# Patient Record
Sex: Female | Born: 1950
Health system: Southern US, Community
[De-identification: ages and names within clinical notes are randomized; demographics above are authoritative.]

## PROBLEM LIST (undated history)

## (undated) DIAGNOSIS — I10 Essential (primary) hypertension: Secondary | ICD-10-CM

## (undated) DIAGNOSIS — H409 Unspecified glaucoma: Secondary | ICD-10-CM

## (undated) DIAGNOSIS — E119 Type 2 diabetes mellitus without complications: Secondary | ICD-10-CM

## (undated) HISTORY — PX: ABDOMINAL HYSTERECTOMY: SHX81

## (undated) HISTORY — PX: THYROIDECTOMY: SHX17

## (undated) HISTORY — PX: GLAUCOMA SURGERY: SHX656

---

## 2013-06-11 ENCOUNTER — Ambulatory Visit: Payer: Self-pay

## 2014-05-01 ENCOUNTER — Encounter (HOSPITAL_COMMUNITY): Payer: Self-pay | Admitting: Emergency Medicine

## 2014-05-01 ENCOUNTER — Emergency Department (HOSPITAL_COMMUNITY)
Admission: EM | Admit: 2014-05-01 | Discharge: 2014-05-02 | Disposition: A | Discharge status: Home / Self Care | Payer: Self-pay | Attending: Emergency Medicine | Admitting: Emergency Medicine

## 2014-05-01 ENCOUNTER — Emergency Department (HOSPITAL_COMMUNITY): Payer: Self-pay

## 2014-05-01 DIAGNOSIS — J40 Bronchitis, not specified as acute or chronic: Secondary | ICD-10-CM

## 2014-05-01 DIAGNOSIS — J209 Acute bronchitis, unspecified: Secondary | ICD-10-CM | POA: Insufficient documentation

## 2014-05-01 DIAGNOSIS — R05 Cough: Secondary | ICD-10-CM

## 2014-05-01 DIAGNOSIS — R0602 Shortness of breath: Secondary | ICD-10-CM

## 2014-05-01 DIAGNOSIS — I10 Essential (primary) hypertension: Secondary | ICD-10-CM | POA: Insufficient documentation

## 2014-05-01 DIAGNOSIS — R059 Cough, unspecified: Secondary | ICD-10-CM

## 2014-05-01 DIAGNOSIS — Z8669 Personal history of other diseases of the nervous system and sense organs: Secondary | ICD-10-CM | POA: Insufficient documentation

## 2014-05-01 DIAGNOSIS — E119 Type 2 diabetes mellitus without complications: Secondary | ICD-10-CM | POA: Insufficient documentation

## 2014-05-01 HISTORY — DX: Type 2 diabetes mellitus without complications: E11.9

## 2014-05-01 HISTORY — DX: Essential (primary) hypertension: I10

## 2014-05-01 HISTORY — DX: Unspecified glaucoma: H40.9

## 2014-05-01 LAB — COMPREHENSIVE METABOLIC PANEL
ALT: 16 U/L (ref 0–35)
ANION GAP: 15 (ref 5–15)
AST: 23 U/L (ref 0–37)
Albumin: 4.1 g/dL (ref 3.5–5.2)
Alkaline Phosphatase: 74 U/L (ref 39–117)
BUN: 23 mg/dL (ref 6–23)
CALCIUM: 8.4 mg/dL (ref 8.4–10.5)
CHLORIDE: 101 mmol/L (ref 96–112)
CO2: 20 mmol/L (ref 19–32)
CREATININE: 1.08 mg/dL (ref 0.50–1.10)
GFR calc Af Amer: 61 mL/min — ABNORMAL LOW (ref >90)
GFR, EST NON AFRICAN AMERICAN: 53 mL/min — AB (ref >90)
Glucose, Bld: 108 mg/dL — ABNORMAL HIGH (ref 70–99)
POTASSIUM: 3.4 mmol/L — AB (ref 3.5–5.1)
SODIUM: 136 mmol/L (ref 135–145)
TOTAL PROTEIN: 8.7 g/dL — AB (ref 6.0–8.3)
Total Bilirubin: 0.5 mg/dL (ref 0.3–1.2)

## 2014-05-01 LAB — CBC WITH DIFFERENTIAL/PLATELET
Basophils Absolute: 0 10*3/uL (ref 0.0–0.1)
Basophils Relative: 0 % (ref 0–1)
Eosinophils Absolute: 0.1 10*3/uL (ref 0.0–0.7)
Eosinophils Relative: 1 % (ref 0–5)
HEMATOCRIT: 36.2 % (ref 36.0–46.0)
Hemoglobin: 12.5 g/dL (ref 12.0–15.0)
LYMPHS PCT: 23 % (ref 12–46)
Lymphs Abs: 1.8 10*3/uL (ref 0.7–4.0)
MCH: 31.3 pg (ref 26.0–34.0)
MCHC: 34.5 g/dL (ref 30.0–36.0)
MCV: 90.7 fL (ref 78.0–100.0)
MONO ABS: 0.5 10*3/uL (ref 0.1–1.0)
MONOS PCT: 7 % (ref 3–12)
Neutro Abs: 5.3 10*3/uL (ref 1.7–7.7)
Neutrophils Relative %: 69 % (ref 43–77)
Platelets: 241 10*3/uL (ref 150–400)
RBC: 3.99 MIL/uL (ref 3.87–5.11)
RDW: 13.3 % (ref 11.5–15.5)
WBC: 7.7 10*3/uL (ref 4.0–10.5)

## 2014-05-01 LAB — I-STAT TROPONIN, ED: Troponin i, poc: 0 ng/mL (ref 0.00–0.08)

## 2014-05-01 LAB — URINALYSIS, ROUTINE W REFLEX MICROSCOPIC
BILIRUBIN URINE: NEGATIVE
Glucose, UA: NEGATIVE mg/dL
KETONES UR: NEGATIVE mg/dL
NITRITE: NEGATIVE
PH: 5.5 (ref 5.0–8.0)
PROTEIN: NEGATIVE mg/dL
Specific Gravity, Urine: 1.007 (ref 1.005–1.030)
Urobilinogen, UA: 0.2 mg/dL (ref 0.0–1.0)

## 2014-05-01 LAB — D-DIMER, QUANTITATIVE (NOT AT ARMC): D-Dimer, Quant: 1.36 ug/mL-FEU — ABNORMAL HIGH (ref 0.00–0.48)

## 2014-05-01 LAB — URINE MICROSCOPIC-ADD ON

## 2014-05-01 MED ORDER — ACETAMINOPHEN 325 MG PO TABS
650.0000 mg | ORAL_TABLET | Freq: Once | ORAL | Status: AC
  Administered 2014-05-01: 650 mg via ORAL
  Filled 2014-05-01: qty 2

## 2014-05-01 NOTE — ED Provider Notes (Signed)
CSN: [id number]     Arrival date & time 05/01/14  1906 History   First MD Initiated Contact with Patient 05/01/14 2008     Chief Complaint  Patient presents with  . Cough  . Shortness of Breath     (Consider location/radiation/quality/duration/timing/severity/associated sxs/prior Treatment) HPI Comments: Patient is a 64 year old female with a past medical history of hypertension and diabetes who presents with chest pain that started yesterday morning. The symptoms started gradually and remained constant since the onset. The pain was sharp and severe and located in her left upper chest and right lower chest and did not radiate. Patient reports the pain lasted all day until she took Naprosyn which provided relief. She reports associated productive cough with white sputum and SOB. She reports the pain has improved today but her SOB has worsened. She denies fever or other associated symptoms. No history of blood clot. No recent travel, surgery, or immobilization. No aggravating/alleviating factors. Translator phone used to communicate with patient, she speaks Arabic.    Past Medical History  Diagnosis Date  . Hypertension   . Diabetes mellitus without complication   . Glaucoma    Past Surgical History  Procedure Laterality Date  . Glaucoma surgery    . Thyroidectomy     No family history on file. History  Substance Use Topics  . Smoking status: Never Smoker   . Smokeless tobacco: Not on file  . Alcohol Use: No   OB History    No data available     Review of Systems  Constitutional: Negative for fever, chills and fatigue.  HENT: Negative for trouble swallowing.   Eyes: Negative for visual disturbance.  Respiratory: Positive for cough and shortness of breath.   Cardiovascular: Positive for chest pain. Negative for palpitations.  Gastrointestinal: Negative for nausea, vomiting, abdominal pain and diarrhea.  Genitourinary: Negative for dysuria and difficulty urinating.   Musculoskeletal: Negative for arthralgias and neck pain.  Skin: Negative for color change.  Neurological: Negative for dizziness and weakness.  Psychiatric/Behavioral: Negative for dysphoric mood.      Allergies  Review of patient's allergies indicates no known allergies.  Home Medications   Prior to Admission medications   Not on File   BP 165/84 mmHg  Pulse 93  Temp(Src) 98.3 F (36.8 C) (Oral)  Resp 16  SpO2 99% Physical Exam  Constitutional: She is oriented to person, place, and time. She appears well-developed and well-nourished. No distress.  HENT:  Head: Normocephalic and atraumatic.  Eyes: Conjunctivae and EOM are normal.  Neck: Normal range of motion.  Cardiovascular: Normal rate and regular rhythm.  Exam reveals no gallop and no friction rub.   No murmur heard. No lower extremity edema or calf tenderness to palpation.   Pulmonary/Chest: Effort normal and breath sounds normal. She has no wheezes. She has no rales. She exhibits no tenderness.  Abdominal: Soft. She exhibits no distension. There is no tenderness. There is no rebound.  Musculoskeletal: Normal range of motion.  Neurological: She is alert and oriented to person, place, and time. Coordination normal.  Speech is goal-oriented. Moves limbs without ataxia.   Skin: Skin is warm and dry.  Psychiatric: She has a normal mood and affect. Her behavior is normal.  Nursing note and vitals reviewed.   ED Course  Procedures (including critical care time) Labs Review Labs Reviewed  COMPREHENSIVE METABOLIC PANEL - Abnormal; Notable for the following:    Potassium 3.4 (*)    Glucose, Bld 108 (*)  Total Protein 8.7 (*)    GFR calc non Af Amer 53 (*)    GFR calc Af Amer 61 (*)    All other components within normal limits  URINALYSIS, ROUTINE W REFLEX MICROSCOPIC - Abnormal; Notable for the following:    Hgb urine dipstick MODERATE (*)    Leukocytes, UA MODERATE (*)    All other components within normal  limits  D-DIMER, QUANTITATIVE - Abnormal; Notable for the following:    D-Dimer, Quant 1.36 (*)    All other components within normal limits  CBC WITH DIFFERENTIAL/PLATELET  URINE MICROSCOPIC-ADD ON  Rosezena SensorI-STAT TROPOININ, ED    Imaging Review Dg Chest 2 View  05/01/2014   CLINICAL DATA:  Two week history of cough. Two day history of shortness of breath. Left upper chest pain which began earlier today. Current history of diabetes.  EXAM: CHEST  2 VIEW  COMPARISON:  None.  FINDINGS: Cardiomediastinal silhouette unremarkable. Lungs clear. Bronchovascular markings normal. Pulmonary vascularity normal. No visible pleural effusions. No pneumothorax. Mild degenerative changes involving the thoracic spine. Eventration of the right anterior hemidiaphragm.  IMPRESSION: No acute cardiopulmonary disease.   Electronically Signed   By: Hulan Saashomas  Lawrence M.D.   On: 05/01/2014 21:23   Ct Angio Chest Pe W/cm &/or Wo Cm  05/02/2014   CLINICAL DATA:  Acute onset of shortness of breath and productive cough. Initial encounter.  EXAM: CT ANGIOGRAPHY CHEST WITH CONTRAST  TECHNIQUE: Multidetector CT imaging of the chest was performed using the standard protocol during bolus administration of intravenous contrast. Multiplanar CT image reconstructions and MIPs were obtained to evaluate the vascular anatomy.  CONTRAST:  100mL OMNIPAQUE IOHEXOL 350 MG/ML SOLN  COMPARISON:  Chest radiograph performed performed earlier today at 8:44 p.m.  FINDINGS: There is no evidence of pulmonary embolus.  Minimal bibasilar atelectasis is noted. The lungs are otherwise clear. There is no evidence of significant focal consolidation, pleural effusion or pneumothorax. No masses are identified; no abnormal focal contrast enhancement is seen.  The mediastinum is unremarkable in appearance. No mediastinal lymphadenopathy is seen. No pericardial effusions identified. The great vessels are grossly unremarkable in appearance. No axillary lymphadenopathy is  seen. The visualized thyroid gland is unremarkable in appearance.  A tiny hiatal hernia is noted.  The liver and spleen are unremarkable. The visualized portions of the pancreas, gallbladder, stomach, adrenal glands and kidneys are within normal limits.  No acute osseous abnormalities are seen.  Review of the MIP images confirms the above findings.  IMPRESSION: 1. No evidence of pulmonary embolus. 2. Minimal bibasilar atelectasis noted; lungs otherwise clear. 3. Tiny hiatal hernia noted.   Electronically Signed   By: Roanna RaiderJeffery  Chang M.D.   On: 05/02/2014 01:08     EKG Interpretation None      MDM   Final diagnoses:  Bronchitis    8:39 PM Labs and xray pending. Vitals stable and patient afebrile. Patient consistently tachycardic while I was in the room, although her EKG shows rate of 94.   1:29 AM Patient's CT chest shows no PE. Patient will be treated for bronchitis due to age. I doubt ACS based on the patient's history. Vitals stable and patient afebrile. No further evaluation needed at this time.   Emilia BeckKaitlyn Jetaime Pinnix, PA-C 05/02/14 0129  Blake DivineJohn Wofford, MD 05/03/14 [phone]

## 2014-05-01 NOTE — ED Notes (Signed)
Spoke with burt with CT, patient has IV placed, ready for transport.

## 2014-05-01 NOTE — ED Notes (Signed)
Kaitlyn, PA-C, at the bedside.  

## 2014-05-01 NOTE — ED Notes (Signed)
Phlebotomy at the bedside  

## 2014-05-01 NOTE — ED Notes (Signed)
Pt. reports SOB with productive cough onset this week , denies fever or chills.

## 2014-05-01 NOTE — ED Notes (Signed)
Kaitlyn, PA-C, allows patient to take calcium pill from home if they request. Relayed to the patient, still awaiting CT scan.

## 2014-05-01 NOTE — ED Notes (Signed)
Interpreter line used, patient speaks arabic and is from [place].

## 2014-05-01 NOTE — ED Notes (Signed)
Pt to xray

## 2014-05-02 ENCOUNTER — Encounter (HOSPITAL_COMMUNITY): Payer: Self-pay | Admitting: Radiology

## 2014-05-02 ENCOUNTER — Emergency Department (HOSPITAL_COMMUNITY): Payer: Self-pay

## 2014-05-02 MED ORDER — IOHEXOL 350 MG/ML SOLN
100.0000 mL | Freq: Once | INTRAVENOUS | Status: AC | PRN
  Administered 2014-05-02: 100 mL via INTRAVENOUS

## 2014-05-02 MED ORDER — DEXTROMETHORPHAN POLISTIREX ER 30 MG/5ML PO SUER: 30.0000 mg | ORAL | Status: DC | PRN

## 2014-05-02 MED ORDER — AZITHROMYCIN 250 MG PO TABS: 250.0000 mg | ORAL_TABLET | Freq: Every day | ORAL | Status: DC

## 2014-05-02 NOTE — Discharge Instructions (Signed)
Take azithromycin as directed until gone. Take delsym as needed for cough. Refer to attached documents for more information.  °

## 2014-05-02 NOTE — ED Notes (Signed)
Called Ct, spoke to burt, patient is next for transport to Ct.

## 2014-10-16 ENCOUNTER — Emergency Department (HOSPITAL_COMMUNITY)
Admission: EM | Admit: 2014-10-16 | Discharge: 2014-10-16 | Disposition: A | Discharge status: Home / Self Care | Payer: No Typology Code available for payment source | Source: Home / Self Care

## 2014-10-16 ENCOUNTER — Encounter (HOSPITAL_COMMUNITY): Payer: Self-pay | Admitting: *Deleted

## 2014-10-16 DIAGNOSIS — E034 Atrophy of thyroid (acquired): Secondary | ICD-10-CM

## 2014-10-16 DIAGNOSIS — E038 Other specified hypothyroidism: Secondary | ICD-10-CM

## 2014-10-16 LAB — POCT I-STAT, CHEM 8
BUN: 24 mg/dL — AB (ref 6–20)
Calcium, Ion: 0.64 mmol/L — CL (ref 1.13–1.30)
Chloride: 101 mmol/L (ref 101–111)
Creatinine, Ser: 1 mg/dL (ref 0.44–1.00)
Glucose, Bld: 137 mg/dL — ABNORMAL HIGH (ref 65–99)
HEMATOCRIT: 43 % (ref 36.0–46.0)
Hemoglobin: 14.6 g/dL (ref 12.0–15.0)
POTASSIUM: 3.7 mmol/L (ref 3.5–5.1)
Sodium: 138 mmol/L (ref 135–145)
TCO2: 22 mmol/L (ref 0–100)

## 2014-10-16 LAB — POCT URINALYSIS DIP (DEVICE)
BILIRUBIN URINE: NEGATIVE
GLUCOSE, UA: NEGATIVE mg/dL
Ketones, ur: NEGATIVE mg/dL
Nitrite: NEGATIVE
Protein, ur: NEGATIVE mg/dL
Specific Gravity, Urine: 1.01 (ref 1.005–1.030)
UROBILINOGEN UA: 0.2 mg/dL (ref 0.0–1.0)
pH: 7 (ref 5.0–8.0)

## 2014-10-16 NOTE — Discharge Instructions (Signed)

## 2014-10-16 NOTE — ED Notes (Signed)
Pt   Reports  Symptoms of   weakness   Discomfort  On urination    Weakness    With  Symptoms  Since  Yesterday          Pt is  A  Diabetic  Takes  meds  For  Same

## 2014-10-16 NOTE — ED Notes (Signed)
Patient can't void at this time 

## 2014-10-16 NOTE — ED Provider Notes (Signed)
CSN: [id number]     Arrival date & time 10/16/14  1408 History   None    Chief Complaint  Patient presents with  . Fatigue   (Consider location/radiation/quality/duration/timing/severity/associated sxs/prior Treatment) HPI Comments: 64 year old [place] female presents to urgent care complaining of fatigue. She has had gradually decreasing energy for the last 2 weeks. Beginning at that time she had been treated for urinary tract infection after which she felt very tired. Dysuria returned and she was seen at another urgent care facility a few days ago at which time she was prescribed Cipro for presumed urinary tract infection. However urine culture growth was negative and she was instructed to stop taking her antibiotics. Now she complains of body aches and extreme fatigue. She denies seeing blood in her urine. She admits to increased urinary frequency.  The history is provided by a friend. The history is limited by a language barrier. A language interpreter was used (Friend speaks [place] (daughter-in-law?)).    Past Medical History  Diagnosis Date  . Hypertension   . Diabetes mellitus without complication (HCC)   . Glaucoma    Past Surgical History  Procedure Laterality Date  . Glaucoma surgery    . Thyroidectomy     History reviewed. No pertinent family history. Social History  Substance Use Topics  . Smoking status: Never Smoker   . Smokeless tobacco: None  . Alcohol Use: No   OB History    No data available     Review of Systems  Constitutional: Positive for fatigue. Negative for fever.  Respiratory: Negative for cough and shortness of breath.   Cardiovascular: Negative for chest pain.  Gastrointestinal: Positive for nausea and abdominal pain. Negative for vomiting.  Genitourinary: Positive for dysuria and frequency.  Musculoskeletal: Positive for myalgias.    Allergies  Review of patient's allergies indicates no known allergies.  Home Medications   Prior to  Admission medications   Medication Sig Start Date End Date Taking? Authorizing Provider  amLODipine (NORVASC) 2.5 MG tablet Take 2.5 mg by mouth daily.    Historical Provider, MD  azithromycin (ZITHROMAX Z-PAK) 250 MG tablet Take 1 tablet (250 mg total) by mouth daily. 500mg  PO day 1, then 250mg  PO days 205 05/02/14   Kaitlyn Szekalski, PA-C  brimonidine (ALPHAGAN) 0.2 % ophthalmic solution Place 1 drop into both eyes 3 (three) times daily.    Historical Provider, MD  dextromethorphan (DELSYM) 30 MG/5ML liquid Take 5 mLs (30 mg total) by mouth as needed for cough. 05/02/14   Kaitlyn Szekalski, PA-C  dorzolamide-timolol (COSOPT) 22.3-6.8 MG/ML ophthalmic solution Place 1 drop into both eyes 2 (two) times daily.    Historical Provider, MD  glimepiride (AMARYL) 2 MG tablet Take 2 mg by mouth 2 (two) times daily.    Historical Provider, MD  levothyroxine (SYNTHROID, LEVOTHROID) 100 MCG tablet Take 100 mcg by mouth daily before breakfast.    Historical Provider, MD  metFORMIN (GLUCOPHAGE) 500 MG tablet Take by mouth 2 (two) times daily with a meal.    Historical Provider, MD  naproxen (NAPROSYN) 500 MG tablet Take 500 mg by mouth 2 (two) times daily as needed (with food).    Historical Provider, MD  NON FORMULARY Take 1 tablet by mouth 2 (two) times daily. Type of Calcium not from our country    Historical Provider, MD  NON FORMULARY Take 1 tablet by mouth daily. Osteo-Alfa 0.25  Alfacalcidol     Not from our country    Historical Provider, MD  Meds Ordered and Administered this Visit  Medications - No data to display  BP 137/83 mmHg  Pulse 88  Temp(Src) 98.5 F (36.9 C) (Oral)  Resp 16  SpO2 98% No data found.   Physical Exam  Constitutional: She is oriented to person, place, and time. She appears well-developed and well-nourished. No distress.  HENT:  Head: Normocephalic and atraumatic.  Mouth/Throat: Oropharynx is clear and moist.  Eyes: Conjunctivae and EOM are normal. Pupils are equal,  round, and reactive to light. No scleral icterus.  Neck: Normal range of motion. Neck supple. No thyromegaly (well-healed anterior neck scar above cricroid notch) present.  Cardiovascular: Normal rate, regular rhythm and normal heart sounds.  Exam reveals no gallop and no friction rub.   No murmur heard. Pulmonary/Chest: Effort normal and breath sounds normal.  Abdominal: Soft. Bowel sounds are normal. She exhibits no distension. There is no tenderness.  Musculoskeletal: Normal range of motion. She exhibits edema.  Lymphadenopathy:    She has no cervical adenopathy.  Neurological: She is alert and oriented to person, place, and time.  Skin: Skin is warm and dry. No rash noted. No erythema.  Psychiatric: She has a normal mood and affect. Her behavior is normal. Judgment and thought content normal.    ED Course  Procedures (including critical care time)  Labs Review Labs Reviewed  POCT URINALYSIS DIP (DEVICE) - Abnormal; Notable for the following:    Hgb urine dipstick MODERATE (*)    Leukocytes, UA TRACE (*)    All other components within normal limits  POCT I-STAT, CHEM 8 - Abnormal; Notable for the following:    BUN 24 (*)    Glucose, Bld 137 (*)    Calcium, Ion 0.64 (*)    All other components within normal limits    Imaging Review No results found.   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 blood sugar is normal per chem 8. No physical indication of anemia. (G6PD exacerbation following Bactrim per the differential as there are a few blood cells in urine over there is no indication of bilirubin in urine suggesting hemolysis). Most likely etiology is hypothyroidism. Patient reports compliance with Synthroid  however she could have a change in thyroglobulin or total albumin. The patient reports that she has an appointment with her primary care doctor tomorrow. It appears the office is [facility]. I will hand the patient contact information for care management here at urgent care to follow-up. Discharge instructions to assess thyroid function.    Arnaldo Natal, MD 10/16/14 [phone]

## 2014-10-30 ENCOUNTER — Emergency Department (HOSPITAL_COMMUNITY): Payer: No Typology Code available for payment source

## 2014-10-30 ENCOUNTER — Emergency Department (HOSPITAL_COMMUNITY)
Admission: EM | Admit: 2014-10-30 | Discharge: 2014-10-30 | Disposition: A | Discharge status: Home / Self Care | Payer: Self-pay | Attending: Emergency Medicine | Admitting: Emergency Medicine

## 2014-10-30 ENCOUNTER — Encounter (HOSPITAL_COMMUNITY): Payer: Self-pay | Admitting: *Deleted

## 2014-10-30 DIAGNOSIS — Z79899 Other long term (current) drug therapy: Secondary | ICD-10-CM | POA: Insufficient documentation

## 2014-10-30 DIAGNOSIS — R109 Unspecified abdominal pain: Secondary | ICD-10-CM

## 2014-10-30 DIAGNOSIS — E119 Type 2 diabetes mellitus without complications: Secondary | ICD-10-CM | POA: Insufficient documentation

## 2014-10-30 DIAGNOSIS — Z87440 Personal history of urinary (tract) infections: Secondary | ICD-10-CM | POA: Insufficient documentation

## 2014-10-30 DIAGNOSIS — R1030 Lower abdominal pain, unspecified: Secondary | ICD-10-CM | POA: Insufficient documentation

## 2014-10-30 DIAGNOSIS — Z8669 Personal history of other diseases of the nervous system and sense organs: Secondary | ICD-10-CM | POA: Insufficient documentation

## 2014-10-30 DIAGNOSIS — I10 Essential (primary) hypertension: Secondary | ICD-10-CM | POA: Insufficient documentation

## 2014-10-30 DIAGNOSIS — R300 Dysuria: Secondary | ICD-10-CM | POA: Insufficient documentation

## 2014-10-30 DIAGNOSIS — Z7984 Long term (current) use of oral hypoglycemic drugs: Secondary | ICD-10-CM | POA: Insufficient documentation

## 2014-10-30 LAB — URINALYSIS, ROUTINE W REFLEX MICROSCOPIC
Bilirubin Urine: NEGATIVE
GLUCOSE, UA: NEGATIVE mg/dL
Ketones, ur: NEGATIVE mg/dL
Nitrite: NEGATIVE
PH: 7 (ref 5.0–8.0)
Protein, ur: NEGATIVE mg/dL
SPECIFIC GRAVITY, URINE: 1.004 — AB (ref 1.005–1.030)
UROBILINOGEN UA: 0.2 mg/dL (ref 0.0–1.0)

## 2014-10-30 LAB — COMPREHENSIVE METABOLIC PANEL
ALBUMIN: 3.4 g/dL — AB (ref 3.5–5.0)
ALK PHOS: 77 U/L (ref 38–126)
ALT: 14 U/L (ref 14–54)
AST: 23 U/L (ref 15–41)
Anion gap: 14 (ref 5–15)
BILIRUBIN TOTAL: 0.6 mg/dL (ref 0.3–1.2)
BUN: 19 mg/dL (ref 6–20)
CALCIUM: 5.9 mg/dL — AB (ref 8.9–10.3)
CO2: 23 mmol/L (ref 22–32)
CREATININE: 0.92 mg/dL (ref 0.44–1.00)
Chloride: 101 mmol/L (ref 101–111)
GFR calc Af Amer: >60 mL/min (ref >60)
GLUCOSE: 157 mg/dL — AB (ref 65–99)
POTASSIUM: 4.2 mmol/L (ref 3.5–5.1)
Sodium: 138 mmol/L (ref 135–145)
TOTAL PROTEIN: 8 g/dL (ref 6.5–8.1)

## 2014-10-30 LAB — URINE MICROSCOPIC-ADD ON

## 2014-10-30 LAB — CBC
HEMATOCRIT: 38.5 % (ref 36.0–46.0)
HEMOGLOBIN: 13.1 g/dL (ref 12.0–15.0)
MCH: 31.8 pg (ref 26.0–34.0)
MCHC: 34 g/dL (ref 30.0–36.0)
MCV: 93.4 fL (ref 78.0–100.0)
Platelets: 249 10*3/uL (ref 150–400)
RBC: 4.12 MIL/uL (ref 3.87–5.11)
RDW: 13.5 % (ref 11.5–15.5)
WBC: 6.7 10*3/uL (ref 4.0–10.5)

## 2014-10-30 LAB — LIPASE, BLOOD: LIPASE: 28 U/L (ref 11–51)

## 2014-10-30 LAB — CBG MONITORING, ED: GLUCOSE-CAPILLARY: 91 mg/dL (ref 65–99)

## 2014-10-30 MED ORDER — IOHEXOL 300 MG/ML  SOLN
100.0000 mL | Freq: Once | INTRAMUSCULAR | Status: AC | PRN
  Administered 2014-10-30: 100 mL via INTRAVENOUS

## 2014-10-30 MED ORDER — MORPHINE SULFATE (PF) 4 MG/ML IV SOLN
4.0000 mg | Freq: Once | INTRAVENOUS | Status: AC
  Administered 2014-10-30: 4 mg via INTRAVENOUS
  Filled 2014-10-30: qty 1

## 2014-10-30 NOTE — ED Notes (Signed)
Interpretor phone used. Arabic, [place]. Patient with intermittent lower abdominal pain x 2 weeks associated with fevers, headache, and painful urination.

## 2014-10-30 NOTE — ED Notes (Signed)
MD at bedside. 

## 2014-10-30 NOTE — ED Provider Notes (Signed)
  Physical Exam  BP 127/82 mmHg  Pulse 78  Temp(Src) 98.9 F (37.2 C) (Oral)  Resp 14  SpO2 99%  Physical Exam  ED Course  Procedures  MDM Patients CT is negative within limits. Patient has tolerated orals and feels somewhat better. Will discharge home.      Norma CoreNathan Amaree Leeper, MD 10/30/14 [phone]

## 2014-10-30 NOTE — ED Provider Notes (Signed)
CSN: [id number]     Arrival date & time 10/30/14  1235 History   First MD Initiated Contact with Patient 10/30/14 1531     Chief Complaint  Patient presents with  . Abdominal Pain  . Dysuria   patient speaks no [place]. History is obtained from [employer] using a professional interpreter (Consider location/radiation/quality/duration/timing/severity/associated sxs/prior Treatment) HPI Complains of left flank pain radiating to the lower abdomen onset one month ago. She was treated for urinary tract infection with a sulfa antibiotic, without relief. She occasionally takes ibuprofen with partial relief. She denies f She denies fever denies nausea or vomiting. Nothing makes symptoms better or worse however she states pain "comes and goes. Pain is moderate at present. Other associated symptoms :She does admit to dysuria denies headache no other associated symptoms Past Medical History  Diagnosis Date  . Hypertension   . Diabetes mellitus without complication (HCC)   . Glaucoma    Past Surgical History  Procedure Laterality Date  . Glaucoma surgery    . Thyroidectomy     History reviewed. No pertinent family history. Social History  Substance Use Topics  . Smoking status: Never Smoker   . Smokeless tobacco: None  . Alcohol Use: No   OB History    No data available     Review of Systems  Constitutional: Negative.   HENT: Negative.   Respiratory: Negative.   Cardiovascular: Negative.   Gastrointestinal: Positive for abdominal pain.  Genitourinary: Positive for flank pain.  Musculoskeletal: Negative.   Skin: Negative.   Neurological: Negative.   Psychiatric/Behavioral: Negative.   All other systems reviewed and are negative.     Allergies  Review of patient's allergies indicates no known allergies.  Home Medications   Prior to Admission medications   Medication Sig Start Date End Date Taking? Authorizing Provider  amLODipine (NORVASC) 2.5 MG tablet Take 2.5  mg by mouth daily.   Yes Historical Provider, MD  brimonidine (ALPHAGAN) 0.2 % ophthalmic solution Place 1 drop into both eyes 3 (three) times daily.   Yes Historical Provider, MD  dorzolamide-timolol (COSOPT) 22.3-6.8 MG/ML ophthalmic solution Place 1 drop into both eyes 2 (two) times daily.   Yes Historical Provider, MD  glimepiride (AMARYL) 2 MG tablet Take 2 mg by mouth 2 (two) times daily.   Yes Historical Provider, MD  levothyroxine (SYNTHROID, LEVOTHROID) 100 MCG tablet Take 100 mcg by mouth daily before breakfast.   Yes Historical Provider, MD  NON FORMULARY Take 1 tablet by mouth 2 (two) times daily. Type of Calcium not from our country   Yes Historical Provider, MD  NON FORMULARY Take 1 tablet by mouth daily. Osteo-Alfa 0.25  Alfacalcidol     Not from our country   Yes Historical Provider, MD  azithromycin (ZITHROMAX Z-PAK) 250 MG tablet Take 1 tablet (250 mg total) by mouth daily.  PO day 1, then  PO days 205 Patient not taking: Reported on 10/30/2014 05/02/14   Emilia Beck, PA-C  dextromethorphan (DELSYM) 30 MG/5ML liquid Take 5 mLs (30 mg total) by mouth as needed for cough. Patient not taking: Reported on 10/30/2014 05/02/14   Emilia Beck, PA-C  naproxen (NAPROSYN) 500 MG tablet Take 500 mg by mouth 2 (two) times daily as needed (with food).    Historical Provider, MD   BP 134/81 mmHg  Pulse 81  Temp(Src) 98.9 F (37.2 C) (Oral)  Resp 16  SpO2 98% Physical Exam  Constitutional: She appears well-developed and well-nourished. No distress.  HENT:  Head: Normocephalic and atraumatic.  Eyes: Conjunctivae are normal. Pupils are equal, round, and reactive to light.  Neck: Neck supple. No tracheal deviation present. No thyromegaly present.  Cardiovascular: Normal rate and regular rhythm.   No murmur heard. Pulmonary/Chest: Effort normal and breath sounds normal.  Abdominal: Soft. Bowel sounds are normal. She exhibits no distension. There is no tenderness.   Genitourinary:  Left flank tenderness  Musculoskeletal: Normal range of motion. She exhibits no edema or tenderness.  Neurological: She is alert. Coordination normal.  Skin: Skin is warm and dry. No rash noted.  Psychiatric: She has a normal mood and affect.  Nursing note and vitals reviewed.   ED Course  Procedures (including critical care time) Labs Review Labs Reviewed  COMPREHENSIVE METABOLIC PANEL - Abnormal; Notable for the following:    Glucose, Bld 157 (*)    Calcium 5.9 (*)    Albumin 3.4 (*)    All other components within normal limits  URINALYSIS, ROUTINE W REFLEX MICROSCOPIC (NOT AT [facility]) - Abnormal; Notable for the following:    Specific Gravity, Urine 1.004 (*)    Hgb urine dipstick MODERATE (*)    Leukocytes, UA TRACE (*)    All other components within normal limits  URINE CULTURE  CBC  LIPASE, BLOOD  URINE MICROSCOPIC-ADD ON    Imaging Review No results found. I have personally reviewed and evaluated these images and lab results as part of my medical decision-making.   EKG Interpretation None     5:20 PM pain is improved after treatment with intravenous morphine.\ Results for orders placed or performed during the hospital encounter of 10/30/14  Comprehensive metabolic panel  Result Value Ref Range   Sodium 138 135 - 145 mmol/L   Potassium 4.2 3.5 - 5.1 mmol/L   Chloride 101 101 - 111 mmol/L   CO2 23 22 - 32 mmol/L   Glucose, Bld 157 (H) 65 - 99 mg/dL   BUN 19 6 - 20 mg/dL   Creatinine, Ser 1.610.92 0.44 - 1.00 mg/dL   Calcium 5.9 (LL) 8.9 - 10.3 mg/dL   Total Protein 8.0 6.5 - 8.1 g/dL   Albumin 3.4 (L) 3.5 - 5.0 g/dL   AST 23 15 - 41 U/L   ALT 14 14 - 54 U/L   Alkaline Phosphatase 77 38 - 126 U/L   Total Bilirubin 0.6 0.3 - 1.2 mg/dL   GFR calc non Af Amer >60 >60 mL/min   GFR calc Af Amer >60 >60 mL/min   Anion gap 14 5 - 15  CBC  Result Value Ref Range   WBC 6.7 4.0 - 10.5 K/uL   RBC 4.12 3.87 - 5.11 MIL/uL   Hemoglobin 13.1 12.0 - 15.0  g/dL   HCT 09.638.5 04.536.0 - 40.946.0 %   MCV 93.4 78.0 - 100.0 fL   MCH 31.8 26.0 - 34.0 pg   MCHC 34.0 30.0 - 36.0 g/dL   RDW 81.113.5 91.411.5 - 78.215.5 %   Platelets 249 150 - 400 K/uL  Urinalysis, Routine w reflex microscopic (not at [facility])  Result Value Ref Range   Color, Urine YELLOW YELLOW   APPearance CLEAR CLEAR   Specific Gravity, Urine 1.004 (L) 1.005 - 1.030   pH 7.0 5.0 - 8.0   Glucose, UA NEGATIVE NEGATIVE mg/dL   Hgb urine dipstick MODERATE (A) NEGATIVE   Bilirubin Urine NEGATIVE NEGATIVE   Ketones, ur NEGATIVE NEGATIVE mg/dL   Protein, ur NEGATIVE NEGATIVE mg/dL   Urobilinogen, UA 0.2 0.0 -  1.0 mg/dL   Nitrite NEGATIVE NEGATIVE   Leukocytes, UA TRACE (A) NEGATIVE  Lipase, blood  Result Value Ref Range   Lipase 28 11 - 51 U/L  Urine microscopic-add on  Result Value Ref Range   Squamous Epithelial / LPF RARE RARE   WBC, UA 0-2 <3 WBC/hpf   RBC / HPF 3-6 <3 RBC/hpf  CBG monitoring, ED  Result Value Ref Range   Glucose-Capillary 91 65 - 99 mg/dL   No results found.  MDM  Patient signed out to Dr. Rubin Payor 5:25 PM Dx #1 left flank pain #2 low abdominal pain #3 dysuria #4 microscopic hematuria #5 hyperglycemia Final diagnoses:  None        Doug Sou, MD 10/30/14 1730

## 2014-10-30 NOTE — Discharge Instructions (Signed)
Flank Pain °Flank pain refers to pain that is located on the side of the body between the upper abdomen and the back. The pain may occur over a short period of time (acute) or may be long-term or reoccurring (chronic). It may be mild or severe. Flank pain can be caused by many things. °CAUSES  °Some of the more common causes of flank pain include: °· Muscle strains.   °· Muscle spasms.   °· A disease of your spine (vertebral disk disease).   °· A lung infection (pneumonia).   °· Fluid around your lungs (pulmonary edema).   °· A kidney infection.   °· Kidney stones.   °· A very painful skin rash caused by the chickenpox virus (shingles).   °· Gallbladder disease.   °HOME CARE INSTRUCTIONS  °Home care will depend on the cause of your pain. In general, °· Rest as directed by your caregiver. °· Drink enough fluids to keep your urine clear or pale yellow. °· Only take over-the-counter or prescription medicines as directed by your caregiver. Some medicines may help relieve the pain. °· Tell your caregiver about any changes in your pain. °· Follow up with your caregiver as directed. °SEEK IMMEDIATE MEDICAL CARE IF:  °· Your pain is not controlled with medicine.   °· You have new or worsening symptoms. °· Your pain increases.   °· You have abdominal pain.   °· You have shortness of breath.   °· You have persistent nausea or vomiting.   °· You have swelling in your abdomen.   °· You feel faint or pass out.   °· You have blood in your urine. °· You have a fever or persistent symptoms for more than 2-3 days. °· You have a fever and your symptoms suddenly get worse. °MAKE SURE YOU:  °· Understand these instructions. °· Will watch your condition. °· Will get help right away if you are not doing well or get worse. °  °This information is not intended to replace advice given to you by your health care provider. Make sure you discuss any questions you have with your health care provider. °  °Document Released: 02/10/2005 Document  Revised: 09/14/2011 Document Reviewed: 08/04/2011 °Elsevier Interactive Patient Education ©2016 Elsevier Inc. ° °

## 2014-11-01 LAB — URINE CULTURE: SPECIAL REQUESTS: NORMAL

## 2014-12-05 ENCOUNTER — Ambulatory Visit: Payer: No Typology Code available for payment source

## 2016-03-15 ENCOUNTER — Ambulatory Visit: Payer: Self-pay | Attending: Internal Medicine

## 2016-04-18 ENCOUNTER — Ambulatory Visit: Payer: Self-pay | Attending: Family Medicine | Admitting: Family Medicine

## 2016-04-18 VITALS — BP 151/92 | HR 77 | Temp 98.2°F | Resp 18 | Ht 67.0 in | Wt 185.0 lb

## 2016-04-18 DIAGNOSIS — E039 Hypothyroidism, unspecified: Secondary | ICD-10-CM | POA: Insufficient documentation

## 2016-04-18 DIAGNOSIS — R300 Dysuria: Secondary | ICD-10-CM

## 2016-04-18 DIAGNOSIS — Z23 Encounter for immunization: Secondary | ICD-10-CM

## 2016-04-18 DIAGNOSIS — M25551 Pain in right hip: Secondary | ICD-10-CM | POA: Insufficient documentation

## 2016-04-18 DIAGNOSIS — Z9181 History of falling: Secondary | ICD-10-CM

## 2016-04-18 DIAGNOSIS — I10 Essential (primary) hypertension: Secondary | ICD-10-CM | POA: Insufficient documentation

## 2016-04-18 DIAGNOSIS — Z7984 Long term (current) use of oral hypoglycemic drugs: Secondary | ICD-10-CM | POA: Insufficient documentation

## 2016-04-18 DIAGNOSIS — E119 Type 2 diabetes mellitus without complications: Secondary | ICD-10-CM | POA: Insufficient documentation

## 2016-04-18 DIAGNOSIS — M2540 Effusion, unspecified joint: Secondary | ICD-10-CM

## 2016-04-18 DIAGNOSIS — N3001 Acute cystitis with hematuria: Secondary | ICD-10-CM | POA: Insufficient documentation

## 2016-04-18 DIAGNOSIS — Z0000 Encounter for general adult medical examination without abnormal findings: Secondary | ICD-10-CM

## 2016-04-18 DIAGNOSIS — Z8669 Personal history of other diseases of the nervous system and sense organs: Secondary | ICD-10-CM

## 2016-04-18 DIAGNOSIS — Z79899 Other long term (current) drug therapy: Secondary | ICD-10-CM | POA: Insufficient documentation

## 2016-04-18 DIAGNOSIS — H409 Unspecified glaucoma: Secondary | ICD-10-CM | POA: Insufficient documentation

## 2016-04-18 DIAGNOSIS — G8929 Other chronic pain: Secondary | ICD-10-CM | POA: Insufficient documentation

## 2016-04-18 LAB — POCT URINALYSIS DIPSTICK
Bilirubin, UA: NEGATIVE
Glucose, UA: NEGATIVE
Ketones, UA: NEGATIVE
Nitrite, UA: NEGATIVE
PROTEIN UA: NEGATIVE
UROBILINOGEN UA: 0.2 U/dL
pH, UA: 6 (ref 5.0–8.0)

## 2016-04-18 LAB — GLUCOSE, POCT (MANUAL RESULT ENTRY): POC Glucose: 136 mg/dl — AB (ref 70–99)

## 2016-04-18 LAB — POCT GLYCOSYLATED HEMOGLOBIN (HGB A1C): Hemoglobin A1C: 8.6

## 2016-04-18 MED ORDER — AMLODIPINE BESYLATE 10 MG PO TABS: 10.0000 mg | ORAL_TABLET | Freq: Every day | ORAL | 0 refills | Status: DC

## 2016-04-18 MED ORDER — DORZOLAMIDE HCL-TIMOLOL MAL 2-0.5 % OP SOLN: 1.0000 [drp] | Freq: Two times a day (BID) | OPHTHALMIC | 0 refills | Status: DC

## 2016-04-18 MED ORDER — PNEUMOCOCCAL 13-VAL CONJ VACC IM SUSP
0.5000 mL | Freq: Once | INTRAMUSCULAR | Status: AC
  Administered 2016-04-18: 0.5 mL via INTRAMUSCULAR

## 2016-04-18 MED ORDER — TRUEPLUS LANCETS 28G MISC: 1.0000 | Freq: Once | 12 refills | Status: AC

## 2016-04-18 MED ORDER — NITROFURANTOIN MONOHYD MACRO 100 MG PO CAPS: 100.0000 mg | ORAL_CAPSULE | Freq: Two times a day (BID) | ORAL | 0 refills | Status: DC

## 2016-04-18 MED ORDER — TRUE METRIX METER W/DEVICE KIT: 1.0000 | PACK | Freq: Once | 0 refills | Status: AC

## 2016-04-18 MED ORDER — NAPROXEN 500 MG PO TABS: 500.0000 mg | ORAL_TABLET | Freq: Two times a day (BID) | ORAL | 0 refills | Status: DC

## 2016-04-18 MED ORDER — GLUCOSE BLOOD VI STRP: ORAL_STRIP | 12 refills | Status: DC

## 2016-04-18 MED ORDER — GLIMEPIRIDE 4 MG PO TABS: 4.0000 mg | ORAL_TABLET | Freq: Every day | ORAL | 0 refills | Status: DC

## 2016-04-18 MED ORDER — LOSARTAN POTASSIUM 25 MG PO TABS: 25.0000 mg | ORAL_TABLET | Freq: Every day | ORAL | 0 refills | Status: DC

## 2016-04-18 MED ORDER — METFORMIN HCL 1000 MG PO TABS: 1000.0000 mg | ORAL_TABLET | Freq: Two times a day (BID) | ORAL | 0 refills | Status: DC

## 2016-04-18 MED ORDER — PNEUMOCOCCAL 13-VAL CONJ VACC IM SUSP: 0.5000 mL | Freq: Once | INTRAMUSCULAR | 0 refills | Status: DC

## 2016-04-18 MED FILL — AMLODIPINE BESYLATE 10 MG T: 10 | 30 days supply | Qty: 30 | Fill #0

## 2016-04-18 MED FILL — ?METFORMIN HCL 1,000 MG TAB: 1000 | 30 days supply | Qty: 60 | Fill #0

## 2016-04-18 MED FILL — !TRUE METRIX BLOOD GLUCOSE: 365 days supply | Qty: 1 | Fill #0

## 2016-04-18 MED FILL — TRUE METRIX TEST STRIP: 33 days supply | Qty: 100 | Fill #0

## 2016-04-18 MED FILL — LOSARTAN POTASSIUM 25 MG TA: 25 | 30 days supply | Qty: 30 | Fill #0

## 2016-04-18 MED FILL — NAPROXEN 500 MG TABLET: 500 | 30 days supply | Qty: 60 | Fill #0

## 2016-04-18 MED FILL — GLIMEPIRIDE 4 MG TABLET: 4 | 30 days supply | Qty: 30 | Fill #0

## 2016-04-18 MED FILL — TRUEplus LANCETS 28G MISC: 33 days supply | Qty: 100 | Fill #0

## 2016-04-18 MED FILL — NITROFURANTOIN MONO-MCR 100: 100 | 5 days supply | Qty: 10 | Fill #0

## 2016-04-18 NOTE — Progress Notes (Signed)
Subjective:  Patient ID: Norma Clay, female    DOB: [birth year]  Age: 66 y.o. MRN: [id number]  CC: Establish Care   HPI Norma Clay presents for    DM: Patient presents to establish care medical history includes diabetes, HTN, hypothyroidism and glaucoma. She is accompanied by her son who translates for her. Reports being under the care of medical provider in [place] before coming to the [place]. She bring current medications with her into the office. Not on insulin for DM. Denies taking CBG regularly at home. Denies any wounds, paresthesias, N/V, or abdominal pain. Denies taking BP regularly at home. Denies any CP, SOB, swelling of the BLE, headaches, or dizziness. Currently taking medication for hypothyroidism denies any associated symptoms. Reports history of left eye cataract removal 20 years ago and right eye cataract removal 7 years ago. Reports being diagnosed with glaucoma 5 years ago. Denies any blurred vision or headaches. Reports taking eye drops for management.   Dysuria: Urinary symptoms of burning itching began 3 months ago.  Intermittent. Denies any vaginal discharge, lesions, cloudy appearing urine or hematuria. Denies taking anything for symptoms.   Arthralgia: Chronic right hip pain. Reports history of a fall at home about 3 months ago. Denies any decreased ROM, wounds, swelling, or paresthesias of the right leg. Reports taking OTC Tylenol for symptoms with minimal relief of synmptoms.     Outpatient Medications Prior to Visit  Medication Sig Dispense Refill  . amLODipine (NORVASC) 2.5 MG tablet Take 2.5 mg by mouth daily.    Marland Kitchen glimepiride (AMARYL) 2 MG tablet Take 2 mg by mouth 2 (two) times daily.    Marland Kitchen levothyroxine (SYNTHROID, LEVOTHROID) 100 MCG tablet Take 100 mcg by mouth daily before breakfast.    . azithromycin (ZITHROMAX Z-PAK) 250 MG tablet Take 1 tablet (250 mg total) by mouth daily. 57m PO day 1, then 2564mPO days 205 (Patient not taking: Reported on  10/30/2014) 6 tablet 0  . brimonidine (ALPHAGAN) 0.2 % ophthalmic solution Place 1 drop into both eyes 3 (three) times daily.    . Marland Kitchenextromethorphan (DELSYM) 30 MG/5ML liquid Take 5 mLs (30 mg total) by mouth as needed for cough. (Patient not taking: Reported on 10/30/2014) 89 mL 0  . NON FORMULARY Take 1 tablet by mouth 2 (two) times daily. Type of Calcium not from our [place]    . NON FORMULARY Take 1 tablet by mouth daily. Osteo-Alfa 0.25  Alfacalcidol     Not from our [place]    . dorzolamide-timolol (COSOPT) 22.3-6.8 MG/ML ophthalmic solution Place 1 drop into both eyes 2 (two) times daily.    . naproxen (NAPROSYN) 500 MG tablet Take 500 mg by mouth 2 (two) times daily as needed (with food).     No facility-administered medications prior to visit.     ROS Review of Systems  Constitutional: Negative.   Eyes: Negative.   Respiratory: Negative.   Cardiovascular: Negative.   Gastrointestinal: Negative.   Genitourinary: Positive for dysuria.  Musculoskeletal: Positive for arthralgias.  Neurological: Negative.   Psychiatric/Behavioral: Negative.     Objective:  BP (!) 151/92 (BP Location: Left Arm, Patient Position: Sitting, Cuff Size: Normal)   Pulse 77   Temp 98.2 F (36.8 C) (Oral)   Resp 18   Ht '5\' 7"'  (1.702 m)   Wt 185 lb (83.9 kg)   SpO2 97%   BMI 28.98 kg/m   BP/Weight 04/18/2016 10/30/2014 1099/24/2683Systolic BP [id number] BP 92 68 83  Wt. (Lbs) 185 - -  BMI 28.98 - -     Physical Exam  Constitutional: She appears well-developed and well-nourished.  HENT:  Head: Normocephalic.  Right Ear: External ear normal.  Left Ear: External ear normal.  Nose: Nose normal.  Mouth/Throat: Oropharynx is clear and moist.  Eyes: Conjunctivae and EOM are normal. Pupils are equal, round, and reactive to light.  Neck: Normal range of motion. No JVD present. No thyromegaly present.  Cardiovascular: Normal rate, regular rhythm, normal heart sounds and intact distal  pulses.   Pulmonary/Chest: Effort normal and breath sounds normal.  Abdominal: Soft. Bowel sounds are normal.  Skin: Skin is warm and dry.  Nursing note and vitals reviewed.    Diabetic Foot Exam - Simple   Simple Foot Form Diabetic Foot exam was performed with the following findings:  Yes 04/18/2016 11:30 AM  Visual Inspection No deformities, no ulcerations, no other skin breakdown bilaterally:  Yes Sensation Testing Intact to touch and monofilament testing bilaterally:  Yes Pulse Check Posterior Tibialis and Dorsalis pulse intact bilaterally:  Yes Comments     Assessment & Plan:   Problem List Items Addressed This Visit      Endocrine   Diabetes mellitus without complication ([place]) - Primary   -Recommend checking CBG BID at home.   -Bring log of CBG to next follow up visit.   Relevant Medications   losartan (COZAAR) 25 MG tablet   glucose blood test strip   metFORMIN (GLUCOPHAGE) 1000 MG tablet   glimepiride (AMARYL) 4 MG tablet   Other Relevant Orders   Glucose (CBG) (Completed)   HgB A1c (Completed)   Ambulatory referral to Ophthalmology   CMP14+EGFR (Completed)   Lipid Panel (Completed)   Microalbumin/Creatinine Ratio, Urine (Completed)    Other Visit Diagnoses    Acute cystitis with hematuria       Relevant Medications   nitrofurantoin, macrocrystal-monohydrate, (MACROBID) 100 MG capsule   Other Relevant Orders   Urine cytology ancillary only (Completed)   History of glaucoma       Relevant Medications   dorzolamide-timolol (COSOPT) 22.3-6.8 MG/ML ophthalmic solution   Other Relevant Orders   Ambulatory referral to Ophthalmology   Dysuria       Relevant Orders   POCT urinalysis dipstick (Completed)   Urine cytology ancillary only (Completed)   Essential hypertension       - BP not at goal with current medication regimen. Increase dosage of amlodipine and add losartan.   -Schedule BP recheck in 2 weeks with clinic RN.   -If BP is greater than 90/60 (MAP  65 or greater) but not less than 130/80 may increase    dose of Losartan to 50 mg and recheck in another 2 weeks with clinic RN.    Relevant Medications   amLODipine (NORVASC) 10 MG tablet   losartan (COZAAR) 25 MG tablet   Other Relevant Orders   CMP14+EGFR (Completed)   Lipid Panel (Completed)   Microalbumin/Creatinine Ratio, Urine (Completed)   Healthcare maintenance       Relevant Medications   pneumococcal 13-valent conjugate vaccine (PREVNAR 13) injection 0.5 mL (Completed)   Other Relevant Orders   Tdap vaccine greater than or equal to 7yo IM (Completed)   Hepatitis C Antibody (Completed)   MM SCREENING BREAST TOMO BILATERAL   DG Bone Density   Hypothyroidism, unspecified type       Relevant Orders   TSH (Completed)   Chronic right hip pain  Relevant Medications   naproxen (NAPROSYN) 500 MG tablet   Other Relevant Orders   DG HIP UNILAT W OR W/O PELVIS 2-3 VIEWS RIGHT   Joint swelling       Relevant Medications   naproxen (NAPROSYN) 500 MG tablet   Other Relevant Orders   Rheumatoid factor (Completed)   ANA (Completed)   History of fall within past 90 days       Relevant Orders   DG HIP UNILAT W OR W/O PELVIS 2-3 VIEWS RIGHT      Meds ordered this encounter  Medications  . dorzolamide-timolol (COSOPT) 22.3-6.8 MG/ML ophthalmic solution    Sig: Place 1 drop into both eyes 2 (two) times daily.    Dispense:  10 mL    Refill:  0    Order Specific Question:   Supervising Provider    Answer:   Tresa Garter [id number]  . amLODipine (NORVASC) 10 MG tablet    Sig: Take 1 tablet (10 mg total) by mouth daily.    Dispense:  90 tablet    Refill:  0    Order Specific Question:   Supervising Provider    Answer:   Tresa Garter [id number]  . losartan (COZAAR) 25 MG tablet    Sig: Take 1 tablet (25 mg total) by mouth daily.    Dispense:  90 tablet    Refill:  0    Order Specific Question:   Supervising Provider    Answer:   Tresa Garter  [id number]  . DISCONTD: pneumococcal 13-valent conjugate vaccine (PREVNAR 13) SUSP injection    Sig: Inject 0.5 mLs into the muscle once.    Dispense:  0.5 mL    Refill:  0    Order Specific Question:   Supervising Provider    Answer:   Tresa Garter [id number]  . Blood Glucose Monitoring Suppl (TRUE METRIX METER) w/Device KIT    Sig: 1 Device by Does not apply route once.    Dispense:  1 kit    Refill:  0    Order Specific Question:   Supervising Provider    Answer:   Tresa Garter [id number]  . TRUEPLUS LANCETS 28G MISC    Sig: 1 kit by Does not apply route once.    Dispense:  100 each    Refill:  12    Order Specific Question:   Supervising Provider    Answer:   Tresa Garter [id number]  . glucose blood test strip    Sig: Use as instructed    Dispense:  100 each    Refill:  12    Order Specific Question:   Supervising Provider    Answer:   Tresa Garter [[id number]]  . nitrofurantoin, macrocrystal-monohydrate, (MACROBID) 100 MG capsule    Sig: Take 1 capsule (100 mg total) by mouth 2 (two) times daily.    Dispense:  10 capsule    Refill:  0    Order Specific Question:   Supervising Provider    Answer:   Tresa Garter [id number]  . naproxen (NAPROSYN) 500 MG tablet    Sig: Take 1 tablet (500 mg total) by mouth 2 (two) times daily with a meal.    Dispense:  60 tablet    Refill:  0    Order Specific Question:   Supervising Provider    Answer:   Tresa Garter [id number]  . metFORMIN (GLUCOPHAGE) 1000 MG tablet    Sig: Take 1 tablet (1,000  mg total) by mouth 2 (two) times daily with a meal.    Dispense:  180 tablet    Refill:  0    Order Specific Question:   Supervising Provider    Answer:   Tresa Garter [id number]  . glimepiride (AMARYL) 4 MG tablet    Sig: Take 1 tablet (4 mg total) by mouth daily before breakfast.    Dispense:  90 tablet    Refill:  0    Order Specific Question:   Supervising Provider    Answer:   Tresa Garter [id number]  . pneumococcal 13-valent conjugate vaccine (PREVNAR 13) injection 0.5 mL    Follow-up: Return in about 2 weeks (around 05/02/2016) for BP check with clinic RN.   Alfonse Spruce FNP

## 2016-04-18 NOTE — Progress Notes (Signed)
Patient is here for establish care  Patient has taking her current medication for today  Patient complains about pelvic pain   Frequent bathroom usage  Burning & itchy when urine

## 2016-04-18 NOTE — Patient Instructions (Addendum)
You will be called with your labs results. Start taking blood pressures daily. Bring log of blood pressures with your to follow up visit.    Acute Urinary Retention, Female Urinary retention means you are unable to pee completely or at all (empty your bladder). Follow these instructions at home:  Drink enough fluids to keep your pee (urine) clear or pale yellow.  If you are sent home with a tube that drains the bladder (catheter), there will be a drainage bag attached to it. There are two types of bags. One is big that you can wear at night without having to empty it. One is smaller and needs to be emptied more often.  Keep the drainage bag emptied.  Keep the drainage bag lower than the tube.  Only take medicine as told by your doctor. Contact a doctor if:  You have a low-grade fever.  You have spasms or you are leaking pee when you have spasms. Get help right away if:  You have chills or a fever.  Your catheter stops draining pee.  Your catheter falls out.  You have increased bleeding that does not stop after you have rested and increased the amount of fluids you had been drinking. This information is not intended to replace advice given to you by your health care provider. Make sure you discuss any questions you have with your health care provider. Document Released: 06/08/2007 Document Revised: 05/28/2015 Document Reviewed: 05/31/2012 Elsevier Interactive Patient Education  2017 [employer].

## 2016-04-19 LAB — CMP14+EGFR
ALK PHOS: 77 IU/L (ref 39–117)
ALT: 10 IU/L (ref 0–32)
AST: 20 IU/L (ref 0–40)
Albumin/Globulin Ratio: 1.3 (ref 1.2–2.2)
Albumin: 4.2 g/dL (ref 3.6–4.8)
BILIRUBIN TOTAL: 0.5 mg/dL (ref 0.0–1.2)
BUN / CREAT RATIO: 24 (ref 12–28)
BUN: 24 mg/dL (ref 8–27)
CO2: 23 mmol/L (ref 18–29)
Calcium: 8.2 mg/dL — ABNORMAL LOW (ref 8.7–10.3)
Chloride: 101 mmol/L (ref 96–106)
Creatinine, Ser: 0.98 mg/dL (ref 0.57–1.00)
GFR calc Af Amer: 69 mL/min/{1.73_m2} (ref >59)
GFR calc non Af Amer: 60 mL/min/{1.73_m2} (ref >59)
GLUCOSE: 108 mg/dL — AB (ref 65–99)
Globulin, Total: 3.3 g/dL (ref 1.5–4.5)
Potassium: 4 mmol/L (ref 3.5–5.2)
Sodium: 141 mmol/L (ref 134–144)
Total Protein: 7.5 g/dL (ref 6.0–8.5)

## 2016-04-19 LAB — HEPATITIS C ANTIBODY

## 2016-04-19 LAB — LIPID PANEL
CHOLESTEROL TOTAL: 226 mg/dL — AB (ref 100–199)
Chol/HDL Ratio: 3.3 ratio (ref 0.0–4.4)
HDL: 69 mg/dL (ref >39)
LDL Calculated: 130 mg/dL — ABNORMAL HIGH (ref 0–99)
TRIGLYCERIDES: 133 mg/dL (ref 0–149)
VLDL CHOLESTEROL CAL: 27 mg/dL (ref 5–40)

## 2016-04-19 LAB — TSH: TSH: 2.19 u[IU]/mL (ref 0.450–4.500)

## 2016-04-19 LAB — RHEUMATOID FACTOR

## 2016-04-19 LAB — MICROALBUMIN / CREATININE URINE RATIO
Creatinine, Urine: 21.1 mg/dL
MICROALBUM., U, RANDOM: 29.7 ug/mL
Microalb/Creat Ratio: 140.8 mg/g creat — ABNORMAL HIGH (ref 0.0–30.0)

## 2016-04-19 LAB — ANA: ANA: NEGATIVE

## 2016-04-21 ENCOUNTER — Other Ambulatory Visit: Payer: Self-pay | Admitting: Family Medicine

## 2016-04-21 DIAGNOSIS — B9689 Other specified bacterial agents as the cause of diseases classified elsewhere: Secondary | ICD-10-CM

## 2016-04-21 DIAGNOSIS — R809 Proteinuria, unspecified: Secondary | ICD-10-CM

## 2016-04-21 DIAGNOSIS — N760 Acute vaginitis: Secondary | ICD-10-CM

## 2016-04-21 DIAGNOSIS — E039 Hypothyroidism, unspecified: Secondary | ICD-10-CM

## 2016-04-21 DIAGNOSIS — E782 Mixed hyperlipidemia: Secondary | ICD-10-CM

## 2016-04-21 LAB — URINE CYTOLOGY ANCILLARY ONLY
BACTERIAL VAGINITIS: POSITIVE — AB
CANDIDA VAGINITIS: NEGATIVE

## 2016-04-21 MED ORDER — METRONIDAZOLE 500 MG PO TABS: 500.0000 mg | ORAL_TABLET | Freq: Two times a day (BID) | ORAL | 0 refills | Status: DC

## 2016-04-21 MED ORDER — LEVOTHYROXINE SODIUM 100 MCG PO TABS: 100.0000 ug | ORAL_TABLET | Freq: Every day | ORAL | 0 refills | Status: DC

## 2016-04-21 MED ORDER — ATORVASTATIN CALCIUM 40 MG PO TABS: 40.0000 mg | ORAL_TABLET | Freq: Every day | ORAL | 0 refills | Status: DC

## 2016-04-21 MED FILL — ?METRONIDAZOLE 500 MG TABLE: 500 | 7 days supply | Qty: 14 | Fill #0

## 2016-04-21 MED FILL — LEVOTHYROXINE 100 MCG TAB: 100 | 30 days supply | Qty: 30 | Fill #0

## 2016-04-21 MED FILL — ATORVASTATIN 40 MG TABLET: 40 | 30 days supply | Qty: 30 | Fill #0

## 2016-04-26 ENCOUNTER — Telehealth: Payer: Self-pay

## 2016-04-26 NOTE — Telephone Encounter (Signed)
CMA call regarding lab results CMA call interpreter  thought would need it but son speak english   Interpreter name  Isaias Cowman   Interpreter ID # [id number]  CMA spoke with patient son   Son Verify Patient DOB  Son was aware and understood results

## 2016-04-26 NOTE — Telephone Encounter (Signed)
-----   Message from Lizbeth Bark, FNP sent at 04/21/2016  1:15 PM EDT ----- Bacterial vaginosis was positive. BV is caused by an overgrowth of germs in the vagina. To reduce your risk of developing BV don't douche, don't use scented soap or sprays, and use protection during sexual intercourse. You will be prescribed metronidazole to treat.

## 2016-05-02 ENCOUNTER — Encounter (INDEPENDENT_AMBULATORY_CARE_PROVIDER_SITE_OTHER): Payer: Self-pay

## 2016-05-02 ENCOUNTER — Ambulatory Visit: Payer: Self-pay | Attending: Family Medicine | Admitting: *Deleted

## 2016-05-02 VITALS — BP 126/7 | HR 82 | Resp 16

## 2016-05-02 DIAGNOSIS — E119 Type 2 diabetes mellitus without complications: Secondary | ICD-10-CM | POA: Insufficient documentation

## 2016-05-02 DIAGNOSIS — Z7689 Persons encountering health services in other specified circumstances: Secondary | ICD-10-CM | POA: Insufficient documentation

## 2016-05-02 DIAGNOSIS — I10 Essential (primary) hypertension: Secondary | ICD-10-CM | POA: Insufficient documentation

## 2016-05-02 NOTE — Progress Notes (Signed)
Pt accompanied by her son today. He brings in  BP and blood sugar logs dated  4/17 through 4/30. BP range:  4/17- 114/78      4/18- 134/90    CBG: 137     4/19 - 130/86       4/20- 133/84     CBG:119 4/21- 136/82     CBG:107 4/22- 128/73     CBG:109 4/24- 125/84      4/25- 123/81     CBG:90 4/28- 130/75     CBG: 149 4/30- 127/86     CBG: 115        Pt is not taking medication as prescribed:  Norvasc and Glimeperide. Per pt., she takes Norvasc 5 mg daily and Glucophage 500 mg  daily.   Denies chest pain, HA, SOB, blurred vision, swelling  Pt brought in paper work from last OV. She requests that these medications be sent to the pharmacy  [facility], [place] [address]:  Vitamin D3 1000 mg daily Calcium 500 mg by mouth tid

## 2016-05-09 ENCOUNTER — Ambulatory Visit (HOSPITAL_COMMUNITY)
Admission: RE | Admit: 2016-05-09 | Discharge: 2016-05-09 | Disposition: A | Discharge status: Home / Self Care | Payer: Self-pay | Source: Ambulatory Visit | Attending: Family Medicine | Admitting: Family Medicine

## 2016-05-09 DIAGNOSIS — M25551 Pain in right hip: Secondary | ICD-10-CM | POA: Insufficient documentation

## 2016-05-09 DIAGNOSIS — G8929 Other chronic pain: Secondary | ICD-10-CM | POA: Insufficient documentation

## 2016-05-09 DIAGNOSIS — Z9181 History of falling: Secondary | ICD-10-CM | POA: Insufficient documentation

## 2016-05-09 DIAGNOSIS — M1611 Unilateral primary osteoarthritis, right hip: Secondary | ICD-10-CM | POA: Insufficient documentation

## 2016-05-11 ENCOUNTER — Encounter: Payer: Self-pay | Admitting: Family Medicine

## 2016-05-12 ENCOUNTER — Other Ambulatory Visit: Payer: Self-pay | Admitting: Family Medicine

## 2016-05-12 NOTE — Telephone Encounter (Signed)
Patient concern regarding xray

## 2016-05-16 ENCOUNTER — Other Ambulatory Visit: Payer: Self-pay | Admitting: Family Medicine

## 2016-05-16 DIAGNOSIS — M1611 Unilateral primary osteoarthritis, right hip: Secondary | ICD-10-CM | POA: Insufficient documentation

## 2016-05-16 HISTORY — DX: Unilateral primary osteoarthritis, right hip: M16.11

## 2016-05-19 ENCOUNTER — Telehealth: Payer: Self-pay

## 2016-05-19 NOTE — Telephone Encounter (Signed)
CMA call regarding results  Patient did not answer & no Vm set up

## 2016-05-19 NOTE — Telephone Encounter (Signed)
-----   Message from Lizbeth BarkMandesia R Hairston, FNP sent at 05/16/2016  9:12 AM EDT ----- Right hip x-ray is shows moderate arthritis with narrowing of the hip joint.  You will be referred to orthopedics. Apply for orange card if you have not done so to complete referral process.

## 2016-05-24 ENCOUNTER — Encounter (INDEPENDENT_AMBULATORY_CARE_PROVIDER_SITE_OTHER): Payer: Self-pay | Admitting: Orthopaedic Surgery

## 2016-05-24 ENCOUNTER — Ambulatory Visit (INDEPENDENT_AMBULATORY_CARE_PROVIDER_SITE_OTHER): Payer: Self-pay | Admitting: Orthopaedic Surgery

## 2016-05-24 ENCOUNTER — Encounter (INDEPENDENT_AMBULATORY_CARE_PROVIDER_SITE_OTHER): Payer: Self-pay

## 2016-05-24 DIAGNOSIS — M1611 Unilateral primary osteoarthritis, right hip: Secondary | ICD-10-CM

## 2016-05-24 NOTE — Progress Notes (Signed)
Office Visit Note   Patient: Norma Clay           Date of Birth: 12/19/1950           MRN: [id number] Visit Date: 05/24/2016              Requested by: Lizbeth BarkHairston, Mandesia R, FNP [address] [place], [place] [id number] PCP: Lizbeth BarkHairston, Mandesia R, FNP   Assessment & Plan: Visit Diagnoses:  1. Primary osteoarthritis of right hip     Plan: I would like to set her up with an intra-articular steroid injection with Dr. Alvester MorinNewton to see if this will give her good pain relief. I think part of her pain may be trochanteric bursitis or radicular pain. Most of her symptoms seem to be stemming from her hip joint. She has moderate to severe arthritis on x-rays. I would like to see how she responds to injection in about 4 weeks. Total face to face encounter time was greater than 45 minutes and over half of this time was spent in counseling and/or coordination of care.  Follow-Up Instructions: Return in about 4 weeks (around 06/21/2016).   Orders:  Orders Placed This Encounter  Procedures  . Ambulatory referral to Physical Medicine Rehab   No orders of the defined types were placed in this encounter.     Procedures: No procedures performed   Clinical Data: No additional findings.   Subjective: Chief Complaint  Patient presents with  . Right Hip - Pain    Patient is a 66 year old [place] female comes in with right hip pain and groin pain for several months. This is started after she had 2 falls. She also complains of some low back pain with radiation down her leg occasionally. She denies any bowel or bladder dysfunction. She does walk with a cane due to the pain.    Review of Systems  Constitutional: Negative.   HENT: Negative.   Eyes: Negative.   Respiratory: Negative.   Cardiovascular: Negative.   Endocrine: Negative.   Musculoskeletal: Negative.   Neurological: Negative.   Hematological: Negative.   Psychiatric/Behavioral: Negative.   All other systems reviewed and are  negative.    Objective: Vital Signs: There were no vitals taken for this visit.  Physical Exam  Constitutional: She is oriented to person, place, and time. She appears well-developed and well-nourished.  HENT:  Head: Normocephalic and atraumatic.  Eyes: EOM are normal.  Neck: Neck supple.  Pulmonary/Chest: Effort normal.  Abdominal: Soft.  Neurological: She is alert and oriented to person, place, and time.  Skin: Skin is warm. Capillary refill takes less than 2 seconds.  Psychiatric: She has a normal mood and affect. Her behavior is normal. Judgment and thought content normal.  Nursing note and vitals reviewed.   Ortho Exam Right hip and leg exam shows painful range of motion with catching pain. She has a positive Stinchfield sign. Lateral hip is tender. Negative sciatic tension signs. Specialty Comments:  No specialty comments available.  Imaging: No results found.   PMFS History: Patient Active Problem List   Diagnosis Date Noted  . Osteoarthritis of right hip 05/16/2016  . Diabetes mellitus without complication (HCC) 04/18/2016   Past Medical History:  Diagnosis Date  . Diabetes mellitus without complication (HCC)   . Glaucoma   . Hypertension     No family history on file.  Past Surgical History:  Procedure Laterality Date  . GLAUCOMA SURGERY    . THYROIDECTOMY     Social History  Occupational History  . Not on file.   Social History Main Topics  . Smoking status: Never Smoker  . Smokeless tobacco: Never Used  . Alcohol use No  . Drug use: No  . Sexual activity: Not on file

## 2016-06-01 MED FILL — LOSARTAN POTASSIUM 25 MG TA: 25 | 30 days supply | Qty: 30 | Fill #1

## 2016-06-02 MED FILL — DORZOLAMIDE-TIMOLOL EYE DRP: 22.3-6.8 | 10 days supply | Qty: 10 | Fill #0

## 2016-06-02 MED FILL — TRAVATAN Z 0.004% EYE DROP: 0.004 | 10 days supply | Qty: 3 | Fill #0

## 2016-06-03 ENCOUNTER — Ambulatory Visit (INDEPENDENT_AMBULATORY_CARE_PROVIDER_SITE_OTHER): Payer: Self-pay | Admitting: Physical Medicine and Rehabilitation

## 2016-06-03 ENCOUNTER — Ambulatory Visit (INDEPENDENT_AMBULATORY_CARE_PROVIDER_SITE_OTHER): Payer: Self-pay

## 2016-06-03 ENCOUNTER — Encounter (INDEPENDENT_AMBULATORY_CARE_PROVIDER_SITE_OTHER): Payer: Self-pay

## 2016-06-03 DIAGNOSIS — M25551 Pain in right hip: Secondary | ICD-10-CM

## 2016-06-03 NOTE — Progress Notes (Deleted)
Right hip and  Pain for several months.. Pain down leg to foot occasionally.

## 2016-06-03 NOTE — Progress Notes (Signed)
   Doree AlbeeZainab Bauernfeind - 66 y.o. female MRN [id number]  Date of birth: 05/15/1950  Office Visit Note: Visit Date: 06/03/2016 PCP: Lizbeth BarkHairston, Mandesia R, FNP Referred by: Thomas HoffHairston, Mandesia R, F*  Subjective: Chief Complaint  Patient presents with  . Right Hip - Pain   HPI: [name] is a 66 year old female who is present with her son who acts as interpreter provides much of the history. She is followed by Dr. Roda ShuttersXu requests a diagnostic and hopefully therapeutic right anesthetic hip arthrogram. She is having right hip and groin pain. She does report pain down the foot on occasion. No left-sided symptoms.    ROS Otherwise per HPI.  Assessment & Plan: Visit Diagnoses:  1. Pain in right hip     Plan: Findings:  Diagnostic and hopefully therapeutic right anesthetic hip arthrogram. Patient did have relief during the anesthetic phase of the injection. She is ambulating much better.    Meds & Orders: No orders of the defined types were placed in this encounter.  Orders Placed This Encounter  Procedures  . XR C-ARM NO REPORT    Follow-up: No Follow-up on file.   Procedures: Anesthetic hip arthrogram with fluoroscopic guidance. Date/Time: 06/03/2016 11:20 AM Performed by: Tyrell AntonioNEWTON, Lincy Belles Authorized by: Tyrell AntonioNEWTON, Logan Baltimore   Consent Given by:  Patient Site marked: the procedure site was marked   Timeout: prior to procedure the correct patient, procedure, and site was verified   Indications:  Pain and diagnostic evaluation Location:  Hip Site:  R hip joint Prep: patient was prepped and draped in usual sterile fashion   Needle Size:  22 G Approach:  Anterior Ultrasound Guidance: No   Fluoroscopic Guidance: No   Arthrogram: Yes   Medications:  3 mL bupivacaine 0.5 %; 80 mg triamcinolone acetonide 40 MG/ML Aspiration Attempted: Yes   Patient tolerance:  Patient tolerated the procedure well with no immediate complications  Arthrogram demonstrated excellent flow of contrast throughout the  joint surface without extravasation or obvious defect.  The patient had relief of symptoms during the anesthetic phase of the injection.      No notes on file   Clinical History: No specialty comments available.  She reports that she has never smoked. She has never used smokeless tobacco.  Recent Labs  04/18/16 1118  HGBA1C 8.6    Objective:  VS:  HT:    WT:   BMI:     BP:   HR: bpm  TEMP: ( )  RESP:  Physical Exam  Musculoskeletal:  Patient ambulates without aid with a antalgic gait to the right. She has pain with hip rotation. She has decreased ability to go from sit to stand.    Ortho Exam Imaging: No results found.  Past Medical/Family/Surgical/Social History: Medications & Allergies reviewed per EMR Patient Active Problem List   Diagnosis Date Noted  . Osteoarthritis of right hip 05/16/2016  . Diabetes mellitus without complication (HCC) 04/18/2016   Past Medical History:  Diagnosis Date  . Diabetes mellitus without complication (HCC)   . Glaucoma   . Hypertension    No family history on file. Past Surgical History:  Procedure Laterality Date  . GLAUCOMA SURGERY    . THYROIDECTOMY     Social History   Occupational History  . Not on file.   Social History Main Topics  . Smoking status: Never Smoker  . Smokeless tobacco: Never Used  . Alcohol use No  . Drug use: No  . Sexual activity: Not on file

## 2016-06-06 MED ORDER — TRIAMCINOLONE ACETONIDE 40 MG/ML IJ SUSP
80.0000 mg | INTRAMUSCULAR | Status: AC | PRN
  Administered 2016-06-03: 80 mg via INTRA_ARTICULAR

## 2016-06-06 MED ORDER — BUPIVACAINE HCL 0.5 % IJ SOLN
3.0000 mL | INTRAMUSCULAR | Status: AC | PRN
  Administered 2016-06-03: 3 mL via INTRA_ARTICULAR

## 2016-06-06 NOTE — Patient Instructions (Signed)

## 2016-06-14 MED FILL — DORZOLAMIDE-TIMOLOL EYE DRP: 22.3-6.8 | 10 days supply | Qty: 10 | Fill #1

## 2016-06-14 MED FILL — TRAVATAN Z 0.004% EYE DROP: 0.004 | 10 days supply | Qty: 3 | Fill #1

## 2016-06-21 ENCOUNTER — Ambulatory Visit (INDEPENDENT_AMBULATORY_CARE_PROVIDER_SITE_OTHER): Payer: Self-pay | Admitting: Orthopaedic Surgery

## 2016-06-21 MED FILL — LEVOTHYROXINE 100 MCG TAB: 100 | 60 days supply | Qty: 60 | Fill #1

## 2016-06-21 MED FILL — GLIMEPIRIDE 4 MG TABLET: 4 | 60 days supply | Qty: 60 | Fill #1

## 2016-06-22 ENCOUNTER — Ambulatory Visit (HOSPITAL_COMMUNITY)
Admission: EM | Admit: 2016-06-22 | Discharge: 2016-06-22 | Disposition: A | Discharge status: Home / Self Care | Payer: Self-pay | Attending: Family Medicine | Admitting: Family Medicine

## 2016-06-22 ENCOUNTER — Other Ambulatory Visit: Payer: Self-pay | Admitting: Family Medicine

## 2016-06-22 ENCOUNTER — Encounter (HOSPITAL_COMMUNITY): Payer: Self-pay | Admitting: Emergency Medicine

## 2016-06-22 ENCOUNTER — Telehealth (HOSPITAL_COMMUNITY): Payer: Self-pay | Admitting: Emergency Medicine

## 2016-06-22 ENCOUNTER — Ambulatory Visit: Payer: Self-pay | Attending: Family Medicine

## 2016-06-22 DIAGNOSIS — E119 Type 2 diabetes mellitus without complications: Secondary | ICD-10-CM | POA: Insufficient documentation

## 2016-06-22 DIAGNOSIS — M1611 Unilateral primary osteoarthritis, right hip: Secondary | ICD-10-CM | POA: Insufficient documentation

## 2016-06-22 DIAGNOSIS — I10 Essential (primary) hypertension: Secondary | ICD-10-CM

## 2016-06-22 DIAGNOSIS — R350 Frequency of micturition: Secondary | ICD-10-CM

## 2016-06-22 DIAGNOSIS — Z79899 Other long term (current) drug therapy: Secondary | ICD-10-CM | POA: Insufficient documentation

## 2016-06-22 DIAGNOSIS — R300 Dysuria: Secondary | ICD-10-CM

## 2016-06-22 DIAGNOSIS — Z7984 Long term (current) use of oral hypoglycemic drugs: Secondary | ICD-10-CM | POA: Insufficient documentation

## 2016-06-22 DIAGNOSIS — N3000 Acute cystitis without hematuria: Secondary | ICD-10-CM | POA: Insufficient documentation

## 2016-06-22 LAB — POCT URINALYSIS DIP (DEVICE)
BILIRUBIN URINE: NEGATIVE
Glucose, UA: >=1000 mg/dL — AB
Ketones, ur: NEGATIVE mg/dL
NITRITE: NEGATIVE
PH: 5.5 (ref 5.0–8.0)
Protein, ur: NEGATIVE mg/dL
Urobilinogen, UA: 0.2 mg/dL (ref 0.0–1.0)

## 2016-06-22 MED ORDER — CIPROFLOXACIN HCL 500 MG PO TABS: 500.0000 mg | ORAL_TABLET | Freq: Two times a day (BID) | ORAL | 0 refills | Status: DC

## 2016-06-22 MED ORDER — FLUCONAZOLE 150 MG PO TABS: 150.0000 mg | ORAL_TABLET | Freq: Once | ORAL | 0 refills | Status: DC

## 2016-06-22 MED ORDER — LOSARTAN POTASSIUM 25 MG PO TABS: 25.0000 mg | ORAL_TABLET | Freq: Every day | ORAL | 0 refills | Status: DC

## 2016-06-22 MED ORDER — FLUCONAZOLE 150 MG PO TABS: 150.0000 mg | ORAL_TABLET | Freq: Once | ORAL | 0 refills | Status: AC

## 2016-06-22 MED FILL — LOSARTAN POTASSIUM 25 MG TA: 25 | 60 days supply | Qty: 60 | Fill #0

## 2016-06-22 NOTE — ED Notes (Signed)
Urine specimen obtained while patient in lobby. Specimen in lab 

## 2016-06-22 NOTE — ED Provider Notes (Signed)
[facility]    CSN: [id number] Arrival date & time: 06/22/16  1011     History   Chief Complaint Chief Complaint  Patient presents with  . Urinary Tract Infection    HPI Norma Clay is a 66 y.o. female.   burning with urination, x 10 days.  Recently treated for uti one month ago  This is a 66 year old Arabic woman who presents with urinary frequency and dysuria of 10 days duration. She is treated for UTI 1 month ago and took all the medicine but the symptoms have returned. She's been drinking plenty of liquid. She does have some muscle aches in her back and suprapubic area, and also has some nausea but no vomiting. She seen no blood in the urine. She's had no fever.      Past Medical History:  Diagnosis Date  . Diabetes mellitus without complication (HCC)   . Glaucoma   . Hypertension     Patient Active Problem List   Diagnosis Date Noted  . Osteoarthritis of right hip 05/16/2016  . Diabetes mellitus without complication (HCC) 04/18/2016    Past Surgical History:  Procedure Laterality Date  . GLAUCOMA SURGERY    . THYROIDECTOMY      OB History    No data available       Home Medications    Prior to Admission medications   Medication Sig Start Date End Date Taking? Authorizing Provider  amLODipine (NORVASC) 10 MG tablet Take 1 tablet (10 mg total) by mouth daily. 04/18/16   Lizbeth Bark, FNP  atorvastatin (LIPITOR) 40 MG tablet Take 1 tablet (40 mg total) by mouth daily. 04/21/16   Lizbeth Bark, FNP  brimonidine (ALPHAGAN) 0.2 % ophthalmic solution Place 1 drop into both eyes 3 (three) times daily.    [provider]  ciprofloxacin (CIPRO) 500 MG tablet Take 1 tablet (500 mg total) by mouth 2 (two) times daily. 06/22/16   Norma Sidle, MD  dorzolamide-timolol (COSOPT) 22.3-6.8 MG/ML ophthalmic solution Place 1 drop into both eyes 2 (two) times daily. Patient not taking: Reported on 05/24/2016 04/18/16   Lizbeth Bark, FNP  fluconazole (DIFLUCAN) 150 MG tablet Take 1 tablet (150 mg total) by mouth once. Repeat if needed 06/22/16 06/22/16  Norma Sidle, MD  glimepiride (AMARYL) 4 MG tablet Take 1 tablet (4 mg total) by mouth daily before breakfast. Patient not taking: Reported on 05/24/2016 04/18/16   Arrie Senate R, FNP  glucose blood test strip Use as instructed Patient not taking: Reported on 05/24/2016 04/18/16   Lizbeth Bark, FNP  levothyroxine (SYNTHROID, LEVOTHROID) 100 MCG tablet Take 1 tablet (100 mcg total) by mouth daily before breakfast. 04/21/16   Lizbeth Bark, FNP  losartan (COZAAR) 25 MG tablet Take 1 tablet (25 mg total) by mouth daily. 06/22/16   Lizbeth Bark, FNP  metFORMIN (GLUCOPHAGE) 1000 MG tablet Take 1 tablet (1,000 mg total) by mouth 2 (two) times daily with a meal. Patient taking differently: Take 500 mg by mouth 2 (two) times daily with a meal.  04/18/16   Hairston, Oren Beckmann, FNP  metroNIDAZOLE (FLAGYL) 500 MG tablet Take 1 tablet (500 mg total) by mouth 2 (two) times daily. Patient not taking: Reported on 05/24/2016 04/21/16   Lizbeth Bark, FNP  naproxen (NAPROSYN) 500 MG tablet Take 1 tablet (500 mg total) by mouth 2 (two) times daily with a meal. Patient not taking: Reported on 05/24/2016 04/18/16   Lizbeth Bark, FNP  nitrofurantoin, macrocrystal-monohydrate, (MACROBID) 100 MG capsule Take 1 capsule (100 mg total) by mouth 2 (two) times daily. Patient not taking: Reported on 05/24/2016 04/18/16   Lizbeth Bark, FNP  NON FORMULARY Take 1 tablet by mouth 2 (two) times daily. Type of Calcium not from our country    [provider]  NON FORMULARY Take 1 tablet by mouth daily. Osteo-Alfa 0.25  Alfacalcidol     Not from our country    [provider]    Family History No family history on file.  Social History Social History  Substance Use Topics  . Smoking status: Never Smoker  . Smokeless tobacco: Never  Used  . Alcohol use No     Allergies   Patient has no known allergies.   Review of Systems Review of Systems  Genitourinary: Positive for dysuria and frequency.  All other systems reviewed and are negative.    Physical Exam Triage Vital Signs ED Triage Vitals [06/22/16 1034]  Enc Vitals Group     BP      Pulse      Resp      Temp      Temp src      SpO2      Weight      Height      Head Circumference      Peak Flow      Pain Score 9     Pain Loc      Pain Edu?      Excl. in GC?    No data found.   Updated Vital Signs BP 112/72 (BP Location: Left Arm)   Pulse 82   Temp 98.6 F (37 C) (Oral)   Resp 18   SpO2 97%    Physical Exam  Constitutional: She is oriented to person, place, and time. She appears well-developed and well-nourished.  Eyes: Conjunctivae are normal. Pupils are equal, round, and reactive to light.  Neck: Normal range of motion. Neck supple.  Pulmonary/Chest: Effort normal.  Musculoskeletal: Normal range of motion.  Neurological: She is alert and oriented to person, place, and time.  Skin: Skin is warm and dry.  Nursing note and vitals reviewed.    UC Treatments / Results  Labs (all labs ordered are listed, but only abnormal results are displayed) Labs Reviewed  POCT URINALYSIS DIP (DEVICE) - Abnormal; Notable for the following:       Result Value   Glucose, UA >=1000 (*)    Hgb urine dipstick SMALL (*)    Leukocytes, UA TRACE (*)    All other components within normal limits  URINE CULTURE    EKG  EKG Interpretation None       Radiology No results found.  Procedures Procedures (including critical care time)  Medications Ordered in UC Medications - No data to display   Initial Impression / Assessment and Plan / UC Course  I have reviewed the triage vital signs and the nursing notes.  Pertinent labs & imaging results that were available during my care of the patient were reviewed by me and considered in my medical  decision making (see chart for details).     Final Clinical Impressions(s) / UC Diagnoses   Final diagnoses:  Acute cystitis without hematuria    New Prescriptions New Prescriptions   CIPROFLOXACIN (CIPRO) 500 MG TABLET    Take 1 tablet (500 mg total) by mouth 2 (two) times daily.   FLUCONAZOLE (DIFLUCAN) 150 MG TABLET    Take 1  tablet (150 mg total) by mouth once. Repeat if needed     Norma Clay, Dalya Maselli, MD 06/22/16 1049

## 2016-06-22 NOTE — ED Triage Notes (Signed)
burning with urination, x 10 days.  Recently treated for uti one month ago

## 2016-06-22 NOTE — Telephone Encounter (Signed)
The patient requested that her medications be resent to [facility]. 

## 2016-06-22 NOTE — Discharge Instructions (Signed)
He will be a good idea to see your personal doctor in 7-10 days to make sure the urine infection has completely cleared.  Also I noticed that you have been treated for glaucoma. You should continue your glaucoma eyedrops until told to discontinue them.  You are showing a fair amount of glucose in the urine. This may indicate that your diabetes is not well-controlled. I noticed that you had been taking diabetes medicine and this should be continued until your doctor tells you to stop.  I'm prescribing a his medicine to take along with the antibiotic to make sure your symptoms are completely resolved in the next 48 hours.

## 2016-06-23 LAB — URINE CULTURE

## 2016-06-24 ENCOUNTER — Other Ambulatory Visit: Payer: Self-pay | Admitting: Family Medicine

## 2016-06-24 MED FILL — FLUCONAZOLE 150 MG TABLET: 150 | 3 days supply | Qty: 2 | Fill #0

## 2016-06-29 MED FILL — TRAVATAN Z 0.004% EYE DROP: 0.004 | 25 days supply | Qty: 3 | Fill #2

## 2016-06-29 MED FILL — DORZOLAMIDE-TIMOLOL EYE DRP: 22.3-6.8 | 50 days supply | Qty: 10 | Fill #2

## 2016-07-07 MED FILL — TRAVATAN Z 0.004% EYE DROP: 0.004 | 25 days supply | Qty: 3 | Fill #3

## 2016-07-26 ENCOUNTER — Other Ambulatory Visit: Payer: Self-pay | Admitting: Family Medicine

## 2016-07-26 DIAGNOSIS — Z1231 Encounter for screening mammogram for malignant neoplasm of breast: Secondary | ICD-10-CM

## 2016-08-01 MED FILL — DORZOLAMIDE-TIMOLOL EYE DRP: 22.3-6.8 | 50 days supply | Qty: 10 | Fill #3

## 2016-08-01 MED FILL — TRAVATAN Z 0.004% EYE DROP: 0.004 | 25 days supply | Qty: 3 | Fill #4

## 2016-08-31 ENCOUNTER — Other Ambulatory Visit: Payer: Self-pay | Admitting: Family Medicine

## 2016-08-31 DIAGNOSIS — I10 Essential (primary) hypertension: Secondary | ICD-10-CM

## 2016-08-31 DIAGNOSIS — E119 Type 2 diabetes mellitus without complications: Secondary | ICD-10-CM

## 2016-08-31 DIAGNOSIS — E039 Hypothyroidism, unspecified: Secondary | ICD-10-CM

## 2016-08-31 MED FILL — LOSARTAN POTASSIUM 25 MG TA: 25 | 30 days supply | Qty: 30 | Fill #2

## 2016-09-01 MED FILL — LEVOTHYROXINE 100 MCG TAB: 100 | 30 days supply | Qty: 30 | Fill #0

## 2016-09-01 MED FILL — GLIMEPIRIDE 4 MG TABLET: 4 | 30 days supply | Qty: 30 | Fill #0

## 2016-09-06 NOTE — Telephone Encounter (Signed)
CMA call regarding lab results  Patient did not answer & unable to leave message  

## 2016-09-19 MED FILL — ?METFORMIN HCL 1,000 MG TAB: 1000 | 30 days supply | Qty: 60 | Fill #1

## 2016-09-21 MED FILL — TRAVATAN Z 0.004% EYE DROP: 0.004 | 20 days supply | Qty: 3 | Fill #0

## 2016-09-21 MED FILL — DORZOLAMIDE-TIMOLOL EYE DRP: 22.3-6.8 | 40 days supply | Qty: 10 | Fill #0

## 2016-09-21 MED FILL — ALPHAGAN P 0.1% DROPS: 0.1 | 40 days supply | Qty: 5 | Fill #0

## 2016-09-27 MED FILL — LOSARTAN POTASSIUM 25 MG TA: 25 | 30 days supply | Qty: 30 | Fill #0

## 2016-09-27 MED FILL — GLIMEPIRIDE 4 MG TABLET: 4 | 30 days supply | Qty: 30 | Fill #1

## 2016-09-27 MED FILL — LEVOTHYROXINE 100 MCG TAB: 100 | 30 days supply | Qty: 30 | Fill #1

## 2016-09-27 MED FILL — ?ATORVASTATIN 40MG TABLET: 40 | 30 days supply | Qty: 30 | Fill #1

## 2016-09-27 MED FILL — AMLODIPINE BESYLATE 10 MG T: 10 | 30 days supply | Qty: 30 | Fill #1

## 2016-10-07 ENCOUNTER — Ambulatory Visit: Payer: Self-pay | Attending: Family Medicine

## 2016-10-12 ENCOUNTER — Ambulatory Visit: Payer: Self-pay | Attending: Internal Medicine | Admitting: Physician Assistant

## 2016-10-12 ENCOUNTER — Encounter: Payer: Self-pay | Admitting: Physician Assistant

## 2016-10-12 VITALS — BP 103/70 | HR 71 | Temp 98.4°F | Resp 18 | Ht 66.0 in | Wt 181.8 lb

## 2016-10-12 DIAGNOSIS — E119 Type 2 diabetes mellitus without complications: Secondary | ICD-10-CM

## 2016-10-12 DIAGNOSIS — E559 Vitamin D deficiency, unspecified: Secondary | ICD-10-CM

## 2016-10-12 DIAGNOSIS — E782 Mixed hyperlipidemia: Secondary | ICD-10-CM

## 2016-10-12 DIAGNOSIS — H409 Unspecified glaucoma: Secondary | ICD-10-CM | POA: Insufficient documentation

## 2016-10-12 DIAGNOSIS — Z7984 Long term (current) use of oral hypoglycemic drugs: Secondary | ICD-10-CM | POA: Insufficient documentation

## 2016-10-12 DIAGNOSIS — E039 Hypothyroidism, unspecified: Secondary | ICD-10-CM

## 2016-10-12 DIAGNOSIS — E890 Postprocedural hypothyroidism: Secondary | ICD-10-CM | POA: Insufficient documentation

## 2016-10-12 DIAGNOSIS — I10 Essential (primary) hypertension: Secondary | ICD-10-CM

## 2016-10-12 LAB — GLUCOSE, POCT (MANUAL RESULT ENTRY): POC Glucose: 222 mg/dl — AB (ref 70–99)

## 2016-10-12 LAB — POCT GLYCOSYLATED HEMOGLOBIN (HGB A1C): HEMOGLOBIN A1C: 10

## 2016-10-12 MED ORDER — LEVOTHYROXINE SODIUM 100 MCG PO TABS: ORAL_TABLET | ORAL | 3 refills | Status: DC

## 2016-10-12 MED ORDER — AMLODIPINE BESYLATE 10 MG PO TABS: 10.0000 mg | ORAL_TABLET | Freq: Every day | ORAL | 0 refills | Status: DC

## 2016-10-12 MED ORDER — ATORVASTATIN CALCIUM 40 MG PO TABS: 40.0000 mg | ORAL_TABLET | Freq: Every day | ORAL | 0 refills | Status: DC

## 2016-10-12 MED ORDER — METFORMIN HCL 1000 MG PO TABS: 1000.0000 mg | ORAL_TABLET | Freq: Two times a day (BID) | ORAL | 1 refills | Status: DC

## 2016-10-12 MED ORDER — LOSARTAN POTASSIUM 25 MG PO TABS: 25.0000 mg | ORAL_TABLET | Freq: Every day | ORAL | 3 refills | Status: DC

## 2016-10-12 NOTE — Progress Notes (Signed)
Norma Clay, is a 66 y.o. female  ZOX:[id number]  [id number]:[id number]  DOB - 1950/03/14  Subjective:  Chief Complaint and HPI: Norma Clay is a 66 y.o. female here today for uncontrolled blood sugars and needs RF.  Her son is here translating.  She has only been taking 1/2 metformin tablet twice daily because they are so large.  Other than that, compliant with meds.  Not checking blood sugars regularly.    She [place] calcium [place] vitamin [place] and wants to start getting them here now so she doesn't have to arrange getting them overseas.  H/O thyroidectomy.  ROS:   Constitutional:  No f/c, No night sweats, No unexplained weight loss. EENT:  No vision changes, No blurry vision, No hearing changes. No mouth, throat, or ear problems.  Respiratory: No cough, No SOB Cardiac: No CP, no palpitations GI:  No abd pain, No N/V/D. GU: No Urinary s/sx Musculoskeletal: No joint pain Neuro: No headache, no dizziness, no motor weakness.  Skin: No rash Endocrine:  No polydipsia. No polyuria.  Psych: Denies SI/HI  No problems updated.  ALLERGIES: No Known Allergies  PAST MEDICAL HISTORY: Past Medical History:  Diagnosis Date  . Diabetes mellitus without complication (HCC)   . Glaucoma   . Hypertension     MEDICATIONS AT HOME: Prior to Admission medications   Medication Sig Start Date End Date Taking? Authorizing Provider  amLODipine (NORVASC) 10 MG tablet Take 1 tablet (10 mg total) by mouth daily. 10/12/16   Anders Simmonds, PA-C  atorvastatin (LIPITOR) 40 MG tablet Take 1 tablet (40 mg total) by mouth daily. 10/12/16   Anders Simmonds, PA-C  brimonidine (ALPHAGAN) 0.2 % ophthalmic solution Place 1 drop into both eyes 3 (three) times daily.    [provider]  dorzolamide-timolol (COSOPT) 22.3-6.8 MG/ML ophthalmic solution Place 1 drop into both eyes 2 (two) times daily. Patient not taking: Reported on 05/24/2016 04/18/16    Lizbeth Bark, FNP  glimepiride (AMARYL) 4 MG tablet TAKE 1 TABLET BY MOUTH DAILY BEFORE BREAKFAST. 09/01/16   Arrie Senate R, FNP  glucose blood test strip Use as instructed Patient not taking: Reported on 05/24/2016 04/18/16   Lizbeth Bark, FNP  levothyroxine (SYNTHROID, LEVOTHROID) 100 MCG tablet TAKE 1 TABLET BY MOUTH DAILY BEFORE BREAKFAST. 10/12/16   Anders Simmonds, PA-C  losartan (COZAAR) 25 MG tablet Take 1 tablet (25 mg total) by mouth daily. 10/12/16   Anders Simmonds, PA-C  metFORMIN (GLUCOPHAGE) 1000 MG tablet Take 1 tablet (1,000 mg total) by mouth 2 (two) times daily with a meal. 10/12/16   Conchita Truxillo, Marzella Schlein, PA-C  naproxen (NAPROSYN) 500 MG tablet Take 1 tablet (500 mg total) by mouth 2 (two) times daily with a meal. Patient not taking: Reported on 05/24/2016 04/18/16   Lizbeth Bark, FNP  NON FORMULARY Take 1 tablet by mouth 2 (two) times daily. Type of Calcium not from our country    [provider]  NON FORMULARY Take 1 tablet by mouth daily. Osteo-Alfa 0.25  Alfacalcidol     Not from our country    [provider]     Objective:  EXAM:   Vitals:   10/12/16 1205  BP: 103/70  Pulse: 71  Resp: 18  Temp: 98.4 F (36.9 C)  TempSrc: Oral  SpO2: 94%  Weight: 181 lb 12.8 oz (82.5 kg)  Height:  (1.676 m)    General  appearance : A&OX3. NAD. Non-toxic-appearing HEENT: Atraumatic and Normocephalic.  PERRLA. EOM intact.   Neck: supple, no JVD. No cervical lymphadenopathy. No thyromegaly Chest/Lungs:  Breathing-non-labored, Good air entry bilaterally, breath sounds normal without rales, rhonchi, or wheezing  CVS: S1 S2 regular, no murmurs, gallops, rubs  Extremities: Bilateral Lower Ext shows no edema, both legs are warm to touch with = pulse throughout Neurology:  CN II-XII grossly intact, Non focal.   Psych:  TP linear. J/I WNL. Normal speech. Appropriate eye contact and affect.  Skin:  No Rash  Data Review Lab Results   Component Value Date   HGBA1C 10 10/12/2016   HGBA1C 8.6 04/18/2016     Assessment & Plan   1. Diabetes mellitus without complication (HCC) Uncontrolled.  No changes to meds today bc she was only taking the equivalent to metformin  bid-needs to be on metformin  bid - POCT glucose (manual entry) - POCT glycosylated hemoglobin (Hb A1C) - metFORMIN (GLUCOPHAGE) 1000 MG tablet; Take 1 tablet (1,000 mg total) by mouth 2 (two) times daily with a meal.  Dispense: 180 tablet; Refill: 1 - Comprehensive metabolic panel Continue  amaryl daily  2. Acquired hypothyroidism - levothyroxine (SYNTHROID, LEVOTHROID) 100 MCG tablet; TAKE 1 TABLET BY MOUTH DAILY BEFORE BREAKFAST.  Dispense: 60 tablet; Refill: 3 - Thyroid Panel With TSH - Calcium, ionized - Vitamin D, 25-hydroxy  3. Essential hypertension Controlled-no changes to regimen - losartan (COZAAR) 25 MG tablet; Take 1 tablet (25 mg total) by mouth daily.  Dispense: 60 tablet; Refill: 3 - amLODipine (NORVASC) 10 MG tablet; Take 1 tablet (10 mg total) by mouth daily.  Dispense: 90 tablet; Refill: 0 - Comprehensive metabolic panel - Calcium, ionized  4. Mixed hyperlipidemia - atorvastatin (LIPITOR) 40 MG tablet; Take 1 tablet (40 mg total) by mouth daily.  Dispense: 90 tablet; Refill: 0  5. Vitamin D deficiency - Vitamin D, 25-hydroxy  Patient have been counseled extensively about nutrition and exercise.  Spent >7mins face to face and going through medications and counseling on proper diabetic diet and medication regimen  Return in about 3 months (around 01/12/2017) for mandesia; DM, htn, thyroid.  The patient was given clear instructions to go to ER or return to medical center if symptoms don't improve, worsen or new problems develop. The patient verbalized understanding. The patient was told to call to get lab results if they haven't heard anything in the next week.     Georgian Co, PA-C [facility] [place], [place] [phone]   10/12/2016, 12:28 PMPatient ID: Norma Clay, female   DOB: 1950/05/31, 66 y.o.   MRN: [id number]

## 2016-10-13 ENCOUNTER — Other Ambulatory Visit: Payer: Self-pay | Admitting: Physician Assistant

## 2016-10-13 LAB — THYROID PANEL WITH TSH
FREE THYROXINE INDEX: 2.3 (ref 1.2–4.9)
T3 UPTAKE RATIO: 27 % (ref 24–39)
T4 TOTAL: 8.5 ug/dL (ref 4.5–12.0)
TSH: 2.52 u[IU]/mL (ref 0.450–4.500)

## 2016-10-13 LAB — COMPREHENSIVE METABOLIC PANEL
ALT: 10 IU/L (ref 0–32)
AST: 14 IU/L (ref 0–40)
Albumin/Globulin Ratio: 1.4 (ref 1.2–2.2)
Albumin: 4.2 g/dL (ref 3.6–4.8)
Alkaline Phosphatase: 95 IU/L (ref 39–117)
BILIRUBIN TOTAL: 0.3 mg/dL (ref 0.0–1.2)
BUN/Creatinine Ratio: 26 (ref 12–28)
BUN: 27 mg/dL (ref 8–27)
CHLORIDE: 99 mmol/L (ref 96–106)
CO2: 23 mmol/L (ref 20–29)
Calcium: 8.7 mg/dL (ref 8.7–10.3)
Creatinine, Ser: 1.03 mg/dL — ABNORMAL HIGH (ref 0.57–1.00)
GFR calc Af Amer: 65 mL/min/{1.73_m2} (ref >59)
GFR calc non Af Amer: 56 mL/min/{1.73_m2} — ABNORMAL LOW (ref >59)
GLUCOSE: 213 mg/dL — AB (ref 65–99)
Globulin, Total: 3 g/dL (ref 1.5–4.5)
Potassium: 4.2 mmol/L (ref 3.5–5.2)
Sodium: 135 mmol/L (ref 134–144)
Total Protein: 7.2 g/dL (ref 6.0–8.5)

## 2016-10-13 LAB — VITAMIN D 25 HYDROXY (VIT D DEFICIENCY, FRACTURES): Vit D, 25-Hydroxy: 31 ng/mL (ref 30.0–100.0)

## 2016-10-13 LAB — CALCIUM, IONIZED: Calcium, Ion: 4.5 mg/dL (ref 4.5–5.6)

## 2016-10-14 ENCOUNTER — Telehealth: Payer: Self-pay | Admitting: *Deleted

## 2016-10-14 NOTE — Telephone Encounter (Signed)
Medical Assistant used [employer] to contact patient.  Interpreter Name: Harley Alto Interpreter #: [phone] MA spoke with [facility] and he confirmed patients results from [facility]. No further questions at this time.

## 2016-10-14 NOTE — Telephone Encounter (Signed)
-----   Message from Anders Simmonds, [place] sent at 10/13/2016  9:08 AM EDT ----- Kidney function is a little abnormal but should improve with better blood sugar control.  Her Calcium and vitamin D are normal.  I checked with our pharmacy and they do not off the supplements.  However, you can go to any drugstore/walmart, etc to get Vitamin D and Calcium.  I recommend 1500 mg Calcium daily and 2000 units of vitamin D daily.  You do not need a prescription for this.  Thyroid is normal and you can continue the same dose of synthroid. Follow-up as planned. Thanks, Georgian Co, PA-C

## 2016-10-18 ENCOUNTER — Ambulatory Visit (HOSPITAL_COMMUNITY)
Admission: EM | Admit: 2016-10-18 | Discharge: 2016-10-18 | Disposition: A | Discharge status: Home / Self Care | Payer: Self-pay | Attending: Family Medicine | Admitting: Family Medicine

## 2016-10-18 ENCOUNTER — Encounter (HOSPITAL_COMMUNITY): Payer: Self-pay | Admitting: Emergency Medicine

## 2016-10-18 DIAGNOSIS — L509 Urticaria, unspecified: Secondary | ICD-10-CM

## 2016-10-18 DIAGNOSIS — T7840XA Allergy, unspecified, initial encounter: Secondary | ICD-10-CM

## 2016-10-18 MED ORDER — METHYLPREDNISOLONE ACETATE 80 MG/ML IJ SUSP
INTRAMUSCULAR | Status: AC
  Filled 2016-10-18: qty 1

## 2016-10-18 MED ORDER — METHYLPREDNISOLONE ACETATE 80 MG/ML IJ SUSP
80.0000 mg | Freq: Once | INTRAMUSCULAR | Status: AC
  Administered 2016-10-18: 80 mg via INTRAMUSCULAR

## 2016-10-18 NOTE — ED Triage Notes (Signed)
Pt here for rash and itching x 2 days since taking clindamycin which she started 10/11/16

## 2016-10-18 NOTE — ED Provider Notes (Signed)
[facility]    CSN: [id number] Arrival date & time: 10/18/16  1825     History   Chief Complaint Chief Complaint  Patient presents with  . Allergic Reaction    HPI Norma Clay is a 66 y.o. female.   Presents with pruritic rash that began 2 days ago. She was taking clindamycin for reasons that are unclear per patient. She called her PCP and they asked her to D/C meds. She has been using Benadryl but the rash and itching remain. No dyspnea or swelling is noted.       Past Medical History:  Diagnosis Date  . Diabetes mellitus without complication (HCC)   . Glaucoma   . Hypertension     Patient Active Problem List   Diagnosis Date Noted  . Osteoarthritis of right hip 05/16/2016  . Diabetes mellitus without complication (HCC) 04/18/2016    Past Surgical History:  Procedure Laterality Date  . GLAUCOMA SURGERY    . THYROIDECTOMY      OB History    No data available       Home Medications    Prior to Admission medications   Medication Sig Start Date End Date Taking? Authorizing Provider  amLODipine (NORVASC) 10 MG tablet Take 1 tablet (10 mg total) by mouth daily. 10/12/16   Anders Simmonds, PA-C  atorvastatin (LIPITOR) 40 MG tablet Take 1 tablet (40 mg total) by mouth daily. 10/12/16   Anders Simmonds, PA-C  brimonidine (ALPHAGAN) 0.2 % ophthalmic solution Place 1 drop into both eyes 3 (three) times daily.    [provider]  dorzolamide-timolol (COSOPT) 22.3-6.8 MG/ML ophthalmic solution Place 1 drop into both eyes 2 (two) times daily. Patient not taking: Reported on 05/24/2016 04/18/16   Lizbeth Bark, FNP  glimepiride (AMARYL) 4 MG tablet TAKE 1 TABLET BY MOUTH DAILY BEFORE BREAKFAST. 09/01/16   Arrie Senate R, FNP  glucose blood test strip Use as instructed Patient not taking: Reported on 05/24/2016 04/18/16   Lizbeth Bark, FNP  levothyroxine (SYNTHROID, LEVOTHROID) 100 MCG tablet TAKE 1 TABLET BY MOUTH DAILY BEFORE  BREAKFAST. 10/12/16   Anders Simmonds, PA-C  losartan (COZAAR) 25 MG tablet Take 1 tablet (25 mg total) by mouth daily. 10/12/16   Anders Simmonds, PA-C  metFORMIN (GLUCOPHAGE) 1000 MG tablet Take 1 tablet (1,000 mg total) by mouth 2 (two) times daily with a meal. 10/12/16   McClung, Marzella Schlein, PA-C  naproxen (NAPROSYN) 500 MG tablet Take 1 tablet (500 mg total) by mouth 2 (two) times daily with a meal. Patient not taking: Reported on 05/24/2016 04/18/16   Lizbeth Bark, FNP    Family History History reviewed. No pertinent family history.  Social History Social History  Substance Use Topics  . Smoking status: Never Smoker  . Smokeless tobacco: Never Used  . Alcohol use No     Allergies   Patient has no known allergies.   Review of Systems Review of Systems  All other systems reviewed and are negative.    Physical Exam Triage Vital Signs ED Triage Vitals  Enc Vitals Group     BP 10/18/16 1903 130/75     Pulse Rate 10/18/16 1903 91     Resp 10/18/16 1903 18     Temp 10/18/16 1903 98.4 F (36.9 C)     Temp Source 10/18/16 1903 Oral     SpO2 10/18/16 1903 100 %     Weight --      Height --  Head Circumference --      Peak Flow --      Pain Score 10/18/16 1904 0     Pain Loc --      Pain Edu? --      Excl. in GC? --    No data found.   Updated Vital Signs BP 130/75 (BP Location: Left Arm)   Pulse 91   Temp 98.4 F (36.9 C) (Oral)   Resp 18   SpO2 100%   Visual Acuity Right Eye Distance:   Left Eye Distance:   Bilateral Distance:    Right Eye Near:   Left Eye Near:    Bilateral Near:     Physical Exam  Constitutional: She is oriented to person, place, and time. She appears well-developed and well-nourished. No distress.  Neurological: She is alert and oriented to person, place, and time.  Skin: Skin is warm and dry. Rash noted. She is not diaphoretic.  Macular papular rash with mild urticaria rash to trunk and neck that is diffuse    Psychiatric: Her behavior is normal.  Nursing note and vitals reviewed.    UC Treatments / Results  Labs (all labs ordered are listed, but only abnormal results are displayed) Labs Reviewed - No data to display  EKG  EKG Interpretation None       Radiology No results found.  Procedures Procedures (including critical care time)  Medications Ordered in UC Medications  methylPREDNISolone acetate (DEPO-MEDROL) injection 80 mg (not administered)     Initial Impression / Assessment and Plan / UC Course  I have reviewed the triage vital signs and the nursing notes.  Pertinent labs & imaging results that were available during my care of the patient were reviewed by me and considered in my medical decision making (see chart for details).     Most likely drug induced rash. She has D/C'ed medication per the PCP. Treat with  IM Depomedrol and continue use of Benadryl  every 4-6 hours for itching. FU as needed. Unsure of why on antibiotic therefore suggest f/u with PCP for this.  Final Clinical Impressions(s) / UC Diagnoses   Final diagnoses:  Urticaria  Allergic reaction, initial encounter    New Prescriptions New Prescriptions   No medications on file     Controlled Substance Prescriptions [place] Controlled Substance Registry consulted? Not Applicable   Sharin Mons 10/18/16 1924

## 2016-10-18 NOTE — Discharge Instructions (Signed)
I agree with a drug rash. Stop antibiotic. Treat with Benadryl  every 6 hours for itching. The shot will help in about 12 hours. Feel better. FU with your PCP.

## 2016-10-21 ENCOUNTER — Other Ambulatory Visit: Payer: Self-pay | Admitting: Physician Assistant

## 2016-10-21 ENCOUNTER — Other Ambulatory Visit: Payer: Self-pay | Admitting: Family Medicine

## 2016-10-21 DIAGNOSIS — Z8669 Personal history of other diseases of the nervous system and sense organs: Secondary | ICD-10-CM

## 2016-10-21 DIAGNOSIS — I10 Essential (primary) hypertension: Secondary | ICD-10-CM

## 2016-10-21 DIAGNOSIS — E782 Mixed hyperlipidemia: Secondary | ICD-10-CM

## 2016-10-21 DIAGNOSIS — E119 Type 2 diabetes mellitus without complications: Secondary | ICD-10-CM

## 2016-10-21 MED FILL — TRAVATAN Z 0.004% EYE DROP: 0.004 | 20 days supply | Qty: 3 | Fill #1

## 2016-10-21 MED FILL — LEVOTHYROXINE 100 MCG TAB: 100 | 30 days supply | Qty: 30 | Fill #0

## 2016-10-21 MED FILL — ALPHAGAN P 0.1% DROPS: 0.1 | 40 days supply | Qty: 5 | Fill #1

## 2016-10-21 MED FILL — ?METFORMIN HCL 1,000 MG TAB: 1000 | 30 days supply | Qty: 60 | Fill #2

## 2016-10-21 MED FILL — LOSARTAN POTASSIUM 25 MG TA: 25 | 30 days supply | Qty: 30 | Fill #1

## 2016-10-21 NOTE — Telephone Encounter (Signed)
Refill request for covering provider

## 2016-11-23 MED FILL — AMLODIPINE BESYLATE 10 MG T: 10 | 90 days supply | Qty: 90 | Fill #0

## 2016-11-23 MED FILL — TRAVATAN Z 0.004% EYE DROP: 0.004 | 90 days supply | Qty: 8 | Fill #0

## 2016-11-23 MED FILL — metFORMIN HCL 1000 MG TABS: 1000 | 90 days supply | Qty: 180 | Fill #0

## 2016-11-23 MED FILL — ATORVASTATIN 40 MG TABLET: 40 | 90 days supply | Qty: 90 | Fill #0

## 2016-11-23 MED FILL — DORZOLAMIDE-TIMOLOL EYE DRP: 22.3-6.8 | 90 days supply | Qty: 30 | Fill #0

## 2016-11-23 MED FILL — LOSARTAN POTASSIUM 25 MG TA: 25 | 90 days supply | Qty: 90 | Fill #0

## 2016-11-23 MED FILL — ALPHAGAN P 0.1% DROPS: 0.1 | 90 days supply | Qty: 30 | Fill #0

## 2016-11-28 ENCOUNTER — Encounter (HOSPITAL_COMMUNITY): Payer: Self-pay | Admitting: Emergency Medicine

## 2016-11-28 ENCOUNTER — Ambulatory Visit (HOSPITAL_COMMUNITY)
Admission: EM | Admit: 2016-11-28 | Discharge: 2016-11-28 | Disposition: A | Discharge status: Home / Self Care | Payer: Self-pay | Attending: Urgent Care | Admitting: Urgent Care

## 2016-11-28 DIAGNOSIS — R1030 Lower abdominal pain, unspecified: Secondary | ICD-10-CM

## 2016-11-28 DIAGNOSIS — H409 Unspecified glaucoma: Secondary | ICD-10-CM | POA: Insufficient documentation

## 2016-11-28 DIAGNOSIS — M549 Dorsalgia, unspecified: Secondary | ICD-10-CM

## 2016-11-28 DIAGNOSIS — E1165 Type 2 diabetes mellitus with hyperglycemia: Secondary | ICD-10-CM | POA: Insufficient documentation

## 2016-11-28 DIAGNOSIS — IMO0001 Reserved for inherently not codable concepts without codable children: Secondary | ICD-10-CM

## 2016-11-28 DIAGNOSIS — I10 Essential (primary) hypertension: Secondary | ICD-10-CM | POA: Insufficient documentation

## 2016-11-28 DIAGNOSIS — N3001 Acute cystitis with hematuria: Secondary | ICD-10-CM

## 2016-11-28 DIAGNOSIS — E119 Type 2 diabetes mellitus without complications: Secondary | ICD-10-CM

## 2016-11-28 DIAGNOSIS — R39859 Costovertebral (angle) tenderness, unspecified side: Secondary | ICD-10-CM

## 2016-11-28 LAB — POCT URINALYSIS DIP (DEVICE)
BILIRUBIN URINE: NEGATIVE
Glucose, UA: 500 mg/dL — AB
Ketones, ur: NEGATIVE mg/dL
NITRITE: NEGATIVE
PH: 6 (ref 5.0–8.0)
Protein, ur: NEGATIVE mg/dL
Specific Gravity, Urine: <=1.005 (ref 1.005–1.030)
Urobilinogen, UA: 0.2 mg/dL (ref 0.0–1.0)

## 2016-11-28 MED ORDER — SULFAMETHOXAZOLE-TRIMETHOPRIM 800-160 MG PO TABS
1.0000 | ORAL_TABLET | Freq: Two times a day (BID) | ORAL | 0 refills | Status: DC
Start: 2016-11-28 — End: 2017-09-14

## 2016-11-28 MED ORDER — FLUCONAZOLE 150 MG PO TABS: 150.0000 mg | ORAL_TABLET | ORAL | 0 refills | Status: DC

## 2016-11-28 NOTE — ED Notes (Signed)
Delay to treatment room, patient in lobby restroom

## 2016-11-28 NOTE — ED Provider Notes (Signed)
  MRN: [id number] DOB: 02/15/1950  Subjective:   Norma Clay is a 66 y.o. female presenting for 5 day history of dysuria, urinary frequency, bilateral flank pain, nausea without vomiting. Also has some vaginal itching, mild redness in groin are over left side. Denies fever, vaginal discharge. Patient has uncontrolled diabetes, last a1c was 10% on 10/12/2016. Blood sugar readings are 100-150's for the past 2 weeks.   Twisha managed with metformin, glimepiride and has No Known Allergies.  Norma Clay  has a past medical history of Diabetes mellitus without complication (HCC), Glaucoma, and Hypertension. Also  has a past surgical history that includes Glaucoma surgery and Thyroidectomy.  Objective:   Vitals: BP 125/72 (BP Location: Left Arm)   Pulse 89   Temp 98.7 F (37.1 C) (Oral)   Resp 18   SpO2 95%   Physical Exam  Constitutional: She is oriented to person, place, and time. She appears well-developed and well-nourished.  Cardiovascular: Normal rate, regular rhythm and intact distal pulses. Exam reveals no gallop and no friction rub.  No murmur heard. Pulmonary/Chest: No respiratory distress. She has no wheezes. She has no rales.  Abdominal: Soft. Bowel sounds are normal. She exhibits no distension and no mass. There is tenderness (lower abdomen). There is no guarding.  Mild bilateral CVA tenderness.  Neurological: She is alert and oriented to person, place, and time.  Psychiatric: She has a normal mood and affect.   Results for orders placed or performed during the hospital encounter of 11/28/16 (from the past 24 hour(s))  POCT urinalysis dip (device)     Status: Abnormal   Collection Time: 11/28/16 11:28 AM  Result Value Ref Range   Glucose, UA 500 (A) NEGATIVE mg/dL   Bilirubin Urine NEGATIVE NEGATIVE   Ketones, ur NEGATIVE NEGATIVE mg/dL   Specific Gravity, Urine <=1.005 1.005 - 1.030   Hgb urine dipstick MODERATE (A) NEGATIVE   pH 6.0 5.0 - 8.0   Protein, ur NEGATIVE NEGATIVE  mg/dL   Urobilinogen, UA 0.2 0.0 - 1.0 mg/dL   Nitrite NEGATIVE NEGATIVE   Leukocytes, UA SMALL (A) NEGATIVE   Assessment and Plan :   Acute cystitis with hematuria  Lower abdominal pain  CVA tenderness  Uncontrolled type 2 diabetes mellitus without complication (HCC)  Start Bactrim, urine culture pending. Recommended aggressive hydration. Patient plans on traveling to [place] tomorrow. I recommended f/u with PCP regarding her belly pain. Return-to-clinic precautions discussed, patient verbalized understanding.   Wallis BambergMario Shoaib Siefker, PA-C [facility]  11/28/2016  11:42 AM    Wallis BambergMani, Jamarkus Lisbon, PA-C 11/28/16 1226

## 2016-11-28 NOTE — ED Triage Notes (Signed)
Pt here for back pain and UTI sx x 5 days

## 2016-11-29 LAB — URINE CULTURE

## 2017-01-11 ENCOUNTER — Other Ambulatory Visit: Payer: Self-pay | Admitting: Family Medicine

## 2017-01-11 DIAGNOSIS — E2839 Other primary ovarian failure: Secondary | ICD-10-CM

## 2017-01-17 ENCOUNTER — Other Ambulatory Visit: Payer: Self-pay | Admitting: Family Medicine

## 2017-01-17 DIAGNOSIS — Z13820 Encounter for screening for osteoporosis: Secondary | ICD-10-CM

## 2017-02-27 MED FILL — DORZOLAMIDE-TIMOLOL EYE DRP: 22.3-6.8 | 30 days supply | Qty: 10 | Fill #1

## 2017-02-27 MED FILL — TRAVATAN Z 0.004% EYE DROP: 0.004 | 30 days supply | Qty: 3 | Fill #1

## 2017-02-27 MED FILL — ALPHAGAN P 0.1% DROPS: 0.1 | 30 days supply | Qty: 10 | Fill #1

## 2017-03-27 MED FILL — TRUE METRIX TEST STRIP: 33 days supply | Qty: 100 | Fill #1

## 2017-04-03 MED FILL — TRUEplus LANCETS 28G MISC: 30 days supply | Qty: 100 | Fill #1

## 2017-09-06 ENCOUNTER — Ambulatory Visit: Payer: Self-pay

## 2017-09-08 ENCOUNTER — Ambulatory Visit: Payer: Self-pay | Attending: Family Medicine

## 2017-09-14 ENCOUNTER — Other Ambulatory Visit: Payer: Self-pay

## 2017-09-14 ENCOUNTER — Ambulatory Visit: Payer: Self-pay | Attending: Family Medicine | Admitting: Family Medicine

## 2017-09-14 ENCOUNTER — Encounter: Payer: Self-pay | Admitting: Family Medicine

## 2017-09-14 VITALS — BP 113/75 | HR 78 | Temp 98.7°F | Resp 18 | Ht 64.0 in | Wt 171.0 lb

## 2017-09-14 DIAGNOSIS — E039 Hypothyroidism, unspecified: Secondary | ICD-10-CM

## 2017-09-14 DIAGNOSIS — I739 Peripheral vascular disease, unspecified: Secondary | ICD-10-CM

## 2017-09-14 DIAGNOSIS — E890 Postprocedural hypothyroidism: Secondary | ICD-10-CM | POA: Insufficient documentation

## 2017-09-14 DIAGNOSIS — M1612 Unilateral primary osteoarthritis, left hip: Secondary | ICD-10-CM | POA: Insufficient documentation

## 2017-09-14 DIAGNOSIS — I10 Essential (primary) hypertension: Secondary | ICD-10-CM

## 2017-09-14 DIAGNOSIS — M545 Low back pain, unspecified: Secondary | ICD-10-CM

## 2017-09-14 DIAGNOSIS — E1165 Type 2 diabetes mellitus with hyperglycemia: Secondary | ICD-10-CM

## 2017-09-14 DIAGNOSIS — R1013 Epigastric pain: Secondary | ICD-10-CM

## 2017-09-14 DIAGNOSIS — M25551 Pain in right hip: Secondary | ICD-10-CM

## 2017-09-14 DIAGNOSIS — N3001 Acute cystitis with hematuria: Secondary | ICD-10-CM

## 2017-09-14 DIAGNOSIS — H409 Unspecified glaucoma: Secondary | ICD-10-CM | POA: Insufficient documentation

## 2017-09-14 LAB — POCT GLYCOSYLATED HEMOGLOBIN (HGB A1C)
HbA1c POC (<> result, manual entry): 7.4 %
HbA1c, POC (controlled diabetic range): 7.4 % — AB (ref 0.0–7.0)
HbA1c, POC (prediabetic range): 7.4 % — AB (ref 5.7–6.4)
Hemoglobin A1C: 7.4 % — AB (ref 4.0–5.6)

## 2017-09-14 LAB — GLUCOSE, POCT (MANUAL RESULT ENTRY): POC Glucose: 222 mg/dL — AB (ref 70–99)

## 2017-09-14 MED ORDER — PANTOPRAZOLE SODIUM 40 MG PO TBEC: 40.0000 mg | DELAYED_RELEASE_TABLET | Freq: Every day | ORAL | 3 refills | Status: DC

## 2017-09-14 MED ORDER — NYSTATIN 100000 UNIT/GM EX OINT: 1.0000 "application " | TOPICAL_OINTMENT | Freq: Two times a day (BID) | CUTANEOUS | 6 refills | Status: DC

## 2017-09-14 MED ORDER — SULFAMETHOXAZOLE-TRIMETHOPRIM 800-160 MG PO TABS: 1.0000 | ORAL_TABLET | Freq: Two times a day (BID) | ORAL | 0 refills | Status: AC

## 2017-09-14 MED FILL — NYSTATIN 100,000 UNITS/GM O: 100000 | 15 days supply | Qty: 30 | Fill #0

## 2017-09-14 MED FILL — SULFAMETHOXAZOLE-TMP DS TAB: 800-160 | 3 days supply | Qty: 6 | Fill #0

## 2017-09-14 MED FILL — PANTOPRAZOLE SOD DR 40 MG T: 40 | 30 days supply | Qty: 30 | Fill #0

## 2017-09-14 NOTE — Progress Notes (Signed)
Flu shot: yes Pain: right side abdomen to right leg  [facility]

## 2017-09-14 NOTE — Progress Notes (Signed)
Subjective:    Patient ID: Norma Clay, female    DOB: [birth year], 68 y.o.   MRN: [id number]  HPI 67 year old female last seen in the office on October 12, 2016 for diabetes and patient's hemoglobin A1c at that time was 10 with creatinine of 1.03 on CMP.  Patient is accompanied by her son at today's visit.  Patient and son were offered interpreter services due to language barrier but son insisted on acting as interpreter for his mother.  Patient son states that his mother has been in [place] and only recently returned to the past few days.  Mother has been staying with his sister.  Son states that mother has had blood work done at the end of August in [place].  Patient's hemoglobin A1c was 8.4.  Patient had normal TSH.  Son reports that his mother had a diagnosis of low vitamin B12 and has received vitamin B12 injections x5 and is currently on a daily vitamin B12 supplement.      Per son, patient is having difficulty walking secondary to pain in her right hip.  Patient had this pain in the past and saw orthopedics and received an injection which helped but patient is now having pain again and is using a cane to help with walking.  Patient's son requests referral back to orthopedics regarding his mother's hip pain.      Son states that his mother has medications that may last for a few months that she was given before returning to [place].  Patient's son also states that patient reports that her blood sugars have been controlled and she was monitoring her blood sugars while in [place].  Patient does need a refill of a cream that she was using between her toes.  Patient received this cream while in [place] and has the package with her (combination of nystatin/triamcinolone/neomycin and garamycin).  Son states that his mother tends to complain often of multiple medical issues.  On review of systems, patient with complaint of occasional headaches-frontal, no dizziness.  Patient with complaint of some low back pain.   Patient with complaint of some nausea/upper abdominal discomfort as well as lower abdominal discomfort.  Patient denies any chest pain or palpitations.  No leg swelling.  Patient denied any other joint pain.  Patient denies any shortness of breath or cough. (I had to recurrently ask the son with his mother replied when doing the review of systems as the son kept downplaying patient's positive responses.  I did tell the son that I would need to use the video interpreter service if he was not willing to tell me his mother's responses which did increase sounds compliance with interpretation).  Past Medical History:  Diagnosis Date  . Diabetes mellitus without complication (HCC)   . Glaucoma   . Hypertension    Past Surgical History:  Procedure Laterality Date  . GLAUCOMA SURGERY    . THYROIDECTOMY    Family history is significant for patient's mother with diabetes otherwise patient denies any family history of heart attack/heart disease, stroke, hypertension or cancer   Review of Systems  Constitutional: Positive for fatigue. Negative for chills and fever.  HENT: Positive for congestion (Occasional nasal congestion). Negative for trouble swallowing.   Respiratory: Negative for cough and shortness of breath.   Cardiovascular: Negative for chest pain, palpitations and leg swelling.  Gastrointestinal: Positive for abdominal pain and nausea.  Genitourinary: Negative for dysuria and frequency.  Musculoskeletal: Positive for back pain. Negative for gait problem.  Neurological: Positive for headaches. Negative for dizziness.       Objective:   Physical Exam BP 113/75   Pulse 78   Temp 98.7 F (37.1 C) (Oral)   Resp 18   Ht 5\' 4"  (1.626 m)   Wt 171 lb (77.6 kg)   SpO2 97%   BMI 29.35 kg/m Vital signs and nurse's note reviewed General-well-nourished, well-developed older female in no acute distress.  Patient is accompanied by her son ENT- TMs gray, mild edema of the nasal turbinates with  mild clear discharge, mild posterior pharynx erythema Neck-supple, patient was healed surgical scar anterior neck (reports history of thyroid surgery x2), no palpable thyroid gland, no lymphadenopathy, no carotid bruit Lungs-clear to auscultation bilaterally Cardiovascular-regular rate and rhythm Abdomen- patient with mild epigastric discomfort to palpation, no rebound or guarding.  Patient also with mild suprapubic discomfort to palpation without rebound or guarding Back- left CVA tenderness Extremities- no edema Diabetic foot exam- patient with nonpalpable distal lower extremity pulses and patient feet are cool to touch especially on the left.  Patient with mild dusky appearance to the feet bilaterally (this darker color does not appear to be related to recent application of henna but not sure if color has darkened over time due to use of henna).  Patient with slightly slowed but within normal capillary refill.  Patient with some reduction in sensation but due to language barrier patient may also not have been able to fully comply with instructions during monofilament exam. Musculoskeletal- patient with tenderness to palpation over the right greater trochanter of the hip as well as with hip flexion and internal/external hip rotation on the right     Assessment & Plan:  1. Uncontrolled type 2 diabetes mellitus with hyperglycemia (HCC) Patient's hemoglobin A1c when she was last here in the clinic on October 2018 was elevated at 10.  Patient's son does have more recent lab results from last month showing an A1c of 8.4 which shows improvement.  Patient will have hemoglobin A1c repeated at today's visit and based on results, will likely leave patient on her current metformin and Amaryl but will make sure that there are no issues with her creatinine which would preclude her continued use of the metformin.  Patient given prescription for nystatin which is similar to requested cream but does not contain all  of the same ingredients.  Patient will also have lipid panel as son reports that she thinks that she takes too many pills and therefore has not been taking her previously prescribed atorvastatin.  Patient will be referred for diabetic eye exam and patient also with history of glaucoma.  Son reports that she is taking eyedrops but he does not have them with him today and he does not know the name of the drops.  Patient also with nonpalpable distal pulses along with coolness of the feet especially on the left and patient is being referred to vascular surgery for further evaluation of her circulation - HgB A1c - Glucose (CBG) - nystatin ointment (MYCOSTATIN); Apply 1 application topically 2 (two) times daily.  Dispense: 30 g; Refill: 6 - Lipid panel - POCT URINALYSIS DIP (CLINITEK) - Comprehensive metabolic panel - Ambulatory referral to Vascular Surgery - Ambulatory referral to Ophthalmology  2. Right hip pain Patient/son states that patient with recurrence of left hip pain secondary to osteoarthritis which has now led to gait abnormality and use of a cane to help with pain and balance.  Patient will be referred to orthopedics for  further evaluation and treatment - Ambulatory referral to Orthopedic Surgery  3. Essential hypertension Patient has medication box for losartan 25 mg with her and patient will continue use of this medication.  Blood pressure today appears to be controlled on losartan 25 mg.  Patient was on amlodipine 10 mg in the past  4. Hypothyroidism, unspecified type Patient with postsurgical hypothyroidism for which she is currently on levothyroxine 100 mg and patient has had recent normal TSH on lab work presented by her son that was done in August of this year  5. Left-sided low back pain without sciatica, unspecified chronicity Patient with complaint of back pain and does have left CVA tenderness on exam as well as some suprapubic tenderness on exam and patient will have  urinalysis to look for possible urine tract infection - POCT URINALYSIS DIP (CLINITEK)  6. Epigastric pain Patient with complaints of nausea and does have epigastric tenderness on exam.  Prescription provided for pantoprazole to take daily. Should notify the office if her epigastric pain and nausea continue or worsen so that she can return for sooner reevaluation - pantoprazole (PROTONIX) 40 MG tablet; Take 1 tablet (40 mg total) by mouth daily.  Dispense: 30 tablet; Refill: 3   7. Acute cystitis with hematuria Patient with leukocytes and hematuria on urinalysis and likely has urinary tract infection.  Patient's urine will be sent for culture and patient will be notified if any changes needed in her treatment based on these results.  Patient in the meantime will be started on Bactrim DS twice daily x3 days and encouraged to increase her water intake.  Patient/son should call/return to clinic if back pain and abdominal pain continue or worsen - Urine Culture - sulfamethoxazole-trimethoprim (BACTRIM DS,SEPTRA DS) 800-160 MG tablet; Take 1 tablet by mouth 2 (two) times daily for 3 days.  Dispense: 6 tablet; Refill: 0  An After Visit Summary was printed and given to the patient.  Return in about 3 months (around 12/14/2017) for 4 weeks with Franky Macho; 3 months with PCP.

## 2017-09-14 NOTE — Progress Notes (Signed)
Patient's son was informed during office visit.

## 2017-09-15 LAB — COMPREHENSIVE METABOLIC PANEL WITH GFR
ALT: 6 IU/L (ref 0–32)
AST: 18 IU/L (ref 0–40)
Albumin/Globulin Ratio: 1.3 (ref 1.2–2.2)
Albumin: 4.4 g/dL (ref 3.6–4.8)
Alkaline Phosphatase: 91 IU/L (ref 39–117)
BUN/Creatinine Ratio: 24 (ref 12–28)
BUN: 27 mg/dL (ref 8–27)
Bilirubin Total: 0.5 mg/dL (ref 0.0–1.2)
CO2: 22 mmol/L (ref 20–29)
Calcium: 7.7 mg/dL — ABNORMAL LOW (ref 8.7–10.3)
Chloride: 101 mmol/L (ref 96–106)
Creatinine, Ser: 1.12 mg/dL — ABNORMAL HIGH (ref 0.57–1.00)
GFR calc Af Amer: 58 mL/min/1.73 — ABNORMAL LOW
GFR calc non Af Amer: 51 mL/min/1.73 — ABNORMAL LOW
Globulin, Total: 3.5 g/dL (ref 1.5–4.5)
Glucose: 132 mg/dL — ABNORMAL HIGH (ref 65–99)
Potassium: 4 mmol/L (ref 3.5–5.2)
Sodium: 140 mmol/L (ref 134–144)
Total Protein: 7.9 g/dL (ref 6.0–8.5)

## 2017-09-15 LAB — LIPID PANEL
Chol/HDL Ratio: 3.6 ratio (ref 0.0–4.4)
Cholesterol, Total: 251 mg/dL — ABNORMAL HIGH (ref 100–199)
HDL: 70 mg/dL
LDL Calculated: 164 mg/dL — ABNORMAL HIGH (ref 0–99)
Triglycerides: 85 mg/dL (ref 0–149)
VLDL Cholesterol Cal: 17 mg/dL (ref 5–40)

## 2017-09-16 ENCOUNTER — Encounter: Payer: Self-pay | Admitting: Family Medicine

## 2017-09-17 ENCOUNTER — Encounter: Payer: Self-pay | Admitting: Family Medicine

## 2017-09-18 NOTE — Telephone Encounter (Signed)
Patient mychart response

## 2017-09-20 ENCOUNTER — Telehealth: Payer: Self-pay | Admitting: Family Medicine

## 2017-09-20 ENCOUNTER — Telehealth: Payer: Self-pay

## 2017-09-20 NOTE — Telephone Encounter (Signed)
Patient was called, no answer, lvm to return call. 

## 2017-09-20 NOTE — Telephone Encounter (Signed)
-----   Message from Cain Saupeammie Fulp, MD sent at 09/15/2017  5:25 PM EDT ----- Please notify patient/son that patient's cholesterol is elevated at 251 total and LDL of 164. I would suggest that she take her atorvastatin. Please let me know if new RX is needed. Patient also has a mild increase in her Creatinine. Her calcium level is low- please ask if patient was diagnosed with a problem with her parathyroid glands when she was recently in [place]. She may want to return for a lab visit to recheck her PTH (parathyroid hormone level) and calcium. (Patient's son speaks [place] but otherwise an interpreter who speaks Arabic will be needed to give results)

## 2017-09-20 NOTE — Telephone Encounter (Signed)
Patient's son returned the call regarding the results. Please follow up with patients son.

## 2017-09-21 NOTE — Telephone Encounter (Signed)
My chart response

## 2017-09-22 ENCOUNTER — Other Ambulatory Visit: Payer: Self-pay | Admitting: Family Medicine

## 2017-09-22 DIAGNOSIS — E782 Mixed hyperlipidemia: Secondary | ICD-10-CM

## 2017-09-22 DIAGNOSIS — Z9889 Other specified postprocedural states: Secondary | ICD-10-CM

## 2017-09-22 DIAGNOSIS — E890 Postprocedural hypothyroidism: Secondary | ICD-10-CM

## 2017-09-22 DIAGNOSIS — Z9089 Acquired absence of other organs: Secondary | ICD-10-CM

## 2017-09-22 MED ORDER — ATORVASTATIN CALCIUM 40 MG PO TABS: 40.0000 mg | ORAL_TABLET | Freq: Every day | ORAL | 11 refills | Status: DC

## 2017-09-22 MED FILL — ATORVASTATIN 40 MG TABLET: 40 | 30 days supply | Qty: 30 | Fill #0

## 2017-09-22 NOTE — Progress Notes (Signed)
Patient with recent blood work which showed an everything level of 7.7.  Patient has had history surgery to for removal of the thyroid gland.  I suspect that she may have also had removal of parathyroid glands.  Message has been sent to patient's family to have her return for lab visit to check PTH level and calcium.  Family is also aware patient's lipids were elevated on recent blood work and patient is to restart her atorvastatin.  New prescription will be sent to patient's pharmacy.

## 2017-09-27 ENCOUNTER — Ambulatory Visit (INDEPENDENT_AMBULATORY_CARE_PROVIDER_SITE_OTHER): Payer: Self-pay | Admitting: Vascular Surgery

## 2017-09-27 ENCOUNTER — Ambulatory Visit (HOSPITAL_COMMUNITY)
Admission: RE | Admit: 2017-09-27 | Discharge: 2017-09-27 | Disposition: A | Discharge status: Home / Self Care | Payer: Self-pay | Source: Ambulatory Visit | Attending: Vascular Surgery | Admitting: Vascular Surgery

## 2017-09-27 ENCOUNTER — Encounter: Payer: Self-pay | Admitting: Vascular Surgery

## 2017-09-27 ENCOUNTER — Other Ambulatory Visit: Payer: Self-pay

## 2017-09-27 VITALS — BP 124/80 | HR 73 | Temp 97.1°F | Resp 16 | Ht 64.0 in | Wt 171.0 lb

## 2017-09-27 DIAGNOSIS — M25551 Pain in right hip: Secondary | ICD-10-CM | POA: Insufficient documentation

## 2017-09-27 DIAGNOSIS — M25552 Pain in left hip: Secondary | ICD-10-CM | POA: Insufficient documentation

## 2017-09-27 DIAGNOSIS — I739 Peripheral vascular disease, unspecified: Secondary | ICD-10-CM

## 2017-09-27 NOTE — Progress Notes (Signed)
REASON FOR CONSULT:    Nonpalpable distal pulses with cold feet.  The consult is requested by Cammie Fulp.   HPI:   Norma Clay is a pleasant 67 y.o. female, who is referred with absent pulses and cold feet.  I have reviewed the records from [facility]  The patient was seen on 09/14/2017.  The patient has uncontrolled type 2 diabetes with hyperglycemia.  The patient also has right hip pain from osteoarthritis.  In addition she has essential hypertension. She does describe some right hip pain which sounds like this could be related to arthritis.  She describes some pain in her legs and feet but this occurs also with simply sitting and therefore I do not think this represents claudication.  She denies any history of ulcers or rest pain.  Of note the history is obtained through her family member who translates.  Her risk factors for peripheral vascular disease include diabetes, hypertension, and hypercholesterolemia.  She denies any family history of premature cardiovascular disease.  She denies any history of tobacco use.  She is on a statin.  Past Medical History:  Diagnosis Date  . Diabetes mellitus without complication (HCC)   . Glaucoma   . Hypertension     Family History  Problem Relation Age of Onset  . Diabetes Mother     SOCIAL HISTORY: Social History   Socioeconomic History  . Marital status: Married    Spouse name: Not on file  . Number of children: Not on file  . Years of education: Not on file  . Highest education level: Not on file  Occupational History  . Not on file  Social Needs  . Financial resource strain: Not on file  . Food insecurity:    Worry: Not on file    Inability: Not on file  . Transportation needs:    Medical: Not on file    Non-medical: Not on file  Tobacco Use  . Smoking status: Never Smoker  . Smokeless tobacco: Never Used  Substance and Sexual Activity  . Alcohol use: No  . Drug use: No  .  Sexual activity: Not on file  Lifestyle  . Physical activity:    Days per week: Not on file    Minutes per session: Not on file  . Stress: Not on file  Relationships  . Social connections:    Talks on phone: Not on file    Gets together: Not on file    Attends religious service: Not on file    Active member of club or organization: Not on file    Attends meetings of clubs or organizations: Not on file    Relationship status: Not on file  . Intimate partner violence:    Fear of current or ex partner: Not on file    Emotionally abused: Not on file    Physically abused: Not on file    Forced sexual activity: Not on file  Other Topics Concern  . Not on file  Social History Narrative  . Not on file    Allergies  Allergen Reactions  . Clindamycin/Lincomycin Rash  . Penicillins Rash    Patient was placed on a form of penicillin by her dentist and developed a rash therefore medication was changed to clindamycin.  Per son, patient is not allergic to clindamycin    Current Outpatient Medications  Medication Sig Dispense Refill  . amLODipine (NORVASC) 10 MG tablet Take 1 tablet (10 mg total) by  mouth daily. 90 tablet 0  . atorvastatin (LIPITOR) 40 MG tablet Take 1 tablet (40 mg total) by mouth daily. 30 tablet 11  . brimonidine (ALPHAGAN) 0.2 % ophthalmic solution Place 1 drop into both eyes 3 (three) times daily.    . calcium-vitamin D (OSCAL WITH D) 250-125 MG-UNIT tablet Take 1 tablet by mouth daily.    . COBALAMINE COMBINATIONS PO Take by mouth.    . dorzolamide-timolol (COSOPT) 22.3-6.8 MG/ML ophthalmic solution Place 1 drop into both eyes 2 (two) times daily. (Patient not taking: Reported on 05/24/2016) 10 mL 0  . fluconazole (DIFLUCAN) 150 MG tablet Take 1 tablet (150 mg total) by mouth once a week. 2 tablet 0  . glimepiride (AMARYL) 4 MG tablet TAKE 1 TABLET BY MOUTH DAILY BEFORE BREAKFAST. 60 tablet 0  . glucose blood test strip Use as instructed (Patient not taking: Reported  on 05/24/2016) 100 each 12  . levothyroxine (SYNTHROID, LEVOTHROID) 100 MCG tablet TAKE 1 TABLET BY MOUTH DAILY BEFORE BREAKFAST. 60 tablet 3  . losartan (COZAAR) 25 MG tablet Take 1 tablet (25 mg total) by mouth daily. 60 tablet 3  . metFORMIN (GLUCOPHAGE) 1000 MG tablet Take 1 tablet (1,000 mg total) by mouth 2 (two) times daily with a meal. 180 tablet 1  . naproxen (NAPROSYN) 500 MG tablet Take 1 tablet (500 mg total) by mouth 2 (two) times daily with a meal. 60 tablet 0  . nystatin ointment (MYCOSTATIN) Apply 1 application topically 2 (two) times daily. 30 g 6  . pantoprazole (PROTONIX) 40 MG tablet Take 1 tablet (40 mg total) by mouth daily. 30 tablet 3   No current facility-administered medications for this visit.     REVIEW OF SYSTEMS:  [X]  denotes positive finding, [ ]  denotes negative finding Cardiac  Comments:  Chest pain or chest pressure:    Shortness of breath upon exertion:    Short of breath when lying flat:    Irregular heart rhythm:        Vascular    Pain in calf, thigh, or hip brought on by ambulation: x   Pain in feet at night that wakes you up from your sleep:     Blood clot in your veins:    Leg swelling:         Pulmonary    Oxygen at home:    Productive cough:     Wheezing:         Neurologic    Sudden weakness in arms or legs:     Sudden numbness in arms or legs:     Sudden onset of difficulty speaking or slurred speech:    Temporary loss of vision in one eye:     Problems with dizziness:         Gastrointestinal    Blood in stool:     Vomited blood:         Genitourinary    Burning when urinating:     Blood in urine:        Psychiatric    Major depression:         Hematologic    Bleeding problems:    Problems with blood clotting too easily:        Skin    Rashes or ulcers:        Constitutional    Fever or chills:     PHYSICAL EXAM:   There were no vitals filed for this visit.  GENERAL: The patient is a well-nourished  female, in  no acute distress. The vital signs are documented above. CARDIAC: There is a regular rate and rhythm.  VASCULAR: I do not detect carotid bruits. She has palpable femoral, popliteal, and posterior tibial pulses bilaterally.  I cannot palpate dorsalis pedis pulses. She has no significant lower extremity swelling. PULMONARY: There is good air exchange bilaterally without wheezing or rales. ABDOMEN: Soft and non-tender with normal pitched bowel sounds.  I do not palpate an abdominal aortic aneurysm. MUSCULOSKELETAL: There are no major deformities or cyanosis. NEUROLOGIC: No focal weakness or paresthesias are detected. SKIN: There are no ulcers or rashes noted. PSYCHIATRIC: The patient has a normal affect.  DATA:    ARTERIAL DOPPLER STUDY: I have independently interpreted the patient's arterial Doppler study.  On the right side there is a triphasic dorsalis pedis and posterior tibial signal.  ABI is 100%.  Toe pressure is 136 mmHg.  On the left side there is a triphasic dorsalis pedis and posterior tibial signal.  ABI is 100%.  Toe pressure is 103 mmHg.   ASSESSMENT & PLAN:   EVALUATION FOR PERIPHERAL VASCULAR DISEASE: The patient has triphasic Doppler signals in both feet with normal ABIs and normal toe pressures.  I reassured her that she does not have any evidence of significant peripheral vascular disease.  I think that some of her pain is likely explained by arthritis.  We have discussed the importance of keeping a close eye on her diabetes and hypertension in addition to her hypercholesterolemia.  Fortunately she is not a smoker.  I have encouraged her to stay as active as possible specifically walking.  In addition we have discussed the importance of nutrition.  I will be happy to see her back at any time if any new vascular issues arise.  A total of 45 minutes was spent on this visit. 25 minutes was face to face time. More than 50% of the time was spent on counseling and coordinating with  the patient.    Waverly Ferrari [facility] [phone]

## 2017-10-03 NOTE — Telephone Encounter (Signed)
Patients son was called, verified dob and was given most recent lab results. Patients son verbalized understanding although had a question regarding creatinine level difference between him and his mother. Question will be followed up on.

## 2017-10-09 MED FILL — DORZOLAMIDE-TIMOLOL EYE DRP: 22.3-6.8 | 40 days supply | Qty: 10 | Fill #0

## 2017-10-09 MED FILL — TRAVATAN Z 0.004% EYE DROP: 0.004 | 40 days supply | Qty: 5 | Fill #0

## 2017-10-20 MED FILL — BRIMONIDINE 0.2% EYE DROP: 0.2 | 40 days supply | Qty: 10 | Fill #0

## 2017-10-24 ENCOUNTER — Other Ambulatory Visit: Payer: Self-pay

## 2017-10-25 ENCOUNTER — Encounter (INDEPENDENT_AMBULATORY_CARE_PROVIDER_SITE_OTHER): Payer: Self-pay | Admitting: Physical Medicine and Rehabilitation

## 2017-10-25 ENCOUNTER — Ambulatory Visit (INDEPENDENT_AMBULATORY_CARE_PROVIDER_SITE_OTHER): Payer: Self-pay

## 2017-10-25 ENCOUNTER — Ambulatory Visit (INDEPENDENT_AMBULATORY_CARE_PROVIDER_SITE_OTHER): Payer: Self-pay | Admitting: Physical Medicine and Rehabilitation

## 2017-10-25 DIAGNOSIS — M25551 Pain in right hip: Secondary | ICD-10-CM

## 2017-10-25 NOTE — Progress Notes (Signed)
 .  Numeric Pain Rating Scale and Functional Assessment Average Pain 8   In the last MONTH (on 0-10 scale) has pain interfered with the following?  1. General activity like being  able to carry out your everyday physical activities such as walking, climbing stairs, carrying groceries, or moving a chair?  Rating(5)    -Dye Allergies.  

## 2017-10-25 NOTE — Patient Instructions (Signed)

## 2017-10-25 NOTE — Progress Notes (Signed)
Norma Clay - 67 y.o. female MRN [id number]  Date of birth: [birth year]  Office Visit Note: Visit Date: 10/25/2017 PCP: Cain Saupe, MD Referred by: Cain Saupe, MD  Subjective: Chief Complaint  Patient presents with  . Right Hip - Pain   HPI: Norma Clay is a 67 y.o. female who comes in today For planned repeat of a right intra-articular hip injection.  Prior injection completed in June of this year which was a diagnostic anesthetic hip arthrogram gave her quite a bit of relief and just now over the last month or 2 she has been getting some worsening symptoms once again.  Pain is consistent in the right hip and groin region.  Worse with standing walking and moving the leg.  No radicular pain or new injury or falls.  Rates her pain as an 8 out of 10.  Her son acts as Equities trader.  We will repeat intra-articular hip injection today.  Prior to next injection would need follow-up with Dr. Roda Shutters.  ROS Otherwise per HPI.  Assessment & Plan: Visit Diagnoses:  1. Pain in right hip     Plan: No additional findings.   Meds & Orders: No orders of the defined types were placed in this encounter.   Orders Placed This Encounter  Procedures  . Large Joint Inj: R hip joint  . XR C-ARM NO REPORT    Follow-up: No follow-ups on file.   Procedures: Large Joint Inj: R hip joint on 10/25/2017 10:52 AM Indications: pain and diagnostic evaluation Details: 22 G needle, anterior approach  Arthrogram: Yes  Medications: 80 mg triamcinolone acetonide 40 MG/ML; 3 mL bupivacaine 0.5 % Outcome: tolerated well, no immediate complications  Arthrogram demonstrated excellent flow of contrast throughout the joint surface without extravasation or obvious defect.  The patient had relief of symptoms during the anesthetic phase of the injection.  Procedure, treatment alternatives, risks and benefits explained, specific risks discussed. Consent was given by the patient. Immediately prior to procedure a time out  was called to verify the correct patient, procedure, equipment, support staff and site/side marked as required. Patient was prepped and draped in the usual sterile fashion.      No notes on file   Clinical History: No specialty comments available.   She reports that she has never smoked. She has never used smokeless tobacco.  Recent Labs    09/14/17 1026  HGBA1C 7.4*  7.4*  7.4  7.4*    Objective:  VS:  HT:    WT:   BMI:     BP:   HR: bpm  TEMP: ( )  RESP:  Physical Exam  Ortho Exam Imaging: No results found.  Past Medical/Family/Surgical/Social History: Medications & Allergies reviewed per EMR, new medications updated. Patient Active Problem List   Diagnosis Date Noted  . Osteoarthritis of right hip 05/16/2016  . Diabetes mellitus without complication (HCC) 04/18/2016   Past Medical History:  Diagnosis Date  . Diabetes mellitus without complication (HCC)   . Glaucoma   . Hypertension    Family History  Problem Relation Age of Onset  . Diabetes Mother    Past Surgical History:  Procedure Laterality Date  . GLAUCOMA SURGERY    . THYROIDECTOMY     Social History   Occupational History  . Not on file  Tobacco Use  . Smoking status: Never Smoker  . Smokeless tobacco: Never Used  Substance and Sexual Activity  . Alcohol use: No  . Drug use: No  .  Sexual activity: Not on file

## 2017-10-30 ENCOUNTER — Other Ambulatory Visit: Payer: Self-pay | Admitting: Family Medicine

## 2017-10-30 NOTE — Progress Notes (Signed)
Patient ID: Norma Clay, female   DOB: 1950/07/10, 67 y.o.   MRN: [id number]   Patient's son was recently in the office and I reminded him at that time to please have his mother come in for a lab appointment to have a PTH and calcium level checked.  Patient's mother had removal of her thyroid and patient's son states that he is pretty sure that the parathyroid hormones were removed as well.  Patient is on a supplement from her home country to help with her calcium however with her labs done in August of this year, patient's calcium was 7.7.  Lab order was placed for patient to have PTH and calcium level back in August but patient had never returned to have this done therefore I again encouraged her son to make sure that she comes in within the next 1 to 2 weeks to have this test done to see if any further treatment is needed.

## 2017-10-31 ENCOUNTER — Ambulatory Visit: Payer: Self-pay | Attending: Family Medicine

## 2017-11-01 MED ORDER — BUPIVACAINE HCL 0.5 % IJ SOLN
3.0000 mL | INTRAMUSCULAR | Status: AC | PRN
  Administered 2017-10-25: 3 mL via INTRA_ARTICULAR

## 2017-11-01 MED ORDER — TRIAMCINOLONE ACETONIDE 40 MG/ML IJ SUSP
80.0000 mg | INTRAMUSCULAR | Status: AC | PRN
  Administered 2017-10-25: 80 mg via INTRA_ARTICULAR

## 2017-11-20 MED FILL — BRIMONIDINE 0.2% EYE DROP: 0.2 | 56 days supply | Qty: 15 | Fill #0

## 2017-11-20 MED FILL — DORZOLAMIDE-TIMOLOL EYE DRP: 22.3-6.8 | 56 days supply | Qty: 10 | Fill #0

## 2017-11-20 MED FILL — TRAVATAN Z 0.004% EYE DROP: 0.004 | 18 days supply | Qty: 3 | Fill #0

## 2017-11-24 ENCOUNTER — Ambulatory Visit: Payer: Self-pay | Attending: Family Medicine

## 2017-11-24 DIAGNOSIS — E890 Postprocedural hypothyroidism: Secondary | ICD-10-CM

## 2017-11-25 LAB — PTH, INTACT AND CALCIUM
Calcium: 7.2 mg/dL — ABNORMAL LOW (ref 8.7–10.3)
PTH: 6 pg/mL — ABNORMAL LOW (ref 15–65)

## 2017-11-26 ENCOUNTER — Other Ambulatory Visit: Payer: Self-pay | Admitting: Family Medicine

## 2017-11-26 DIAGNOSIS — E209 Hypoparathyroidism, unspecified: Secondary | ICD-10-CM

## 2017-11-26 NOTE — Progress Notes (Signed)
Patient ID: Norma Clay, female   DOB: 06/30/1950, 67 y.o.   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  Patient will be referred to endocrinology and in the interim, treatment will be started for her hyperparathyroidism

## 2017-12-03 ENCOUNTER — Other Ambulatory Visit: Payer: Self-pay | Admitting: Family Medicine

## 2017-12-03 DIAGNOSIS — E209 Hypoparathyroidism, unspecified: Secondary | ICD-10-CM

## 2017-12-03 NOTE — Progress Notes (Signed)
Patient ID: Norma Clay, female   DOB: 06/03/1950, 67 y.o.   MRN: [id number]   Patient with recent labs showing low PTH and low calcium consistent with hyperparathyroidism.  Patient has a history of thyroidectomy and her parathyroid glands are likely removed as well during the surgery.  Patient's son will be contacted to find out patient's current dose of calcium and vitamin D and also advised to take 2 Tums/calcium carbonate twice daily.  Order will be placed for lab visit to repeat calcium level and vitamin D level later this month.  Further suggestions regarding calcium and vitamin D replacement will be made once nurse has been able to contact the patient's son to find out what she is currently taking regarding calcium and vitamin D supplements and based on results of upcoming labs.

## 2017-12-07 NOTE — Progress Notes (Addendum)
Name: Norma Clay  MRN/ DOB: [id number], [birth year]    Age/ Sex: 67 y.o., female    PCP: Cain Saupe, MD   Reason for Endocrinology Evaluation: Post-surgical hypoparathyroidism      Date of Initial Endocrinology Evaluation: 12/08/2017     HPI: Norma Clay is a 67 y.o. female with a past medical history of T2DM . The patient presented for initial endocrinology clinic visit on 12/08/2017 for consultative assistance with her hypothyroidism.   She is accompanied by her son [place]. An interpretor is also present. She  moved from [place] ~ 4 months ago. 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 She denies history of thyroid cancer. Pt noted hand spasms following 2nd surgery and has been on Calcium and Calcitriol since.   As per son and patient, she was having issues with renal function secondary to calcitriol and this dose has been decreased ~ 6 months ago.  Currently on Calcium Carbonate 500 mg TID and Calcitriol 0.5 mcg daily   Pt admits to non-compliance with her 3rd dose of calcium until a couple of weeks ago. She did feel tired with no energy when she wasn't taking the 3rd dose.   She denies any prior hx of renal stones. She is not on HCTZ.     HISTORY:  Past Medical History:  Past Medical History:  Diagnosis Date  . Diabetes mellitus without complication (HCC)   . Glaucoma   . Hypertension    Past Surgical History:  Past Surgical History:  Procedure Laterality Date  . GLAUCOMA SURGERY    . THYROIDECTOMY        Social History:  reports that she has never smoked. She has never used smokeless tobacco. She reports that she does not drink alcohol or use drugs.  Family History: family history includes Diabetes in her mother.   HOME MEDICATIONS: Current Outpatient Medications on File Prior to Visit  Medication Sig Dispense Refill  . amLODipine (NORVASC) 5 MG tablet Take 5 mg by mouth  daily.    Marland Kitchen atorvastatin (LIPITOR) 20 MG tablet Take 20 mg by mouth daily.    . Calcium Carbonate-Vitamin D (OYSTERCAL 500 + D PO) Take 1 tablet by mouth 3 (three) times daily.    . COBALAMINE COMBINATIONS PO Take 500 mcg by mouth. Cobal (Mecobalamin)    . Cyanocobalamin (VITAMIN B 12 PO) Take 1 tablet by mouth daily.    . dorzolamide-timolol (COSOPT) 22.3-6.8 MG/ML ophthalmic solution Place 1 drop into both eyes 2 (two) times daily. 10 mL 0  . glimepiride (AMARYL) 4 MG tablet TAKE 1 TABLET BY MOUTH DAILY BEFORE BREAKFAST. 60 tablet 0  . glucose blood test strip Use as instructed 100 each 12  . levothyroxine (SYNTHROID, LEVOTHROID) 100 MCG tablet TAKE 1 TABLET BY MOUTH DAILY BEFORE BREAKFAST. 60 tablet 3  . losartan (COZAAR) 25 MG tablet Take 1 tablet (25 mg total) by mouth daily. 60 tablet 3  . metFORMIN (GLUCOPHAGE) 850 MG tablet Take 850 mg by mouth daily with breakfast.    . nystatin ointment (MYCOSTATIN) Apply 1 application topically 2 (two) times daily. 30 g 6  . vitamin A 47829 UNIT capsule Take 10,000 Units by mouth daily.    . vitamin B-12 (CYANOCOBALAMIN) 100 MCG tablet Take 100 mcg by mouth daily.    . brimonidine (ALPHAGAN) 0.2 % ophthalmic solution Place 1 drop into both eyes 3 (three) times daily.    Marland Kitchen  pantoprazole (PROTONIX) 40 MG tablet Take 1 tablet (40 mg total) by mouth daily. (Patient not taking: Reported on 12/08/2017) 30 tablet 3   No current facility-administered medications on file prior to visit.       REVIEW OF SYSTEMS: A comprehensive ROS was conducted with the patient and is negative except as per HPI and below:  Review of Systems  Constitutional: Positive for malaise/fatigue. Negative for weight loss.  HENT: Negative for congestion and sore throat.   Respiratory: Negative for cough and shortness of breath.   Cardiovascular: Positive for chest pain. Negative for palpitations.       Son states her chest has been fine and attribute this to musculoskeletal     Gastrointestinal: Positive for constipation. Negative for nausea.  Genitourinary: Negative for frequency.  Musculoskeletal: Positive for joint pain.  Skin: Negative for rash.  Neurological: Negative for tingling and tremors.  Endo/Heme/Allergies: Negative for polydipsia.  Psychiatric/Behavioral: Negative for depression. The patient is not nervous/anxious.        OBJECTIVE:  VS: BP (!) 115/58 (BP Location: Right Arm, Patient Position: Standing, Cuff Size: Normal)   Pulse 87   Ht 5\' 4"  (1.626 m)   Wt 172 lb (78 kg)   SpO2 96%   BMI 29.52 kg/m     Wt Readings from Last 3 Encounters:  12/08/17 172 lb (78 kg)  09/27/17 171 lb (77.6 kg)  09/14/17 171 lb (77.6 kg)     EXAM: General: Pt appears well and is in NAD  Hydration: Well-hydrated with moist mucous membranes and good skin turgor  Eyes: External eye exam normal without stare, lid lag or exophthalmos.  EOM intact.  PERRL.  Ears, Nose, Throat: Hearing: Grossly intact bilaterally Dental: Good dentition  Throat: Clear without mass, erythema or exudate Chvosk's sign is negative  Neck: General: Supple without adenopathy. Thyroid: Scar tissue present. No nodules noted.   Lungs: Clear with good BS bilat with no rales, rhonchi, or wheezes  Heart: Auscultation: RRR.  Abdomen: Normoactive bowel sounds, soft, nontender, without masses or organomegaly palpable  Extremities: BL LE: No pretibial edema normal ROM and strength.  Skin: Hair: Texture and amount normal with gender appropriate distribution Skin Inspection: No rashes. Skin Palpation: Skin temperature, texture, and thickness normal to palpation  Neuro: Cranial nerves: II - XII grossly intact  Motor: Normal strength throughout DTRs: 2+ and symmetric in UE without delay in relaxation phase  Mental Status: Judgment, insight: Intact Orientation: Oriented to time, place, and person Mood and affect: No depression, anxiety, or agitation     DATA REVIEWED:   Results for  Norma Clay, Norma Clay (MRN [id number]) as of 12/08/2017 15:58  Ref. Range 09/14/2017 11:07 11/24/2017 09:28 12/08/2017 10:11  Calcium Latest Ref Range: 8.4 - 10.5 mg/dL 7.7 (L) 7.2 (L) 7.6 (L)  Results for Norma Clay, Norma Clay (MRN [id number]) as of 12/08/2017 15:58  Ref. Range 12/08/2017 10:11  Sodium Latest Ref Range: 135 - 145 mEq/L 137  Potassium Latest Ref Range: 3.5 - 5.1 mEq/L 3.8  Chloride Latest Ref Range: 96 - 112 mEq/L 103  CO2 Latest Ref Range: 19 - 32 mEq/L 23  Glucose Latest Ref Range: 70 - 99 mg/dL 782266 (H)  BUN Latest Ref Range: 6 - 23 mg/dL 24 (H)  Creatinine Latest Ref Range: 0.40 - 1.20 mg/dL 9.560.98  Calcium Latest Ref Range: 8.4 - 10.5 mg/dL 7.6 (L)  Albumin Latest Ref Range: 3.5 - 5.2 g/dL 4.0  GFR Latest Ref Range: >60.00 mL/min 59.82 (L)  Results for Norma Clay,  Norma Clay (MRN [id number]) as of 12/08/2017 15:58  Ref. Range 12/08/2017 10:11  TSH Latest Ref Range: 0.35 - 4.50 uIU/mL 1.98    ASSESSMENT/PLAN/RECOMMENDATIONS:   1. Surgical Hypoparathyroidism :   - The goals of therapy in patients with hypoparathyroidism are to relieve symptoms, to raise and maintain the serum calcium concentration in the low-normal range( 8.0 to 9 mg/dL), and to prevent iatrogenic development of kidney stones. Attainment of higher serum calcium values is not necessary and is usually limited by the development of hypercalciuria due to the loss of renal calcium-retaining effects of parathyroid hormone . For adults with stable chronic hypoparathyroidism, the dose of oral calcium is typically 1 to 2 g of elemental calcium daily, in divided doses.   Calcitriol is the vitamin D metabolite of choice because it does not require renal activation, it has a rapid onset of action (hours), and a shorter half-life.  The major side effects of calcium and vitamin D replacement in patients with hypoparathyroidism are hypercalcemia and hypercalciuria, which, if chronic, can cause nephrolithiasis, nephrocalcinosis, and renal failure.  Hypercalciuria is the earliest sign of toxicity and can develop in the absence of hypercalcemia.   To prevent these complications, urinary calcium excretion should be measured periodically and the dose of calcium and vitamin D reduced if it is elevated (?300 mg in 24 hours).  - Will increase calcium carbonate  500 mg to Two Tablets twice a day with meals.  - Continue Calcitriol 0.5 mcg daily  - Once the dose is stable, will obtain 24-hr urine collection to check calcium excretion    2. Surgical Hypothyroidism :  - Pt is clinically and bio chemically euthyroid  - Continue Levothyroxine 100 mcg daily     F/u in 6 weeks   Addendum: Discussed results and above changes with the son. Will recheck in 2 weeks     Signed electronically by: Lyndle Herrlich, MD  [facility]  [facility] [address] [place], [place] [address] Phone: [phone] FAX: [phone]   CC: Cain Saupe, MD [address] [place] [address] Phone: [phone] Fax: [phone]   Return to Endocrinology clinic as below: Future Appointments  Date Time Provider Department Center  12/14/2017 10:10 AM Cain Saupe, MD [facility] None  01/19/2018  9:30 AM Viraaj Vorndran, Konrad Dolores, MD [id number] None

## 2017-12-08 ENCOUNTER — Ambulatory Visit (INDEPENDENT_AMBULATORY_CARE_PROVIDER_SITE_OTHER): Payer: Self-pay | Admitting: Internal Medicine

## 2017-12-08 ENCOUNTER — Encounter: Payer: Self-pay | Admitting: Internal Medicine

## 2017-12-08 VITALS — BP 115/58 | HR 87 | Ht 64.0 in | Wt 172.0 lb

## 2017-12-08 DIAGNOSIS — E209 Hypoparathyroidism, unspecified: Secondary | ICD-10-CM

## 2017-12-08 LAB — BASIC METABOLIC PANEL
BUN: 24 mg/dL — AB (ref 6–23)
CO2: 23 mEq/L (ref 19–32)
CREATININE: 0.98 mg/dL (ref 0.40–1.20)
Calcium: 7.6 mg/dL — ABNORMAL LOW (ref 8.4–10.5)
Chloride: 103 mEq/L (ref 96–112)
GFR: 59.82 mL/min — ABNORMAL LOW (ref >60.00)
GLUCOSE: 266 mg/dL — AB (ref 70–99)
Potassium: 3.8 mEq/L (ref 3.5–5.1)
SODIUM: 137 meq/L (ref 135–145)

## 2017-12-08 LAB — TSH: TSH: 1.98 u[IU]/mL (ref 0.35–4.50)

## 2017-12-08 LAB — ALBUMIN: ALBUMIN: 4 g/dL (ref 3.5–5.2)

## 2017-12-08 MED ORDER — CALCITRIOL 0.25 MCG PO CAPS: 0.5000 ug | ORAL_CAPSULE | Freq: Every day | ORAL | 6 refills | Status: DC

## 2017-12-08 MED FILL — CALCITRIOL 0.25 MCG CAPS: 0.25 | 30 days supply | Qty: 60 | Fill #0

## 2017-12-08 NOTE — Patient Instructions (Signed)
-   Continue calcium Carbonate 500 mg three times a day with meals  - Continue Calcitriol 0.5 mcg daily (active Vitamin D ) daily

## 2017-12-14 ENCOUNTER — Ambulatory Visit: Payer: Self-pay | Attending: Family Medicine | Admitting: Family Medicine

## 2017-12-14 ENCOUNTER — Ambulatory Visit (HOSPITAL_COMMUNITY)
Admission: RE | Admit: 2017-12-14 | Discharge: 2017-12-14 | Disposition: A | Discharge status: Home / Self Care | Payer: Self-pay | Source: Ambulatory Visit | Attending: Family Medicine | Admitting: Family Medicine

## 2017-12-14 ENCOUNTER — Encounter: Payer: Self-pay | Admitting: Family Medicine

## 2017-12-14 VITALS — BP 129/83 | HR 87 | Temp 98.3°F | Resp 18 | Ht 66.0 in | Wt 172.0 lb

## 2017-12-14 DIAGNOSIS — M545 Low back pain, unspecified: Secondary | ICD-10-CM

## 2017-12-14 DIAGNOSIS — Z79899 Other long term (current) drug therapy: Secondary | ICD-10-CM | POA: Insufficient documentation

## 2017-12-14 DIAGNOSIS — E890 Postprocedural hypothyroidism: Secondary | ICD-10-CM | POA: Insufficient documentation

## 2017-12-14 DIAGNOSIS — Z7984 Long term (current) use of oral hypoglycemic drugs: Secondary | ICD-10-CM | POA: Insufficient documentation

## 2017-12-14 DIAGNOSIS — R319 Hematuria, unspecified: Secondary | ICD-10-CM

## 2017-12-14 DIAGNOSIS — M549 Dorsalgia, unspecified: Secondary | ICD-10-CM

## 2017-12-14 DIAGNOSIS — Z833 Family history of diabetes mellitus: Secondary | ICD-10-CM | POA: Insufficient documentation

## 2017-12-14 DIAGNOSIS — R1012 Left upper quadrant pain: Secondary | ICD-10-CM

## 2017-12-14 DIAGNOSIS — E039 Hypothyroidism, unspecified: Secondary | ICD-10-CM

## 2017-12-14 DIAGNOSIS — Z79890 Hormone replacement therapy (postmenopausal): Secondary | ICD-10-CM | POA: Insufficient documentation

## 2017-12-14 DIAGNOSIS — E209 Hypoparathyroidism, unspecified: Secondary | ICD-10-CM

## 2017-12-14 DIAGNOSIS — E785 Hyperlipidemia, unspecified: Secondary | ICD-10-CM

## 2017-12-14 DIAGNOSIS — M546 Pain in thoracic spine: Secondary | ICD-10-CM | POA: Insufficient documentation

## 2017-12-14 DIAGNOSIS — Z880 Allergy status to penicillin: Secondary | ICD-10-CM | POA: Insufficient documentation

## 2017-12-14 DIAGNOSIS — E119 Type 2 diabetes mellitus without complications: Secondary | ICD-10-CM

## 2017-12-14 DIAGNOSIS — Z881 Allergy status to other antibiotic agents status: Secondary | ICD-10-CM | POA: Insufficient documentation

## 2017-12-14 DIAGNOSIS — I10 Essential (primary) hypertension: Secondary | ICD-10-CM

## 2017-12-14 DIAGNOSIS — E1169 Type 2 diabetes mellitus with other specified complication: Secondary | ICD-10-CM

## 2017-12-14 DIAGNOSIS — E1165 Type 2 diabetes mellitus with hyperglycemia: Secondary | ICD-10-CM

## 2017-12-14 LAB — POCT URINALYSIS DIP (CLINITEK)
Bilirubin, UA: NEGATIVE
Glucose, UA: 500 mg/dL — AB
Ketone: NEGATIVE
Leukocytes, UA: NEGATIVE
Nitrite, UA: NEGATIVE
POC PROTEIN,UA: NEGATIVE
Spec Grav, UA: 1.015
Urobilinogen, UA: 0.2 U/dL
pH, UA: 5

## 2017-12-14 LAB — POCT GLYCOSYLATED HEMOGLOBIN (HGB A1C): HbA1c, POC (controlled diabetic range): 8.6 % — AB (ref 0.0–7.0)

## 2017-12-14 MED ORDER — AMLODIPINE BESYLATE 5 MG PO TABS: 5.0000 mg | ORAL_TABLET | Freq: Every day | ORAL | 6 refills | Status: DC

## 2017-12-14 MED ORDER — METFORMIN HCL 850 MG PO TABS: 850.0000 mg | ORAL_TABLET | Freq: Two times a day (BID) | ORAL | 6 refills | Status: DC

## 2017-12-14 MED ORDER — ATORVASTATIN CALCIUM 20 MG PO TABS: 20.0000 mg | ORAL_TABLET | Freq: Every day | ORAL | 6 refills | Status: DC

## 2017-12-14 MED ORDER — LEVOTHYROXINE SODIUM 100 MCG PO TABS: ORAL_TABLET | ORAL | 3 refills | Status: DC

## 2017-12-14 MED ORDER — GLIMEPIRIDE 4 MG PO TABS: 4.0000 mg | ORAL_TABLET | Freq: Every day | ORAL | 6 refills | Status: DC

## 2017-12-14 MED ORDER — LOSARTAN POTASSIUM 25 MG PO TABS: 25.0000 mg | ORAL_TABLET | Freq: Every day | ORAL | 3 refills | Status: DC

## 2017-12-14 MED ORDER — NITROFURANTOIN MONOHYD MACRO 100 MG PO CAPS: 100.0000 mg | ORAL_CAPSULE | Freq: Two times a day (BID) | ORAL | 0 refills | Status: AC

## 2017-12-14 MED FILL — LOSARTAN POTASSIUM 25 MG TA: 25 | 30 days supply | Qty: 30 | Fill #0

## 2017-12-14 MED FILL — GLIMEPIRIDE 4 MG TABS: 4 | 30 days supply | Qty: 30 | Fill #0

## 2017-12-14 MED FILL — LEVOTHYROXINE 100 MCG TAB: 100 | 30 days supply | Qty: 30 | Fill #0

## 2017-12-14 MED FILL — NITROFURANTOIN MONO-MCR 100: 100 | 7 days supply | Qty: 14 | Fill #0

## 2017-12-14 MED FILL — ATORVASTATIN 20 MG TABLET: 20 | 30 days supply | Qty: 30 | Fill #0

## 2017-12-14 MED FILL — AMLODIPINE BESYLATE 5 MG TA: 5 | 30 days supply | Qty: 30 | Fill #0

## 2017-12-14 MED FILL — metFORMIN HCL 850 MG TABS: 850 | 30 days supply | Qty: 60 | Fill #0

## 2017-12-14 NOTE — Progress Notes (Signed)
Subjective:    Patient ID: Norma Clay, female    DOB: Feb 03, 1950, 67 y.o.   MRN: [id number]  HPI       67 yo female seen in follow-up of diabetes, patient is also status post thyroidectomy and has hypoparathyroidism and hypothyroidism.  Patient is recently seen endocrinology regarding hypothyroidism/hypoparathyroidism.  Patient son reports that patient has been taking calcium supplement but only 3 times daily.  Patient states that she does not feel that she needs to take 4 pills as recommended.  Patient believes that taking 4 pills might cause her to not feel well.  Patient also with complaint of some mild constipation with the use of calcium.  Patient per son has had complaint of back pain over the past 2 or more weeks and has also had complain of abdominal pain for more than 1 week.  Patient cannot really describe the pain she is having in her abdomen.  Patient places her hand over the left upper quadrant and left flank area to show the area of pain.  Patient denies any nausea.  Son also reports that patient has had several years of complain of pain in her throat but patient denies any issues with swallowing or sensation of food getting stuck in her throat with swallowing.  Patient has had no recent fever or chills, no cough.      Per son, patient has had no issues with blood sugars becoming too low.  Patient's fasting blood sugars are generally 120 or less.  Within 2 hours of a meal, blood sugars are in the 160s to 180s.  Patient denies increased thirst.  Patient per son does have some visual issues but has a diagnosis of glaucoma and recently had a change in the type of eyedrops that she uses.  Patient does have some occasional urinary frequency/urgency.  Patient continues to take her blood pressure medications and blood pressure has been well controlled.  Patient continues to take her cholesterol medication and has no increase complain of muscle or joint pain other than recent increase in back pain.   Patient son states that patient was scheduled to have a bone density test in the past but did not have this test done and patient son would like to have this test rescheduled.  Past Medical History:  Diagnosis Date  . Diabetes mellitus without complication (HCC)   . Glaucoma   . Hypertension    Past Surgical History:  Procedure Laterality Date  . GLAUCOMA SURGERY    . THYROIDECTOMY     Family History  Problem Relation Age of Onset  . Diabetes Mother    Social History   Tobacco Use  . Smoking status: Never Smoker  . Smokeless tobacco: Never Used  Substance Use Topics  . Alcohol use: No  . Drug use: No   Allergies  Allergen Reactions  . Clindamycin/Lincomycin Rash  . Penicillins Rash    Patient was placed on a form of penicillin by her dentist and developed a rash therefore medication was changed to clindamycin.  Per son, patient is not allergic to clindamycin     Review of Systems  Constitutional: Positive for fatigue. Negative for chills and fever.  HENT: Negative for sore throat and trouble swallowing.   Eyes: Positive for visual disturbance. Negative for pain.  Respiratory: Negative for cough and shortness of breath.   Cardiovascular: Negative for chest pain and leg swelling.  Gastrointestinal: Positive for abdominal pain and constipation. Negative for blood in stool, diarrhea and  nausea.  Genitourinary: Positive for flank pain and frequency (Occasional). Negative for dysuria.  Musculoskeletal: Positive for back pain. Negative for gait problem.  Neurological: Negative for dizziness and headaches.  Hematological: Negative for adenopathy. Does not bruise/bleed easily.       Objective:   Physical Exam BP 129/83 (BP Location: Right Arm, Patient Position: Sitting, Cuff Size: Normal)   Pulse 87   Temp 98.3 F (36.8 C) (Oral)   Resp 18   Ht 5\' 6"  (1.676 m)   Wt 172 lb (78 kg)   SpO2 98%   BMI 27.76 kg/m  Nurse's notes and vital signs  reviewed General-well-nourished, well-developed older female in no acute distress.  Patient is accompanied by her son at today's visit.  Patient's son acts as interpreter. ENT-TMs dull, nares with mild edema of the nasal turbinates, normal oropharynx Neck-supple, no lymphadenopathy, no thyromegaly-patient status post thyroidectomy, no reproducible neck tenderness. Lungs-clear to auscultation bilaterally Cardiovascular-regular rate and rhythm Abdomen- mild abdominal distention, soft, patient with left upper quadrant/left flank discomfort to palpation, no rebound or guarding.  Mild suprapubic discomfort to palpation Back- patient with CVA tenderness right greater than left.  Patient with thoracic spine tenderness to palpation and possible area of deformity.  Patient with mild lumbosacral discomfort to palpation Extremities- no edema Feet- patient's feet are slightly cool to touch, no evidence of cyanosis, no active skin breakdown on the feet, poorly palpable peripheral pulses (patient has had vascular evaluation since her last visit which showed no significant abnormalities)      Assessment & Plan:  1. Type 2 diabetes mellitus without complication, without long-term current use of insulin (HCC) Patient's hemoglobin A1c was slightly elevated at 8.6 as compared to prior A1c of 7.4.  Patient's son states that perhaps his mother has been less active.  Patient will have increased dose of Glucophage from once daily to twice daily.  Patient has had recent BMP done on 12/08/2017 with normal creatinine of 0.98.  Patient also provided with refill of Amaryl.  Patient has had no hypoglycemic episodes. - HgB A1c - POCT URINALYSIS DIP (CLINITEK) - glimepiride (AMARYL) 4 MG tablet; Take 1 tablet (4 mg total) by mouth daily with breakfast.  Dispense: 30 tablet; Refill: 6 - metFORMIN (GLUCOPHAGE) 850 MG tablet; Take 1 tablet (850 mg total) by mouth 2 (two) times daily with a meal.  Dispense: 60 tablet; Refill:  6  2. Essential hypertension Patient's blood pressure appears to be well controlled on amlodipine 5 mg and Cozaar 25 mg which were refilled at today's visit and patient is to continue these medications. - amLODipine (NORVASC) 5 MG tablet; Take 1 tablet (5 mg total) by mouth daily.  Dispense: 30 tablet; Refill: 6 - losartan (COZAAR) 25 MG tablet; Take 1 tablet (25 mg total) by mouth daily.  Dispense: 60 tablet; Refill: 3  3. Mid-back pain, acute Patient with acute mid back pain/CVA tenderness and patient will have urinalysis to look for possible urinary tract infection.  Patient is also on a calcium supplement secondary to hypoparathyroidism which may increase her risk for kidney stones.  Patient will also have KUB done as patient is also having some left flank/left upper abdominal discomfort. - DG Thoracic Spine 2 View; Future - DG Abd 1 View; Future - DG BONE DENSITY (DXA); Future  4. Left upper quadrant pain Patient with some left upper quadrant pain and left flank pain.  Patient is also on calcium supplements secondary to hypoparathyroidism and patient will be sent for KUB  to look for kidney stone or other abnormality such as constipation which may be picked up on plain abdominal film.  Patient should seek emergency department evaluation if she has any worsening of abdominal pain or any concerns. - DG Abd 1 View; Future  5. Hypocalcemia Patient with hypokalemia related to her hypoparathyroidism and son reports that he would like to have bone density test scheduled as he never followed up with previously scheduled bone density scan.  Patient does have some tenderness over the thoracic spine at today's visit and patient is at increased risk for vertebral compression fractures secondary to her hypocalcemia - DG BONE DENSITY (DXA); Future  6. Hypoparathyroidism, unspecified hypoparathyroidism type [facility](HCC) Patient has recently seen endocrinology.  Per son, patient is taking calcium carbonate, 1  tablet 3 times daily but did not feel the need to increase this to 2 pills twice daily as per endocrinology suggestion.  Patient with low calcium due to hypoparathyroidism and son would like to have rescheduling of her DEXA scan as patient is at increased risk of osteo-pia/osteoporosis - DG BONE DENSITY (DXA); Future  7. Midline low back pain without sciatica, unspecified chronicity Patient will be scheduled for lumbar spine films secondary to her lumbosacral discomfort on examination.  Patient additionally is having thoracic spine films secondary to mid back pain.  Patient will also have urinalysis. - DG Lumbar Spine Complete; Future - DG BONE DENSITY (DXA); Future  8. Hematuria, unspecified type Patient with hematuria on urinalysis.  Patient's urine will be sent for culture and patient will be placed on Macrobid in the interim as patient also had suprapubic discomfort to palpation.  Patient will also obtain KUB to look for possible kidney stone due to patient use of calcium supplements - Urine Culture - nitrofurantoin, macrocrystal-monohydrate, (MACROBID) 100 MG capsule; Take 1 capsule (100 mg total) by mouth 2 (two) times daily for 7 days. For urinary tract infection  Dispense: 14 capsule; Refill: 0   9. Acquired hypothyroidism Patient has had recent normal TSH.  Patient provided with a refill of levothyroxine - levothyroxine (SYNTHROID, LEVOTHROID) 100 MCG tablet; TAKE 1 TABLET BY MOUTH DAILY BEFORE BREAKFAST.  Dispense: 60 tablet; Refill: 3  11. Hyperlipidemia associated with type 2 diabetes mellitus (HCC) Refill provided for atorvastatin 20 mg for treatment of hyperlipidemia.  Patient should continue low fat diet - atorvastatin (LIPITOR) 20 MG tablet; Take 1 tablet (20 mg total) by mouth daily.  Dispense: 30 tablet; Refill: 6  An After Visit Summary was printed and given to the patient.  Return in about 2 weeks (around 12/28/2017) for Abdominal pain/back pain-2 weeks if not better.

## 2017-12-16 LAB — URINE CULTURE: Organism ID, Bacteria: NO GROWTH

## 2017-12-19 MED FILL — TRAVATAN Z 0.004% EYE DROP: 0.004 | 18 days supply | Qty: 3 | Fill #1

## 2017-12-19 MED FILL — DORZOLAMIDE-TIMOLOL EYE DRP: 22.3-6.8 | 56 days supply | Qty: 10 | Fill #1

## 2017-12-19 MED FILL — BRIMONIDINE 0.2% EYE DROP: 0.2 | 36 days supply | Qty: 10 | Fill #1

## 2017-12-20 ENCOUNTER — Telehealth: Payer: Self-pay | Admitting: *Deleted

## 2017-12-20 NOTE — Telephone Encounter (Signed)
Medical Assistant used [employer] to contact patient.  Interpreter Name: Ahmed  Interpreter #: [phone] Patients son was very rude with the interpreter.  Interpreter had to disconnect and son refused interpreter. Patients son is aware of abdominal xray being normal and the spinal xray having arthritic changes be noted. No further questions.

## 2017-12-20 NOTE — Telephone Encounter (Signed)
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

## 2017-12-22 ENCOUNTER — Other Ambulatory Visit (INDEPENDENT_AMBULATORY_CARE_PROVIDER_SITE_OTHER): Payer: Self-pay

## 2017-12-22 ENCOUNTER — Telehealth: Payer: Self-pay | Admitting: Family Medicine

## 2017-12-22 DIAGNOSIS — E209 Hypoparathyroidism, unspecified: Secondary | ICD-10-CM

## 2017-12-22 LAB — CALCIUM: CALCIUM: 7.8 mg/dL — AB (ref 8.4–10.5)

## 2017-12-22 NOTE — Telephone Encounter (Signed)
Patient son came in to get lab results for his mother. Please follow up.

## 2017-12-22 NOTE — Telephone Encounter (Signed)
Patients son received labs over the phone and a note is already in the system. You may print the labs if they return.

## 2017-12-25 ENCOUNTER — Telehealth: Payer: Self-pay | Admitting: Internal Medicine

## 2017-12-25 MED ORDER — CALCITRIOL 0.25 MCG PO CAPS: 0.7500 ug | ORAL_CAPSULE | Freq: Every day | ORAL | 6 refills | Status: DC

## 2017-12-25 NOTE — Telephone Encounter (Addendum)
Left a message for a call back at 12:18 pm    Son called back Mr. Eula ListenHussain   Discussed improvement in serum calcium , now at 7.8 mg/dL.  Son states , patient has not been taking 4 tablets of calcium carbonate, she only takes 3 and refuses to take 4 due to subjective side effects   Recommendations - Calcium carbonate 500 mg TID  - Increase Calcitriol to 0.75 mcg daily  - Will recheck on next visit     Hussain expressed understanding   Abby Raelyn MoraJaralla Vilma Will, MD  [facility]  [facility] [address] [place], [place] [address] Phone: [phone] FAX: [phone]

## 2018-01-09 MED FILL — GLIMEPIRIDE 4 MG TABS: 4 | 30 days supply | Qty: 30 | Fill #1

## 2018-01-09 MED FILL — AMLODIPINE BESYLATE 5 MG TA: 5 | 30 days supply | Qty: 30 | Fill #1

## 2018-01-09 MED FILL — LEVOTHYROXINE 100 MCG TAB: 100 | 30 days supply | Qty: 30 | Fill #1

## 2018-01-09 MED FILL — ATORVASTATIN 20 MG TABLET: 20 | 30 days supply | Qty: 30 | Fill #1

## 2018-01-09 MED FILL — LOSARTAN POTASSIUM 25 MG TA: 25 | 30 days supply | Qty: 30 | Fill #1

## 2018-01-09 MED FILL — metFORMIN HCL 850 MG TABS: 850 | 30 days supply | Qty: 60 | Fill #1

## 2018-01-10 MED FILL — CALCITRIOL 0.25 MCG CAPS: 0.25 | 30 days supply | Qty: 60 | Fill #1

## 2018-01-12 ENCOUNTER — Encounter: Payer: Self-pay | Admitting: Internal Medicine

## 2018-01-12 ENCOUNTER — Ambulatory Visit (INDEPENDENT_AMBULATORY_CARE_PROVIDER_SITE_OTHER): Payer: Self-pay | Admitting: Internal Medicine

## 2018-01-12 VITALS — BP 126/70 | Ht 66.0 in | Wt 169.4 lb

## 2018-01-12 DIAGNOSIS — E209 Hypoparathyroidism, unspecified: Secondary | ICD-10-CM

## 2018-01-12 LAB — BASIC METABOLIC PANEL
BUN: 24 mg/dL — ABNORMAL HIGH (ref 6–23)
CHLORIDE: 100 meq/L (ref 96–112)
CO2: 26 meq/L (ref 19–32)
Calcium: 10.3 mg/dL (ref 8.4–10.5)
Creatinine, Ser: 0.94 mg/dL (ref 0.40–1.20)
GFR: 62.75 mL/min (ref >60.00)
GLUCOSE: 218 mg/dL — AB (ref 70–99)
Potassium: 4.1 mEq/L (ref 3.5–5.1)
Sodium: 135 mEq/L (ref 135–145)

## 2018-01-12 LAB — ALBUMIN: Albumin: 4 g/dL (ref 3.5–5.2)

## 2018-01-12 LAB — VITAMIN D 25 HYDROXY (VIT D DEFICIENCY, FRACTURES): VITD: 49.96 ng/mL (ref 30.00–100.00)

## 2018-01-12 MED ORDER — CALCITRIOL 0.25 MCG PO CAPS: 0.5000 ug | ORAL_CAPSULE | Freq: Every day | ORAL | 6 refills | Status: DC

## 2018-01-12 MED ORDER — CALCIUM CARBONATE-VITAMIN D 500-200 MG-UNIT PO TABS: 1.0000 | ORAL_TABLET | Freq: Three times a day (TID) | ORAL | 11 refills | Status: DC

## 2018-01-12 NOTE — Progress Notes (Signed)
Name: Norma Clay  MRN/ DOB: [id number], Feb 16, 1950    Age/ Sex: 68 y.o., female     PCP: Cain Saupe, MD   Reason for Endocrinology Evaluation: Post-surgical hypoparathyroidism     Initial Endocrinology Clinic Visit: 12/08/17    PATIENT IDENTIFIER: [name] is a 68 y.o., female with a past medical history of Total thyroidectomy and T2DM. She has followed with [facility] since 12/08/2017 for consultative assistance with management of her Post-surgical hypoparathyroidism.   HISTORICAL SUMMARY:  She  moved from [place] in 2019. 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 She denies history of thyroid cancer. Pt noted hand spasms following 2nd surgery and has been on Calcium and Calcitriol since.  Of note the patient is not compliant with intake of her medications.   SUBJECTIVE:   During last visit (12/08/2017): Her serum calcium was 7.6 mg/dL. We increased calcium carbonate to 100 mg BID and continue Calcitriol at 0.5 mcg daily   Today (01/12/2018):  Norma Clay is here with her son Norma Clay and Landscape architect the interpreter. and interpreter. She is here for a follow up on hypocalcemia. Since her last visit , she has another serum calcium at 7.8 mg/dL, as per son at the time the patient is noncompliant with calcium intake, we have decided to keep her calcium carbonate at 3 tablets daily and increase her calcitriol to 0.75 MCG's daily. Today her energy level is the same she denies any shortness of breath, she denies any numbness or perioral tingling, she denies any carpopedal spasms. Patient is compliant with taking calcium carbonate 3 tablets daily, and 0.75 MCG's of calcitriol daily.   ROS:  As per HPI.   HISTORY:  Past Medical History:  Past Medical History:  Diagnosis Date  . Diabetes mellitus without complication (HCC)   . Glaucoma   . Hypertension    Past Surgical History:    Past Surgical History:  Procedure Laterality Date  . GLAUCOMA SURGERY    . THYROIDECTOMY      Social History:  reports that she has never smoked. She has never used smokeless tobacco. She reports that she does not drink alcohol or use drugs.  Family History: family history includes Diabetes in her mother.   HOME MEDICATIONS: Allergies as of 01/12/2018      Reactions   Clindamycin/lincomycin Rash   Penicillins Rash   Patient was placed on a form of penicillin by her dentist and developed a rash therefore medication was changed to clindamycin.  Per son, patient is not allergic to clindamycin      Medication List       Accurate as of January 12, 2018 12:26 PM. Always use your most recent med list.        amLODipine 5 MG tablet Commonly known as:  NORVASC Take 1 tablet (5 mg total) by mouth daily.   atorvastatin 20 MG tablet Commonly known as:  LIPITOR Take 1 tablet (20 mg total) by mouth daily.   brimonidine 0.2 % ophthalmic solution Commonly known as:  ALPHAGAN Place 1 drop into both eyes 3 (three) times daily.   calcitRIOL 0.25 MCG capsule Commonly known as:  ROCALTROL Take 3 capsules (0.75 mcg total) by mouth daily.   calcium-vitamin D 500-200 MG-UNIT tablet Commonly known as:  OSCAL WITH D Take 1 tablet by mouth 3 (three) times daily.   COBALAMINE COMBINATIONS PO Take 500 mcg by mouth.  Cobal (Mecobalamin)   dorzolamide-timolol 22.3-6.8 MG/ML ophthalmic solution Commonly known as:  COSOPT Place 1 drop into both eyes 2 (two) times daily.   glimepiride 4 MG tablet Commonly known as:  AMARYL Take 1 tablet (4 mg total) by mouth daily with breakfast.   glucose blood test strip Use as instructed   levothyroxine 100 MCG tablet Commonly known as:  SYNTHROID, LEVOTHROID TAKE 1 TABLET BY MOUTH DAILY BEFORE BREAKFAST.   losartan 25 MG tablet Commonly known as:  COZAAR Take 1 tablet (25 mg total) by mouth daily.   metFORMIN 850 MG tablet Commonly known as:   GLUCOPHAGE Take 1 tablet (850 mg total) by mouth 2 (two) times daily with a meal.   nystatin ointment Commonly known as:  MYCOSTATIN Apply 1 application topically 2 (two) times daily.   pantoprazole 40 MG tablet Commonly known as:  PROTONIX Take 1 tablet (40 mg total) by mouth daily.   vitamin A 4098110000 UNIT capsule Take 10,000 Units by mouth daily.   vitamin B-12 100 MCG tablet Commonly known as:  CYANOCOBALAMIN Take 100 mcg by mouth daily.   VITAMIN B 12 PO Take 1 tablet by mouth daily.         OBJECTIVE:   PHYSICAL EXAM: VS: BP 126/70 (BP Location: Left Arm, Patient Position: Sitting, Cuff Size: Normal)   Ht 5\' 6"  (1.676 m)   Wt 169 lb 6.4 oz (76.8 kg)   SpO2 98%   BMI 27.34 kg/m    EXAM: General: Pt appears well and is in NAD  Lungs: Clear with good BS bilat with no rales, rhonchi, or wheezes  Heart: Auscultation: RRR.  Abdomen: Normoactive bowel sounds, soft, nontender, without masses or organomegaly palpable  Extremities:  BL LE: No pretibial edema normal ROM and strength.  Skin: Hair: Texture and amount normal with gender appropriate distribution Skin Inspection: No rashes. Skin Palpation: Skin temperature, texture, and thickness normal to palpation  Neuro: Cranial nerves: II - XII grossly intact  Motor: Normal strength throughout  Mental Status: Judgment, insight: Intact Orientation: Oriented to time, place, and person Mood and affect: No depression, anxiety, or agitation     DATA REVIEWED:   Results for Norma Clay, Norma Clay (MRN [id number]) as of 01/12/2018 15:33  Ref. Range 01/12/2018 09:57  Sodium Latest Ref Range: 135 - 145 mEq/L 135  Potassium Latest Ref Range: 3.5 - 5.1 mEq/L 4.1  Chloride Latest Ref Range: 96 - 112 mEq/L 100  CO2 Latest Ref Range: 19 - 32 mEq/L 26  Glucose Latest Ref Range: 70 - 99 mg/dL 621218 (H)  BUN Latest Ref Range: 6 - 23 mg/dL 24 (H)  Creatinine Latest Ref Range: 0.40 - 1.20 mg/dL 3.080.94  Calcium Latest Ref Range: 8.4 - 10.5  mg/dL 65.710.3  Albumin Latest Ref Range: 3.5 - 5.2 g/dL 4.0  GFR Latest Ref Range: >60.00 mL/min 62.75  VITD Latest Ref Range: 30.00 - 100.00 ng/mL 49.96    ASSESSMENT / PLAN / RECOMMENDATIONS:   1. Surgical Hypoparathyroidism :   - The goals of therapy in patients with hypoparathyroidism are to relieve symptoms, to raise and maintain the serum calcium concentration in the low-normal range( 8.0 to 9 mg/dL), and to prevent iatrogenic development of kidney stones. Attainment of higher serum calcium values is not necessary and is usually limited by the development of hypercalciuria due to the loss of renal calcium-retaining effects of parathyroid hormone .  - Calcitriol is the vitamin D metabolite of choice because it does not require renal activation,  it has a rapid onset of action (hours), and a shorter half-life.  - The major side effects of calcium and vitamin D replacement in patients with hypoparathyroidism are hypercalcemia and hypercalciuria, which, if chronic, can cause nephrolithiasis, nephrocalcinosis, and renal failure. Hypercalciuria is the earliest sign of toxicity and can develop in the absence of hypercalcemia.   - To prevent these complications, urinary calcium excretion should be measured periodically and the dose of calcium and vitamin D reduced if it is elevated (?300 mg in 24 hours).  - Based on her labs today her calcium is very high, the only change we made last time is cut her calcium from 4 tablets to 3 and increased Calcitriol from 2 to 3 capsules.  - this is a dramatic change , so will cut her calcitriol back to 0.5 mcg daily and continue same dose of calcium    Medications: Continue Calcium Carbonate 500 mg TID with meals Decrease Calcitriol 0.50 mcg daily   Labs in 3 weeks   F/u in 4 months    Addendum: Spoke to her son on 01/12/2018 @ 1530 and discussed above changes.   Signed electronically by: Lyndle Herrlich, MD  [facility]  [facility] [address] [place], [place] [address] Phone: [phone] FAX: [phone]      CC: Cain Saupe, MD [address] [place] [address] Phone: [phone]  Fax: [phone]   Return to Endocrinology clinic as below: Future Appointments  Date Time Provider Department Center  02/14/2018 10:00 AM [facility] DX DEXA 1 [facility]  05/16/2018  9:30 AM Zoua Caporaso, Konrad Dolores, MD [facility] None

## 2018-01-12 NOTE — Patient Instructions (Addendum)
-   Continue Calcium Carbonate W Vitamin D 500 mg three times a day with meals - Continue Calcitriol 0.75 mcg daily  - Once we decide on 24-hour collection, please follow below  24-Hour Urine Collection   You will be collecting your urine for a 24-hour period of time.  Your timer starts with your first urine of the morning (For example - If you first pee at 9AM, your timer will start at 9AM)  Throw away your first urine of the morning  Collect your urine every time you pee for the next 24 hours STOP your urine collection 24 hours after you started the collection (For example - You would stop at 9AM the day after you started)

## 2018-01-16 MED FILL — TRAVATAN Z 0.004% EYE DROP: 0.004 | 18 days supply | Qty: 3 | Fill #2

## 2018-01-16 MED FILL — DORZOLAMIDE-TIMOLOL EYE DRP: 22.3-6.8 | 38 days supply | Qty: 10 | Fill #2

## 2018-01-16 MED FILL — BRIMONIDINE 0.2% EYE DROP: 0.2 | 36 days supply | Qty: 10 | Fill #2

## 2018-01-19 ENCOUNTER — Ambulatory Visit: Payer: Self-pay | Admitting: Internal Medicine

## 2018-02-01 ENCOUNTER — Ambulatory Visit: Payer: Self-pay | Attending: Family Medicine | Admitting: Physician Assistant

## 2018-02-01 VITALS — BP 124/82 | HR 95 | Temp 98.6°F | Ht 66.0 in | Wt 166.8 lb

## 2018-02-01 DIAGNOSIS — H6983 Other specified disorders of Eustachian tube, bilateral: Secondary | ICD-10-CM

## 2018-02-01 DIAGNOSIS — R300 Dysuria: Secondary | ICD-10-CM

## 2018-02-01 DIAGNOSIS — E1165 Type 2 diabetes mellitus with hyperglycemia: Secondary | ICD-10-CM

## 2018-02-01 DIAGNOSIS — E119 Type 2 diabetes mellitus without complications: Secondary | ICD-10-CM

## 2018-02-01 LAB — POCT URINALYSIS DIP (CLINITEK)
Bilirubin, UA: NEGATIVE
Glucose, UA: NEGATIVE mg/dL
Ketones, POC UA: NEGATIVE mg/dL
NITRITE UA: NEGATIVE
PH UA: 5.5 (ref 5.0–8.0)
POC PROTEIN,UA: NEGATIVE
Spec Grav, UA: <=1.005 — AB (ref 1.010–1.025)
Urobilinogen, UA: 0.2 E.U./dL

## 2018-02-01 LAB — GLUCOSE, POCT (MANUAL RESULT ENTRY): POC Glucose: 202 mg/dl — AB (ref 70–99)

## 2018-02-01 MED ORDER — GLUCOSE BLOOD VI STRP: ORAL_STRIP | 12 refills | Status: DC

## 2018-02-01 MED ORDER — TRUE METRIX METER W/DEVICE KIT: 1.0000 | PACK | Freq: Two times a day (BID) | 0 refills | Status: DC

## 2018-02-01 MED ORDER — SULFAMETHOXAZOLE-TRIMETHOPRIM 800-160 MG PO TABS: 1.0000 | ORAL_TABLET | Freq: Two times a day (BID) | ORAL | 0 refills | Status: DC

## 2018-02-01 MED ORDER — FLUTICASONE PROPIONATE 50 MCG/ACT NA SUSP: 2.0000 | Freq: Every day | NASAL | 6 refills | Status: DC

## 2018-02-01 NOTE — Progress Notes (Signed)
Patient ID: Norma Clay, female   DOB: [birth year], 68 y.o.   MRN: [id number]   Norma Clay, is a 68 y.o. female  JGO:[id number]  TDH:[id number]  DOB - [birth year]  Subjective:  Chief Complaint and HPI: Norma Clay is a 68 y.o. female here today for urinary frequency and dysuria for several days.  Also sinus and ear congestion.  No fever/chills.  Needs new glucometer ROS:   Constitutional:  No f/c, No night sweats, No unexplained weight loss. EENT:  No vision changes, No blurry vision, No hearing changes. No mouth, throat, or ear problems.  Respiratory: No cough, No SOB Cardiac: No CP, no palpitations GI:  No abd pain, No N/V/D. GU: see above Musculoskeletal: No joint pain Neuro: No headache, no dizziness, no motor weakness.  Skin: No rash Endocrine:  No polydipsia. No polyuria.  Psych: Denies SI/HI  No problems updated.  ALLERGIES: Allergies  Allergen Reactions  . Clindamycin/Lincomycin Rash  . Penicillins Rash    Patient was placed on a form of penicillin by her dentist and developed a rash therefore medication was changed to clindamycin.  Per son, patient is not allergic to clindamycin    PAST MEDICAL HISTORY: Past Medical History:  Diagnosis Date  . Diabetes mellitus without complication ([place])   . Glaucoma   . Hypertension     MEDICATIONS AT HOME: Prior to Admission medications   Medication Sig Start Date End Date Taking? Authorizing Provider  amLODipine (NORVASC) 5 MG tablet Take 1 tablet (5 mg total) by mouth daily. 12/14/17  Yes Fulp, Cammie, MD  atorvastatin (LIPITOR) 20 MG tablet Take 1 tablet (20 mg total) by mouth daily. 12/14/17  Yes Fulp, Cammie, MD  brimonidine (ALPHAGAN) 0.2 % ophthalmic solution Place 1 drop into both eyes 3 (three) times daily.   Yes [provider]  calcitRIOL (ROCALTROL) 0.25 MCG capsule Take 2 capsules (0.5 mcg total) by mouth daily. 01/12/18  Yes Shamleffer, Melanie Crazier, MD  calcium-vitamin D (OSCAL WITH D) 500-200  MG-UNIT tablet Take 1 tablet by mouth 3 (three) times daily. 01/12/18  Yes Shamleffer, Melanie Crazier, MD  COBALAMINE COMBINATIONS PO Take 500 mcg by mouth. Cobal (Mecobalamin)   Yes [provider]  Cyanocobalamin (VITAMIN B 12 PO) Take 1 tablet by mouth daily.   Yes [provider]  dorzolamide-timolol (COSOPT) 22.3-6.8 MG/ML ophthalmic solution Place 1 drop into both eyes 2 (two) times daily. 04/18/16  Yes Hairston, Maylon Peppers, FNP  glimepiride (AMARYL) 4 MG tablet Take 1 tablet (4 mg total) by mouth daily with breakfast. 12/14/17  Yes Fulp, Cammie, MD  glucose blood test strip Use as instructed 02/01/18  Yes Gilda Abboud M, PA-C  levothyroxine (SYNTHROID, LEVOTHROID) 100 MCG tablet TAKE 1 TABLET BY MOUTH DAILY BEFORE BREAKFAST. 12/14/17  Yes Fulp, Cammie, MD  losartan (COZAAR) 25 MG tablet Take 1 tablet (25 mg total) by mouth daily. 12/14/17  Yes Fulp, Cammie, MD  metFORMIN (GLUCOPHAGE) 850 MG tablet Take 1 tablet (850 mg total) by mouth 2 (two) times daily with a meal. 12/14/17  Yes Fulp, Cammie, MD  nystatin ointment (MYCOSTATIN) Apply 1 application topically 2 (two) times daily. 09/14/17  Yes Fulp, Cammie, MD  vitamin A 10000 UNIT capsule Take 10,000 Units by mouth daily.   Yes [provider]  vitamin B-12 (CYANOCOBALAMIN) 100 MCG tablet Take 100 mcg by mouth daily.   Yes [provider]  Blood Glucose Monitoring Suppl (TRUE METRIX METER) w/Device KIT 1 each by Does not apply route 2 (two)  times daily. 02/01/18   Norma Donovan, PA-C  fluticasone (FLONASE) 50 MCG/ACT nasal spray Place 2 sprays into both nostrils daily. 02/01/18   Norma Donovan, PA-C  pantoprazole (PROTONIX) 40 MG tablet Take 1 tablet (40 mg total) by mouth daily. Patient not taking: Reported on 01/12/2018 09/14/17   Fulp, Ander Gaster, MD  sulfamethoxazole-trimethoprim (BACTRIM DS,SEPTRA DS) 800-160 MG tablet Take 1 tablet by mouth 2 (two) times daily. 02/01/18   Norma Donovan, PA-C      Objective:  EXAM:   Vitals:   02/01/18 1604  BP: 124/82  Pulse: 95  Temp: 98.6 F (37 C)  TempSrc: Oral  SpO2: 93%  Weight: 166 lb 12.8 oz (75.7 kg)  Height: _0  (1.676 m)    General appearance : A&OX3. NAD. Non-toxic-appearing HEENT: Atraumatic and Normocephalic.  PERRLA. EOM intact.  TM full B. Mouth-MMM, post pharynx WNL w/o erythema, No PND. Neck: supple, no JVD. No cervical lymphadenopathy. No thyromegaly Chest/Lungs:  Breathing-non-labored, Good air entry bilaterally, breath sounds normal without rales, rhonchi, or wheezing  CVS: S1 S2 regular, no murmurs, gallops, rubs  Extremities: Bilateral Lower Ext shows no edema, both legs are warm to touch with = pulse throughout Neurology:  CN II-XII grossly intact, Non focal.   Psych:  TP linear. J/I WNL. Normal speech. Appropriate eye contact and affect.  Skin:  No Rash  Data Review Lab Results  Component Value Date   HGBA1C 8.6 (A) 12/14/2017   HGBA1C 7.4 (A) 09/14/2017   HGBA1C 7.4 09/14/2017   HGBA1C 7.4 (A) 09/14/2017   HGBA1C 7.4 (A) 09/14/2017     Assessment & Plan   1. Type 2 diabetes mellitus without complication, without long-term current use of insulin (HCC) Uncontrolled-work on diet.  Continue current regimen-requests new meter.   - Glucose (CBG) - Blood Glucose Monitoring Suppl (TRUE METRIX METER) w/Device KIT; 1 each by Does not apply route 2 (two) times daily.  Dispense: 1 kit; Refill: 0 - glucose blood test strip; Use as instructed  Dispense: 100 each; Refill: 12  2. Dysuria Cover for UTI, push water.   - POCT URINALYSIS DIP (CLINITEK) - Urine Culture - sulfamethoxazole-trimethoprim (BACTRIM DS,SEPTRA DS) 800-160 MG tablet; Take 1 tablet by mouth 2 (two) times daily.  Dispense: 14 tablet; Refill: 0  3. Dysfunction of both eustachian tubes flonase      Patient have been counseled extensively about nutrition and exercise  Return in about 3 months (around 05/03/2018) for Dr Fulp-DM and  other conditions.  The patient was given clear instructions to go to ER or return to medical center if symptoms don't improve, worsen or new problems develop. The patient verbalized understanding. The patient was told to call to get lab results if they haven't heard anything in the next week.     Freeman Caldron, PA-C [facility] [place], [place]   02/01/2018, 4:47 PM

## 2018-02-02 ENCOUNTER — Other Ambulatory Visit: Payer: Self-pay | Admitting: Internal Medicine

## 2018-02-02 ENCOUNTER — Other Ambulatory Visit: Payer: Self-pay | Admitting: Endocrinology

## 2018-02-02 ENCOUNTER — Other Ambulatory Visit (INDEPENDENT_AMBULATORY_CARE_PROVIDER_SITE_OTHER): Payer: Self-pay

## 2018-02-02 DIAGNOSIS — E209 Hypoparathyroidism, unspecified: Secondary | ICD-10-CM

## 2018-02-02 LAB — BASIC METABOLIC PANEL
BUN: 24 mg/dL — ABNORMAL HIGH (ref 6–23)
CO2: 25 mEq/L (ref 19–32)
Calcium: 7.4 mg/dL — ABNORMAL LOW (ref 8.4–10.5)
Chloride: 104 mEq/L (ref 96–112)
Creatinine, Ser: 1.09 mg/dL (ref 0.40–1.20)
GFR: 49.76 mL/min — ABNORMAL LOW (ref >60.00)
GLUCOSE: 224 mg/dL — AB (ref 70–99)
POTASSIUM: 4.3 meq/L (ref 3.5–5.1)
Sodium: 136 mEq/L (ref 135–145)

## 2018-02-02 MED FILL — SULFAMETHOXAZOLE-TMP DS TAB: 800-160 | 7 days supply | Qty: 14 | Fill #0

## 2018-02-02 MED FILL — TRUE METRIX TEST STRIP: 30 days supply | Qty: 100 | Fill #0

## 2018-02-02 MED FILL — !TRUE METRIX BLOOD GLUCOSE: 1 days supply | Qty: 1 | Fill #0

## 2018-02-02 MED FILL — FLUTICASONE PROP 50 MCG SPR: 50 | 30 days supply | Qty: 16 | Fill #0

## 2018-02-03 LAB — URINE CULTURE

## 2018-02-05 ENCOUNTER — Telehealth: Payer: Self-pay

## 2018-02-05 ENCOUNTER — Other Ambulatory Visit: Payer: Self-pay | Admitting: Pharmacist

## 2018-02-05 MED ORDER — TRUEPLUS LANCETS 28G MISC: 11 refills | Status: DC

## 2018-02-05 MED FILL — TRUEplus LANCETS 28G MISC: 25 days supply | Qty: 100 | Fill #0

## 2018-02-05 NOTE — Telephone Encounter (Signed)
Contacted pt son to go over lab results pt son stated they already received results and hung up

## 2018-02-13 MED FILL — DORZOLAMIDE-TIMOLOL EYE DRP: 22.3-6.8 | 38 days supply | Qty: 10 | Fill #3

## 2018-02-13 MED FILL — AMLODIPINE BESYLATE 5 MG TA: 5 | 30 days supply | Qty: 30 | Fill #2

## 2018-02-13 MED FILL — LOSARTAN POTASSIUM 25 MG TA: 25 | 30 days supply | Qty: 30 | Fill #2

## 2018-02-13 MED FILL — ATORVASTATIN 20 MG TABLET: 20 | 30 days supply | Qty: 30 | Fill #2

## 2018-02-13 MED FILL — LEVOTHYROXINE 100 MCG TAB: 100 | 30 days supply | Qty: 30 | Fill #2

## 2018-02-13 MED FILL — metFORMIN HCL 850 MG TABS: 850 | 30 days supply | Qty: 60 | Fill #2

## 2018-02-13 MED FILL — TRAVATAN Z 0.004% EYE DROP: 0.004 | 18 days supply | Qty: 3 | Fill #3

## 2018-02-13 MED FILL — BRIMONIDINE 0.2% EYE DROP: 0.2 | 36 days supply | Qty: 10 | Fill #3

## 2018-02-13 MED FILL — GLIMEPIRIDE 4 MG TABS: 4 | 30 days supply | Qty: 30 | Fill #2

## 2018-02-14 ENCOUNTER — Ambulatory Visit
Admission: RE | Admit: 2018-02-14 | Discharge: 2018-02-14 | Disposition: A | Discharge status: Home / Self Care | Payer: Self-pay | Source: Ambulatory Visit | Attending: Family Medicine | Admitting: Family Medicine

## 2018-02-14 DIAGNOSIS — M545 Low back pain, unspecified: Secondary | ICD-10-CM

## 2018-02-14 DIAGNOSIS — M549 Dorsalgia, unspecified: Secondary | ICD-10-CM

## 2018-02-14 DIAGNOSIS — E209 Hypoparathyroidism, unspecified: Secondary | ICD-10-CM

## 2018-02-15 ENCOUNTER — Telehealth: Payer: Self-pay | Admitting: Internal Medicine

## 2018-02-15 DIAGNOSIS — E209 Hypoparathyroidism, unspecified: Secondary | ICD-10-CM

## 2018-02-15 NOTE — Telephone Encounter (Signed)
Per patient's request please call patient at ph# [phone] to give patient lab results. Patient also requests instruction on how much Calcium she is supposed to be taking.

## 2018-02-15 NOTE — Telephone Encounter (Signed)
Spoke the son Mr. Eula Listen    We discussed low calcium of 7.4 mg/dL Pt is asymptomatic.    Unfortunately when she was on 3 tablets a day  Of Calcium carbonate and 0.75 mcg of Calcitriol her calcium went from 7.8 mg/dL to 40.7 mg/dL   So I'd rather keep her on the current dose for now as below , we consider reducing calcium some more while we increase calcitriol if needed in the future.     Continue Calcium carbonate 500 mg TID  Continue Calcitriol 0.5 mcg daily   Repeat labs in 2 weeks    Son expressed understanding.   Konrad Dolores Shamleffer

## 2018-02-15 NOTE — Telephone Encounter (Signed)
Please advise 

## 2018-02-19 ENCOUNTER — Telehealth: Payer: Self-pay | Admitting: *Deleted

## 2018-02-19 NOTE — Telephone Encounter (Signed)
Medical Assistant used [employer] to contact patient.  Interpreter Name: Ahmed Interpreter #: [phone] Patient verified DOB Patient is aware of osteopenia being noted and to take vitamin d and calcium to help support the weakened bones. Patient reports that she is taking both supplements daily. No further questions.

## 2018-02-19 NOTE — Telephone Encounter (Signed)
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 Make sure that Vit D and calcium intake is adequate

## 2018-03-02 ENCOUNTER — Telehealth: Payer: Self-pay | Admitting: Internal Medicine

## 2018-03-02 ENCOUNTER — Other Ambulatory Visit (INDEPENDENT_AMBULATORY_CARE_PROVIDER_SITE_OTHER): Payer: Self-pay

## 2018-03-02 DIAGNOSIS — E209 Hypoparathyroidism, unspecified: Secondary | ICD-10-CM

## 2018-03-02 LAB — CALCIUM: Calcium: 9.3 mg/dL (ref 8.4–10.5)

## 2018-03-02 LAB — ALBUMIN: Albumin: 4 g/dL (ref 3.5–5.2)

## 2018-03-02 NOTE — Telephone Encounter (Signed)
  Left a message on the voice mail, to the son  Mr. Eula Listen that calcium is normal.   NO changes at this time.      Continue Calcium carbonate 500 mg TID  Continue Calcitriol 0.5 mcg daily   Recheck calcium in 6 weeks.         Abby Raelyn Mora, MD  [facility]  [facility] [address] [place], [place] [address] Phone: [phone] FAX: [phone]

## 2018-03-08 ENCOUNTER — Ambulatory Visit (HOSPITAL_COMMUNITY)
Admission: EM | Admit: 2018-03-08 | Discharge: 2018-03-08 | Disposition: A | Discharge status: Home / Self Care | Payer: Self-pay | Attending: Family Medicine | Admitting: Family Medicine

## 2018-03-08 ENCOUNTER — Encounter (HOSPITAL_COMMUNITY): Payer: Self-pay

## 2018-03-08 DIAGNOSIS — R300 Dysuria: Secondary | ICD-10-CM | POA: Insufficient documentation

## 2018-03-08 DIAGNOSIS — N390 Urinary tract infection, site not specified: Secondary | ICD-10-CM | POA: Insufficient documentation

## 2018-03-08 LAB — POCT URINALYSIS DIP (DEVICE)
Bilirubin Urine: NEGATIVE
Glucose, UA: NEGATIVE mg/dL
Ketones, ur: NEGATIVE mg/dL
Nitrite: NEGATIVE
Protein, ur: NEGATIVE mg/dL
Specific Gravity, Urine: <=1.005 (ref 1.005–1.030)
Urobilinogen, UA: 0.2 mg/dL (ref 0.0–1.0)
pH: 5.5 (ref 5.0–8.0)

## 2018-03-08 MED ORDER — CEPHALEXIN 500 MG PO CAPS: 500.0000 mg | ORAL_CAPSULE | Freq: Four times a day (QID) | ORAL | 0 refills | Status: DC

## 2018-03-08 NOTE — ED Provider Notes (Signed)
[place]    CSN: [id number] Arrival date & time: 03/08/18  1613     History   Chief Complaint Chief Complaint  Patient presents with  . Urinary Tract Infection    HPI Taquana Bartley is a 68 y.o. female.   The history is provided by the patient. No language interpreter was used.  Urinary Tract Infection  Pain quality:  Aching Pain severity:  Moderate Onset quality:  Gradual Timing:  Constant Progression:  Worsening Chronicity:  Recurrent Recent urinary tract infections: yes   Relieved by:  Nothing Worsened by:  Nothing Ineffective treatments:  None tried Urinary symptoms: frequent urination   Associated symptoms: no fever   Pt reports 4 infections in the past year.    Past Medical History:  Diagnosis Date  . Diabetes mellitus without complication ([place])   . Glaucoma   . Hypertension     Patient Active Problem List   Diagnosis Date Noted  . Hypoparathyroidism ([place]) 12/03/2017  . Osteoarthritis of right hip 05/16/2016  . Diabetes mellitus without complication ([place]) 92/33/0076    Past Surgical History:  Procedure Laterality Date  . GLAUCOMA SURGERY    . THYROIDECTOMY      OB History   No obstetric history on file.      Home Medications    Prior to Admission medications   Medication Sig Start Date End Date Taking? Authorizing Provider  amLODipine (NORVASC) 5 MG tablet Take 1 tablet (5 mg total) by mouth daily. 12/14/17   Fulp, Cammie, MD  atorvastatin (LIPITOR) 20 MG tablet Take 1 tablet (20 mg total) by mouth daily. 12/14/17   Fulp, Cammie, MD  Blood Glucose Monitoring Suppl (TRUE METRIX METER) w/Device KIT 1 each by Does not apply route 2 (two) times daily. 02/01/18   Argentina Donovan, PA-C  brimonidine (ALPHAGAN) 0.2 % ophthalmic solution Place 1 drop into both eyes 3 (three) times daily.    [provider]  calcitRIOL (ROCALTROL) 0.25 MCG capsule Take 2 capsules (0.5 mcg total) by mouth daily. 01/12/18   Shamleffer, Melanie Crazier, MD  calcium-vitamin D (OSCAL WITH D) 500-200 MG-UNIT tablet Take 1 tablet by mouth 3 (three) times daily. 01/12/18   Shamleffer, Melanie Crazier, MD  cephALEXin (KEFLEX) 500 MG capsule Take 1 capsule (500 mg total) by mouth 4 (four) times daily. 03/08/18   Fransico Meadow, PA-C  COBALAMINE COMBINATIONS PO Take 500 mcg by mouth. Cobal (Mecobalamin)    [provider]  Cyanocobalamin (VITAMIN B 12 PO) Take 1 tablet by mouth daily.    [provider]  dorzolamide-timolol (COSOPT) 22.3-6.8 MG/ML ophthalmic solution Place 1 drop into both eyes 2 (two) times daily. 04/18/16   Alfonse Spruce, FNP  fluticasone (FLONASE) 50 MCG/ACT nasal spray Place 2 sprays into both nostrils daily. 02/01/18   Argentina Donovan, PA-C  glimepiride (AMARYL) 4 MG tablet Take 1 tablet (4 mg total) by mouth daily with breakfast. 12/14/17   Fulp, Cammie, MD  glucose blood test strip Use as instructed 02/01/18   Argentina Donovan, PA-C  levothyroxine (SYNTHROID, LEVOTHROID) 100 MCG tablet TAKE 1 TABLET BY MOUTH DAILY BEFORE BREAKFAST. 12/14/17   Fulp, Cammie, MD  losartan (COZAAR) 25 MG tablet Take 1 tablet (25 mg total) by mouth daily. 12/14/17   Fulp, Cammie, MD  metFORMIN (GLUCOPHAGE) 850 MG tablet Take 1 tablet (850 mg total) by mouth 2 (two) times daily with a meal. 12/14/17   Fulp, Cammie, MD  nystatin ointment (MYCOSTATIN) Apply 1  application topically 2 (two) times daily. 09/14/17   Fulp, Cammie, MD  pantoprazole (PROTONIX) 40 MG tablet Take 1 tablet (40 mg total) by mouth daily. Patient not taking: Reported on 01/12/2018 09/14/17   Fulp, Ander Gaster, MD  sulfamethoxazole-trimethoprim (BACTRIM DS,SEPTRA DS) 800-160 MG tablet Take 1 tablet by mouth 2 (two) times daily. 02/01/18   Argentina Donovan, PA-C  TRUEPLUS LANCETS 28G MISC Use to check blood sugar up to 3 times daily. 02/05/18   Fulp, Cammie, MD  vitamin A 10000 UNIT capsule Take 10,000 Units by mouth daily.    [provider]  vitamin B-12  (CYANOCOBALAMIN) 100 MCG tablet Take 100 mcg by mouth daily.    [provider]    Family History Family History  Problem Relation Age of Onset  . Diabetes Mother     Social History Social History   Tobacco Use  . Smoking status: Never Smoker  . Smokeless tobacco: Never Used  Substance Use Topics  . Alcohol use: No  . Drug use: No     Allergies   Clindamycin/lincomycin and Penicillins   Review of Systems Review of Systems  Constitutional: Negative for fever.  All other systems reviewed and are negative.    Physical Exam Triage Vital Signs ED Triage Vitals  Enc Vitals Group     BP 03/08/18 1711 134/68     Pulse Rate 03/08/18 1711 91     Resp 03/08/18 1711 18     Temp 03/08/18 1711 98.5 F (36.9 C)     Temp Source 03/08/18 1711 Oral     SpO2 03/08/18 1711 100 %     Weight --      Height --      Head Circumference --      Peak Flow --      Pain Score 03/08/18 1712 6     Pain Loc --      Pain Edu? --      Excl. in [place]? --    No data found.  Updated Vital Signs BP 134/68 (BP Location: Right Arm)   Pulse 91   Temp 98.5 F (36.9 C) (Oral)   Resp 18   SpO2 100%   Visual Acuity Right Eye Distance:   Left Eye Distance:   Bilateral Distance:    Right Eye Near:   Left Eye Near:    Bilateral Near:     Physical Exam Vitals signs and nursing note reviewed.  Constitutional:      Appearance: She is well-developed.  HENT:     Head: Normocephalic.     Right Ear: Tympanic membrane normal.     Mouth/Throat:     Mouth: Mucous membranes are moist.  Eyes:     Pupils: Pupils are equal, round, and reactive to light.  Neck:     Musculoskeletal: Normal range of motion.  Cardiovascular:     Rate and Rhythm: Normal rate.  Pulmonary:     Effort: Pulmonary effort is normal.  Abdominal:     General: Abdomen is flat. There is no distension.  Musculoskeletal: Normal range of motion.  Neurological:     General: No focal deficit present.     Mental  Status: She is alert and oriented to person, place, and time.      UC Treatments / Results  Labs (all labs ordered are listed, but only abnormal results are displayed) Labs Reviewed  POCT URINALYSIS DIP (DEVICE) - Abnormal; Notable for the following components:      Result  Value   Hgb urine dipstick TRACE (*)    Leukocytes,Ua SMALL (*)    All other components within normal limits  URINE CULTURE    EKG None  Radiology No results found.  Procedures Procedures (including critical care time)  Medications Ordered in UC Medications - No data to display  Initial Impression / Assessment and Plan / UC Course  I have reviewed the triage vital signs and the nursing notes.  Pertinent labs & imaging results that were available during my care of the patient were reviewed by me and considered in my medical decision making (see chart for details).    MDM  ua shows small leukocytes and trace of hgb.   I will treat with antibiotics.  Pt advised to schedule to see urology for evaluation  Rx for keflex  Final Clinical Impressions(s) / UC Diagnoses   Final diagnoses:  Dysuria  Urinary tract infection without hematuria, site unspecified     Discharge Instructions     Return if any problems.     ED Prescriptions    Medication Sig Dispense Auth. Provider   cephALEXin (KEFLEX) 500 MG capsule Take 1 capsule (500 mg total) by mouth 4 (four) times daily. 28 capsule Fransico Meadow, [place]     Controlled Substance Prescriptions [place] Controlled Substance Registry consulted? Not Applicable   Fransico Meadow, [place] 03/08/18 9741

## 2018-03-08 NOTE — Discharge Instructions (Addendum)
Return if any problems.

## 2018-03-08 NOTE — ED Triage Notes (Signed)
Pt presents with urinary tract symptoms; burning with urination, urinary frequency, flank pain, and headache

## 2018-03-10 LAB — URINE CULTURE

## 2018-03-12 MED FILL — metFORMIN HCL 850 MG TABS: 850 | 30 days supply | Qty: 60 | Fill #3

## 2018-03-12 MED FILL — CALCITRIOL 0.25 MCG CAPS: 0.25 | 30 days supply | Qty: 60 | Fill #2

## 2018-03-12 MED FILL — LEVOTHYROXINE 100 MCG TAB: 100 | 30 days supply | Qty: 30 | Fill #3

## 2018-03-12 MED FILL — AMLODIPINE BESYLATE 5 MG TA: 5 | 30 days supply | Qty: 30 | Fill #3

## 2018-03-12 MED FILL — ATORVASTATIN 20 MG TABLET: 20 | 30 days supply | Qty: 30 | Fill #3

## 2018-03-12 MED FILL — LOSARTAN POTASSIUM 25 MG TA: 25 | 30 days supply | Qty: 30 | Fill #3

## 2018-03-12 MED FILL — TRUE METRIX TEST STRIP: 30 days supply | Qty: 100 | Fill #1

## 2018-03-12 MED FILL — GLIMEPIRIDE 4 MG TABS: 4 | 30 days supply | Qty: 30 | Fill #3

## 2018-03-13 MED FILL — TRAVATAN Z 0.004% EYE DROP: 0.004 | 18 days supply | Qty: 3 | Fill #4

## 2018-03-19 MED FILL — BRIMONIDINE 0.2% EYE DROP: 0.2 | 36 days supply | Qty: 10 | Fill #4

## 2018-03-19 MED FILL — DORZOLAMIDE-TIMOLOL EYE DRP: 22.3-6.8 | 38 days supply | Qty: 10 | Fill #4

## 2018-04-18 MED FILL — ATORVASTATIN 20 MG TABLET: 20 | 30 days supply | Qty: 30 | Fill #4

## 2018-04-18 MED FILL — LOSARTAN POTASSIUM 25 MG TA: 25 | 30 days supply | Qty: 30 | Fill #4

## 2018-04-18 MED FILL — metFORMIN HCL 850 MG TABS: 850 | 30 days supply | Qty: 60 | Fill #4

## 2018-04-18 MED FILL — GLIMEPIRIDE 4 MG TABS: 4 | 30 days supply | Qty: 30 | Fill #4

## 2018-04-18 MED FILL — BRIMONIDINE 0.2% EYE DROP: 0.2 | 36 days supply | Qty: 10 | Fill #5

## 2018-04-18 MED FILL — TRUE METRIX TEST STRIP: 30 days supply | Qty: 100 | Fill #2

## 2018-04-18 MED FILL — AMLODIPINE BESYLATE 5 MG TA: 5 | 30 days supply | Qty: 30 | Fill #4

## 2018-04-18 MED FILL — CALCITRIOL 0.25 MCG CAPS: 0.25 | 30 days supply | Qty: 60 | Fill #3

## 2018-04-18 MED FILL — DORZOLAMIDE-TIMOLOL EYE DRP: 22.3-6.8 | 40 days supply | Qty: 10 | Fill #1

## 2018-04-18 MED FILL — TRAVATAN Z 0.004% EYE DROP: 0.004 | 21 days supply | Qty: 3 | Fill #1

## 2018-04-18 MED FILL — LEVOTHYROXINE 100 MCG TAB: 100 | 30 days supply | Qty: 30 | Fill #4

## 2018-04-18 MED FILL — NYSTATIN 100,000 UNITS/GM O: 100000 | 15 days supply | Qty: 30 | Fill #1

## 2018-04-23 DIAGNOSIS — H26493 Other secondary cataract, bilateral: Secondary | ICD-10-CM

## 2018-04-23 DIAGNOSIS — H401413 Capsular glaucoma with pseudoexfoliation of lens, right eye, severe stage: Secondary | ICD-10-CM | POA: Insufficient documentation

## 2018-04-23 DIAGNOSIS — Z961 Presence of intraocular lens: Secondary | ICD-10-CM | POA: Insufficient documentation

## 2018-04-23 DIAGNOSIS — H401422 Capsular glaucoma with pseudoexfoliation of lens, left eye, moderate stage: Secondary | ICD-10-CM | POA: Insufficient documentation

## 2018-04-23 HISTORY — DX: Other secondary cataract, bilateral: H26.493

## 2018-05-03 MED FILL — MOXIFLOXACIN 0.5% EYE DROPS: 0.5 | 15 days supply | Qty: 3 | Fill #0

## 2018-05-16 ENCOUNTER — Ambulatory Visit: Payer: Self-pay | Admitting: Internal Medicine

## 2018-05-16 MED FILL — PREDNISOLONE AC 1% EYE DROP: 1 | 7 days supply | Qty: 5 | Fill #0

## 2018-05-17 ENCOUNTER — Ambulatory Visit: Payer: Self-pay | Attending: Family Medicine | Admitting: Physician Assistant

## 2018-05-17 ENCOUNTER — Other Ambulatory Visit: Payer: Self-pay

## 2018-05-17 DIAGNOSIS — E119 Type 2 diabetes mellitus without complications: Secondary | ICD-10-CM

## 2018-05-17 DIAGNOSIS — R300 Dysuria: Secondary | ICD-10-CM

## 2018-05-17 DIAGNOSIS — Z789 Other specified health status: Secondary | ICD-10-CM

## 2018-05-17 LAB — POCT URINALYSIS DIP (CLINITEK)
Bilirubin, UA: NEGATIVE
Glucose, UA: NEGATIVE mg/dL
Ketones, POC UA: NEGATIVE mg/dL
Nitrite, UA: NEGATIVE
POC PROTEIN,UA: NEGATIVE
Spec Grav, UA: <=1.005 — AB (ref 1.010–1.025)
Urobilinogen, UA: 0.2 E.U./dL
pH, UA: 7 (ref 5.0–8.0)

## 2018-05-17 MED ORDER — SULFAMETHOXAZOLE-TRIMETHOPRIM 800-160 MG PO TABS: 1.0000 | ORAL_TABLET | Freq: Two times a day (BID) | ORAL | 0 refills | Status: DC

## 2018-05-17 MED ORDER — FLUCONAZOLE 150 MG PO TABS: 150.0000 mg | ORAL_TABLET | Freq: Once | ORAL | 0 refills | Status: AC

## 2018-05-17 MED FILL — SULFAMETHOXAZOLE-TMP DS TAB: 800-160 | 10 days supply | Qty: 20 | Fill #0

## 2018-05-17 MED FILL — FLUCONAZOLE 150 MG TABS: 150 | 1 days supply | Qty: 1 | Fill #0

## 2018-05-17 NOTE — Progress Notes (Signed)
Virtual Visit via Telephone Note  I connected with Norma Clay on 05/17/18 at 11:10 AM EDT by telephone and verified that I am speaking with the correct person using two identifiers.   I discussed the limitations, risks, security and privacy concerns of performing an evaluation and management service by telephone and the availability of in person appointments. I also discussed with the patient that there may be a patient responsible charge related to this service. The patient expressed understanding and agreed to proceed.  Patient location:  home My Location:  [facility] office Persons on the call:  Myself, the patient, and the interpreter(Ahmad)  History of Present Illness:  On and off urinary pain for 1 year. This episode has been occurring for 1 month or more. No fever.  No abdominal pain.  No gross hematuria. No fever.  No vaginal symptoms.  No n/v/d.   Blood sugars running <200   Observations/Objective:  A&Ox3.  Speech is clear.     Assessment and Plan: 1. Dysuria Longstanding-will treat based on results.  Patient dropped off urine specimen and it showed moderate Leukocytes-septra ds 1 bid X 10 days and diflucan if needed.  Drink more water.  Urine hygiene discussed. (Urine cultures since 12/2017 reviewed and have shown no predominant bacterial species.) - POCT URINALYSIS DIP (CLINITEK) - Urine Culture - Ambulatory referral to Urology - Urine cytology ancillary only  2. Diabetes mellitus without complication (HCC) Not at goal-continue medication regimen and work on diet/decrease white carbs  3. Language barrier [place] interpreters used and additional time performing visit was required.   Follow Up Instructions:    I discussed the assessment and treatment plan with the patient. The patient was provided an opportunity to ask questions and all were answered. The patient agreed with the plan and demonstrated an understanding of the instructions.   The patient was advised to call  back or seek an in-person evaluation if the symptoms worsen or if the condition fails to improve as anticipated.  I provided 12 minutes of non-face-to-face time during this encounter.   Georgian Co, PA-C  Patient ID: Norma Clay, female   DOB: 1950-02-22, 68 y.o.   MRN: [id number]

## 2018-05-17 NOTE — Progress Notes (Signed)
Called patient to initiate their telephone visit with provider Georgian Co, PA-C. Verified date of birth. Has c/o UTI symptoms off & on x 1 year. Has c/o burning w/urination, flank pain, urinary frequency, scanty urination & lower back pain. Denies nausea, vomiting, fever, chills. KWalker, CMA.

## 2018-05-18 LAB — URINE CYTOLOGY ANCILLARY ONLY
Chlamydia: NEGATIVE
Neisseria Gonorrhea: NEGATIVE
Trichomonas: NEGATIVE

## 2018-05-19 LAB — URINE CULTURE

## 2018-05-21 MED FILL — CALCITRIOL 0.25 MCG CAPS: 0.25 | 30 days supply | Qty: 60 | Fill #4

## 2018-05-21 MED FILL — LOSARTAN POTASSIUM 25 MG TA: 25 | 30 days supply | Qty: 30 | Fill #5

## 2018-05-21 MED FILL — TRAVATAN Z 0.004% EYE DROP: 0.004 | 21 days supply | Qty: 3 | Fill #2

## 2018-05-21 MED FILL — ATORVASTATIN 20 MG TABLET: 20 | 30 days supply | Qty: 30 | Fill #5

## 2018-05-21 MED FILL — GLIMEPIRIDE 4 MG TABS: 4 | 30 days supply | Qty: 30 | Fill #5

## 2018-05-21 MED FILL — TRUE METRIX TEST STRIP: 30 days supply | Qty: 100 | Fill #3

## 2018-05-21 MED FILL — DORZOLAMIDE-TIMOLOL EYE DRP: 22.3-6.8 | 40 days supply | Qty: 10 | Fill #2

## 2018-05-21 MED FILL — AMLODIPINE BESYLATE 5 MG TA: 5 | 30 days supply | Qty: 30 | Fill #5

## 2018-05-21 MED FILL — LEVOTHYROXINE 100 MCG TAB: 100 | 30 days supply | Qty: 30 | Fill #5

## 2018-05-21 MED FILL — NYSTATIN 100,000 UNITS/GM O: 100000 | 15 days supply | Qty: 30 | Fill #2

## 2018-05-21 MED FILL — metFORMIN HCL 850 MG TABS: 850 | 30 days supply | Qty: 60 | Fill #5

## 2018-05-22 ENCOUNTER — Other Ambulatory Visit: Payer: Self-pay | Admitting: Physician Assistant

## 2018-05-22 LAB — URINE CYTOLOGY ANCILLARY ONLY
Bacterial vaginitis: POSITIVE — AB
Candida vaginitis: NEGATIVE

## 2018-05-22 MED ORDER — METRONIDAZOLE 500 MG PO TABS: 500.0000 mg | ORAL_TABLET | Freq: Two times a day (BID) | ORAL | 0 refills | Status: DC

## 2018-05-22 MED ORDER — FLUCONAZOLE 150 MG PO TABS: 150.0000 mg | ORAL_TABLET | Freq: Once | ORAL | 0 refills | Status: AC

## 2018-05-22 MED FILL — metroNIDAZOLE 500 MG TABS: 500 | 7 days supply | Qty: 14 | Fill #0

## 2018-05-22 MED FILL — FLUCONAZOLE 150 MG TABS: 150 | 1 days supply | Qty: 1 | Fill #0

## 2018-05-23 ENCOUNTER — Telehealth: Payer: Self-pay | Admitting: Emergency Medicine

## 2018-05-23 NOTE — Telephone Encounter (Addendum)
Attempted to call patient with results and additional medication.  Unable to get a [place] interpreter.  Nurse decided to call back later.  Called son to inform him and found out that pharmacy had called and the son had the information and was coming to the pharmacy to pick up the medications.

## 2018-05-30 ENCOUNTER — Ambulatory Visit (INDEPENDENT_AMBULATORY_CARE_PROVIDER_SITE_OTHER): Payer: Self-pay | Admitting: Internal Medicine

## 2018-05-30 ENCOUNTER — Encounter: Payer: Self-pay | Admitting: Internal Medicine

## 2018-05-30 ENCOUNTER — Ambulatory Visit: Payer: Self-pay | Admitting: Internal Medicine

## 2018-05-30 VITALS — BP 112/68 | HR 76 | Temp 98.2°F | Ht 66.0 in | Wt 162.4 lb

## 2018-05-30 DIAGNOSIS — E209 Hypoparathyroidism, unspecified: Secondary | ICD-10-CM

## 2018-05-30 LAB — BASIC METABOLIC PANEL
BUN: 27 mg/dL — ABNORMAL HIGH (ref 6–23)
CO2: 25 mEq/L (ref 19–32)
Calcium: 8.1 mg/dL — ABNORMAL LOW (ref 8.4–10.5)
Chloride: 101 mEq/L (ref 96–112)
Creatinine, Ser: 1.18 mg/dL (ref 0.40–1.20)
GFR: 45.36 mL/min — ABNORMAL LOW (ref >60.00)
Glucose, Bld: 138 mg/dL — ABNORMAL HIGH (ref 70–99)
Potassium: 4.3 mEq/L (ref 3.5–5.1)
Sodium: 135 mEq/L (ref 135–145)

## 2018-05-30 LAB — ALBUMIN: Albumin: 3.9 g/dL (ref 3.5–5.2)

## 2018-05-30 NOTE — Patient Instructions (Addendum)
-   Continue Calcium Carbonate 500 mg Three tablets a day  - Continue Calcitriol two capsules a day  - Continue Over the counter Vitamin D 400 iu daily       24-Hour Urine Collection   You will be collecting your urine for a 24-hour period of time.  Your timer starts with your first urine of the morning (For example - If you first pee at 9AM, your timer will start at 9AM)  Throw away your first urine of the morning  Collect your urine every time you pee for the next 24 hours STOP your urine collection 24 hours after you started the collection (For example - You would stop at 9AM the day after you started)

## 2018-05-30 NOTE — Progress Notes (Signed)
Name: Norma Clay  MRN/ DOB: [id number], 06-May-1950    Age/ Sex: 68 y.o., female     PCP: Antony Blackbird, MD   Reason for Endocrinology Evaluation: Post-surgical hypoparathyroidism     Initial Endocrinology Clinic Visit: 12/08/17    PATIENT IDENTIFIER: Norma Clay is a 68 y.o., female with a past medical history of Total thyroidectomy and T2DM. She has followed with [facility] since 12/08/2017 for consultative assistance with management of her Post-surgical hypoparathyroidism.   HISTORICAL SUMMARY:  She  moved from [place] in 2019. 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 She denies history of thyroid cancer. Pt noted hand spasms following 2nd surgery and has been on Calcium and Calcitriol since.   SUBJECTIVE:     Today (05/31/2018):  Norma Clay is here with her son Deforest Hoyles. She is here for a follow up on hypocalcemia. Since her last visit , she has another serum calcium at 9.3 mg/dL, the patient is  Compliant with calcium carbonate at 3 tablets daily and calcitriol to 0.5 mcg daily. She had stopped Vitamin D3  as she is scared of it affecting her renal function Today she denies any shortness of breath, she denies any numbness or perioral tingling, she denies any carpopedal spasms.   ROS:  As per HPI.   HISTORY:  Past Medical History:  Past Medical History:  Diagnosis Date  . Diabetes mellitus without complication ([place])   . Glaucoma   . Hypertension    Past Surgical History:  Past Surgical History:  Procedure Laterality Date  . GLAUCOMA SURGERY    . THYROIDECTOMY      Social History:  reports that she has never smoked. She has never used smokeless tobacco. She reports that she does not drink alcohol or use drugs.  Family History: family history includes Diabetes in her mother.   HOME MEDICATIONS: Allergies as of 05/30/2018      Reactions   Clindamycin/lincomycin  Rash   Penicillins Rash   Patient was placed on a form of penicillin by her dentist and developed a rash therefore medication was changed to clindamycin.  Per son, patient is not allergic to clindamycin      Medication List       Accurate as of May 30, 2018 11:59 PM. If you have any questions, ask your nurse or doctor.        amLODipine 5 MG tablet Commonly known as:  NORVASC Take 1 tablet (5 mg total) by mouth daily.   atorvastatin 20 MG tablet Commonly known as:  LIPITOR Take 1 tablet (20 mg total) by mouth daily.   brimonidine 0.2 % ophthalmic solution Commonly known as:  ALPHAGAN Place 1 drop into both eyes 3 (three) times daily.   calcitRIOL 0.25 MCG capsule Commonly known as:  ROCALTROL Take 2 capsules (0.5 mcg total) by mouth daily.   COBALAMINE COMBINATIONS PO Take 500 mcg by mouth. Cobal (Mecobalamin)   dorzolamide-timolol 22.3-6.8 MG/ML ophthalmic solution Commonly known as:  COSOPT Place 1 drop into both eyes 2 (two) times daily.   fluticasone 50 MCG/ACT nasal spray Commonly known as:  FLONASE Place 2 sprays into both nostrils daily.   glimepiride 4 MG tablet Commonly known as:  AMARYL Take 1 tablet (4 mg total) by mouth daily with breakfast.   glucose blood test strip Use as instructed   levothyroxine 100 MCG tablet Commonly known as:  SYNTHROID TAKE  1 TABLET BY MOUTH DAILY BEFORE BREAKFAST.   losartan 25 MG tablet Commonly known as:  COZAAR Take 1 tablet (25 mg total) by mouth daily.   metFORMIN 850 MG tablet Commonly known as:  GLUCOPHAGE Take 1 tablet (850 mg total) by mouth 2 (two) times daily with a meal.   metroNIDAZOLE 500 MG tablet Commonly known as:  FLAGYL Take 1 tablet (500 mg total) by mouth 2 (two) times daily.   neomycin-polymyxin-dexameth 0.1 % Oint Commonly known as:  MAXITROL Do not use tonight!   Apply a strip of ointment the size of a grain of rice to RIGHT eye at bedtime. This must be the last eye medicine of the day.    Dr. Ander Slade will evaluate your post-operative status tomorrow and may make changes to the duration and dose of this medication.   Netarsudil Dimesylate 0.02 % Soln Apply 1 drop to eye every evening.   Oscal 500/200 D-3 500-200 MG-UNIT tablet Generic drug:  calcium-vitamin D Take 1 tablet by mouth daily.   pantoprazole 40 MG tablet Commonly known as:  PROTONIX Take 1 tablet (40 mg total) by mouth daily.   sulfamethoxazole-trimethoprim 800-160 MG tablet Commonly known as:  BACTRIM DS Take 1 tablet by mouth 2 (two) times daily.   Travatan Z 0.004 % Soln ophthalmic solution Generic drug:  Travoprost (BAK Free) Apply 1 drop to eye at bedtime.   True Metrix Meter w/Device Kit 1 each by Does not apply route 2 (two) times daily.   TRUEplus Lancets 28G Misc Use to check blood sugar up to 3 times daily.   vitamin A 10000 UNIT capsule Take 10,000 Units by mouth daily.         OBJECTIVE:   PHYSICAL EXAM: VS: BP 112/68 (BP Location: Left Arm, Patient Position: Sitting, Cuff Size: Normal)   Pulse 76   Temp 98.2 F (36.8 C)   Ht _0  (1.676 m)   Wt 162 lb 6.4 oz (73.7 kg)   SpO2 97%   BMI 26.21 kg/m    EXAM: General: Pt appears well and is in NAD  Lungs: Clear with good BS bilat with no rales, rhonchi, or wheezes  Heart: Auscultation: RRR.  Abdomen: Normoactive bowel sounds, soft, nontender, without masses or organomegaly palpable  Extremities:  BL LE: No pretibial edema normal ROM and strength.  Skin: Hair: Texture and amount normal with gender appropriate distribution Skin Inspection: No rashes. Skin Palpation: Skin temperature, texture, and thickness normal to palpation  Neuro: Cranial nerves: II - XII grossly intact  Motor: Normal strength throughout  Mental Status: Judgment, insight: Intact Orientation: Oriented to time, place, and person Mood and affect: No depression, anxiety, or agitation     DATA REVIEWED:  Results for KEONTA, MONCEAUX (MRN [id number]) as  of 05/31/2018 08:00  Ref. Range 05/30/2018 10:38  Sodium Latest Ref Range: 135 - 145 mEq/L 135  Potassium Latest Ref Range: 3.5 - 5.1 mEq/L 4.3  Chloride Latest Ref Range: 96 - 112 mEq/L 101  CO2 Latest Ref Range: 19 - 32 mEq/L 25  Glucose Latest Ref Range: 70 - 99 mg/dL 138 (H)  BUN Latest Ref Range: 6 - 23 mg/dL 27 (H)  Creatinine Latest Ref Range: 0.40 - 1.20 mg/dL 1.18  Calcium Latest Ref Range: 8.4 - 10.5 mg/dL 8.1 (L)  Albumin Latest Ref Range: 3.5 - 5.2 g/dL 3.9  GFR Latest Ref Range: >60.00 mL/min 45.36 (L)      ASSESSMENT / PLAN / RECOMMENDATIONS:   1. Surgical  Hypoparathyroidism :   - The goals of therapy in patients with hypoparathyroidism are to relieve symptoms, to raise and maintain the serum calcium concentration in the low-normal range( 8.0 to 9 mg/dL), and to prevent iatrogenic development of kidney stones. Attainment of higher serum calcium values is not necessary and is usually limited by the development of hypercalciuria due to the loss of renal calcium-retaining effects of parathyroid hormone .  - Calcitriol is the vitamin D metabolite of choice because it does not require renal activation, it has a rapid onset of action (hours), and a shorter half-life.  - The major side effects of calcium and vitamin D replacement in patients with hypoparathyroidism are hypercalcemia and hypercalciuria, which, if chronic, can cause nephrolithiasis, nephrocalcinosis, and renal failure. Hypercalciuria is the earliest sign of toxicity and can develop in the absence of hypercalcemia.   - To prevent these complications, urinary calcium excretion should be measured periodically and the dose of calcium and vitamin D reduced if it is elevated (?300 mg in 24 hours).  - Corrected serum calcium on today's labs is 8.18 mg/dL. Renal function is stable. The fact that there's a variability in her serum calcium despite being on the same dose of calcium and calcitriol include the same calcium  brand , is variability in absorption issues and compliance.   - Will proceed with 24- hr urine collection   Medications:   Continue Calcium Carbonate 500 mg TID with meals Continue Calcitriol 0.76mg daily  (0.25 mcg x2) Continue Vitamin D3 400 iu daily      F/u in 6 months    Addendum: Discussed results with son HDeforest Hoyleson 5/28 @ 11:30 am     Signed electronically by: AMack Guise MD  [facility]  [address], [place]  [address]Phone: [phone]: [phone]     CC: FAntony Blackbird MD [address]Phone: [phone] Fax: [phone]  Return to Endocrinology clinic as below: Future Appointments  Date Time Provider DH[place] 06/28/2018 11:10 AM FAntony Blackbird MD [facility] None  12/03/2018  9:30 AM , IMelanie Crazier MD [id number] None

## 2018-06-04 ENCOUNTER — Other Ambulatory Visit: Payer: Self-pay

## 2018-06-04 DIAGNOSIS — E209 Hypoparathyroidism, unspecified: Secondary | ICD-10-CM

## 2018-06-04 MED FILL — PREDNISOLONE AC 1% EYE DROP: 1 | 7 days supply | Qty: 5 | Fill #1

## 2018-06-05 LAB — CALCIUM, URINE, 24 HOUR: Calcium, 24H Urine: 166 mg/24 h

## 2018-06-05 LAB — CREATININE, URINE, 24 HOUR: Creatinine, 24H Ur: 0.54 g/(24.h) (ref 0.50–2.15)

## 2018-06-13 MED FILL — PREDNISOLONE AC 1% EYE DROP: 1 | 25 days supply | Qty: 5 | Fill #0

## 2018-06-20 ENCOUNTER — Ambulatory Visit: Payer: Self-pay

## 2018-06-20 MED FILL — ?AMLODIPINE BESYLATE 5MG TA: 5 | 30 days supply | Qty: 30 | Fill #6

## 2018-06-20 MED FILL — $pred forte 1% eye drops: 1 | 25 days supply | Qty: 10 | Fill #2

## 2018-06-20 MED FILL — GLIMEPIRIDE 4 MG TABS: 4 | 30 days supply | Qty: 30 | Fill #6

## 2018-06-20 MED FILL — DORZOLAMIDE-TIMOLOL EYE DRP: 22.3-6.8 | 40 days supply | Qty: 10 | Fill #3

## 2018-06-20 MED FILL — ?METFORMIN HCL 850 MG TABLE: 850 | 30 days supply | Qty: 60 | Fill #6

## 2018-06-20 MED FILL — TRAVATAN Z 0.004% EYE DROP: 0.004 | 21 days supply | Qty: 3 | Fill #3

## 2018-06-20 MED FILL — CALCITRIOL 0.25 MCG CAPS: 0.25 | 30 days supply | Qty: 60 | Fill #5

## 2018-06-20 MED FILL — TRUE METRIX TEST STRIP: 30 days supply | Qty: 100 | Fill #4

## 2018-06-20 MED FILL — LOSARTAN POTASSIUM 25 MG TA: 25 | 30 days supply | Qty: 30 | Fill #6

## 2018-06-20 MED FILL — ?ATORVASTATIN 20 MG TABLET: 20 | 30 days supply | Qty: 30 | Fill #6

## 2018-06-21 MED FILL — LEVOTHYROXINE 100 MCG TAB: 100 | 30 days supply | Qty: 30 | Fill #6

## 2018-06-28 ENCOUNTER — Other Ambulatory Visit: Payer: Self-pay

## 2018-06-28 ENCOUNTER — Encounter: Payer: Self-pay | Admitting: Family Medicine

## 2018-06-28 ENCOUNTER — Ambulatory Visit: Payer: Self-pay | Attending: Family Medicine | Admitting: Family Medicine

## 2018-06-28 VITALS — BP 119/80 | HR 86 | Temp 98.2°F | Ht 66.0 in | Wt 164.2 lb

## 2018-06-28 DIAGNOSIS — E119 Type 2 diabetes mellitus without complications: Secondary | ICD-10-CM

## 2018-06-28 DIAGNOSIS — N390 Urinary tract infection, site not specified: Secondary | ICD-10-CM

## 2018-06-28 DIAGNOSIS — M549 Dorsalgia, unspecified: Secondary | ICD-10-CM

## 2018-06-28 DIAGNOSIS — R350 Frequency of micturition: Secondary | ICD-10-CM

## 2018-06-28 DIAGNOSIS — R319 Hematuria, unspecified: Secondary | ICD-10-CM

## 2018-06-28 DIAGNOSIS — M79672 Pain in left foot: Secondary | ICD-10-CM

## 2018-06-28 DIAGNOSIS — H9201 Otalgia, right ear: Secondary | ICD-10-CM

## 2018-06-28 LAB — POCT URINALYSIS DIP (CLINITEK)
Bilirubin, UA: NEGATIVE
Glucose, UA: NEGATIVE mg/dL
Ketones, POC UA: NEGATIVE mg/dL
Nitrite, UA: NEGATIVE
POC PROTEIN,UA: NEGATIVE
Spec Grav, UA: <=1.005 — AB
Urobilinogen, UA: 0.2 U/dL
pH, UA: 6.5

## 2018-06-28 LAB — POCT GLYCOSYLATED HEMOGLOBIN (HGB A1C): Hemoglobin A1C: 7.1 % — AB (ref 4.0–5.6)

## 2018-06-28 MED ORDER — NEOMYCIN-POLYMYXIN-HC 3.5-10000-1 OT SOLN: 4.0000 [drp] | Freq: Four times a day (QID) | OTIC | 0 refills | Status: AC

## 2018-06-28 MED FILL — NEO/POLYMYXIN/HC EAR SOLN: 3.5-10000-1 | 7 days supply | Qty: 10 | Fill #0

## 2018-06-28 NOTE — Progress Notes (Signed)
Established Patient Office Visit  Subjective:  Patient ID: Norma Clay, female    DOB: 03-Dec-1950  Age: 68 y.o. MRN: [id number]   [employer] video interpretation system used to today's visit due to a language barrier and patient's son also helped with clarification  CC:  Chief Complaint  Patient presents with  . Diabetes    HPI Norma Clay presents for follow-up of chronic medical issues including type 2 diabetes and she has a complaint of pain in the toes of her left foot as well as pain in her right mid back.  She also reports increased frequency of urination despite her blood sugar control being improved.  Her son reports that home blood sugars are generally 130 220 or less fasting.  She has had no hypoglycemic episodes.  She is following a healthy diet.       Patient son states that patient has had recurrent urinary tract infections and has had recent increased frequency of urination.  She does not feel as if she is having any burning or discomfort at the end of urination.  She does have urinary urgency.  Patient is also complaining at today's visit of dull, aching pain at the right mid back that is about a 6 on a 0-to-10 scale.  She denies any nausea or abdominal pain.  She also complains that a few years ago she fell and felt that her toes were bent underneath her foot when she fell and since that time she has had pain in the toes of her left foot except for the great toe.  Pain is dull and aching and is worse when she is standing/wearing shoes but not as much pain when she is not putting any weight on her feet.        She also has complaint of some pain at the right ear.  She denies any change in or loss of hearing.  No ear discharge.  Ear can hurt with or without touching the area.  She does have some increased discomfort when she touches the outside of the ear or places pressure in this area.  She has had this pain off and on for about 2 weeks.  She does have some chronic issues with nasal  allergies, nasal congestion.  Past Medical History:  Diagnosis Date  . Diabetes mellitus without complication ([place])   . Glaucoma   . Hypertension     Past Surgical History:  Procedure Laterality Date  . GLAUCOMA SURGERY    . THYROIDECTOMY      Family History  Problem Relation Age of Onset  . Diabetes Mother     Social History   Tobacco Use  . Smoking status: Never Smoker  . Smokeless tobacco: Never Used  Substance Use Topics  . Alcohol use: No  . Drug use: No    Outpatient Medications Prior to Visit  Medication Sig Dispense Refill  . amLODipine (NORVASC) 5 MG tablet Take 1 tablet (5 mg total) by mouth daily. 30 tablet 6  . atorvastatin (LIPITOR) 20 MG tablet Take 1 tablet (20 mg total) by mouth daily. 30 tablet 6  . Blood Glucose Monitoring Suppl (TRUE METRIX METER) w/Device KIT 1 each by Does not apply route 2 (two) times daily. 1 kit 0  . calcitRIOL (ROCALTROL) 0.25 MCG capsule Take 2 capsules (0.5 mcg total) by mouth daily. 90 capsule 6  . calcium-vitamin D (OSCAL 500/200 D-3) 500-200 MG-UNIT tablet Take 1 tablet by mouth daily.    . COBALAMINE COMBINATIONS  PO Take 500 mcg by mouth. Cobal (Mecobalamin)    . dorzolamide-timolol (COSOPT) 22.3-6.8 MG/ML ophthalmic solution Place 1 drop into both eyes 2 (two) times daily. 10 mL 0  . glimepiride (AMARYL) 4 MG tablet Take 1 tablet (4 mg total) by mouth daily with breakfast. 30 tablet 6  . glucose blood test strip Use as instructed 100 each 12  . levothyroxine (SYNTHROID, LEVOTHROID) 100 MCG tablet TAKE 1 TABLET BY MOUTH DAILY BEFORE BREAKFAST. 60 tablet 3  . losartan (COZAAR) 25 MG tablet Take 1 tablet (25 mg total) by mouth daily. 60 tablet 3  . metFORMIN (GLUCOPHAGE) 850 MG tablet Take 1 tablet (850 mg total) by mouth 2 (two) times daily with a meal. 60 tablet 6  . Netarsudil Dimesylate 0.02 % SOLN Apply 1 drop to eye every evening.    . prednisoLONE acetate (PRED FORTE) 1 % ophthalmic suspension Apply to eye.    .  predniSONE 5 MG/5ML solution Take by mouth daily with breakfast.    . sulfamethoxazole-trimethoprim (BACTRIM DS) 800-160 MG tablet Take 1 tablet by mouth 2 (two) times daily. 20 tablet 0  . Travoprost, BAK Free, (TRAVATAN Z) 0.004 % SOLN ophthalmic solution Apply 1 drop to eye at bedtime.    . TRUEPLUS LANCETS 28G MISC Use to check blood sugar up to 3 times daily. 100 each 11  . vitamin A 10000 UNIT capsule Take 10,000 Units by mouth daily.    . metroNIDAZOLE (FLAGYL) 500 MG tablet Take 1 tablet (500 mg total) by mouth 2 (two) times daily. (Patient not taking: Reported on 06/28/2018) 14 tablet 0  . pantoprazole (PROTONIX) 40 MG tablet Take 1 tablet (40 mg total) by mouth daily. (Patient not taking: Reported on 06/28/2018) 30 tablet 3  . brimonidine (ALPHAGAN) 0.2 % ophthalmic solution Place 1 drop into both eyes 3 (three) times daily.    . fluticasone (FLONASE) 50 MCG/ACT nasal spray Place 2 sprays into both nostrils daily. (Patient not taking: Reported on 06/28/2018) 16 g 6   No facility-administered medications prior to visit.     Allergies  Allergen Reactions  . Clindamycin/Lincomycin Rash  . Penicillins Rash    Patient was placed on a form of penicillin by her dentist and developed a rash therefore medication was changed to clindamycin.  Per son, patient is not allergic to clindamycin    ROS Review of Systems  Constitutional: Negative for chills, fatigue and fever.  HENT: Positive for ear pain (right). Negative for sore throat and trouble swallowing.   Eyes: Positive for visual disturbance (Wears glasses, has had recent cataract procedure). Negative for photophobia.  Respiratory: Negative for cough and shortness of breath.   Cardiovascular: Negative for chest pain, palpitations and leg swelling.  Gastrointestinal: Positive for abdominal pain (discomfort across lower abdomen/beneath the belly button). Negative for constipation, diarrhea and nausea.  Endocrine: Positive for polyuria.  Negative for polydipsia and polyphagia.  Genitourinary: Positive for flank pain, frequency and urgency. Negative for dysuria.  Musculoskeletal: Positive for arthralgias (third, foutrth and fifth toes on the left foot). Negative for back pain and gait problem.  Neurological: Negative for dizziness and headaches.  Hematological: Negative for adenopathy. Does not bruise/bleed easily.      Objective:    Physical Exam  Constitutional: She is oriented to person, place, and time. She appears well-developed and well-nourished.  Well-nourished, well-developed older female in no acute distress.  Patient is wearing glasses.  Patient is accompanied by her son.  HENT:  Right Ear: External  ear normal.  Left Ear: External ear normal.  Mouth/Throat: Oropharynx is clear and moist.  Mild nasal congestion with mild clear discharge  Eyes: Conjunctivae and EOM are normal.  Neck: Neck supple. No thyromegaly present.  No carotid bruit appreciated on exam  Cardiovascular: Normal rate and regular rhythm.  Pulmonary/Chest: Effort normal and breath sounds normal.  Abdominal: Soft. There is abdominal tenderness (Mild suprapubic discomfort to palpation). There is no rebound and no guarding.  Musculoskeletal:        General: Tenderness (Mild tenderness with movement of the toes on the left foot) present. No edema.     Comments: Right CVA tenderness  Lymphadenopathy:    She has no cervical adenopathy.  Neurological: She is alert and oriented to person, place, and time.  Skin: Skin is warm and dry.  No active skin breakdown on the feet; patient with vertical hyperpigmented line on the right great toe  Psychiatric: She has a normal mood and affect. Her behavior is normal.  Nursing note and vitals reviewed. DM foot exam: Sensory exam of the foot is normal, tested with the monofilament.  Patient with reduced peripheral pulses (has had prior vascular surgery referral).  Mild hammertoe deformities and patient with some  discomfort with movement of the toes of the left foot.  BP 119/80 (BP Location: Right Arm, Patient Position: Sitting, Cuff Size: Large)   Pulse 86   Temp 98.2 F (36.8 C) (Oral)   Ht '5\' 6"'  (1.676 m)   Wt 164 lb 3.2 oz (74.5 kg)   SpO2 97%   BMI 26.50 kg/m  Wt Readings from Last 3 Encounters:  06/28/18 164 lb 3.2 oz (74.5 kg)  05/30/18 162 lb 6.4 oz (73.7 kg)  02/01/18 166 lb 12.8 oz (75.7 kg)     Health Maintenance Due  Topic Date Due  . OPHTHALMOLOGY EXAM  01/04/1959  . MAMMOGRAM  01/04/1999  . COLONOSCOPY  01/04/1999  . FOOT EXAM  04/18/2017  . PNA vac Low Risk Adult (2 of 2 - PPSV23) 04/18/2017  . HEMOGLOBIN A1C  06/15/2018     Lab Results  Component Value Date   TSH 1.98 12/08/2017   Lab Results  Component Value Date   WBC 6.7 10/30/2014   HGB 13.1 10/30/2014   HCT 38.5 10/30/2014   MCV 93.4 10/30/2014   PLT 249 10/30/2014   Lab Results  Component Value Date   NA 135 05/30/2018   K 4.3 05/30/2018   CO2 25 05/30/2018   GLUCOSE 138 (H) 05/30/2018   BUN 27 (H) 05/30/2018   CREATININE 1.18 05/30/2018   BILITOT 0.5 09/14/2017   ALKPHOS 91 09/14/2017   AST 18 09/14/2017   ALT 6 09/14/2017   PROT 7.9 09/14/2017   ALBUMIN 3.9 05/30/2018   CALCIUM 8.1 (L) 05/30/2018   ANIONGAP 14 10/30/2014   GFR 45.36 (L) 05/30/2018   Lab Results  Component Value Date   CHOL 251 (H) 09/14/2017   Lab Results  Component Value Date   HDL 70 09/14/2017   Lab Results  Component Value Date   LDLCALC 164 (H) 09/14/2017   Lab Results  Component Value Date   TRIG 85 09/14/2017   Lab Results  Component Value Date   CHOLHDL 3.6 09/14/2017   Lab Results  Component Value Date   HGBA1C 8.6 (A) 12/14/2017      Assessment & Plan:  1. Diabetes mellitus without complication [facility]) Patient with improvement in her diabetes control as her son reports that her blood  sugars are generally in the 130s to 120s or less fasting without any hypoglycemic episodes.  She will  continue her current medications.  Hemoglobin A1c will be done at today's visit as well as urinalysis. - HgB A1c - POCT URINALYSIS DIP (CLINITEK)  2. Urinary frequency; 3. Acute mid back pain; 4. Recurrent UTI Patient son reports that patient has had recurrent urinary tract infections and patient at today's visit has some acute mid back pain/right CVA tenderness as well as complaint of urinary frequency.  Will obtain urinalysis to look for possible signs of urinary tract infection.  Urine will also be sent for culture and she will be notified if any further treatment is needed based on the findings.  She is also being referred to urology in follow-up of possible recurrent urinary tract infections as well as hematuria. - Ambulatory referral to Urology - POCT URINALYSIS DIP (CLINITEK) - Urine Culture  5. Foot pain, left Discussed with patient and son that patient's toe pain is likely secondary to arthritis status post prior injury to the toes as patient reports a fall with toe injury within the past 4 to 5 years and has had recurrent pain since that time.  She also has a hyperpigmented area on the right great toenail which also needs evaluation to make sure that this does not represent melanoma.  She will be referred to podiatry for further evaluation and treatment - Ambulatory referral to Podiatry  6. Hematuria, unspecified type Per patient son, patient has had recurrent urinary tract infections but this is not necessarily supported by prior labs and urine cultures.  Patient of course has been out of the country at times and may have had urinary tract infections during that time.  Patient however has had hematuria and due to her recurrent symptoms, she will be referred to urology for further evaluation and treatment.  Her urine will be sent for culture to see if she does have the presence of infection at today's visit and patient/son will be notified if further treatment is needed based on the culture  results. - Ambulatory referral to Urology - Urine Culture  7. Ear pain, right Patient's TM appears normal but she has some discomfort with palpation of the tragus of the right ear.  No visible drainage or debris within the canal.  Will have patient try Cortisporin otic drops.  Also suggested over-the-counter antihistamine such as loratadine to help with nasal congestion which may also be causing some ear discomfort. - neomycin-polymyxin-hydrocortisone (CORTISPORIN) OTIC solution; Place 4 drops into the right ear 4 (four) times daily for 7 days. For 7 days  Dispense: 10 mL; Refill: 0  An After Visit Summary was printed and given to the patient.  Follow-up: Return in about 4 months (around 10/28/2018), or if symptoms worsen or fail to improve, for UTI- 1 or 2 weeks if not better; DM-4 months.   Antony Blackbird, MD

## 2018-06-28 NOTE — Patient Instructions (Signed)
Urinary Tract Infection, Adult A urinary tract infection (UTI) is an infection of any part of the urinary tract. The urinary tract includes:  The kidneys.  The ureters.  The bladder.  The urethra. These organs make, store, and get rid of pee (urine) in the body. What are the causes? This is caused by germs (bacteria) in your genital area. These germs grow and cause swelling (inflammation) of your urinary tract. What increases the risk? You are more likely to develop this condition if:  You have a small, thin tube (catheter) to drain pee.  You cannot control when you pee or poop (incontinence).  You are female, and: ? You use these methods to prevent pregnancy: ? A medicine that kills sperm (spermicide). ? A device that blocks sperm (diaphragm). ? You have low levels of a female hormone (estrogen). ? You are pregnant.  You have genes that add to your risk.  You are sexually active.  You take antibiotic medicines.  You have trouble peeing because of: ? A prostate that is bigger than normal, if you are female. ? A blockage in the part of your body that drains pee from the bladder (urethra). ? A kidney stone. ? A nerve condition that affects your bladder (neurogenic bladder). ? Not getting enough to drink. ? Not peeing often enough.  You have other conditions, such as: ? Diabetes. ? A weak disease-fighting system (immune system). ? Sickle cell disease. ? Gout. ? Injury of the spine. What are the signs or symptoms? Symptoms of this condition include:  Needing to pee right away (urgently).  Peeing often.  Peeing small amounts often.  Pain or burning when peeing.  Blood in the pee.  Pee that smells bad or not like normal.  Trouble peeing.  Pee that is cloudy.  Fluid coming from the vagina, if you are female.  Pain in the belly or lower back. Other symptoms include:  Throwing up (vomiting).  No urge to eat.  Feeling mixed up (confused).  Being tired  and grouchy (irritable).  A fever.  Watery poop (diarrhea). How is this treated? This condition may be treated with:  Antibiotic medicine.  Other medicines.  Drinking enough water. Follow these instructions at home:  Medicines  Take over-the-counter and prescription medicines only as told by your doctor.  If you were prescribed an antibiotic medicine, take it as told by your doctor. Do not stop taking it even if you start to feel better. General instructions  Make sure you: ? Pee until your bladder is empty. ? Do not hold pee for a long time. ? Empty your bladder after sex. ? Wipe from front to back after pooping if you are a female. Use each tissue one time when you wipe.  Drink enough fluid to keep your pee pale yellow.  Keep all follow-up visits as told by your doctor. This is important. Contact a doctor if:  You do not get better after 1-2 days.  Your symptoms go away and then come back. Get help right away if:  You have very bad back pain.  You have very bad pain in your lower belly.  You have a fever.  You are sick to your stomach (nauseous).  You are throwing up. Summary  A urinary tract infection (UTI) is an infection of any part of the urinary tract.  This condition is caused by germs in your genital area.  There are many risk factors for a UTI. These include having a small, thin   tube to drain pee and not being able to control when you pee or poop.  Treatment includes antibiotic medicines for germs.  Drink enough fluid to keep your pee pale yellow. This information is not intended to replace advice given to you by your health care provider. Make sure you discuss any questions you have with your health care provider. Document Released: 06/08/2007 Document Revised: 06/29/2017 Document Reviewed: 06/29/2017 Elsevier Interactive Patient Education  2019 [employer]  

## 2018-06-28 NOTE — Progress Notes (Signed)
DM

## 2018-06-30 LAB — URINE CULTURE

## 2018-07-05 ENCOUNTER — Other Ambulatory Visit: Payer: Self-pay

## 2018-07-05 ENCOUNTER — Ambulatory Visit: Payer: Self-pay | Attending: Family Medicine

## 2018-07-11 ENCOUNTER — Ambulatory Visit (INDEPENDENT_AMBULATORY_CARE_PROVIDER_SITE_OTHER): Payer: Self-pay | Admitting: Urology

## 2018-07-11 ENCOUNTER — Telehealth: Payer: Self-pay | Admitting: Family Medicine

## 2018-07-11 ENCOUNTER — Other Ambulatory Visit (HOSPITAL_COMMUNITY)
Admission: RE | Admit: 2018-07-11 | Discharge: 2018-07-11 | Disposition: A | Discharge status: Home / Self Care | Payer: Self-pay | Source: Ambulatory Visit | Attending: Urology | Admitting: Urology

## 2018-07-11 DIAGNOSIS — R310 Gross hematuria: Secondary | ICD-10-CM

## 2018-07-11 DIAGNOSIS — R300 Dysuria: Secondary | ICD-10-CM | POA: Insufficient documentation

## 2018-07-17 ENCOUNTER — Other Ambulatory Visit: Payer: Self-pay | Admitting: Urology

## 2018-07-17 ENCOUNTER — Other Ambulatory Visit (HOSPITAL_COMMUNITY): Payer: Self-pay | Admitting: Urology

## 2018-07-17 DIAGNOSIS — R310 Gross hematuria: Secondary | ICD-10-CM

## 2018-07-19 ENCOUNTER — Other Ambulatory Visit: Payer: Self-pay | Admitting: Family Medicine

## 2018-07-19 DIAGNOSIS — I10 Essential (primary) hypertension: Secondary | ICD-10-CM

## 2018-07-19 DIAGNOSIS — E785 Hyperlipidemia, unspecified: Secondary | ICD-10-CM

## 2018-07-19 DIAGNOSIS — E1169 Type 2 diabetes mellitus with other specified complication: Secondary | ICD-10-CM

## 2018-07-19 DIAGNOSIS — E119 Type 2 diabetes mellitus without complications: Secondary | ICD-10-CM

## 2018-07-19 MED FILL — PREDNISOLONE AC 1% EYE DROP: 1 | 12 days supply | Qty: 5 | Fill #3

## 2018-07-19 MED FILL — CALCITRIOL 0.25 MCG CAPS: 0.25 | 30 days supply | Qty: 60 | Fill #6

## 2018-07-19 MED FILL — ?METFORMIN HCL 850 MG TABLE: 850 | 30 days supply | Qty: 60 | Fill #0

## 2018-07-19 MED FILL — ?ATORVASTATIN 20 MG TABLET: 20 | 30 days supply | Qty: 30 | Fill #0

## 2018-07-19 MED FILL — GLIMEPIRIDE 4 MG TABS: 4 | 30 days supply | Qty: 30 | Fill #0

## 2018-07-19 MED FILL — LEVOTHYROXINE 100 MCG TAB: 100 | 30 days supply | Qty: 30 | Fill #7

## 2018-07-19 MED FILL — ?AMLODIPINE BESYLATE 5MG TA: 5 | 30 days supply | Qty: 30 | Fill #0

## 2018-07-19 MED FILL — LOSARTAN POTASSIUM 25 MG TA: 25 | 30 days supply | Qty: 30 | Fill #7

## 2018-07-23 ENCOUNTER — Other Ambulatory Visit: Payer: Self-pay

## 2018-07-23 ENCOUNTER — Ambulatory Visit (HOSPITAL_COMMUNITY)
Admission: RE | Admit: 2018-07-23 | Discharge: 2018-07-23 | Disposition: A | Discharge status: Home / Self Care | Payer: Self-pay | Source: Ambulatory Visit | Attending: Urology | Admitting: Urology

## 2018-07-23 DIAGNOSIS — R310 Gross hematuria: Secondary | ICD-10-CM | POA: Insufficient documentation

## 2018-07-23 MED ORDER — IOHEXOL 300 MG/ML  SOLN
125.0000 mL | Freq: Once | INTRAMUSCULAR | Status: AC | PRN
  Administered 2018-07-23: 125 mL via INTRAVENOUS

## 2018-07-23 MED FILL — TRAVATAN Z 0.004% EYE DROP: 0.004 | 18 days supply | Qty: 3 | Fill #0

## 2018-07-25 MED FILL — DORZOLAMIDE HCL-TIMOLOL MAL: 22.3-6.8 | 40 days supply | Qty: 10 | Fill #4

## 2018-07-25 MED FILL — TRUE METRIX TEST STRIP: 30 days supply | Qty: 100 | Fill #5

## 2018-07-30 ENCOUNTER — Other Ambulatory Visit: Payer: Self-pay | Admitting: Podiatry

## 2018-07-30 ENCOUNTER — Ambulatory Visit (INDEPENDENT_AMBULATORY_CARE_PROVIDER_SITE_OTHER): Payer: No Typology Code available for payment source | Admitting: Podiatry

## 2018-07-30 ENCOUNTER — Other Ambulatory Visit: Payer: Self-pay

## 2018-07-30 ENCOUNTER — Ambulatory Visit: Payer: No Typology Code available for payment source

## 2018-07-30 VITALS — Temp 97.2°F

## 2018-07-30 DIAGNOSIS — M7742 Metatarsalgia, left foot: Secondary | ICD-10-CM

## 2018-07-30 DIAGNOSIS — M2042 Other hammer toe(s) (acquired), left foot: Secondary | ICD-10-CM

## 2018-07-30 DIAGNOSIS — M2041 Other hammer toe(s) (acquired), right foot: Secondary | ICD-10-CM

## 2018-07-30 DIAGNOSIS — M79672 Pain in left foot: Secondary | ICD-10-CM

## 2018-07-30 DIAGNOSIS — M7741 Metatarsalgia, right foot: Secondary | ICD-10-CM

## 2018-07-30 MED ORDER — DICLOFENAC SODIUM 1 % TD GEL: 2.0000 g | Freq: Four times a day (QID) | TRANSDERMAL | 2 refills | Status: DC

## 2018-07-30 MED FILL — DICLOFENAC SODIUM 1% GEL: 1 | 6 days supply | Qty: 100 | Fill #0

## 2018-07-30 NOTE — Progress Notes (Signed)
Subjective:   Patient ID: Norma Clay, female   DOB: 68 y.o.   MRN: [id number]   HPI 68 year old female presents the office today with a translator as well as a family member for concerns of pain to the tips of her toes on her left foot on digits 2 through 5.  She states it hurts blood pressure.  She states that she had an injury but 6 years ago when she fell she never had any treatment she is not sure if that is the cause.  She said no recent treatment.  She also describes some pain to the ball of the foot as well she points to.  No sharp, radiating pain.  No proximal pain.  No pain the top of her foot onto the ball.  She has no other concerns today.  She is diabetic and her last A1c was 7.1.  Review of Systems  All other systems reviewed and are negative.  Past Medical History:  Diagnosis Date  . Diabetes mellitus without complication ([place])   . Glaucoma   . Hypertension     Past Surgical History:  Procedure Laterality Date  . GLAUCOMA SURGERY    . THYROIDECTOMY       Current Outpatient Medications:  .  amLODipine (NORVASC) 5 MG tablet, Take 1 tablet (5 mg total) by mouth daily., Disp: 30 tablet, Rfl: 2 .  atorvastatin (LIPITOR) 20 MG tablet, TAKE 1 TABLET (20 MG TOTAL) BY MOUTH DAILY., Disp: 30 tablet, Rfl: 2 .  atropine 1 % ophthalmic solution, Place one drop into the right eye 2 (two) times daily for 14 days. Do not take tonight.  Dr. Ander Slade will evaluate your post-operative status tomorrow and may make changes to the duration and dose of this medication., Disp: , Rfl:  .  Blood Glucose Monitoring Suppl (TRUE METRIX METER) w/Device KIT, 1 each by Does not apply route 2 (two) times daily., Disp: 1 kit, Rfl: 0 .  calcitRIOL (ROCALTROL) 0.25 MCG capsule, Take 2 capsules (0.5 mcg total) by mouth daily., Disp: 90 capsule, Rfl: 6 .  calcium-vitamin D (OSCAL 500/200 D-3) 500-200 MG-UNIT tablet, Take 1 tablet by mouth daily., Disp: , Rfl:  .  COBALAMINE COMBINATIONS PO, Take 500 mcg by  mouth. Cobal (Mecobalamin), Disp: , Rfl:  .  dorzolamide-timolol (COSOPT) 22.3-6.8 MG/ML ophthalmic solution, Place 1 drop into both eyes 2 (two) times daily., Disp: 10 mL, Rfl: 0 .  glimepiride (AMARYL) 4 MG tablet, TAKE 1 TABLET (4 MG TOTAL) BY MOUTH DAILY WITH BREAKFAST., Disp: 30 tablet, Rfl: 2 .  glucose blood test strip, Use as instructed, Disp: 100 each, Rfl: 12 .  levothyroxine (SYNTHROID, LEVOTHROID) 100 MCG tablet, TAKE 1 TABLET BY MOUTH DAILY BEFORE BREAKFAST., Disp: 60 tablet, Rfl: 3 .  losartan (COZAAR) 25 MG tablet, Take 1 tablet (25 mg total) by mouth daily., Disp: 60 tablet, Rfl: 3 .  metFORMIN (GLUCOPHAGE) 850 MG tablet, TAKE 1 TABLET (850 MG TOTAL) BY MOUTH 2 (TWO) TIMES DAILY WITH A MEAL., Disp: 60 tablet, Rfl: 2 .  metroNIDAZOLE (FLAGYL) 500 MG tablet, Take 1 tablet (500 mg total) by mouth 2 (two) times daily., Disp: 14 tablet, Rfl: 0 .  moxifloxacin (VIGAMOX) 0.5 % ophthalmic solution, Place one drop into the right eye 3 (three) times a day for 21 days. Do not take tonight.  Dr. Ander Slade will evaluate your post-operative status tomorrow and may make changes to the duration and dose of this medication., Disp: , Rfl:  .  neomycin-polymyxin-dexameth (MAXITROL)  0.1 % OINT, Do not use tonight!   Apply a strip of ointment the size of a grain of rice to RIGHT eye at bedtime. This must be the last eye medicine of the day.   Dr. Ander Slade will evaluate your post-operative status tomorrow and may make changes to the duration and dose of this medication., Disp: , Rfl:  .  Netarsudil Dimesylate 0.02 % SOLN, Apply 1 drop to eye every evening., Disp: , Rfl:  .  pantoprazole (PROTONIX) 40 MG tablet, Take 1 tablet (40 mg total) by mouth daily., Disp: 30 tablet, Rfl: 3 .  prednisoLONE acetate (PRED FORTE) 1 % ophthalmic suspension, Apply to eye., Disp: , Rfl:  .  predniSONE 5 MG/5ML solution, Take by mouth daily with breakfast., Disp: , Rfl:  .  sulfamethoxazole-trimethoprim (BACTRIM DS) 800-160 MG tablet,  Take 1 tablet by mouth 2 (two) times daily., Disp: 20 tablet, Rfl: 0 .  Travoprost, BAK Free, (TRAVATAN Z) 0.004 % SOLN ophthalmic solution, Apply 1 drop to eye at bedtime., Disp: , Rfl:  .  TRUEPLUS LANCETS 28G MISC, Use to check blood sugar up to 3 times daily., Disp: 100 each, Rfl: 11 .  vitamin A 10000 UNIT capsule, Take 10,000 Units by mouth daily., Disp: , Rfl:  .  diclofenac sodium (VOLTAREN) 1 % GEL, Apply 2 g topically 4 (four) times daily. Rub into affected area of foot 2 to 4 times daily, Disp: 100 g, Rfl: 2  Allergies  Allergen Reactions  . Clindamycin/Lincomycin Rash  . Penicillins Rash    Patient was placed on a form of penicillin by her dentist and developed a rash therefore medication was changed to clindamycin.  Per son, patient is not allergic to clindamycin          Objective:  Physical Exam  General: AAO x3, NAD  Dermatological: Skin is warm, dry and supple bilateral. Nails x 10 are well manicured; remaining integument appears unremarkable at this time. There are no open sores, no preulcerative lesions, no rash or signs of infection present.  Vascular: Dorsalis Pedis artery and Posterior Tibial artery pedal pulses are 2/4 bilateral with immedate capillary fill time. Pedal hair growth present. No varicosities and no lower extremity edema present bilateral. There is no pain with calf compression, swelling, warmth, erythema.   Neruologic: Grossly intact via light touch bilateral. Vibratory intact via tuning fork bilateral. Protective threshold with Semmes Wienstein monofilament intact to all pedal sites bilateral.   Musculoskeletal: Hammertoe contractures are present and are semirigid on the left foot on 3, 4 there is more rigid on the second.  There is really small hyperkeratotic tissues although mild today the tip of the toe most of the second digit.  Mild tenderness submetatarsal area 2 through 4.  No specific area of tenderness.  There is no palpable neuroma  identified.  No other areas of pinpoint tenderness.  Muscular strength 5/5 in all groups tested bilateral.  Gait: Unassisted, Nonantalgic.       Assessment:   Hammertoe deformity, metatarsalgia left side     Plan:  -Treatment options discussed including all alternatives, risks, and complications -Etiology of symptoms were discussed -X-rays were obtained and reviewed with the patient. No evidence of acute fracture or stress fracture identified today.  Calcaneal bone spurring is present. -Dispensed toe crest for the hammertoes.  Also dispensed metatarsal pads.  Prescribed Voltaren gel.  Discussed shoe modifications  Return if symptoms worsen or fail to improve.  Trula Slade DPM

## 2018-08-01 ENCOUNTER — Ambulatory Visit (INDEPENDENT_AMBULATORY_CARE_PROVIDER_SITE_OTHER): Payer: Self-pay | Admitting: Urology

## 2018-08-01 DIAGNOSIS — R310 Gross hematuria: Secondary | ICD-10-CM

## 2018-08-13 MED FILL — TRAVATAN Z 0.004% EYE DROP: 0.004 | 18 days supply | Qty: 3 | Fill #0

## 2018-08-15 ENCOUNTER — Other Ambulatory Visit: Payer: Self-pay | Admitting: Family Medicine

## 2018-08-15 DIAGNOSIS — I10 Essential (primary) hypertension: Secondary | ICD-10-CM

## 2018-08-15 DIAGNOSIS — E039 Hypothyroidism, unspecified: Secondary | ICD-10-CM

## 2018-08-15 MED FILL — LEVOTHYROXINE 100 MCG TAB: 100 | 30 days supply | Qty: 30 | Fill #0

## 2018-08-15 MED FILL — LOSARTAN POTASSIUM 25 MG TA: 25 | 30 days supply | Qty: 30 | Fill #0

## 2018-08-15 MED FILL — GLIMEPIRIDE 4 MG TABS: 4 | 30 days supply | Qty: 30 | Fill #1

## 2018-08-15 MED FILL — ?METFORMIN HCL 850 MG TABLE: 850 | 30 days supply | Qty: 60 | Fill #1

## 2018-08-15 MED FILL — TRUE METRIX TEST STRIP: 30 days supply | Qty: 100 | Fill #6

## 2018-08-15 MED FILL — ?AMLODIPINE BESYLATE 5MG TA: 5 | 30 days supply | Qty: 30 | Fill #1

## 2018-08-15 MED FILL — DICLOFENAC SODIUM 1% GEL: 1 | 6 days supply | Qty: 100 | Fill #1

## 2018-08-15 MED FILL — ?ATORVASTATIN 20 MG TABLET: 20 | 30 days supply | Qty: 30 | Fill #1

## 2018-08-16 MED FILL — PREDNISOLONE AC 1% EYE DROP: 1 | 18 days supply | Qty: 5 | Fill #1

## 2018-08-20 MED FILL — PREDNISOLONE AC 1% EYE DROP: 1 | 28 days supply | Qty: 5 | Fill #0

## 2018-08-22 MED FILL — DORZOLAMIDE-TIMOLOL EYE DRP: 22.3-6.8 | 75 days supply | Qty: 10 | Fill #0

## 2018-09-12 MED FILL — LOSARTAN POTASSIUM 25 MG TA: 25 | 30 days supply | Qty: 30 | Fill #1

## 2018-09-12 MED FILL — ?AMLODIPINE BESYLATE 5MG TA: 5 | 30 days supply | Qty: 30 | Fill #2

## 2018-09-12 MED FILL — DICLOFENAC SODIUM 1% GEL: 1 | 6 days supply | Qty: 100 | Fill #2

## 2018-09-12 MED FILL — ?ATORVASTATIN 20 MG TABLET: 20 | 30 days supply | Qty: 30 | Fill #2

## 2018-09-12 MED FILL — TRAVATAN Z 0.004% EYE DROP: 0.004 | 18 days supply | Qty: 3 | Fill #1

## 2018-09-12 MED FILL — GLIMEPIRIDE 4 MG TABS: 4 | 30 days supply | Qty: 30 | Fill #2

## 2018-09-12 MED FILL — LEVOTHYROXINE 100 MCG TAB: 100 | 30 days supply | Qty: 30 | Fill #1

## 2018-09-12 MED FILL — ?METFORMIN HCL 850 MG TABLE: 850 | 30 days supply | Qty: 60 | Fill #2

## 2018-09-14 MED FILL — DORZOLAMIDE-TIMOLOL EYE DRP: 22.3-6.8 | 40 days supply | Qty: 10 | Fill #0

## 2018-10-01 DIAGNOSIS — Z9889 Other specified postprocedural states: Secondary | ICD-10-CM | POA: Insufficient documentation

## 2018-10-15 ENCOUNTER — Other Ambulatory Visit: Payer: Self-pay | Admitting: Family Medicine

## 2018-10-15 DIAGNOSIS — E785 Hyperlipidemia, unspecified: Secondary | ICD-10-CM

## 2018-10-15 DIAGNOSIS — E119 Type 2 diabetes mellitus without complications: Secondary | ICD-10-CM

## 2018-10-15 DIAGNOSIS — E1169 Type 2 diabetes mellitus with other specified complication: Secondary | ICD-10-CM

## 2018-10-15 MED FILL — TRAVATAN Z 0.004% EYE DROP: 0.004 | 18 days supply | Qty: 3 | Fill #2

## 2018-10-15 MED FILL — LOSARTAN POTASSIUM 25 MG TA: 25 | 30 days supply | Qty: 30 | Fill #2

## 2018-10-15 MED FILL — TRUE METRIX TEST STRIP: 30 days supply | Qty: 100 | Fill #7

## 2018-10-19 ENCOUNTER — Encounter: Payer: Self-pay | Admitting: Family Medicine

## 2018-10-19 ENCOUNTER — Telehealth: Payer: Self-pay | Admitting: Family Medicine

## 2018-10-19 DIAGNOSIS — I10 Essential (primary) hypertension: Secondary | ICD-10-CM

## 2018-10-19 LAB — HM DIABETES EYE EXAM

## 2018-10-19 MED ORDER — AMLODIPINE BESYLATE 5 MG PO TABS: 5.0000 mg | ORAL_TABLET | Freq: Every day | ORAL | 0 refills | Status: DC

## 2018-10-19 MED FILL — ?AMLODIPINE BESYLATE 5MG TA: 5 | 30 days supply | Qty: 30 | Fill #0

## 2018-10-19 NOTE — Telephone Encounter (Signed)
Refills provided for 1 month.

## 2018-10-19 NOTE — Telephone Encounter (Signed)
New Message  1) Medication(s) Requested (by name): Amlodipine   2) Pharmacy of Choice: [facility]  3) Special Requests: Pt has appt on 10/28   Approved medications will be sent to the pharmacy, we will reach out if there is an issue.  Requests made after 3pm may not be addressed until the following business day!  If a patient is unsure of the name of the medication(s) please note and ask patient to call back when they are able to provide all info, do not send to responsible party until all information is available!

## 2018-10-22 ENCOUNTER — Other Ambulatory Visit (HOSPITAL_BASED_OUTPATIENT_CLINIC_OR_DEPARTMENT_OTHER): Payer: Self-pay | Admitting: Family Medicine

## 2018-10-22 DIAGNOSIS — E039 Hypothyroidism, unspecified: Secondary | ICD-10-CM

## 2018-10-22 DIAGNOSIS — E119 Type 2 diabetes mellitus without complications: Secondary | ICD-10-CM

## 2018-10-22 DIAGNOSIS — I10 Essential (primary) hypertension: Secondary | ICD-10-CM

## 2018-10-22 MED ORDER — METFORMIN HCL 850 MG PO TABS: 850.0000 mg | ORAL_TABLET | Freq: Two times a day (BID) | ORAL | 0 refills | Status: DC

## 2018-10-22 MED ORDER — LEVOTHYROXINE SODIUM 100 MCG PO TABS: ORAL_TABLET | ORAL | 0 refills | Status: DC

## 2018-10-22 MED ORDER — GLIMEPIRIDE 4 MG PO TABS: 4.0000 mg | ORAL_TABLET | Freq: Every day | ORAL | 0 refills | Status: DC

## 2018-10-22 MED ORDER — AMLODIPINE BESYLATE 5 MG PO TABS: 5.0000 mg | ORAL_TABLET | Freq: Every day | ORAL | 0 refills | Status: DC

## 2018-10-22 NOTE — Progress Notes (Signed)
Patient ID: Norma Clay, female   DOB: 06-20-1950, 68 y.o.   MRN: [id number]   My chart message received from patient's son that patient will need refills of amlodipine, levothyroxine, glimepiride and Metformin as she will run out of medications prior to her upcoming office visit.

## 2018-10-23 MED FILL — DORZOLAMIDE-TIMOLOL EYE DRP: 22.3-6.8 | 40 days supply | Qty: 10 | Fill #1

## 2018-10-23 MED FILL — LEVOTHYROXINE 100 MCG TAB: 100 | 30 days supply | Qty: 30 | Fill #0

## 2018-10-23 MED FILL — GLIMEPIRIDE 4 MG TABS: 4 | 30 days supply | Qty: 30 | Fill #0

## 2018-10-23 MED FILL — ?METFORMIN HCL 850 MG TABLE: 850 | 30 days supply | Qty: 60 | Fill #0

## 2018-10-29 ENCOUNTER — Ambulatory Visit: Payer: Self-pay | Admitting: Family Medicine

## 2018-10-31 ENCOUNTER — Other Ambulatory Visit: Payer: Self-pay

## 2018-10-31 ENCOUNTER — Ambulatory Visit: Payer: Self-pay | Attending: Family Medicine | Admitting: Family Medicine

## 2018-10-31 ENCOUNTER — Encounter: Payer: Self-pay | Admitting: Family Medicine

## 2018-10-31 VITALS — BP 104/68 | HR 81 | Temp 98.8°F | Resp 18 | Ht 68.0 in | Wt 167.0 lb

## 2018-10-31 DIAGNOSIS — E1169 Type 2 diabetes mellitus with other specified complication: Secondary | ICD-10-CM

## 2018-10-31 DIAGNOSIS — E785 Hyperlipidemia, unspecified: Secondary | ICD-10-CM

## 2018-10-31 DIAGNOSIS — R1013 Epigastric pain: Secondary | ICD-10-CM

## 2018-10-31 DIAGNOSIS — E039 Hypothyroidism, unspecified: Secondary | ICD-10-CM

## 2018-10-31 DIAGNOSIS — I10 Essential (primary) hypertension: Secondary | ICD-10-CM

## 2018-10-31 DIAGNOSIS — R300 Dysuria: Secondary | ICD-10-CM

## 2018-10-31 DIAGNOSIS — Z79899 Other long term (current) drug therapy: Secondary | ICD-10-CM

## 2018-10-31 DIAGNOSIS — R1030 Lower abdominal pain, unspecified: Secondary | ICD-10-CM

## 2018-10-31 DIAGNOSIS — E119 Type 2 diabetes mellitus without complications: Secondary | ICD-10-CM

## 2018-10-31 LAB — POCT GLYCOSYLATED HEMOGLOBIN (HGB A1C): Hemoglobin A1C: 6.9 % — AB (ref 4.0–5.6)

## 2018-10-31 MED ORDER — ATORVASTATIN CALCIUM 20 MG PO TABS: 20.0000 mg | ORAL_TABLET | Freq: Every day | ORAL | 2 refills | Status: DC

## 2018-10-31 MED ORDER — LOSARTAN POTASSIUM 25 MG PO TABS: 25.0000 mg | ORAL_TABLET | Freq: Every day | ORAL | 3 refills | Status: DC

## 2018-10-31 MED ORDER — AMLODIPINE BESYLATE 5 MG PO TABS: 5.0000 mg | ORAL_TABLET | Freq: Every day | ORAL | 3 refills | Status: DC

## 2018-10-31 MED ORDER — GLIMEPIRIDE 4 MG PO TABS: 4.0000 mg | ORAL_TABLET | Freq: Every day | ORAL | 1 refills | Status: DC

## 2018-10-31 MED ORDER — METFORMIN HCL 850 MG PO TABS: 850.0000 mg | ORAL_TABLET | Freq: Two times a day (BID) | ORAL | 1 refills | Status: DC

## 2018-10-31 MED ORDER — LEVOTHYROXINE SODIUM 100 MCG PO TABS: ORAL_TABLET | ORAL | 1 refills | Status: DC

## 2018-10-31 MED ORDER — PANTOPRAZOLE SODIUM 40 MG PO TBEC: 40.0000 mg | DELAYED_RELEASE_TABLET | Freq: Every day | ORAL | 3 refills | Status: DC

## 2018-10-31 MED ORDER — SULFAMETHOXAZOLE-TRIMETHOPRIM 800-160 MG PO TABS: 1.0000 | ORAL_TABLET | Freq: Two times a day (BID) | ORAL | 0 refills | Status: AC

## 2018-10-31 MED FILL — ?SULFAMETHOXAZOLE-TMP DS TB: 800-160 | 7 days supply | Qty: 14 | Fill #0

## 2018-10-31 MED FILL — ?ATORVASTATIN 20 MG TABLET: 20 | 90 days supply | Qty: 90 | Fill #0

## 2018-10-31 MED FILL — PANTOPRAZOLE SOD DR 40 MG T: 40 | 30 days supply | Qty: 30 | Fill #0

## 2018-10-31 NOTE — Progress Notes (Signed)
Established Patient Office Visit  Subjective:  Patient ID: Norma Clay, female    DOB: 18-Jun-1950  Age: 68 y.o. MRN: [id number]   Due to language barrier, patient was offered [employer] video interpretation system which she declined.  Patient wishes to have her son act as interpreter at today's visit.  CC:  Chief Complaint  Patient presents with  . Follow-up    HPI Norma Clay presents for follow-up of chronic medical issues including type 2 diabetes, hypertension, hyperlipidemia and hypothyroidism.  She reports that she has recently been seen by the eye doctor in follow-up of glaucoma.  At today's visit she has complaint of discomfort in the right ear which she describes as a dull, pressure sensation which has been off and on for weeks.  She also has complaint of some discomfort across her lower abdomen.  Son reports that she has also had recent increase complaint of mid to low back pain.         Per son, patient's home blood sugars have remained well controlled and are generally between the mid 80s to low 100s fasting.  She has had no issues with hypoglycemia.  She denies any issues with increased thirst.  Upon questioning, she has noticed some recent increase in urinary frequency which has started since her issues with lower abdominal discomfort.  She has mild discomfort at the end of urination but no burning or pain.  She has had a recent increase in mid to low back pain which is a dull, aching sensation.  She denies any current issues with fever or chills, no headache or dizziness related to blood pressure.  No sore throat or nasal congestion.  She denies any dizziness or loss of balance associated with right ear discomfort.  She is taking her cholesterol medicine and denies any increased muscle or joint pain associated with the use of cholesterol medicine.  She is still taking her thyroid medicine and denies any issues with constipation, excessive fatigue or issues with swelling.  Past  Medical History:  Diagnosis Date  . Diabetes mellitus without complication ([place])   . Glaucoma   . Hypertension     Past Surgical History:  Procedure Laterality Date  . GLAUCOMA SURGERY    . THYROIDECTOMY      Family History  Problem Relation Age of Onset  . Diabetes Mother     Social History   Socioeconomic History  . Marital status: Married    Spouse name: Not on file  . Number of children: Not on file  . Years of education: Not on file  . Highest education level: Not on file  Occupational History  . Not on file  Social Needs  . Financial resource strain: Not on file  . Food insecurity    Worry: Not on file    Inability: Not on file  . Transportation needs    Medical: Not on file    Non-medical: Not on file  Tobacco Use  . Smoking status: Never Smoker  . Smokeless tobacco: Never Used  Substance and Sexual Activity  . Alcohol use: No  . Drug use: No  . Sexual activity: Not Currently  Lifestyle  . Physical activity    Days per week: Not on file    Minutes per session: Not on file  . Stress: Not on file  Relationships  . Social Herbalist on phone: Not on file    Gets together: Not on file    Attends religious service:  Not on file    Active member of club or organization: Not on file    Attends meetings of clubs or organizations: Not on file    Relationship status: Not on file  . Intimate partner violence    Fear of current or ex partner: Not on file    Emotionally abused: Not on file    Physically abused: Not on file    Forced sexual activity: Not on file  Other Topics Concern  . Not on file  Social History Narrative  . Not on file    Outpatient Medications Prior to Visit  Medication Sig Dispense Refill  . atropine 1 % ophthalmic solution Place one drop into the right eye 2 (two) times daily for 14 days. Do not take tonight.  Dr. Ander Slade will evaluate your post-operative status tomorrow and may make changes to the duration and dose of this  medication.    . Blood Glucose Monitoring Suppl (TRUE METRIX METER) w/Device KIT 1 each by Does not apply route 2 (two) times daily. 1 kit 0  . calcitRIOL (ROCALTROL) 0.25 MCG capsule Take 2 capsules (0.5 mcg total) by mouth daily. 90 capsule 6  . calcium-vitamin D (OSCAL 500/200 D-3) 500-200 MG-UNIT tablet Take 1 tablet by mouth daily.    . COBALAMINE COMBINATIONS PO Take 500 mcg by mouth. Cobal (Mecobalamin)    . diclofenac sodium (VOLTAREN) 1 % GEL Apply 2 g topically 4 (four) times daily. Rub into affected area of foot 2 to 4 times daily 100 g 2  . dorzolamide-timolol (COSOPT) 22.3-6.8 MG/ML ophthalmic solution Place 1 drop into both eyes 2 (two) times daily. 10 mL 0  . glucose blood test strip Use as instructed 100 each 12  . metroNIDAZOLE (FLAGYL) 500 MG tablet Take 1 tablet (500 mg total) by mouth 2 (two) times daily. 14 tablet 0  . moxifloxacin (VIGAMOX) 0.5 % ophthalmic solution Place one drop into the right eye 3 (three) times a day for 21 days. Do not take tonight.  Dr. Ander Slade will evaluate your post-operative status tomorrow and may make changes to the duration and dose of this medication.    Marland Kitchen neomycin-polymyxin-dexameth (MAXITROL) 0.1 % OINT Do not use tonight!   Apply a strip of ointment the size of a grain of rice to RIGHT eye at bedtime. This must be the last eye medicine of the day.   Dr. Ander Slade will evaluate your post-operative status tomorrow and may make changes to the duration and dose of this medication.    . Netarsudil Dimesylate 0.02 % SOLN Apply 1 drop to eye every evening.    . prednisoLONE acetate (PRED FORTE) 1 % ophthalmic suspension Apply to eye.    . predniSONE 5 MG/5ML solution Take by mouth daily with breakfast.    . Travoprost, BAK Free, (TRAVATAN Z) 0.004 % SOLN ophthalmic solution Apply 1 drop to eye at bedtime.    Marland Kitchen amLODipine (NORVASC) 5 MG tablet Take 1 tablet (5 mg total) by mouth daily. To lower blood pressure 30 tablet 0  . atorvastatin (LIPITOR) 20 MG  tablet TAKE 1 TABLET (20 MG TOTAL) BY MOUTH DAILY. 30 tablet 2  . glimepiride (AMARYL) 4 MG tablet Take 1 tablet (4 mg total) by mouth daily with breakfast. To lower blood sugar 30 tablet 0  . levothyroxine (SYNTHROID) 100 MCG tablet 1 pill once daily.  Do not eat for 30 minutes after taking p.o. 30 tablet 0  . losartan (COZAAR) 25 MG tablet TAKE 1 TABLET (  25 MG TOTAL) BY MOUTH DAILY. 30 tablet 2  . metFORMIN (GLUCOPHAGE) 850 MG tablet Take 1 tablet (850 mg total) by mouth 2 (two) times daily with a meal. 60 tablet 0  . pantoprazole (PROTONIX) 40 MG tablet Take 1 tablet (40 mg total) by mouth daily. 30 tablet 3  . sulfamethoxazole-trimethoprim (BACTRIM DS) 800-160 MG tablet Take 1 tablet by mouth 2 (two) times daily. 20 tablet 0  . TRUEPLUS LANCETS 28G MISC Use to check blood sugar up to 3 times daily. 100 each 11  . vitamin A 10000 UNIT capsule Take 10,000 Units by mouth daily.     No facility-administered medications prior to visit.     Allergies  Allergen Reactions  . Clindamycin/Lincomycin Rash  . Penicillins Rash    Patient was placed on a form of penicillin by her dentist and developed a rash therefore medication was changed to clindamycin.  Per son, patient is not allergic to clindamycin    ROS Review of Systems  Constitutional: Positive for fatigue. Negative for chills and fever.  HENT: Positive for ear pain (right). Negative for congestion, postnasal drip, rhinorrhea, sinus pressure, sinus pain, sore throat and trouble swallowing.   Eyes: Positive for visual disturbance (has glaucoma, wears glasses). Negative for photophobia.  Respiratory: Negative for cough and shortness of breath.   Gastrointestinal: Positive for abdominal pain (lower abdominal discomfort). Negative for blood in stool, constipation, diarrhea and nausea.  Endocrine: Positive for polyuria. Negative for polydipsia and polyphagia.  Genitourinary: Positive for dysuria and frequency.  Musculoskeletal: Positive for  back pain and gait problem (pain in left foot- has seen podiatry). Negative for arthralgias.  Neurological: Positive for headaches (on right side near area of ear pain). Negative for dizziness.  Hematological: Negative for adenopathy. Does not bruise/bleed easily.      Objective:    Physical Exam  Constitutional: She is oriented to person, place, and time. She appears well-developed and well-nourished.  HENT:  Right Ear: Tympanic membrane, external ear and ear canal normal.  Left Ear: Tympanic membrane, external ear and ear canal normal.  Nose: Mucosal edema present.  Neck: Normal range of motion. Neck supple. No JVD present. No thyromegaly present.  Cardiovascular: Normal rate and regular rhythm.  Pulmonary/Chest: Effort normal and breath sounds normal.  Abdominal: Soft. There is abdominal tenderness (mild epigastric and suprapubic lower abdominal discomfort). There is no rebound and no guarding.  Musculoskeletal:        General: Tenderness and deformity (hammertoe deformities on left foot) present. No edema.     Comments: Mild lumbosacral tenderness to palp  Lymphadenopathy:    She has no cervical adenopathy.  Neurological: She is alert and oriented to person, place, and time.  Skin: Skin is warm and dry.  No active skin breakdown on the feet  Psychiatric: She has a normal mood and affect. Her behavior is normal.  Nursing note and vitals reviewed.   BP 104/68 (BP Location: Left Arm, Patient Position: Sitting, Cuff Size: Large)   Pulse 81   Temp 98.8 F (37.1 C) (Oral)   Resp 18   Ht _0  (1.727 m)   Wt 167 lb (75.8 kg)   SpO2 97%   BMI 25.39 kg/m  Wt Readings from Last 3 Encounters:  10/31/18 167 lb (75.8 kg)  06/28/18 164 lb 3.2 oz (74.5 kg)  05/30/18 162 lb 6.4 oz (73.7 kg)     Health Maintenance Due  Topic Date Due  . OPHTHALMOLOGY EXAM  01/04/1959  .  MAMMOGRAM  01/04/1999  . COLONOSCOPY  01/04/1999  . FOOT EXAM  04/18/2017  . PNA vac Low Risk Adult (2 of 2  - PPSV23) 04/18/2017  . INFLUENZA VACCINE  08/04/2018   Patient has had DM foot exam this year and has seen podiatry   Lab Results  Component Value Date   TSH 0.407 (L) 10/31/2018   Lab Results  Component Value Date   WBC 6.3 10/31/2018   HGB 12.0 10/31/2018   HCT 36.1 10/31/2018   MCV 93 10/31/2018   PLT 232 10/31/2018   Lab Results  Component Value Date   NA 139 10/31/2018   K 4.0 10/31/2018   CO2 24 10/31/2018   GLUCOSE 220 (H) 10/31/2018   BUN 25 10/31/2018   CREATININE 0.98 10/31/2018   BILITOT 0.3 10/31/2018   ALKPHOS 107 10/31/2018   AST 24 10/31/2018   ALT 14 10/31/2018   PROT 7.1 10/31/2018   ALBUMIN 4.1 10/31/2018   CALCIUM 7.1 (L) 10/31/2018   ANIONGAP 14 10/30/2014   GFR 45.36 (L) 05/30/2018   Lab Results  Component Value Date   CHOL 251 (H) 09/14/2017   Lab Results  Component Value Date   HDL 70 09/14/2017   Lab Results  Component Value Date   LDLCALC 164 (H) 09/14/2017   Lab Results  Component Value Date   TRIG 85 09/14/2017   Lab Results  Component Value Date   CHOLHDL 3.6 09/14/2017   Lab Results  Component Value Date   HGBA1C 6.9 (A) 10/31/2018      Assessment & Plan:  1. Type 2 diabetes mellitus without complication, without long-term current use of insulin ([facility]) Hemoglobin A1c done at today's visit which was 6.9 consistent with patient's blood sugars being well controlled.  She will continue the use of Amaryl prior to her first meal of the day along with continuation of Metformin.  She will have comprehensive metabolic panel in follow-up of her diabetes as well as use of medication.  She has had diabetic eye exam as well as diabetic foot exam earlier in the year. - Comprehensive metabolic panel - HgB V6H - glimepiride (AMARYL) 4 MG tablet; Take 1 tablet (4 mg total) by mouth daily with breakfast. To lower blood sugar  Dispense: 90 tablet; Refill: 1 - metFORMIN (GLUCOPHAGE) 850 MG tablet; Take 1 tablet (850 mg total) by mouth 2  (two) times daily with a meal.  Dispense: 180 tablet; Refill: 1  2. Acquired hypothyroidism Patient will have TSH in follow-up of hypothyroidism.  She reports no current symptoms suggestive of either hypo or hyperthyroidism at today's visit.  She will be notified if the changes needed in the dose of her thyroid medication based on lab result. - TSH - levothyroxine (SYNTHROID) 100 MCG tablet; 1 pill once daily.  Do not eat for 30 minutes after taking p.o.  Dispense: 90 tablet; Refill: 1  3. Lower abdominal pain Patient with complaint of lower abdominal pain and also has had recent increased urinary frequency as well as back pain.  Suspect that patient has urinary tract infection and patient will have urinalysis and urine will be sent for culture.  She will be placed on Bactrim DS while urine culture results are pending and she will be notified if any change in antibiotic therapy is needed based on the results and son is aware that patient should follow-up in 1 to 2 weeks if she has continued abdominal pain or should go to urgent care/ED if she has any  worsening of her abdominal pain.  Will check CBC for elevated white blood cell count of blood loss which might indicate the need for further evaluation of patient's lower abdominal pain. - Urine Culture - CBC  4. Encounter for long-term (current) use of medications Patient will have comprehensive metabolic panel in follow-up of long-term use of medications including statin therapy and medication for treatment of diabetes. - Comprehensive metabolic panel  5. Hypocalcemia She has had past issues with hypocalcemia and will have vitamin D level and we will recheck calcium as part of comprehensive metabolic panel - Comprehensive metabolic panel - Vitamin D, 25-hydroxy  6. Essential hypertension Blood pressure is well controlled at today's visit and she denies any issues with hypotension/dizziness or presyncopal symptoms.  Refills provided of amlodipine  and losartan. - amLODipine (NORVASC) 5 MG tablet; Take 1 tablet (5 mg total) by mouth daily. To lower blood pressure  Dispense: 90 tablet; Refill: 3 - losartan (COZAAR) 25 MG tablet; Take 1 tablet (25 mg total) by mouth daily.  Dispense: 90 tablet; Refill: 3  7. Hyperlipidemia associated with type 2 diabetes mellitus ([place]) Refill provided of atorvastatin for treatment of hyperlipidemia associated with type 2 diabetes.  Suggested fasting lab visit for recheck of lipids.  Last lipid panel was done 09/14/2017 and at that time cholesterol was elevated at 251 and LDL of 164.  Continue atorvastatin and low-fat diet. - atorvastatin (LIPITOR) 20 MG tablet; Take 1 tablet (20 mg total) by mouth daily.  Dispense: 90 tablet; Refill: 2  8. Epigastric pain On exam, patient with mild epigastric pain and she complains of occasional reflux symptoms upon questioning.  Prescription provided for pantoprazole 40 mg daily to decrease stomach acid and patient should contact the office if symptoms are not improving - pantoprazole (PROTONIX) 40 MG tablet; Take 1 tablet (40 mg total) by mouth daily. To decrease stomach acid  Dispense: 90 tablet; Refill: 3  9. Dysuria She has complaint of dysuria/discomfort at the end of urination but not actual pain or burning.  Urinalysis will be done to look for urinary tract infection as she also has some back pain and lower abdominal discomfort/suprapubic tenderness to palpation.  Urine will be sent for culture and patient will be placed on Bactrim DS in the interim. - sulfamethoxazole-trimethoprim (BACTRIM DS) 800-160 MG tablet; Take 1 tablet by mouth 2 (two) times daily for 7 days. For bladder infection  Dispense: 14 tablet; Refill: 0  An After Visit Summary was printed and given to the patient.  Follow-up: Return in about 5 months (around 03/31/2019) for chronic issues;Abdominal pain: 1 to 2 weeks if not better; chronic issues.   Antony Blackbird, MD

## 2018-11-01 LAB — COMPREHENSIVE METABOLIC PANEL WITH GFR
ALT: 14 IU/L (ref 0–32)
AST: 24 IU/L (ref 0–40)
Albumin/Globulin Ratio: 1.4 (ref 1.2–2.2)
Albumin: 4.1 g/dL (ref 3.8–4.8)
Alkaline Phosphatase: 107 IU/L (ref 39–117)
BUN/Creatinine Ratio: 26 (ref 12–28)
BUN: 25 mg/dL (ref 8–27)
Bilirubin Total: 0.3 mg/dL (ref 0.0–1.2)
CO2: 24 mmol/L (ref 20–29)
Calcium: 7.1 mg/dL — ABNORMAL LOW (ref 8.7–10.3)
Chloride: 100 mmol/L (ref 96–106)
Creatinine, Ser: 0.98 mg/dL (ref 0.57–1.00)
GFR calc Af Amer: 68 mL/min/1.73
GFR calc non Af Amer: 59 mL/min/1.73 — ABNORMAL LOW
Globulin, Total: 3 g/dL (ref 1.5–4.5)
Glucose: 220 mg/dL — ABNORMAL HIGH (ref 65–99)
Potassium: 4 mmol/L (ref 3.5–5.2)
Sodium: 139 mmol/L (ref 134–144)
Total Protein: 7.1 g/dL (ref 6.0–8.5)

## 2018-11-01 LAB — CBC
Hematocrit: 36.1 % (ref 34.0–46.6)
Hemoglobin: 12 g/dL (ref 11.1–15.9)
MCH: 30.9 pg (ref 26.6–33.0)
MCHC: 33.2 g/dL (ref 31.5–35.7)
MCV: 93 fL (ref 79–97)
Platelets: 232 x10E3/uL (ref 150–450)
RBC: 3.88 x10E6/uL (ref 3.77–5.28)
RDW: 12.6 % (ref 11.7–15.4)
WBC: 6.3 x10E3/uL (ref 3.4–10.8)

## 2018-11-01 LAB — TSH: TSH: 0.407 u[IU]/mL — ABNORMAL LOW (ref 0.450–4.500)

## 2018-11-01 LAB — VITAMIN D 25 HYDROXY (VIT D DEFICIENCY, FRACTURES): Vit D, 25-Hydroxy: 36.7 ng/mL (ref 30.0–100.0)

## 2018-11-04 LAB — URINE CULTURE

## 2018-11-05 ENCOUNTER — Encounter: Payer: Self-pay | Admitting: *Deleted

## 2018-11-12 ENCOUNTER — Ambulatory Visit: Payer: Self-pay | Admitting: Family Medicine

## 2018-11-19 MED FILL — TRUE METRIX TEST STRIP: 30 days supply | Qty: 100 | Fill #8

## 2018-11-19 MED FILL — DORZOLAMIDE-TIMOLOL EYE DRP: 22.3-6.8 | 40 days supply | Qty: 10 | Fill #2

## 2018-11-19 MED FILL — GLIMEPIRIDE 4 MG TABS: 4 | 90 days supply | Qty: 90 | Fill #0

## 2018-11-19 MED FILL — ?AMLODIPINE BESYLATE 5MG TA: 5 | 30 days supply | Qty: 30 | Fill #0

## 2018-11-19 MED FILL — LEVOTHYROXINE 100 MCG TAB: 100 | 30 days supply | Qty: 30 | Fill #0

## 2018-11-19 MED FILL — ?ATORVASTATIN 20 MG TABLET: 20 | 30 days supply | Qty: 30 | Fill #0

## 2018-11-19 MED FILL — metFORMIN HCL 850 MG TABS: 850 | 90 days supply | Qty: 180 | Fill #0

## 2018-11-19 MED FILL — TRAVATAN Z 0.004% EYE DROP: 0.004 | 18 days supply | Qty: 3 | Fill #3

## 2018-11-21 MED FILL — LOSARTAN POTASSIUM 25 MG TA: 25 | 90 days supply | Qty: 90 | Fill #0

## 2018-11-28 ENCOUNTER — Other Ambulatory Visit: Payer: Self-pay

## 2018-12-03 ENCOUNTER — Encounter: Payer: Self-pay | Admitting: Internal Medicine

## 2018-12-03 ENCOUNTER — Ambulatory Visit: Payer: Self-pay | Attending: Family Medicine

## 2018-12-03 ENCOUNTER — Ambulatory Visit (INDEPENDENT_AMBULATORY_CARE_PROVIDER_SITE_OTHER): Payer: Self-pay | Admitting: Internal Medicine

## 2018-12-03 ENCOUNTER — Other Ambulatory Visit: Payer: Self-pay

## 2018-12-03 DIAGNOSIS — E208 Other hypoparathyroidism: Secondary | ICD-10-CM

## 2018-12-03 LAB — BASIC METABOLIC PANEL
BUN: 28 mg/dL — ABNORMAL HIGH (ref 6–23)
CO2: 27 mEq/L (ref 19–32)
Calcium: 7.5 mg/dL — ABNORMAL LOW (ref 8.4–10.5)
Chloride: 101 mEq/L (ref 96–112)
Creatinine, Ser: 0.92 mg/dL (ref 0.40–1.20)
GFR: 60.37 mL/min (ref >60.00)
Glucose, Bld: 192 mg/dL — ABNORMAL HIGH (ref 70–99)
Potassium: 3.8 mEq/L (ref 3.5–5.1)
Sodium: 137 mEq/L (ref 135–145)

## 2018-12-03 LAB — ALBUMIN: Albumin: 3.6 g/dL (ref 3.5–5.2)

## 2018-12-03 MED ORDER — OYSTER SHELL CALCIUM 500 MG PO TABS: 2.0000 | ORAL_TABLET | Freq: Two times a day (BID) | ORAL | 11 refills | Status: DC

## 2018-12-03 MED ORDER — CALCITRIOL 0.25 MCG PO CAPS: 0.5000 ug | ORAL_CAPSULE | Freq: Every day | ORAL | 3 refills | Status: DC

## 2018-12-03 NOTE — Progress Notes (Signed)
Name: Norma Clay  MRN/ DOB: [id number], 1950/12/18    Age/ Sex: 68 y.o., female     PCP: Antony Blackbird, MD   Reason for Endocrinology Evaluation: Post-surgical hypoparathyroidism     Initial Endocrinology Clinic Visit: 12/08/17    PATIENT IDENTIFIER: [name] is a 68 y.o., female with a past medical history of Total thyroidectomy and T2DM. She has followed with [facility] since 12/08/2017 for consultative assistance with management of her Post-surgical hypoparathyroidism.   HISTORICAL SUMMARY:  She  moved from [place] in 2019. 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 She denies history of thyroid cancer. Pt noted hand spasms following 2nd surgery and has been on Calcium and Calcitriol since. Historically has compliance issues which results in serum calcium fluctuations.  24-hr urine collection normal 06/2018  SUBJECTIVE:   Today (12/03/2018):  Norma Clay is here with her son Deforest Hoyles. She is here for a follow up on hypocalcemia. Since her last visit , she has another serum calcium at 7.0 mg/dL, the patient states  Compliance with calcium carbonate at 3 tablets daily and calcitriol to 0.5 mcg daily. But there's a concern about compliance. She has stopped taking Vitamin D3 on her own but son made sure she has been back on it for the past month.   Today she denies any shortness of breath, she denies any numbness or perioral tingling, but she does endorse fatigue and aches.    ROS:  As per HPI.   HISTORY:  Past Medical History:  Past Medical History:  Diagnosis Date  . Diabetes mellitus without complication ([place])   . Glaucoma   . Hypertension    Past Surgical History:  Past Surgical History:  Procedure Laterality Date  . GLAUCOMA SURGERY    . THYROIDECTOMY      Social History:  reports that she has never smoked. She has never used smokeless tobacco. She reports that she  does not drink alcohol or use drugs.  Family History: family history includes Diabetes in her mother.   HOME MEDICATIONS: Allergies as of 12/03/2018      Reactions   Clindamycin/lincomycin Rash   Penicillins Rash   Patient was placed on a form of penicillin by her dentist and developed a rash therefore medication was changed to clindamycin.  Per son, patient is not allergic to clindamycin      Medication List       Accurate as of December 03, 2018  1:32 PM. If you have any questions, ask your nurse or doctor.        amLODipine 5 MG tablet Commonly known as: NORVASC Take 1 tablet (5 mg total) by mouth daily. To lower blood pressure   atorvastatin 20 MG tablet Commonly known as: LIPITOR Take 1 tablet (20 mg total) by mouth daily.   atropine 1 % ophthalmic solution Place one drop into the right eye 2 (two) times daily for 14 days. Do not take tonight.  Dr. Ander Slade will evaluate your post-operative status tomorrow and may make changes to the duration and dose of this medication.   calcitRIOL 0.25 MCG capsule Commonly known as: ROCALTROL Take 2 capsules (0.5 mcg total) by mouth daily.   COBALAMINE COMBINATIONS PO Take 500 mcg by mouth. Cobal (Mecobalamin)   diclofenac sodium 1 % Gel Commonly known as: VOLTAREN Apply 2 g topically 4 (four) times daily. Rub into affected area of foot 2 to  4 times daily   dorzolamide-timolol 22.3-6.8 MG/ML ophthalmic solution Commonly known as: COSOPT Place 1 drop into both eyes 2 (two) times daily.   glimepiride 4 MG tablet Commonly known as: AMARYL Take 1 tablet (4 mg total) by mouth daily with breakfast. To lower blood sugar   glucose blood test strip Use as instructed   levothyroxine 100 MCG tablet Commonly known as: SYNTHROID 1 pill once daily.  Do not eat for 30 minutes after taking p.o.   losartan 25 MG tablet Commonly known as: COZAAR Take 1 tablet (25 mg total) by mouth daily.   metFORMIN 850 MG tablet Commonly known as:  GLUCOPHAGE Take 1 tablet (850 mg total) by mouth 2 (two) times daily with a meal.   metroNIDAZOLE 500 MG tablet Commonly known as: FLAGYL Take 1 tablet (500 mg total) by mouth 2 (two) times daily.   moxifloxacin 0.5 % ophthalmic solution Commonly known as: VIGAMOX Place one drop into the right eye 3 (three) times a day for 21 days. Do not take tonight.  Dr. Ander Slade will evaluate your post-operative status tomorrow and may make changes to the duration and dose of this medication.   neomycin-polymyxin-dexameth 0.1 % Oint Commonly known as: MAXITROL Do not use tonight!   Apply a strip of ointment the size of a grain of rice to RIGHT eye at bedtime. This must be the last eye medicine of the day.   Dr. Ander Slade will evaluate your post-operative status tomorrow and may make changes to the duration and dose of this medication.   Netarsudil Dimesylate 0.02 % Soln Apply 1 drop to eye every evening.   Oscal 500/200 D-3 500-200 MG-UNIT tablet Generic drug: calcium-vitamin D Take 1 tablet by mouth daily.   pantoprazole 40 MG tablet Commonly known as: PROTONIX Take 1 tablet (40 mg total) by mouth daily. To decrease stomach acid   prednisoLONE acetate 1 % ophthalmic suspension Commonly known as: PRED FORTE Apply to eye.   predniSONE 5 MG/5ML solution Take by mouth daily with breakfast.   Travatan Z 0.004 % Soln ophthalmic solution Generic drug: Travoprost (BAK Free) Apply 1 drop to eye at bedtime.   True Metrix Meter w/Device Kit 1 each by Does not apply route 2 (two) times daily.   TRUEplus Lancets 28G Misc Use to check blood sugar up to 3 times daily.   vitamin A 10000 UNIT capsule Take 10,000 Units by mouth daily.         OBJECTIVE:   PHYSICAL EXAM: VS: BP (!) 114/58 (BP Location: Left Arm, Patient Position: Sitting, Cuff Size: Normal)   Pulse 76   Temp 98.2 F (36.8 C)   Ht '5\' 8"'  (1.727 m)   Wt 171 lb 6.4 oz (77.7 kg)   SpO2 98%   BMI 26.06 kg/m    EXAM: General:  Pt appears well and is in NAD  Lungs: Clear with good BS bilat with no rales, rhonchi, or wheezes  Heart: Auscultation: RRR.  Abdomen: Normoactive bowel sounds, soft, nontender, without masses or organomegaly palpable  Extremities: BL LE: No pretibial edema   Mental Status: Judgment, insight: Intact Orientation: Oriented to time, place, and person Mood and affect: No depression, anxiety, or agitation     DATA REVIEWED:   Results for CLAUDIA, GREENLEY (MRN [id number]) as of 12/03/2018 13:33  Ref. Range 12/03/2018 10:18  Sodium Latest Ref Range: 135 - 145 mEq/L 137  Potassium Latest Ref Range: 3.5 - 5.1 mEq/L 3.8  Chloride Latest Ref Range: 96 -  112 mEq/L 101  CO2 Latest Ref Range: 19 - 32 mEq/L 27  Glucose Latest Ref Range: 70 - 99 mg/dL 192 (H)  BUN Latest Ref Range: 6 - 23 mg/dL 28 (H)  Creatinine Latest Ref Range: 0.40 - 1.20 mg/dL 0.92  Calcium Latest Ref Range: 8.4 - 10.5 mg/dL 7.5 (L)  Albumin Latest Ref Range: 3.5 - 5.2 g/dL 3.6  GFR Latest Ref Range: >60.00 mL/min 60.37      ASSESSMENT / PLAN / RECOMMENDATIONS:   1. Surgical Hypoparathyroidism :    - Pt is asymptomatic  - There has been concerns about compliance issues,hence the fluctuating serum calcium  - She declines to use a "pill box" stating she had tried it in the past and didn't like it - I have advised her to take calcium carbonate with lunch and supper meals for better absorption.  - The goals of therapy in patients with hypoparathyroidism are to relieve symptoms, to raise and maintain the serum calcium concentration in the low-normal range( 8.0 to 9 mg/dL), and to prevent iatrogenic development of kidney stones. Attainment of higher serum calcium values is not necessary and is usually limited by the development of hypercalciuria due to the loss of renal calcium-retaining effects of parathyroid hormone .  - The major side effects of calcium and vitamin D replacement in patients with hypoparathyroidism are  hypercalcemia and hypercalciuria, which, if chronic, can cause nephrolithiasis, nephrocalcinosis, and renal failure. Hypercalciuria is the earliest sign of toxicity and can develop in the absence of hypercalcemia. But her 24-hr urine urine calcium excretion was normal in 06/2018  - Corrected serum calcium on today's labs is 7.86  mg/dL. Renal function is stable.   Medications:   Increase  Calcium Carbonate (Oyster shell ) 500 mg 2 tabs with lunch and 2 tabs with Supper  Continue Calcitriol 0.5 mcg daily  (0.25 mcg x2) Continue Vitamin D3 400 iu daily         F/u in 4 months       Signed electronically by: Mack Guise, MD  [facility]  [address] [place], [place] [address] Phone: [phone] FAX: [phone]      CC: Antony Blackbird, MD [place] [address] Phone: [phone]  Fax: [phone]   Return to Endocrinology clinic as below: Future Appointments  Date Time Provider [place]  12/03/2018  3:00 PM [facility] FINANCIAL COUNSELOR [facility] None  02/08/2019  2:50 PM Antony Blackbird, MD [facility] None  04/01/2019  9:30 AM Jovon Winterhalter, Melanie Crazier, MD [id number] None

## 2018-12-03 NOTE — Patient Instructions (Signed)
-   Continue Calcium Carbonate 500 mg TWO tablets with Lunch and ONE tablet with Dinner  - Continue Calcitriol two capsules a day  - Continue Over the counter Vitamin D3 400 iu daily

## 2018-12-06 ENCOUNTER — Other Ambulatory Visit: Payer: Self-pay | Admitting: Internal Medicine

## 2018-12-07 ENCOUNTER — Other Ambulatory Visit: Payer: Self-pay

## 2018-12-07 MED ORDER — CALCITRIOL 0.25 MCG PO CAPS: 0.5000 ug | ORAL_CAPSULE | Freq: Every day | ORAL | 3 refills | Status: DC

## 2018-12-07 MED FILL — CALCITRIOL 0.25 MCG CAPS: 0.25 | 30 days supply | Qty: 60 | Fill #0

## 2018-12-17 MED FILL — TRUE METRIX TEST STRIP: 30 days supply | Qty: 100 | Fill #9

## 2018-12-17 MED FILL — TRAVATAN Z 0.004% EYE DROP: 0.004 | 18 days supply | Qty: 3 | Fill #4

## 2018-12-17 MED FILL — DORZOLAMIDE-TIMOLOL EYE DRP: 22.3-6.8 | 40 days supply | Qty: 10 | Fill #3

## 2018-12-17 MED FILL — AMLODIPINE BESYLATE 5 MG TA: 5 | 30 days supply | Qty: 30 | Fill #1

## 2018-12-17 MED FILL — LEVOTHYROXINE 100 MCG TAB: 100 | 30 days supply | Qty: 30 | Fill #1

## 2019-01-01 ENCOUNTER — Encounter: Payer: Self-pay | Admitting: Urology

## 2019-01-11 ENCOUNTER — Other Ambulatory Visit: Payer: Self-pay | Admitting: Podiatry

## 2019-01-11 MED FILL — DICLOFENAC SODIUM 1% GEL: 1 | 12 days supply | Qty: 100 | Fill #0

## 2019-01-11 MED FILL — TRUE METRIX TEST STRIP: 30 days supply | Qty: 100 | Fill #10

## 2019-01-11 MED FILL — LOSARTAN POTASSIUM 25 MG TA: 25 | 90 days supply | Qty: 90 | Fill #1

## 2019-01-11 MED FILL — TRAVATAN Z 0.004% EYE DROP: 0.004 | 18 days supply | Qty: 3 | Fill #5

## 2019-01-11 MED FILL — ?ATORVASTATIN 20 MG TABLET: 20 | 30 days supply | Qty: 30 | Fill #1

## 2019-01-11 MED FILL — GLIMEPIRIDE 4 MG TABS: 4 | 90 days supply | Qty: 90 | Fill #1

## 2019-01-11 MED FILL — ?METFORMIN HCL 850 MG TABLE: 850 | 90 days supply | Qty: 180 | Fill #1

## 2019-01-11 MED FILL — LEVOTHYROXINE 100 MCG TAB: 100 | 30 days supply | Qty: 30 | Fill #2

## 2019-01-11 MED FILL — DORZOLAMIDE-TIMOLOL EYE DRP: 22.3-6.8 | 40 days supply | Qty: 10 | Fill #4

## 2019-01-11 MED FILL — AMLODIPINE BESYLATE 5 MG TA: 5 | 30 days supply | Qty: 30 | Fill #2

## 2019-02-08 ENCOUNTER — Other Ambulatory Visit: Payer: Self-pay

## 2019-02-08 ENCOUNTER — Encounter: Payer: Self-pay | Admitting: Family Medicine

## 2019-02-08 ENCOUNTER — Ambulatory Visit: Payer: Self-pay | Attending: Family Medicine | Admitting: Family Medicine

## 2019-02-08 VITALS — BP 125/79 | HR 84 | Ht 68.0 in | Wt 179.0 lb

## 2019-02-08 DIAGNOSIS — I10 Essential (primary) hypertension: Secondary | ICD-10-CM

## 2019-02-08 DIAGNOSIS — K5900 Constipation, unspecified: Secondary | ICD-10-CM

## 2019-02-08 DIAGNOSIS — Z758 Other problems related to medical facilities and other health care: Secondary | ICD-10-CM

## 2019-02-08 DIAGNOSIS — N3001 Acute cystitis with hematuria: Secondary | ICD-10-CM

## 2019-02-08 DIAGNOSIS — R002 Palpitations: Secondary | ICD-10-CM

## 2019-02-08 DIAGNOSIS — R1030 Lower abdominal pain, unspecified: Secondary | ICD-10-CM

## 2019-02-08 DIAGNOSIS — Z603 Acculturation difficulty: Secondary | ICD-10-CM

## 2019-02-08 DIAGNOSIS — Z789 Other specified health status: Secondary | ICD-10-CM

## 2019-02-08 DIAGNOSIS — E119 Type 2 diabetes mellitus without complications: Secondary | ICD-10-CM

## 2019-02-08 DIAGNOSIS — E1169 Type 2 diabetes mellitus with other specified complication: Secondary | ICD-10-CM

## 2019-02-08 DIAGNOSIS — R1013 Epigastric pain: Secondary | ICD-10-CM

## 2019-02-08 DIAGNOSIS — E785 Hyperlipidemia, unspecified: Secondary | ICD-10-CM

## 2019-02-08 DIAGNOSIS — E039 Hypothyroidism, unspecified: Secondary | ICD-10-CM

## 2019-02-08 LAB — POCT URINALYSIS DIP (CLINITEK)
Bilirubin, UA: NEGATIVE
Glucose, UA: NEGATIVE mg/dL
Ketones, POC UA: NEGATIVE mg/dL
Nitrite, UA: NEGATIVE
POC PROTEIN,UA: NEGATIVE
Spec Grav, UA: <=1.005 — AB
Urobilinogen, UA: 0.2 U/dL
pH, UA: 6.5

## 2019-02-08 LAB — POCT GLYCOSYLATED HEMOGLOBIN (HGB A1C): HbA1c, POC (controlled diabetic range): 7.2 % — AB (ref 0.0–7.0)

## 2019-02-08 LAB — GLUCOSE, POCT (MANUAL RESULT ENTRY): POC Glucose: 131 mg/dL — AB (ref 70–99)

## 2019-02-08 MED ORDER — ATORVASTATIN CALCIUM 20 MG PO TABS: 20.0000 mg | ORAL_TABLET | Freq: Every day | ORAL | 2 refills | Status: DC

## 2019-02-08 MED ORDER — LEVOFLOXACIN 250 MG PO TABS: 250.0000 mg | ORAL_TABLET | Freq: Every day | ORAL | 0 refills | Status: DC

## 2019-02-08 MED ORDER — LOSARTAN POTASSIUM 25 MG PO TABS: 25.0000 mg | ORAL_TABLET | Freq: Every day | ORAL | 3 refills | Status: DC

## 2019-02-08 MED ORDER — LEVOTHYROXINE SODIUM 100 MCG PO TABS: ORAL_TABLET | ORAL | 1 refills | Status: DC

## 2019-02-08 MED ORDER — GLIMEPIRIDE 4 MG PO TABS: ORAL_TABLET | ORAL | 0 refills | Status: DC

## 2019-02-08 MED ORDER — PANTOPRAZOLE SODIUM 20 MG PO TBEC: 20.0000 mg | DELAYED_RELEASE_TABLET | Freq: Two times a day (BID) | ORAL | 3 refills | Status: DC

## 2019-02-08 MED ORDER — AMLODIPINE BESYLATE 5 MG PO TABS: 5.0000 mg | ORAL_TABLET | Freq: Every day | ORAL | 3 refills | Status: DC

## 2019-02-08 MED ORDER — METFORMIN HCL 850 MG PO TABS: 850.0000 mg | ORAL_TABLET | Freq: Two times a day (BID) | ORAL | 1 refills | Status: DC

## 2019-02-08 MED ORDER — GLIMEPIRIDE 4 MG PO TABS: 4.0000 mg | ORAL_TABLET | Freq: Every day | ORAL | 1 refills | Status: DC

## 2019-02-08 MED FILL — ?ATORVASTATIN 20 MG TABLET: 20 | 30 days supply | Qty: 30 | Fill #0

## 2019-02-08 MED FILL — levoFLOXacin 250 MG TABS: 250 | 5 days supply | Qty: 5 | Fill #0

## 2019-02-08 MED FILL — AMLODIPINE BESYLATE 5 MG TA: 5 | 30 days supply | Qty: 30 | Fill #0

## 2019-02-08 MED FILL — PANTOPRAZOLE SOD DR 20 MG T: 20 | 30 days supply | Qty: 60 | Fill #0

## 2019-02-08 MED FILL — ?LEVOTHYROXINE 100 MCG TAB: 100 | 30 days supply | Qty: 30 | Fill #0

## 2019-02-08 NOTE — Progress Notes (Signed)
Established Patient Office Visit  Subjective:  Patient ID: Norma Clay, female    DOB: 08/09/1950  Age: 69 y.o. MRN: [id number]  Due to a language barrier, patient was offered Stratus video interpretation system but preferred to have translation done by her son  CC:  Chief Complaint  Patient presents with  . Diabetes    HPI Norma Clay, 69 yo [place] female, who is seen in follow-up of chronic medical issues including type 2 diabetes, hypertension, hypothyroidism and patient has had issues with recurrent UTI with hematuria.  She additionally has hypocalcemia/hypoparathyroidism which is followed by endocrinology.  At today's visit, patient son states that since patient saw urology in July and was told that she has an abdominal hernia, she has complained of having discomfort in her mid upper abdomen.  Patient reports that she has pain in this area as well as occasional nausea and decreased appetite.  She reports that she does not feel well in general.  She is also having pain across her lower abdomen as well as increased mid back pain and urinary frequency with dysuria.  She has not seen any blood in the urine.  Per son, when she had urology work-up this summer, they were told by urology that they cannot find any cause for patient's recurrent hematuria on urinalysis. (On review of chart, urology note from this summer, July 2020, has not yet been scanned into patient's chart.  There is a CT scan report from 07/23/2018 which mentions a tiny hiatal hernia as well as fluid level in the distal esophagus suggestive of dysmotility or reflux.  There is also mention of colonic stool burden suggestive of constipation.  No adnexal masses, prior hysterectomy.  Also with right hip osteoarthritis.)  Patient is currently on pantoprazole.       Son reports that patient's blood sugars remain well controlled and fasting blood sugars are in the low 100s.  Patient denies any significant issues with hypoglycemia.   Patient person on checks her blood sugars at home but does not write down the results.  Patient does not live with the son and therefore he is not aware of how she takes her medications or her actual blood sugar results.  Son reports that patient will need refills of all of her current medications.  Son reports that his mother called him about 4 days ago and told him that she needed to go to the hospital due to her back and abdominal pain however patient states that he did not take her to the hospital as they had the upcoming appointment already scheduled here.  She denies any shortness of breath or cough.  She denies any chest pain but has sensation of palpitations.  Per son, palpitations are described by patient as fast heart rate but then he stated that she feels as if she has several abnormal beats in a row which then returns to normal.  She had not noticed increased peripheral edema but when pointed out to her, she has noticed more swelling in her lower legs and feet.  She denies headaches or dizziness.  Past Medical History:  Diagnosis Date  . Diabetes mellitus without complication ([place])   . Glaucoma   . Hypertension     Past Surgical History:  Procedure Laterality Date  . GLAUCOMA SURGERY    . THYROIDECTOMY      Family History  Problem Relation Age of Onset  . Diabetes Mother     Social History   Socioeconomic History  .  Marital status: Married    Spouse name: Not on file  . Number of children: Not on file  . Years of education: Not on file  . Highest education level: Not on file  Occupational History  . Not on file  Tobacco Use  . Smoking status: Never Smoker  . Smokeless tobacco: Never Used  Substance and Sexual Activity  . Alcohol use: No  . Drug use: No  . Sexual activity: Not Currently  Other Topics Concern  . Not on file  Social History Narrative  . Not on file   Social Determinants of Health   Financial Resource Strain:   . Difficulty of Paying Living  Expenses: Not on file  Food Insecurity:   . Worried About Charity fundraiser in the Last Year: Not on file  . Ran Out of Food in the Last Year: Not on file  Transportation Needs:   . Lack of Transportation (Medical): Not on file  . Lack of Transportation (Non-Medical): Not on file  Physical Activity:   . Days of Exercise per Week: Not on file  . Minutes of Exercise per Session: Not on file  Stress:   . Feeling of Stress : Not on file  Social Connections:   . Frequency of Communication with Friends and Family: Not on file  . Frequency of Social Gatherings with Friends and Family: Not on file  . Attends Religious Services: Not on file  . Active Member of Clubs or Organizations: Not on file  . Attends Archivist Meetings: Not on file  . Marital Status: Not on file  Intimate Partner Violence:   . Fear of Current or Ex-Partner: Not on file  . Emotionally Abused: Not on file  . Physically Abused: Not on file  . Sexually Abused: Not on file    Outpatient Medications Prior to Visit  Medication Sig Dispense Refill  . amLODipine (NORVASC) 5 MG tablet Take 1 tablet (5 mg total) by mouth daily. To lower blood pressure 90 tablet 3  . atorvastatin (LIPITOR) 20 MG tablet Take 1 tablet (20 mg total) by mouth daily. 90 tablet 2  . atropine 1 % ophthalmic solution Place one drop into the right eye 2 (two) times daily for 14 days. Do not take tonight.  Dr. Ander Slade will evaluate your post-operative status tomorrow and may make changes to the duration and dose of this medication.    . Blood Glucose Monitoring Suppl (TRUE METRIX METER) w/Device KIT 1 each by Does not apply route 2 (two) times daily. 1 kit 0  . calcitRIOL (ROCALTROL) 0.25 MCG capsule Take 2 capsules (0.5 mcg total) by mouth daily. 180 capsule 3  . COBALAMINE COMBINATIONS PO Take 500 mcg by mouth. Cobal (Mecobalamin)    . diclofenac Sodium (VOLTAREN) 1 % GEL RUB 2 GRAMS INTO AFFECTED AREA OF FOOT 2 TO 4 TIMES DAILY 100 g 2  .  dorzolamide-timolol (COSOPT) 22.3-6.8 MG/ML ophthalmic solution Place 1 drop into both eyes 2 (two) times daily. 10 mL 0  . glimepiride (AMARYL) 4 MG tablet Take 1 tablet (4 mg total) by mouth daily with breakfast. To lower blood sugar 90 tablet 1  . glucose blood test strip Use as instructed 100 each 12  . levothyroxine (SYNTHROID) 100 MCG tablet 1 pill once daily.  Do not eat for 30 minutes after taking p.o. 90 tablet 1  . losartan (COZAAR) 25 MG tablet Take 1 tablet (25 mg total) by mouth daily. 90 tablet 3  .  metFORMIN (GLUCOPHAGE) 850 MG tablet Take 1 tablet (850 mg total) by mouth 2 (two) times daily with a meal. 180 tablet 1  . moxifloxacin (VIGAMOX) 0.5 % ophthalmic solution Place one drop into the right eye 3 (three) times a day for 21 days. Do not take tonight.  Dr. Ander Slade will evaluate your post-operative status tomorrow and may make changes to the duration and dose of this medication.    Marland Kitchen neomycin-polymyxin-dexameth (MAXITROL) 0.1 % OINT Do not use tonight!   Apply a strip of ointment the size of a grain of rice to RIGHT eye at bedtime. This must be the last eye medicine of the day.   Dr. Ander Slade will evaluate your post-operative status tomorrow and may make changes to the duration and dose of this medication.    . Netarsudil Dimesylate 0.02 % SOLN Apply 1 drop to eye every evening.    [place] Calcium 500 MG TABS Take 2 tablets (2,500 mg total) by mouth 2 (two) times daily with a meal. 120 tablet 11  . pantoprazole (PROTONIX) 40 MG tablet Take 1 tablet (40 mg total) by mouth daily. To decrease stomach acid 90 tablet 3  . prednisoLONE acetate (PRED FORTE) 1 % ophthalmic suspension Apply to eye.    . predniSONE 5 MG/5ML solution Take by mouth daily with breakfast.    . Travoprost, BAK Free, (TRAVATAN Z) 0.004 % SOLN ophthalmic solution Apply 1 drop to eye at bedtime.    . TRUEPLUS LANCETS 28G MISC Use to check blood sugar up to 3 times daily. 100 each 11  . vitamin A 10000 UNIT  capsule Take 10,000 Units by mouth daily.    . metroNIDAZOLE (FLAGYL) 500 MG tablet Take 1 tablet (500 mg total) by mouth 2 (two) times daily. (Patient not taking: Reported on 02/08/2019) 14 tablet 0   No facility-administered medications prior to visit.    Allergies  Allergen Reactions  . Clindamycin/Lincomycin Rash  . Penicillins Rash    Patient was placed on a form of penicillin by her dentist and developed a rash therefore medication was changed to clindamycin.  Per son, patient is not allergic to clindamycin    ROS Review of Systems  Constitutional: Positive for fatigue. Negative for chills and fever.  HENT: Negative for sore throat and trouble swallowing.   Eyes: Positive for visual disturbance (glaucoma; wears glasses). Negative for photophobia.  Respiratory: Negative for cough and shortness of breath.   Cardiovascular: Positive for palpitations. Negative for chest pain and leg swelling.  Gastrointestinal: Positive for abdominal pain, constipation (occasional) and nausea (occasional). Negative for blood in stool and diarrhea.  Endocrine: Positive for polyuria. Negative for polydipsia and polyphagia.  Genitourinary: Positive for dysuria, flank pain and frequency. Negative for hematuria.  Musculoskeletal: Positive for back pain and gait problem.  Neurological: Negative for dizziness and headaches.  Hematological: Negative for adenopathy.  Psychiatric/Behavioral: Negative for self-injury and suicidal ideas. The patient is nervous/anxious.       Objective:    Physical Exam  Constitutional: She is oriented to person, place, and time. She appears well-developed and well-nourished.  WNWD elderly female in NAD sitting on exam table. She appears to not feel well. With walking, she has an abnormal gait and uses a quad cane  Neck: No JVD present.  Cardiovascular: Normal rate and regular rhythm.  Pulmonary/Chest: Effort normal and breath sounds normal.  Abdominal: Soft. There is  abdominal tenderness. There is no rebound and no guarding.  Mild epigastric tenderness to  palp and suprapubic tenderness, left lower quadrant discomfort, no rebound or guarding  Musculoskeletal:        General: Tenderness and edema (bilateral LE and pedal edema with trace pitting on the left distal LE) present.     Cervical back: Normal range of motion and neck supple.     Comments: Bilateral CVA tenderness; lumbosacral tenderness to palp and right SI joint/posterior hip discomfort with palp  Lymphadenopathy:    She has no cervical adenopathy.  Neurological: She is alert and oriented to person, place, and time.  Skin: Skin is warm and dry.  Psychiatric: She has a normal mood and affect. Her behavior is normal.  Slightly flattened affect versus fatigue/malaise  Nursing note and vitals reviewed.   BP 125/79   Pulse 84   Ht 5' 8" (1.727 m)   Wt 179 lb (81.2 kg)   SpO2 98%   BMI 27.22 kg/m  Wt Readings from Last 3 Encounters:  02/08/19 179 lb (81.2 kg)  12/03/18 171 lb 6.4 oz (77.7 kg)  10/31/18 167 lb (75.8 kg)     Health Maintenance Due  Topic Date Due  . MAMMOGRAM  01/04/1999  . COLONOSCOPY  01/04/1999  . FOOT EXAM  04/18/2017  . PNA vac Low Risk Adult (2 of 2 - PPSV23) 04/18/2017  . INFLUENZA VACCINE  08/04/2018     Lab Results  Component Value Date   TSH 0.407 (L) 10/31/2018   Lab Results  Component Value Date   WBC 6.3 10/31/2018   HGB 12.0 10/31/2018   HCT 36.1 10/31/2018   MCV 93 10/31/2018   PLT 232 10/31/2018   Lab Results  Component Value Date   NA 137 12/03/2018   K 3.8 12/03/2018   CO2 27 12/03/2018   GLUCOSE 192 (H) 12/03/2018   BUN 28 (H) 12/03/2018   CREATININE 0.92 12/03/2018   BILITOT 0.3 10/31/2018   ALKPHOS 107 10/31/2018   AST 24 10/31/2018   ALT 14 10/31/2018   PROT 7.1 10/31/2018   ALBUMIN 3.6 12/03/2018   CALCIUM 7.5 (L) 12/03/2018   ANIONGAP 14 10/30/2014   GFR 60.37 12/03/2018   Lab Results  Component Value Date   CHOL 251  (H) 09/14/2017   Lab Results  Component Value Date   HDL 70 09/14/2017   Lab Results  Component Value Date   LDLCALC 164 (H) 09/14/2017   Lab Results  Component Value Date   TRIG 85 09/14/2017   Lab Results  Component Value Date   CHOLHDL 3.6 09/14/2017   Lab Results  Component Value Date   HGBA1C 7.2 (A) 02/08/2019      Assessment & Plan:  1. Diabetes mellitus without complication [facility]) Son reports that patient states that her blood sugars are well controlled and in the low 100s but patient does not write down her blood sugars per son so he is not sure how her blood sugars are truly running.  She will have random glucose level and hemoglobin A1c at today's visit.  She will additionally have comprehensive metabolic panel in follow-up of diabetes.  Her Amaryl dose will be decreased to half a pill once daily to help avoid hypoglycemic episodes as she reports decreased appetite associated with current abdominal pain. - POCT glucose (manual entry) - POCT glycosylated hemoglobin (Hb A1C) - Comprehensive metabolic panel - glimepiride (AMARYL) 4 MG tablet; Decrease to 1/2 pill once per day before first meal of the day  Dispense: 45 tablet; Refill: 0  2. Lower abdominal pain  Patient with complaint of lower abdominal pain.  Patient will have urinalysis to look for urinary tract infection as she has had recurrent urinary tract infections.  CBC to look for elevated white blood cell count or anemia.  Comprehensive metabolic panel to look for electrolyte abnormality or liver abnormality.  Patient has also had constipation which may be a contributing factor.  She is status post hysterectomy but does have remaining ovary but no adnexal abnormality was seen on CT scan in July 2020.  She should go to the emergency department for further evaluation if she has continued or worsening abdominal pain. - POCT URINALYSIS DIP (CLINITEK) - CBC with Differential - Comprehensive metabolic panel  3.  Epigastric pain Patient currently on pantoprazole 40 mg daily and this will be changed to 20 mg twice daily.  Patient with a small hiatal hernia and evidence of possible reflux versus esophageal dysmotility on CT scan done in July 2020.  She will also have blood work to look for antibodies to H. pylori.  CBC to look for anemia.  Follow-up in 2 weeks. - CBC with Differential - H Pylori, IGM, IGG, IGA AB - pantoprazole (PROTONIX) 20 MG tablet; Take 1 tablet (20 mg total) by mouth 2 (two) times daily. To decrease stomach acid  Dispense: 180 tablet; Refill: 3  4. Palpitations She will be referred to cardiology for further evaluation of her palpitations.  She will also have T4 and TSH to see if she may need adjustment of her dose of thyroid medication as hyperthyroidism could contribute to palpitations.  She will additionally have her electrolytes checked as part of comprehensive metabolic panel.  Patient also has had issues with her calcium levels and per review of her endocrinology note, endocrinologist suspects noncompliance with medications. - Ambulatory referral to Cardiology - T4 AND TSH  5. Acute cystitis with hematuria Patient with bilateral CVA tenderness on exam as well as suprapubic discomfort to palpation and she reports dysuria and urinary frequency.  Urinalysis was done at today's visit which was positive for leukocytes and red blood cells.  Urine will be sent for culture and son will be notified if a change in antibiotic therapy is warranted based on the culture results and in the interim, patient will be treated with Levaquin 250 mg daily x5 days.  CMA was asked to try and obtain a new copy of her notes from urology this summer as the notes that were sent by urology have not yet been scanned into the patient's chart.  CT scan ordered by urology on 07/23/2018 reviewed. - Urine Culture - levofloxacin (LEVAQUIN) 250 MG tablet; Take 1 tablet (250 mg total) by mouth daily. For bladder infection   Dispense: 5 tablet; Refill: 0  6. Acquired hypothyroidism She has hypothyroidism and is currently on levothyroxine 100 mcg daily.  Will check T4 and TSH to make sure that she is on an adequate dose and also that there is no hypothyroidism but she complains of constipation and no hyperthyroidism as she has complaint of palpitations. - T4 AND TSH - levothyroxine (SYNTHROID) 100 MCG tablet; 1 pill once daily.  Do not eat for 30 minutes after taking p.o.  Dispense: 90 tablet; Refill: 1  7. Constipation, unspecified constipation type She reports issues with chronic constipation.  Will check TSH.  She should increase water intake and fiber intake and use over-the-counter laxative if needed for current constipation.  She is to follow-up in 2 weeks and will likely have patient additionally start daily MiraLAX  at that time.  8. Hyperlipidemia associated with type 2 diabetes mellitus ([place]) Refill provided of atorvastatin for continued treatment of hyperlipidemia - atorvastatin (LIPITOR) 20 MG tablet; Take 1 tablet (20 mg total) by mouth daily.  Dispense: 90 tablet; Refill: 2  10. Essential hypertension Blood pressure appears well controlled at today's visit.  Refills provided of amlodipine and losartan - amLODipine (NORVASC) 5 MG tablet; Take 1 tablet (5 mg total) by mouth daily. To lower blood pressure  Dispense: 90 tablet; Refill: 3 - losartan (COZAAR) 25 MG tablet; Take 1 tablet (25 mg total) by mouth daily.  Dispense: 90 tablet; Refill: 3  11. Language barrier Patient with language barrier affecting healthcare but declined Stratus video interpretation system in favor of having translation done by her son   An After Visit Summary was printed and given to the patient.  Follow-up: Return in about 2 weeks (around 02/22/2019) for abdominal pain-in 1-2 weeks.    Antony Blackbird, MD

## 2019-02-08 NOTE — Progress Notes (Signed)
Abdominal pain  Rapid heart beat.

## 2019-02-08 NOTE — Patient Instructions (Signed)
Prescription has been sent to pharmacy for Levaquin 250 mg once daily x5 days for treatment of bladder infection.  Please decrease dose of Amaryl, take half a pill (2 mg) once daily before the first meal of the day.  Please go to the emergency department if you are feeling worse.  Referral placed for follow-up of cardiology.  Please schedule follow-up appointment here in the office in 2 weeks, sooner if needed.

## 2019-02-10 LAB — URINE CULTURE: Organism ID, Bacteria: NO GROWTH

## 2019-02-11 ENCOUNTER — Telehealth: Payer: Self-pay | Admitting: Family Medicine

## 2019-02-11 MED FILL — AMLODIPINE BESYLATE 5 MG TA: 5 | 30 days supply | Qty: 30 | Fill #3

## 2019-02-11 MED FILL — LEVOTHYROXINE SODIUM 100 MC: 100 | 30 days supply | Qty: 30 | Fill #3

## 2019-02-11 NOTE — Telephone Encounter (Signed)
Patient returned nurse call. Please f/u.  

## 2019-02-12 ENCOUNTER — Other Ambulatory Visit: Payer: Self-pay | Admitting: Family Medicine

## 2019-02-12 DIAGNOSIS — R768 Other specified abnormal immunological findings in serum: Secondary | ICD-10-CM

## 2019-02-12 DIAGNOSIS — R7689 Other specified abnormal immunological findings in serum: Secondary | ICD-10-CM

## 2019-02-12 DIAGNOSIS — R110 Nausea: Secondary | ICD-10-CM

## 2019-02-12 LAB — COMPREHENSIVE METABOLIC PANEL WITH GFR
ALT: 12 IU/L (ref 0–32)
AST: 25 IU/L (ref 0–40)
Albumin/Globulin Ratio: 1.2 (ref 1.2–2.2)
Albumin: 4.2 g/dL (ref 3.8–4.8)
Alkaline Phosphatase: 104 IU/L (ref 39–117)
BUN/Creatinine Ratio: 26 (ref 12–28)
BUN: 26 mg/dL (ref 8–27)
Bilirubin Total: 0.4 mg/dL (ref 0.0–1.2)
CO2: 23 mmol/L (ref 20–29)
Calcium: 7.4 mg/dL — ABNORMAL LOW (ref 8.7–10.3)
Chloride: 100 mmol/L (ref 96–106)
Creatinine, Ser: 1 mg/dL (ref 0.57–1.00)
GFR calc Af Amer: 66 mL/min/1.73
GFR calc non Af Amer: 57 mL/min/1.73 — ABNORMAL LOW
Globulin, Total: 3.5 g/dL (ref 1.5–4.5)
Glucose: 115 mg/dL — ABNORMAL HIGH (ref 65–99)
Potassium: 4.4 mmol/L (ref 3.5–5.2)
Sodium: 140 mmol/L (ref 134–144)
Total Protein: 7.7 g/dL (ref 6.0–8.5)

## 2019-02-12 LAB — CBC WITH DIFFERENTIAL/PLATELET
Basophils Absolute: 0 x10E3/uL (ref 0.0–0.2)
Basos: 1 %
EOS (ABSOLUTE): 0.1 x10E3/uL (ref 0.0–0.4)
Eos: 2 %
Hematocrit: 37 % (ref 34.0–46.6)
Hemoglobin: 12.5 g/dL (ref 11.1–15.9)
Immature Grans (Abs): 0 x10E3/uL (ref 0.0–0.1)
Immature Granulocytes: 0 %
Lymphocytes Absolute: 1.4 x10E3/uL (ref 0.7–3.1)
Lymphs: 19 %
MCH: 31.2 pg (ref 26.6–33.0)
MCHC: 33.8 g/dL (ref 31.5–35.7)
MCV: 92 fL (ref 79–97)
Monocytes Absolute: 0.7 x10E3/uL (ref 0.1–0.9)
Monocytes: 9 %
Neutrophils Absolute: 5.1 x10E3/uL (ref 1.4–7.0)
Neutrophils: 69 %
Platelets: 219 x10E3/uL (ref 150–450)
RBC: 4.01 x10E6/uL (ref 3.77–5.28)
RDW: 13.9 % (ref 11.7–15.4)
WBC: 7.4 x10E3/uL (ref 3.4–10.8)

## 2019-02-12 LAB — T4 AND TSH
T4, Total: 9.9 ug/dL (ref 4.5–12.0)
TSH: 1.09 u[IU]/mL (ref 0.450–4.500)

## 2019-02-12 LAB — H PYLORI, IGM, IGG, IGA AB
H pylori, IgM Abs: <9 U (ref 0.0–8.9)
H. pylori, IgA Abs: >35 U — ABNORMAL HIGH (ref 0.0–8.9)
H. pylori, IgG AbS: 1.47 {index_val} — ABNORMAL HIGH (ref 0.00–0.79)

## 2019-02-12 MED ORDER — METRONIDAZOLE 500 MG PO TABS: 500.0000 mg | ORAL_TABLET | Freq: Two times a day (BID) | ORAL | 0 refills | Status: AC

## 2019-02-12 MED ORDER — CLARITHROMYCIN 500 MG PO TABS: 500.0000 mg | ORAL_TABLET | Freq: Two times a day (BID) | ORAL | 0 refills | Status: DC

## 2019-02-12 MED ORDER — OMEPRAZOLE 40 MG PO CPDR: 40.0000 mg | DELAYED_RELEASE_CAPSULE | Freq: Two times a day (BID) | ORAL | 0 refills | Status: DC

## 2019-02-12 MED ORDER — ONDANSETRON HCL 4 MG PO TABS: 4.0000 mg | ORAL_TABLET | Freq: Three times a day (TID) | ORAL | 0 refills | Status: DC | PRN

## 2019-02-12 MED FILL — TRAVATAN Z 0.004% EYE DROP: 0.004 | 18 days supply | Qty: 3 | Fill #6

## 2019-02-12 MED FILL — GLIMEPIRIDE 4 MG TABS: 4 | 30 days supply | Qty: 15 | Fill #0

## 2019-02-12 MED FILL — ONDANSETRON HCL 4 MG TABLET: 4 | 6 days supply | Qty: 20 | Fill #0

## 2019-02-12 MED FILL — CLARITHROMYCIN 500 MG TAB: 500 | 10 days supply | Qty: 20 | Fill #0

## 2019-02-12 MED FILL — ?METRONIDAZOLE 500 MG TABS: 500 | 10 days supply | Qty: 20 | Fill #0

## 2019-02-12 MED FILL — LOSARTAN POTASSIUM 25 MG TA: 25 | 90 days supply | Qty: 90 | Fill #2

## 2019-02-12 MED FILL — ?ATORVASTATIN 20 MG TABLET: 20 | 30 days supply | Qty: 30 | Fill #1

## 2019-02-12 MED FILL — CALCITRIOL 0.25 MCG CAPS: 0.25 | 30 days supply | Qty: 60 | Fill #1

## 2019-02-12 MED FILL — DORZOLAMIDE-TIMOLOL EYE DRP: 22.3-6.8 | 40 days supply | Qty: 10 | Fill #5

## 2019-02-12 NOTE — Progress Notes (Signed)
Patient ID: Norma Clay, female   DOB: 08-13-1950, 69 y.o.   MRN: [id number]   Patient with recent normal H. pylori antibody test.  Prescriptions have been sent to pharmacy for omeprazole, Biaxin and metronidazole as patient is allergic to penicillin.  As patient is elderly and Biaxin and metronidazole can cause nausea, prescription was also sent to her pharmacy for Zofran.  Omeprazole Rx needs to be resent due to length of note to pharmacy as the prescription was printed instead of going to pharmacy electronically.

## 2019-02-13 MED FILL — OMEPRAZOLE DR 40 MG CAPSULE: 40 | 30 days supply | Qty: 60 | Fill #0

## 2019-02-18 ENCOUNTER — Ambulatory Visit: Payer: Self-pay | Attending: Family Medicine

## 2019-02-18 ENCOUNTER — Other Ambulatory Visit: Payer: Self-pay

## 2019-02-19 ENCOUNTER — Ambulatory Visit (INDEPENDENT_AMBULATORY_CARE_PROVIDER_SITE_OTHER): Payer: Self-pay | Admitting: Cardiovascular Disease

## 2019-02-19 ENCOUNTER — Encounter: Payer: Self-pay | Admitting: Cardiovascular Disease

## 2019-02-19 ENCOUNTER — Other Ambulatory Visit: Payer: Self-pay

## 2019-02-19 ENCOUNTER — Telehealth: Payer: Self-pay | Admitting: Radiology

## 2019-02-19 VITALS — BP 126/62 | HR 97 | Ht 68.0 in | Wt 182.0 lb

## 2019-02-19 DIAGNOSIS — I10 Essential (primary) hypertension: Secondary | ICD-10-CM

## 2019-02-19 DIAGNOSIS — E785 Hyperlipidemia, unspecified: Secondary | ICD-10-CM | POA: Insufficient documentation

## 2019-02-19 DIAGNOSIS — E782 Mixed hyperlipidemia: Secondary | ICD-10-CM

## 2019-02-19 DIAGNOSIS — I739 Peripheral vascular disease, unspecified: Secondary | ICD-10-CM

## 2019-02-19 DIAGNOSIS — R0602 Shortness of breath: Secondary | ICD-10-CM

## 2019-02-19 DIAGNOSIS — R002 Palpitations: Secondary | ICD-10-CM | POA: Insufficient documentation

## 2019-02-19 NOTE — Assessment & Plan Note (Signed)
History of hyperlipidemia on statin therapy with a lipid profile performed 09/14/2017 revealing a total cholesterol of 251, LDL 164 and HDL of 70.  She will need a repeat lipid and liver profile.

## 2019-02-19 NOTE — Patient Instructions (Addendum)
Medication Instructions:  Your physician recommends that you continue on your current medications as directed. Please refer to the Current Medication list given to you today.  If you need a refill on your cardiac medications before your next appointment, please call your pharmacy.   Lab work: Fasting Lipids and Hepatic Function  Testing/Procedures: Your physician has requested that you have an echocardiogram. Echocardiography is a painless test that uses sound waves to create images of your heart. It provides your doctor with information about the size and shape of your heart and how well your heart's chambers and valves are working. This procedure takes approximately one hour. There are no restrictions for this procedure. [place] [address]  AND  2 Week Zio Patch  Follow-Up: At [facility], you and your health needs are our priority.  As part of our continuing mission to provide you with exceptional heart care, we have created designated Provider Care Teams.  These Care Teams include your primary Cardiologist (physician) and Advanced Practice Providers (APPs -  Physician Assistants and Nurse Practitioners) who all work together to provide you with the care you need, when you need it. You may see Dr. Gwenlyn Found or one of the following Advanced Practice Providers on your designated Care Team:    Kerin Ransom, PA-C  [place], [place]  Coletta Memos, [place]  Your physician wants you to follow-up in: 4-6 weeks with Dr. Gwenlyn Found.   Any Other Special Instructions Will Be Listed Below (If Applicable).  ZIO XT- Long Term Monitor Instructions   Your physician has requested you wear your ZIO patch monitor 14 days.   This is a single patch monitor.  [employer] supplies one patch monitor per enrollment.  Additional stickers are not available.   Please do not apply patch if you will be having a Nuclear Stress Test, Echocardiogram, Cardiac CT, MRI, or Chest Xray during the time frame you  would be wearing the monitor. The patch cannot be worn during these tests.  You cannot remove and re-apply the ZIO XT patch monitor.   Your ZIO patch monitor will be sent USPS Priority mail from [facility] directly to your home address. The monitor may also be mailed to a PO BOX if home delivery is not available.   It may take 3-5 days to receive your monitor after you have been enrolled.   Once you have received you monitor, please review enclosed instructions.  Your monitor has already been registered assigning a specific monitor serial # to you.   Applying the monitor   Shave hair from upper left chest.   Hold abrader disc by orange tab.  Rub abrader in 40 strokes over left upper chest as indicated in your monitor instructions.   Clean area with 4 enclosed alcohol pads .  Use all pads to assure are is cleaned thoroughly.  Let dry.   Apply patch as indicated in monitor instructions.  Patch will be place under collarbone on left side of chest with arrow pointing upward.   Rub patch adhesive wings for 2 minutes.Remove white label marked "1".  Remove white label marked "2".  Rub patch adhesive wings for 2 additional minutes.   While looking in a mirror, press and release button in center of patch.  A small green light will flash 3-4 times .  This will be your only indicator the monitor has been turned on.     Do not shower for the first 24 hours.  You may shower after  the first 24 hours.   Press button if you feel a symptom. You will hear a small click.  Record Date, Time and Symptom in the Patient Log Book.   When you are ready to remove patch, follow instructions on last 2 pages of Patient Log Book.  Stick patch monitor onto last page of Patient Log Book.   Place Patient Log Book in [place] box.  Use locking tab on box and tape box closed securely.  The [employer] and [employer] has [employer] on it.  Please place in mailbox as soon as possible.  Your physician should have your test  results approximately 7 days after the monitor has been mailed back to [facility].   Call [facility] Customer Care at [phone] if you have questions regarding your ZIO XT patch monitor.  Call them immediately if you see an orange light blinking on your monitor.   If your monitor falls off in less than 4 days contact our Monitor department at [phone].  If your monitor becomes loose or falls off after 4 days call [employer] at [phone] for suggestions on securing your monitor.

## 2019-02-19 NOTE — Assessment & Plan Note (Signed)
History of essential hypertension with blood pressure measured today 126/72.  She is on amlodipine.

## 2019-02-19 NOTE — Assessment & Plan Note (Signed)
History of shortness of breath both at rest and with exertion.  We will check a 2D echo to further evaluate

## 2019-02-19 NOTE — Progress Notes (Signed)
02/19/2019 Norma Clay   1950-05-20  [id number]  Primary Physician Antony Blackbird, MD Primary Cardiologist: Lorretta Harp MD Lupe Carney, [place]  HPI:  Norma Clay is a 69 y.o. moderately overweight married [place] female mother of 72, grandmother of 22 grandchildren referred by Dr. Chapman Fitch for cardiovascular valuation because of palpitations.  She moved from [place] to [place] 4 years ago.  Her cardiac risk factor profile is notable for treated hypertension, diabetes and hyperlipidemia.  There is no family history of heart disease.  She is never had a heart attack or stroke.  She does get occasional shortness of breath and atypical chest pain.  She is noticed palpitations of recent onset that occur mostly with exertion.   Current Meds  Medication Sig  . amLODipine (NORVASC) 5 MG tablet Take 1 tablet (5 mg total) by mouth daily. To lower blood pressure  . atorvastatin (LIPITOR) 20 MG tablet Take 1 tablet (20 mg total) by mouth daily.  Marland Kitchen atropine 1 % ophthalmic solution Place one drop into the right eye 2 (two) times daily for 14 days. Do not take tonight.  Dr. Ander Slade will evaluate your post-operative status tomorrow and may make changes to the duration and dose of this medication.  . Blood Glucose Monitoring Suppl (TRUE METRIX METER) w/Device KIT 1 each by Does not apply route 2 (two) times daily.  . calcitRIOL (ROCALTROL) 0.25 MCG capsule Take 2 capsules (0.5 mcg total) by mouth daily.  . clarithromycin (BIAXIN) 500 MG tablet Take 1 tablet (500 mg total) by mouth 2 (two) times daily.  . COBALAMINE COMBINATIONS PO Take 500 mcg by mouth. Cobal (Mecobalamin)  . diclofenac Sodium (VOLTAREN) 1 % GEL RUB 2 GRAMS INTO AFFECTED AREA OF FOOT 2 TO 4 TIMES DAILY  . dorzolamide-timolol (COSOPT) 22.3-6.8 MG/ML ophthalmic solution Place 1 drop into both eyes 2 (two) times daily.  Marland Kitchen glimepiride (AMARYL) 4 MG tablet Decrease to 1/2 pill once per day before first meal of the day  . glucose blood  test strip Use as instructed  . levofloxacin (LEVAQUIN) 250 MG tablet Take 1 tablet (250 mg total) by mouth daily. For bladder infection  . levothyroxine (SYNTHROID) 100 MCG tablet 1 pill once daily.  Do not eat for 30 minutes after taking p.o.  . losartan (COZAAR) 25 MG tablet Take 1 tablet (25 mg total) by mouth daily.  . metFORMIN (GLUCOPHAGE) 850 MG tablet Take 1 tablet (850 mg total) by mouth 2 (two) times daily with a meal.  . metroNIDAZOLE (FLAGYL) 500 MG tablet Take 1 tablet (500 mg total) by mouth 2 (two) times daily for 10 days.  Marland Kitchen moxifloxacin (VIGAMOX) 0.5 % ophthalmic solution Place one drop into the right eye 3 (three) times a day for 21 days. Do not take tonight.  Dr. Ander Slade will evaluate your post-operative status tomorrow and may make changes to the duration and dose of this medication.  Marland Kitchen neomycin-polymyxin-dexameth (MAXITROL) 0.1 % OINT Do not use tonight!   Apply a strip of ointment the size of a grain of rice to RIGHT eye at bedtime. This must be the last eye medicine of the day.   Dr. Ander Slade will evaluate your post-operative status tomorrow and may make changes to the duration and dose of this medication.  . Netarsudil Dimesylate 0.02 % SOLN Apply 1 drop to eye every evening.  Marland Kitchen omeprazole (PRILOSEC) 40 MG capsule Take 1 capsule (40 mg total) by mouth 2 (two) times daily.  . ondansetron (  ZOFRAN) 4 MG tablet Take 1 tablet (4 mg total) by mouth every 8 (eight) hours as needed for nausea or vomiting.  Loma [place] Calcium 500 MG TABS Take 2 tablets (2,500 mg total) by mouth 2 (two) times daily with a meal.  . pantoprazole (PROTONIX) 20 MG tablet Take 1 tablet (20 mg total) by mouth 2 (two) times daily. To decrease stomach acid  . prednisoLONE acetate (PRED FORTE) 1 % ophthalmic suspension Apply to eye.  . predniSONE 5 MG/5ML solution Take by mouth daily with breakfast.  . Travoprost, BAK Free, (TRAVATAN Z) 0.004 % SOLN ophthalmic solution Apply 1 drop to eye at bedtime.  .  TRUEPLUS LANCETS 28G MISC Use to check blood sugar up to 3 times daily.  . vitamin A 10000 UNIT capsule Take 10,000 Units by mouth daily.     Allergies  Allergen Reactions  . Clindamycin/Lincomycin Rash  . Penicillins Rash    Patient was placed on a form of penicillin by her dentist and developed a rash therefore medication was changed to clindamycin.  Per son, patient is not allergic to clindamycin    Social History   Socioeconomic History  . Marital status: Married    Spouse name: Not on file  . Number of children: Not on file  . Years of education: Not on file  . Highest education level: Not on file  Occupational History  . Not on file  Tobacco Use  . Smoking status: Never Smoker  . Smokeless tobacco: Never Used  Substance and Sexual Activity  . Alcohol use: No  . Drug use: No  . Sexual activity: Not Currently  Other Topics Concern  . Not on file  Social History Narrative  . Not on file   Social Determinants of Health   Financial Resource Strain:   . Difficulty of Paying Living Expenses: Not on file  Food Insecurity:   . Worried About Charity fundraiser in the Last Year: Not on file  . Ran Out of Food in the Last Year: Not on file  Transportation Needs:   . Lack of Transportation (Medical): Not on file  . Lack of Transportation (Non-Medical): Not on file  Physical Activity:   . Days of Exercise per Week: Not on file  . Minutes of Exercise per Session: Not on file  Stress:   . Feeling of Stress : Not on file  Social Connections:   . Frequency of Communication with Friends and Family: Not on file  . Frequency of Social Gatherings with Friends and Family: Not on file  . Attends Religious Services: Not on file  . Active Member of Clubs or Organizations: Not on file  . Attends Archivist Meetings: Not on file  . Marital Status: Not on file  Intimate Partner Violence:   . Fear of Current or Ex-Partner: Not on file  . Emotionally Abused: Not on file  .  Physically Abused: Not on file  . Sexually Abused: Not on file     Review of Systems: General: negative for chills, fever, night sweats or weight changes.  Cardiovascular: negative for chest pain, dyspnea on exertion, edema, orthopnea, palpitations, paroxysmal nocturnal dyspnea or shortness of breath Dermatological: negative for rash Respiratory: negative for cough or wheezing Urologic: negative for hematuria Abdominal: negative for nausea, vomiting, diarrhea, bright red blood per rectum, melena, or hematemesis Neurologic: negative for visual changes, syncope, or dizziness All other systems reviewed and are otherwise negative except as noted above.  Blood pressure 126/62, pulse 97, height '5\' 8"'  (1.727 m), weight 182 lb (82.6 kg).  General appearance: alert and no distress Neck: no adenopathy, no carotid bruit, no JVD, supple, symmetrical, trachea midline and thyroid not enlarged, symmetric, no tenderness/mass/nodules Lungs: clear to auscultation bilaterally Heart: regular rate and rhythm, S1, S2 normal, no murmur, click, rub or gallop Extremities: extremities normal, atraumatic, no cyanosis or edema Pulses: 2+ and symmetric Skin: Skin color, texture, turgor normal. No rashes or lesions Neurologic: Alert and oriented X 3, normal strength and tone. Normal symmetric reflexes. Normal coordination and gait  EKG sinus rhythm at 97 without ST or T wave changes.  I personally reviewed this EKG.  ASSESSMENT AND PLAN:   Shortness of breath History of shortness of breath both at rest and with exertion.  We will check a 2D echo to further evaluate  Palpitations Patient was referred for palpitations.  They are fairly recent onset.  They occur mostly with exertion.  We will check a 2-week Zio patch to further evaluate  Hyperlipidemia History of hyperlipidemia on statin therapy with a lipid profile performed 09/14/2017 revealing a total cholesterol of 251, LDL 164 and HDL of 70.  She will need  a repeat lipid and liver profile.  Essential hypertension History of essential hypertension with blood pressure measured today 126/72.  She is on amlodipine.      Lorretta Harp MD FACP,FACC,FAHA, [facility] 02/19/2019 10:31 AM

## 2019-02-19 NOTE — Telephone Encounter (Signed)
Enrolled patient for a 14 day Zio monitor to be mailed to patients home.  

## 2019-02-19 NOTE — Assessment & Plan Note (Signed)
Patient was referred for palpitations.  They are fairly recent onset.  They occur mostly with exertion.  We will check a 2-week Zio patch to further evaluate

## 2019-02-19 NOTE — Addendum Note (Signed)
Addended by: Reola Mosher on: 02/19/2019 10:49 AM   Modules accepted: Orders

## 2019-02-23 ENCOUNTER — Ambulatory Visit: Payer: Self-pay | Attending: Internal Medicine

## 2019-02-23 DIAGNOSIS — Z23 Encounter for immunization: Secondary | ICD-10-CM | POA: Insufficient documentation

## 2019-02-23 NOTE — Progress Notes (Signed)
   Covid-19 Vaccination Clinic  Name:  Zennie Ayars    MRN: [id number] DOB: [birth year]  02/23/2019  Ms. Hargens was observed post Covid-19 immunization for 15 minutes without incidence. She was provided with Vaccine Information Sheet and instruction to access the V-Safe system.   Ms. Fawley was instructed to call 911 with any severe reactions post vaccine: Marland Kitchen Difficulty breathing  . Swelling of your face and throat  . A fast heartbeat  . A bad rash all over your body  . Dizziness and weakness    Immunizations Administered    Name Date Dose VIS Date Route   Pfizer COVID-19 Vaccine 02/23/2019 11:15 AM 0.3 mL 12/14/2018 Intramuscular   Manufacturer: [employer], [employer]   Lot: [id number]   NDC: [address]-4

## 2019-02-27 MED FILL — TRAVATAN Z 0.004 % SOLN: 0.004 | 23 days supply | Qty: 3 | Fill #0

## 2019-03-01 ENCOUNTER — Other Ambulatory Visit: Payer: Self-pay

## 2019-03-01 ENCOUNTER — Ambulatory Visit: Payer: Self-pay | Attending: Family Medicine | Admitting: Family Medicine

## 2019-03-01 ENCOUNTER — Encounter: Payer: Self-pay | Admitting: Family Medicine

## 2019-03-01 ENCOUNTER — Encounter: Payer: Self-pay | Admitting: Gastroenterology

## 2019-03-01 VITALS — BP 136/79 | HR 93 | Temp 97.0°F | Resp 18 | Ht 68.0 in | Wt 177.0 lb

## 2019-03-01 DIAGNOSIS — R1013 Epigastric pain: Secondary | ICD-10-CM

## 2019-03-01 DIAGNOSIS — Z789 Other specified health status: Secondary | ICD-10-CM

## 2019-03-01 DIAGNOSIS — R350 Frequency of micturition: Secondary | ICD-10-CM

## 2019-03-01 DIAGNOSIS — K5900 Constipation, unspecified: Secondary | ICD-10-CM

## 2019-03-01 DIAGNOSIS — R600 Localized edema: Secondary | ICD-10-CM

## 2019-03-01 DIAGNOSIS — Z758 Other problems related to medical facilities and other health care: Secondary | ICD-10-CM

## 2019-03-01 DIAGNOSIS — Z603 Acculturation difficulty: Secondary | ICD-10-CM

## 2019-03-01 DIAGNOSIS — R609 Edema, unspecified: Secondary | ICD-10-CM

## 2019-03-01 DIAGNOSIS — E119 Type 2 diabetes mellitus without complications: Secondary | ICD-10-CM

## 2019-03-01 LAB — POCT URINALYSIS DIP (CLINITEK)
Bilirubin, UA: NEGATIVE
Glucose, UA: 500 mg/dL — AB
Ketones, POC UA: NEGATIVE mg/dL
Nitrite, UA: NEGATIVE
POC PROTEIN,UA: NEGATIVE
Spec Grav, UA: 1.015
Urobilinogen, UA: 0.2 U/dL
pH, UA: 7

## 2019-03-01 LAB — GLUCOSE, POCT (MANUAL RESULT ENTRY): POC Glucose: 288 mg/dL — AB (ref 70–99)

## 2019-03-01 MED ORDER — DOCUSATE SODIUM 100 MG PO CAPS: ORAL_CAPSULE | ORAL | 3 refills | Status: DC

## 2019-03-01 MED ORDER — NYSTATIN 100000 UNIT/GM EX CREA: 1.0000 "application " | TOPICAL_CREAM | Freq: Two times a day (BID) | CUTANEOUS | 2 refills | Status: DC

## 2019-03-01 MED FILL — NYSTATIN 100,000 UNIT/GM CR: 100000 | 10 days supply | Qty: 30 | Fill #0

## 2019-03-01 NOTE — Patient Instructions (Signed)
Follow-up with endocrinology regarding low calcium levels. May use over the counter generic Cerave, Eucerin or Aveeno for dry skin areas

## 2019-03-01 NOTE — Progress Notes (Signed)
Subjective:  Patient ID: Norma Clay, female    DOB: 1950/11/14  Age: 69 y.o. MRN: [id number]  CC: Follow-up (DM/UTI) follow-up of abdominal pain from last visit- C. Ryane Konieczny, MD  Due to language barrier, [employer] video interpretation system was offered at today's visit which was declined by patient and her son.  HPI Norma Clay, 69 year old [place] female, who is seen in follow-up from her last visit at which time patient had complaint of sometimes severe upper abdominal pain, decreased appetite, CVA tenderness and lower abdominal discomfort with urinary frequency and dysuria.  She was placed on Levaquin 250 mg daily for treatment of bladder infection and blood work revealed H. pylori infection which has been treated with antibiotic therapy.  At today's visit, she reports that she feels much better regarding the mid upper abdominal discomfort and loss of appetite.  She continues to feel fatigued and still has some mid back pain and frequent urination.  She also continues to feel as if she is constipated and passes stool which is hard and small.  She denies any blood in the stool and has had no black stools.  She denies any pain with defecation.  No blood on the tissue or in the toilet after bowel movement.  The swelling in her lower legs/feet has improved from her last visit.  Son states that the swelling also has better if his mother can be convinced to elevate her legs during the day but she does not like to do so.  She denies any chest pain/substernal discomfort since her last visit however occasionally feels as if she has palpitations-she reports that these are mild.         After giving sample at today's visit for urinalysis, patient with complaint of some discomfort in the vaginal area and noticed some slight redness on the toilet paper when wiping afterwards.  She denies any prior issues with bleeding in the vaginal area.  She has had some slight discomfort/itchiness in the vaginal area since her  last visit.  Past Medical History:  Diagnosis Date  . Diabetes mellitus without complication ([place])   . Glaucoma   . Hypertension     Past Surgical History:  Procedure Laterality Date  . GLAUCOMA SURGERY    . THYROIDECTOMY      Family History  Problem Relation Age of Onset  . Diabetes Mother     Social History   Tobacco Use  . Smoking status: Never Smoker  . Smokeless tobacco: Never Used  Substance Use Topics  . Alcohol use: No    ROS Review of Systems  Constitutional: Positive for fatigue (Improved). Negative for chills and fever.  HENT: Negative for sore throat and trouble swallowing.   Eyes: Positive for visual disturbance (Wears glasses and being treated for glaucoma). Negative for photophobia.  Respiratory: Negative for cough and shortness of breath.   Cardiovascular: Positive for palpitations (Occasional, mild). Negative for chest pain and leg swelling.  Gastrointestinal: Positive for abdominal pain (Improved from last visit, only occurs if she touches the upper abdominal area). Negative for blood in stool, constipation, diarrhea and nausea.  Endocrine: Positive for polyuria. Negative for polydipsia and polyphagia.  Genitourinary: Positive for flank pain and frequency. Negative for dysuria.  Musculoskeletal: Positive for back pain (Tenderness in mid upper back, improved from last visit). Negative for arthralgias.  Skin: Negative for rash and wound.  Neurological: Negative for dizziness and headaches.  Hematological: Negative for adenopathy. Does not bruise/bleed easily.    Objective:  Today's Vitals: BP 136/79 (BP Location: Left Arm, Patient Position: Sitting, Cuff Size: Normal)   Pulse 93   Temp (!) 97 F (36.1 C) (Oral)   Resp 18   Ht _0  (1.727 m)   Wt 177 lb (80.3 kg)   SpO2 99%   BMI 26.91 kg/m   Physical Exam Vitals and nursing note reviewed.  Constitutional:      General: She is not in acute distress.    Appearance: Normal appearance. She  is not ill-appearing.     Comments: Well-nourished well-developed older female in no acute distress sitting on exam table.  She is wearing a mask as per office COVID-19 precautions.  She is accompanied by her adult son at today's visit.  Cardiovascular:     Rate and Rhythm: Normal rate and regular rhythm.     Comments: Mild bilateral distal lower extremity/pedal edema which is nonpitting and poorly palpable pedal pulses (patient has had prior vascular evaluation and no issues were found with lower extremity blood flow) Pulmonary:     Effort: Pulmonary effort is normal.     Breath sounds: Normal breath sounds.  Abdominal:     Palpations: Abdomen is soft.     Tenderness: There is abdominal tenderness (epigastric tenderness to palp). There is right CVA tenderness and left CVA tenderness. There is no guarding or rebound.  Musculoskeletal:     Cervical back: Neck supple. No tenderness.     Right lower leg: Edema present.     Left lower leg: Edema present.     Comments: Mild bilateral non-pitting distal LE edema; mild pedal edema  Lymphadenopathy:     Cervical: No cervical adenopathy.  Skin:    General: Skin is warm and dry.  Neurological:     General: No focal deficit present.     Mental Status: She is alert and oriented to person, place, and time.  Psychiatric:        Mood and Affect: Mood normal.        Behavior: Behavior normal.     Assessment & Plan:  1. Diabetes mellitus without complication ([place]) Her most recent hemoglobin A1c on 02/08/2019 was 7.2 showing good control of her blood sugars.  Patient however with elevated blood sugar today's visit of 288 postprandial.  This is likely contributing to her issues with urinary frequency but will also have patient obtain urinalysis that she also had urinary tract infection at her last visit and will make sure that this has cleared up.  Patient also with complaint of some vaginal area discomfort and she has recently been treated with antibiotic  therapy for H. pylori.  Discussed with patient there is sound that she likely has candidal vaginitis and prescription provided for nystatin but that office should be contacted if her symptoms continue.  She also needs to monitor her blood sugars more frequently and make sure that they are not remaining elevated.  If blood sugars are staying greater than 140 fasting or higher than 180-200 after meals then she likely needs further adjustment in her medications to improve her control of her glucose.  She may also need lipase and further imaging to determine if there are any issues with the pancreas as related to her epigastric pain and now with elevated blood sugars. - Glucose (CBG) - POCT URINALYSIS DIP (CLINITEK) - nystatin cream (MYCOSTATIN); Apply 1 application topically 2 (two) times daily. X 10 days then as needed  Dispense: 30 g; Refill: 2  2. Epigastric pain Patient  with epigastric pain with palpation but has had improvement and her constant epigastric pain that was present at her prior visit.  She is status post treatment for positive H. pylori antibody blood test.  While her symptoms have improved, she does continue to have pain with palpation and likely needs evaluation via EGD. - Ambulatory referral to Gastroenterology  3. Constipation, unspecified constipation type Patient with recent onset of issues with constipation.  Continue increase water and increase fiber in the diet.  Prescription for Colace to help with hard stools and referral to GI for further evaluation. - Ambulatory referral to Gastroenterology - docusate sodium (COLACE) 100 MG capsule; One or two at bedtime to help with hard stools  Dispense: 60 capsule; Refill: 3  4. Urinary frequency She is status post treatment for urinary tract infection at her last visit but continues to complain of urinary frequency.  She additionally also has diabetes and increased monitoring was encouraged to make sure that she is not having  hypoglycemia as a contributing factor.  Patient's urine will be sent for culture and she will be notified if additional antibiotic therapy is needed based on the culture results.  Also may need additional follow-up with urology due to her recurrent urinary tract infections.  Her current constipation could also be contributing to her bladder infections via pressure against the bladder and incomplete emptying due to her constipation. - POCT URINALYSIS DIP (CLINITEK) - Urine Culture  5.  Peripheral edema Patient has had improvement in bilateral distal lower extremity and pedal edema since her last visit.  Patient son reports that her swelling further improves when he is able to convince her to keep her feet elevated but patient does not like to do so.  6.  Language barrier Patient is non-English-speaking and patient and son did not wish to use video interpretation system.  Son acts as interpreter for patient at her medical visits.    Outpatient Encounter Medications as of 03/01/2019  Medication Sig  . amLODipine (NORVASC) 5 MG tablet Take 1 tablet (5 mg total) by mouth daily. To lower blood pressure  . atorvastatin (LIPITOR) 20 MG tablet Take 1 tablet (20 mg total) by mouth daily.  Marland Kitchen atropine 1 % ophthalmic solution Place one drop into the right eye 2 (two) times daily for 14 days. Do not take tonight.  Dr. Ander Slade will evaluate your post-operative status tomorrow and may make changes to the duration and dose of this medication.  . Blood Glucose Monitoring Suppl (TRUE METRIX METER) w/Device KIT 1 each by Does not apply route 2 (two) times daily.  . calcitRIOL (ROCALTROL) 0.25 MCG capsule Take 2 capsules (0.5 mcg total) by mouth daily.  . clarithromycin (BIAXIN) 500 MG tablet Take 1 tablet (500 mg total) by mouth 2 (two) times daily.  . COBALAMINE COMBINATIONS PO Take 500 mcg by mouth. Cobal (Mecobalamin)  . diclofenac Sodium (VOLTAREN) 1 % GEL RUB 2 GRAMS INTO AFFECTED AREA OF FOOT 2 TO 4 TIMES DAILY    . dorzolamide-timolol (COSOPT) 22.3-6.8 MG/ML ophthalmic solution Place 1 drop into both eyes 2 (two) times daily.  Marland Kitchen glimepiride (AMARYL) 4 MG tablet Decrease to 1/2 pill once per day before first meal of the day  . glucose blood test strip Use as instructed  . levofloxacin (LEVAQUIN) 250 MG tablet Take 1 tablet (250 mg total) by mouth daily. For bladder infection  . levothyroxine (SYNTHROID) 100 MCG tablet 1 pill once daily.  Do not eat for 30 minutes after taking  p.o.  . losartan (COZAAR) 25 MG tablet Take 1 tablet (25 mg total) by mouth daily.  . metFORMIN (GLUCOPHAGE) 850 MG tablet Take 1 tablet (850 mg total) by mouth 2 (two) times daily with a meal.  . moxifloxacin (VIGAMOX) 0.5 % ophthalmic solution Place one drop into the right eye 3 (three) times a day for 21 days. Do not take tonight.  Dr. Ander Slade will evaluate your post-operative status tomorrow and may make changes to the duration and dose of this medication.  Marland Kitchen neomycin-polymyxin-dexameth (MAXITROL) 0.1 % OINT Do not use tonight!   Apply a strip of ointment the size of a grain of rice to RIGHT eye at bedtime. This must be the last eye medicine of the day.   Dr. Ander Slade will evaluate your post-operative status tomorrow and may make changes to the duration and dose of this medication.  . Netarsudil Dimesylate 0.02 % SOLN Apply 1 drop to eye every evening.  Marland Kitchen omeprazole (PRILOSEC) 40 MG capsule Take 1 capsule (40 mg total) by mouth 2 (two) times daily.  . ondansetron (ZOFRAN) 4 MG tablet Take 1 tablet (4 mg total) by mouth every 8 (eight) hours as needed for nausea or vomiting.  Loma [place] Calcium 500 MG TABS Take 2 tablets (2,500 mg total) by mouth 2 (two) times daily with a meal.  . pantoprazole (PROTONIX) 20 MG tablet Take 1 tablet (20 mg total) by mouth 2 (two) times daily. To decrease stomach acid  . prednisoLONE acetate (PRED FORTE) 1 % ophthalmic suspension Apply to eye.  . predniSONE 5 MG/5ML solution Take by mouth daily with  breakfast.  . Travoprost, BAK Free, (TRAVATAN Z) 0.004 % SOLN ophthalmic solution Apply 1 drop to eye at bedtime.  . TRUEPLUS LANCETS 28G MISC Use to check blood sugar up to 3 times daily.  . vitamin A 10000 UNIT capsule Take 10,000 Units by mouth daily.   No facility-administered encounter medications on file as of 03/01/2019.    An After Visit Summary was printed and given to the patient.  40 or more minutes of face-to-face time was spent with the patient obtaining her current history of present illness, review of systems, physical examination, review and discussion of assessment and treatment plan as well as additional time for review of labs, placement of referrals and completion of encounter note.  Follow-up: Return in about 8 weeks (around 04/26/2019) for chronic issues-f/u in 8-10 weeks and as needed.   Antony Blackbird MD

## 2019-03-02 ENCOUNTER — Other Ambulatory Visit (INDEPENDENT_AMBULATORY_CARE_PROVIDER_SITE_OTHER): Payer: Self-pay

## 2019-03-02 DIAGNOSIS — R002 Palpitations: Secondary | ICD-10-CM

## 2019-03-03 LAB — URINE CULTURE: Organism ID, Bacteria: NO GROWTH

## 2019-03-06 MED FILL — LEVOTHYROXINE SODIUM 100 MC: 100 | 30 days supply | Qty: 30 | Fill #4

## 2019-03-06 MED FILL — ?ATORVASTATIN 20 MG TABLET: 20 | 30 days supply | Qty: 30 | Fill #2

## 2019-03-06 MED FILL — AMLODIPINE BESYLATE 5 MG TA: 5 | 30 days supply | Qty: 30 | Fill #4

## 2019-03-06 MED FILL — TRAVATAN Z 0.004 % SOLN: 0.004 | 23 days supply | Qty: 3 | Fill #1

## 2019-03-06 MED FILL — LOSARTAN POTASSIUM 25 MG TA: 25 | 90 days supply | Qty: 90 | Fill #3

## 2019-03-07 MED FILL — GLIMEPIRIDE 4 MG TABS: 4 | 30 days supply | Qty: 15 | Fill #1

## 2019-03-07 MED FILL — CALCITRIOL 0.25 MCG CAPS: 0.25 | 30 days supply | Qty: 60 | Fill #2

## 2019-03-07 MED FILL — DORZOLAMIDE-TIMOLOL EYE DRP: 22.3-6.8 | 30 days supply | Qty: 10 | Fill #0

## 2019-03-08 ENCOUNTER — Other Ambulatory Visit: Payer: Self-pay | Admitting: Family Medicine

## 2019-03-08 DIAGNOSIS — E119 Type 2 diabetes mellitus without complications: Secondary | ICD-10-CM

## 2019-03-08 MED ORDER — TRUE METRIX BLOOD GLUCOSE TEST VI STRP: ORAL_STRIP | 4 refills | Status: DC

## 2019-03-08 MED FILL — TRUE METRIX TEST STRIP: 90 days supply | Qty: 100 | Fill #0

## 2019-03-08 NOTE — Progress Notes (Signed)
Patient ID: Norma Clay, female   DOB: 1950/02/04, 69 y.o.   MRN: [id number]   Patient's son here for appointment and reports that his mother needs a refill of her diabetic test strips and this will be sent to her pharmacy

## 2019-03-19 ENCOUNTER — Ambulatory Visit: Payer: Self-pay | Attending: Internal Medicine

## 2019-03-19 DIAGNOSIS — Z23 Encounter for immunization: Secondary | ICD-10-CM

## 2019-03-19 NOTE — Progress Notes (Signed)
   Covid-19 Vaccination Clinic  Name:  Norma Clay    MRN: [id number] DOB: [birth year]  03/19/2019  Norma Clay was observed post Covid-19 immunization for 15 minutes without incident. She was provided with Vaccine Information Sheet and instruction to access the V-Safe system.   Norma Clay was instructed to call 911 with any severe reactions post vaccine: Marland Kitchen Difficulty breathing  . Swelling of face and throat  . A fast heartbeat  . A bad rash all over body  . Dizziness and weakness   Immunizations Administered    Name Date Dose VIS Date Route   Pfizer COVID-19 Vaccine 03/19/2019 11:30 AM 0.3 mL 12/14/2018 Intramuscular   Manufacturer: [employer], [employer]   Lot: [id number]   NDC: [id number]

## 2019-03-25 ENCOUNTER — Other Ambulatory Visit: Payer: Self-pay

## 2019-03-25 ENCOUNTER — Encounter: Payer: Self-pay | Admitting: Internal Medicine

## 2019-03-25 ENCOUNTER — Ambulatory Visit (INDEPENDENT_AMBULATORY_CARE_PROVIDER_SITE_OTHER): Payer: Self-pay | Admitting: Internal Medicine

## 2019-03-25 DIAGNOSIS — E892 Postprocedural hypoparathyroidism: Secondary | ICD-10-CM

## 2019-03-25 MED ORDER — VITAMIN D3 10 MCG (400 UNIT) PO TABS: 400.0000 [IU] | ORAL_TABLET | Freq: Every day | ORAL | 1 refills | Status: DC

## 2019-03-25 MED ORDER — CALCIUM CARBONATE 1500 (600 CA) MG PO TABS: 1200.0000 mg | ORAL_TABLET | Freq: Two times a day (BID) | ORAL | 1 refills | Status: DC

## 2019-03-25 MED ORDER — CALCITRIOL 0.25 MCG PO CAPS: 0.5000 ug | ORAL_CAPSULE | Freq: Every day | ORAL | 3 refills | Status: DC

## 2019-03-25 NOTE — Patient Instructions (Addendum)
-   Continue Calcium Carbonate 5/600 00 mg TWO tablets with Lunch and TWO tablets with Dinner  - Continue Calcitriol two capsules a day  - Continue Over the counter Vitamin D3 400 iu daily

## 2019-03-25 NOTE — Progress Notes (Signed)
Name: Norma Clay  MRN/ DOB: [id number], 1950/03/02    Age/ Sex: 69 y.o., female     PCP: Antony Blackbird, MD   Reason for Endocrinology Evaluation: Post-surgical hypoparathyroidism     Initial Endocrinology Clinic Visit: 12/08/17    PATIENT IDENTIFIER: Norma Clay is a 69 y.o., female with a past medical history of Total thyroidectomy and T2DM. She has followed with [facility] since 12/08/2017 for consultative assistance with management of her Post-surgical hypoparathyroidism.   HISTORICAL SUMMARY:  She  moved from [place] in 2019. 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 She denies history of thyroid cancer. Pt noted hand spasms following 2nd surgery and has been on Calcium and Calcitriol since. Historically has compliance issues which results in serum calcium fluctuations.  24-hr urine collection normal 06/2018  SUBJECTIVE:   Today (03/25/2019):  Ms. [name] is here with her daughter Norma Clay. She is here for a follow up on hypocalcemia. Since her last visit , she has another serum calcium at 7.4 mg/dL, the patient states  Compliance with calcium carbonate at 3 tablets daily and calcitriol to 0.5 mcg daily. But there's a concern about compliance. She has stopped taking Vitamin D3 on her own but son made sure she has been back on it for the past month.   Today she denies any shortness of breath, she denies any numbness or perioral tingling, but she does endorse fatigue and aches.    Medication: Calcium carbonate (oyster shell) 500 mg, 2 tabs with lunch and 2 tabs with supper-taking 3 a day Calcitriol 0.25 MCG, 2 caps daily Vitamin D3 400 IU daily   ROS:  As per HPI.   HISTORY:  Past Medical History:  Past Medical History:  Diagnosis Date  . Diabetes mellitus without complication ([place])   . Glaucoma   . Hypertension    Past Surgical History:  Past Surgical History:  Procedure  Laterality Date  . GLAUCOMA SURGERY    . THYROIDECTOMY      Social History:  reports that she has never smoked. She has never used smokeless tobacco. She reports that she does not drink alcohol or use drugs.  Family History: family history includes Diabetes in her mother.   HOME MEDICATIONS: Allergies as of 03/25/2019      Reactions   Clindamycin/lincomycin Rash   Penicillins Rash   Patient was placed on a form of penicillin by her dentist and developed a rash therefore medication was changed to clindamycin.  Per son, patient is not allergic to clindamycin      Medication List       Accurate as of March 25, 2019 12:15 PM. If you have any questions, ask your nurse or doctor.        amLODipine 5 MG tablet Commonly known as: NORVASC Take 1 tablet (5 mg total) by mouth daily. To lower blood pressure   atorvastatin 20 MG tablet Commonly known as: LIPITOR Take 1 tablet (20 mg total) by mouth daily.   atropine 1 % ophthalmic solution Place one drop into the right eye 2 (two) times daily for 14 days. Do not take tonight.  Dr. Ander Slade will evaluate your post-operative status tomorrow and may make changes to the duration and dose of this medication.   calcitRIOL 0.25 MCG capsule Commonly known as: ROCALTROL Take 2 capsules (0.5 mcg total) by mouth daily.   calcium carbonate 1500 (600 Ca) MG  Tabs tablet Commonly known as: OSCAL Take 2 tablets (3,000 mg total) by mouth 2 (two) times daily with a meal. Started by: Dorita Sciara, MD   clarithromycin 500 MG tablet Commonly known as: BIAXIN Take 1 tablet (500 mg total) by mouth 2 (two) times daily.   COBALAMINE COMBINATIONS PO Take 500 mcg by mouth. Cobal (Mecobalamin)   diclofenac Sodium 1 % Gel Commonly known as: VOLTAREN RUB 2 GRAMS INTO AFFECTED AREA OF FOOT 2 TO 4 TIMES DAILY   docusate sodium 100 MG capsule Commonly known as: Colace One or two at bedtime to help with hard stools   dorzolamide-timolol 22.3-6.8  MG/ML ophthalmic solution Commonly known as: COSOPT Place 1 drop into both eyes 2 (two) times daily.   glimepiride 4 MG tablet Commonly known as: AMARYL Decrease to 1/2 pill once per day before first meal of the day   levofloxacin 250 MG tablet Commonly known as: Levaquin Take 1 tablet (250 mg total) by mouth daily. For bladder infection   levothyroxine 100 MCG tablet Commonly known as: SYNTHROID 1 pill once daily.  Do not eat for 30 minutes after taking p.o.   losartan 25 MG tablet Commonly known as: COZAAR Take 1 tablet (25 mg total) by mouth daily.   metFORMIN 850 MG tablet Commonly known as: GLUCOPHAGE Take 1 tablet (850 mg total) by mouth 2 (two) times daily with a meal.   moxifloxacin 0.5 % ophthalmic solution Commonly known as: VIGAMOX Place one drop into the right eye 3 (three) times a day for 21 days. Do not take tonight.  Dr. Ander Slade will evaluate your post-operative status tomorrow and may make changes to the duration and dose of this medication.   neomycin-polymyxin-dexameth 0.1 % Oint Commonly known as: MAXITROL Do not use tonight!   Apply a strip of ointment the size of a grain of rice to RIGHT eye at bedtime. This must be the last eye medicine of the day.   Dr. Ander Slade will evaluate your post-operative status tomorrow and may make changes to the duration and dose of this medication.   Netarsudil Dimesylate 0.02 % Soln Apply 1 drop to eye every evening.   nystatin cream Commonly known as: MYCOSTATIN Apply 1 application topically 2 (two) times daily. X 10 days then as needed   omeprazole 40 MG capsule Commonly known as: PRILOSEC Take 1 capsule (40 mg total) by mouth 2 (two) times daily.   ondansetron 4 MG tablet Commonly known as: Zofran Take 1 tablet (4 mg total) by mouth every 8 (eight) hours as needed for nausea or vomiting.   Oyster Shell Calcium 500 MG Tabs Take 2 tablets (2,500 mg total) by mouth 2 (two) times daily with a meal.   pantoprazole 20 MG  tablet Commonly known as: PROTONIX Take 1 tablet (20 mg total) by mouth 2 (two) times daily. To decrease stomach acid   prednisoLONE acetate 1 % ophthalmic suspension Commonly known as: PRED FORTE Apply to eye.   predniSONE 5 MG/5ML solution Take by mouth daily with breakfast.   Travatan Z 0.004 % Soln ophthalmic solution Generic drug: Travoprost (BAK Free) Apply 1 drop to eye at bedtime.   True Metrix Blood Glucose Test test strip Generic drug: glucose blood Use as instructed to check blood sugars once per day   True Metrix Meter w/Device Kit 1 each by Does not apply route 2 (two) times daily.   TRUEplus Lancets 28G Misc Use to check blood sugar up to 3 times daily.  vitamin A 10000 UNIT capsule Take 10,000 Units by mouth daily.   Vitamin D3 10 MCG (400 UNIT) tablet Take 1 tablet (400 Units total) by mouth daily. Started by: Dorita Sciara, MD         OBJECTIVE:   PHYSICAL EXAM: VS: BP 132/72 (BP Location: Left Arm, Patient Position: Sitting, Cuff Size: Normal)   Pulse 89   Temp 97.8 F (36.6 C)   Ht '5\' 8"'  (1.727 m)   Wt 178 lb 6.4 oz (80.9 kg)   SpO2 98%   BMI 27.13 kg/m    EXAM: General: Pt appears well and is in NAD  Lungs: Clear with good BS bilat with no rales, rhonchi, or wheezes  Heart: Auscultation: RRR.  Extremities: BL LE: No pretibial edema   Mental Status: Judgment, insight: Intact Mood and affect: No depression, anxiety, or agitation     DATA REVIEWED:  Results for NOREENE, BOREMAN (MRN [id number]) as of 03/25/2019 12:15  Ref. Range 02/08/2019 16:35  Sodium Latest Ref Range: 134 - 144 mmol/L 140  Potassium Latest Ref Range: 3.5 - 5.2 mmol/L 4.4  Chloride Latest Ref Range: 96 - 106 mmol/L 100  CO2 Latest Ref Range: 20 - 29 mmol/L 23  Glucose Latest Ref Range: 65 - 99 mg/dL 115 (H)  BUN Latest Ref Range: 8 - 27 mg/dL 26  Creatinine Latest Ref Range: 0.57 - 1.00 mg/dL 1.00  Calcium Latest Ref Range: 8.7 - 10.3 mg/dL 7.4 (L)   BUN/Creatinine Ratio Latest Ref Range: 12 - 28  26  Alkaline Phosphatase Latest Ref Range: 39 - 117 IU/L 104  Albumin Latest Ref Range: 3.8 - 4.8 g/dL 4.2  Albumin/Globulin Ratio Latest Ref Range: 1.2 - 2.2  1.2  AST Latest Ref Range: 0 - 40 IU/L 25  ALT Latest Ref Range: 0 - 32 IU/L 12  Total Protein Latest Ref Range: 6.0 - 8.5 g/dL 7.7  Total Bilirubin Latest Ref Range: 0.0 - 1.2 mg/dL 0.4  GFR, Est Non African American Latest Ref Range: >59 mL/min/1.73 57 (L)  GFR, Est African American Latest Ref Range: >59 mL/min/1.73 66  Globulin, Total Latest Ref Range: 1.5 - 4.5 g/dL 3.5     ASSESSMENT / PLAN / RECOMMENDATIONS:   1. Surgical Hypoparathyroidism :    - Pt is asymptomatic  -Patient with fear of hypercalcemia hence tends to reduce calcium intake.  I did reassure her that the occasional hypercalcemia does not cause long-term damage, but rather long-term hypercalcemia would, as well as uncontrolled diabetes.  - I have advised her to take calcium carbonate with lunch and supper meals for better absorption.  - The goals of therapy in patients with hypoparathyroidism are to relieve symptoms, to raise and maintain the serum calcium concentration in the low-normal range( 8.0 to 9 mg/dL), and to prevent iatrogenic development of kidney stones. Attainment of higher serum calcium values is not necessary and is usually limited by the development of hypercalciuria due to the loss of renal calcium-retaining effects of parathyroid hormone . -[place]ri[place], today she states that she is running low on her supplies, I have advised her I will [facility].  Medications: Increase  Calcium Carbonate 600 mg 2 tabs with lunch and 2 tabs with Supper  Continue Calcitriol 0.5 mcg daily  (0.25 mcg x2) Continue Vitamin D3 400 iu daily         F/u in 4 months  Labs in 2 weeks-patient would like to have  short-term lab appointment to make sure she does not have hypercalcemia as she is switching from the [place] supplementation.     Signed electronically by: Mack Guise, MD  [facility] End[facility]  [facility] [address] [place], [place] [address] Phone: [phone] FAX: [phone]      CC: Antony Blackbird, MD [place] [address] Phone: [phone]  Fax: [phone]   Return to Endocrinology clinic as below: Future Appointments  Date Time Provider [place]  03/28/2019  1:45 PM [facility] ECHO 3 [id number]  04/09/2019 10:10 AM [place], MD [facility]  04/26/2019 11:10 AM Antony Blackbird, MD [id number] None

## 2019-03-28 ENCOUNTER — Other Ambulatory Visit: Payer: Self-pay

## 2019-03-28 ENCOUNTER — Ambulatory Visit (HOSPITAL_COMMUNITY): Payer: Self-pay | Attending: Cardiology

## 2019-03-28 DIAGNOSIS — I739 Peripheral vascular disease, unspecified: Secondary | ICD-10-CM | POA: Insufficient documentation

## 2019-03-31 ENCOUNTER — Other Ambulatory Visit: Payer: Self-pay | Admitting: Family Medicine

## 2019-03-31 DIAGNOSIS — N3001 Acute cystitis with hematuria: Secondary | ICD-10-CM

## 2019-03-31 DIAGNOSIS — E119 Type 2 diabetes mellitus without complications: Secondary | ICD-10-CM

## 2019-03-31 MED ORDER — LEVOFLOXACIN 250 MG PO TABS: 250.0000 mg | ORAL_TABLET | Freq: Every day | ORAL | 0 refills | Status: DC

## 2019-03-31 MED ORDER — GLIMEPIRIDE 4 MG PO TABS: ORAL_TABLET | ORAL | 0 refills | Status: DC

## 2019-04-01 ENCOUNTER — Other Ambulatory Visit: Payer: Self-pay | Admitting: Family Medicine

## 2019-04-01 ENCOUNTER — Other Ambulatory Visit: Payer: Self-pay

## 2019-04-01 ENCOUNTER — Ambulatory Visit: Payer: Self-pay | Admitting: Internal Medicine

## 2019-04-01 DIAGNOSIS — I10 Essential (primary) hypertension: Secondary | ICD-10-CM

## 2019-04-01 DIAGNOSIS — R002 Palpitations: Secondary | ICD-10-CM

## 2019-04-01 DIAGNOSIS — R0602 Shortness of breath: Secondary | ICD-10-CM

## 2019-04-01 DIAGNOSIS — E119 Type 2 diabetes mellitus without complications: Secondary | ICD-10-CM

## 2019-04-01 MED FILL — AMLODIPINE BESYLATE 5 MG TA: 5 | 30 days supply | Qty: 30 | Fill #5

## 2019-04-01 MED FILL — CALCITRIOL 0.25 MCG CAPS: 0.25 | 30 days supply | Qty: 60 | Fill #3

## 2019-04-01 MED FILL — TRAVATAN Z 0.004 % SOLN: 0.004 | 23 days supply | Qty: 3 | Fill #2

## 2019-04-01 MED FILL — ?ATORVASTATIN 20 MG TABLET: 20 | 30 days supply | Qty: 30 | Fill #3

## 2019-04-01 MED FILL — DORZOLAMIDE HCL-TIMOLOL MAL: 22.3-6.8 | 40 days supply | Qty: 10 | Fill #6

## 2019-04-01 MED FILL — levoFLOXacin 250 MG TABS: 250 | 5 days supply | Qty: 5 | Fill #0

## 2019-04-01 MED FILL — LEVOTHYROXINE SODIUM 100 MC: 100 | 30 days supply | Qty: 30 | Fill #5

## 2019-04-01 MED FILL — GLIMEPIRIDE 4 MG TABS: 4 | 30 days supply | Qty: 15 | Fill #0

## 2019-04-02 MED FILL — metFORMIN HCL 850 MG TABS: 850 | 30 days supply | Qty: 60 | Fill #0

## 2019-04-03 ENCOUNTER — Encounter: Payer: Self-pay | Admitting: Family Medicine

## 2019-04-03 ENCOUNTER — Other Ambulatory Visit: Payer: Self-pay | Admitting: Family Medicine

## 2019-04-03 DIAGNOSIS — E119 Type 2 diabetes mellitus without complications: Secondary | ICD-10-CM

## 2019-04-03 DIAGNOSIS — I10 Essential (primary) hypertension: Secondary | ICD-10-CM

## 2019-04-03 MED ORDER — LOSARTAN POTASSIUM 25 MG PO TABS: 25.0000 mg | ORAL_TABLET | Freq: Every day | ORAL | 2 refills | Status: DC

## 2019-04-03 MED ORDER — METFORMIN HCL 850 MG PO TABS: 850.0000 mg | ORAL_TABLET | Freq: Two times a day (BID) | ORAL | 2 refills | Status: DC

## 2019-04-03 NOTE — Progress Notes (Signed)
`  Patient ID: Norma Clay, female   DOB: 1950-10-12, 69 y.o.   MRN: [id number]   Message received from patient's son that patient needs refill on losartan and Metformin.  New prescriptions were sent to patient's pharmacy

## 2019-04-09 ENCOUNTER — Ambulatory Visit (INDEPENDENT_AMBULATORY_CARE_PROVIDER_SITE_OTHER): Payer: Self-pay | Admitting: Gastroenterology

## 2019-04-09 ENCOUNTER — Encounter: Payer: Self-pay | Admitting: Gastroenterology

## 2019-04-09 VITALS — BP 132/70 | HR 80 | Temp 98.2°F | Ht 68.0 in | Wt 176.0 lb

## 2019-04-09 DIAGNOSIS — R1013 Epigastric pain: Secondary | ICD-10-CM

## 2019-04-09 DIAGNOSIS — K5900 Constipation, unspecified: Secondary | ICD-10-CM

## 2019-04-09 DIAGNOSIS — Z8619 Personal history of other infectious and parasitic diseases: Secondary | ICD-10-CM

## 2019-04-09 DIAGNOSIS — R109 Unspecified abdominal pain: Secondary | ICD-10-CM

## 2019-04-09 NOTE — Progress Notes (Signed)
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  Given temporary improvement in symptoms while on triple therapy for H. pylori, persistent infection must be considered.  Differential also includes esophagitis, gastritis, malignancy, gastroparesis in the setting of diabetes, gastric outlet obstruction, functional dyspepsia, and small intestinal bacterial overgrowth. Worsened by polypharmacy per the family's concern.   We discussed abdominal imaging versus endoscopic evaluation.  She and her family are very concerned about her ability to fast prior to endoscopy. Does not think she can tolerate bowel prep and pre-colonoscopy diet/fasting.   We will continue proton pump inhibitor therapy twice daily.  Proceed with EGD to obtain gastric biopsies.  Consider contrasted cross-sectional imaging if the EGD is nondiagnostic.  Her chronic constipation with sense of incomplete evacuation may be contributing to her other symptoms.  I recommended that she add a daily dose of psyllium increasing after 1 week if there is been no improvement in her constipation.   PLAN: Add a daily stool bulking agent such as Metumucil, increase to twice daily after one week if no improvement in your constipation Continue omeprazole 40 mg BID EGD to evaluate the epigastric abdominal pain CT abd/pelvis with contrast if the EGD is non-diagnostic Ask Dr. Chapman Fitch about ways to minimize her  medications  Please see the "Patient Instructions" section for addition details about the plan.  I spent 60-74 minutes, including in depth chart review, independent review of results, communicating results with the patient directly with the help of the arabic interpreter, face-to-face time with the patient, coordinating care, ordering studies and medications as appropriate, and documentation.    HPI: Norma Clay is a 69 y.o. female referred by Dr. Chapman Fitch for further evaluation of abdominal pain.  The history is obtained through the patient with the assistance of an Arabic interpreter and review of her electronic health record. Her son accompanies her to this appointment.  She has type 2 diabetes with her most recent hemoglobin A1C 7.2, hypertension, osteoarthritis, hyperlipidemia.  She had a total thyroidectomy and now has postsurgical hypoparathyroidism.  Reporting a sharp, nearly consistent upper abdominal pain, nausea, eructation,  frequent headaches, CVA tenderness, back pain, and left-sided lower abdominal pain.  Has been reporting the symptoms to her family multiple times daily over the last year.  Pain worsened by eating.  Not improved by movement or defecation.   H. pylori antigen was positive and she was treated with clarithromycin, metronidazole, and omeprazole with temporary improvement in upper abdominal discomfort and appetite. Symptoms returned a few days after completing treatment.  Her son is concerned that she is taking so many medications.  Appetite is now good. Weight is stable.   Ongoing constipation with a bowel movement every day with hard, small stools.  No blood or mucus.  Pain with defecation.  Dr. Chapman Fitch recommended increasing water and fiber in the diet.  She was given a prescription for Colace but she did not start the medication because she forgot about it. No blood or mucous.   No special diets. Drinks a a  gallon of water every 2 days.   Recent labs show a normal  comprehensive metabolic panel 06/04/8313 except for a glucose of 115 and a calcium of 7.4.  CBC was normal.  Liver enzymes are normal.  TSH normal at 1.090.  An abnormal UA 03/01/2019 with trace leukocytes, negative nitrites.  Associated bacterial culture was negative.  Prior abdominal imaging includes: - Abdominal x-ray 12/14/2017: No acute abnormality.  Normal bowel gas pattern. - CT of the abdomen and pelvis without contrast 07/24/2018 no acute process, possible constipation, small hiatal hernia, and mild pelvic floor laxity.  Upper endoscopy in [place] in 2010 for this pain but was told the results were normal. She was told not to eat spicy foods.  No prior colon cancer screening.  Mother with breast cancer. No known family history of colon cancer or polyps. No family history of uterine/endometrial cancer, pancreatic cancer or gastric/stomach cancer.   Past Medical History:  Diagnosis Date  . Diabetes mellitus without complication ([place])   . Glaucoma   . Hypertension     Past Surgical History:  Procedure Laterality Date  . GLAUCOMA SURGERY    . THYROIDECTOMY      Current Outpatient Medications  Medication Sig Dispense Refill  . amLODipine (NORVASC) 5 MG tablet Take 1 tablet (5 mg total) by mouth daily. To lower blood pressure 90 tablet 3  . atorvastatin (LIPITOR) 20 MG tablet Take 1 tablet (20 mg total) by mouth daily. 90 tablet 2  . atropine 1 % ophthalmic solution Place one drop into the right eye 2 (two) times daily for 14 days. Do not take tonight.  Dr. Ander Slade will evaluate your post-operative status tomorrow and may make changes to the duration and dose of this medication.    . Blood Glucose Monitoring Suppl (TRUE METRIX METER) w/Device KIT 1 each by Does not apply route 2 (two) times daily. 1 kit 0  . calcitRIOL (ROCALTROL) 0.25 MCG capsule Take 2 capsules (0.5 mcg total) by mouth daily. 180 capsule 3  . calcium carbonate (OSCAL) 1500 (600 Ca) MG TABS tablet Take 2 tablets (3,000 mg  total) by mouth 2 (two) times daily with a meal. 360 tablet 1  . Cholecalciferol (VITAMIN D3) 10 MCG (400 UNIT) tablet Take 1 tablet (400 Units total) by mouth daily. 90 tablet 1  . clarithromycin (BIAXIN) 500 MG tablet Take 1 tablet (500 mg total) by mouth 2 (two) times daily. 20 tablet 0  . COBALAMINE COMBINATIONS PO Take 500 mcg by mouth. Cobal (Mecobalamin)    . diclofenac Sodium (VOLTAREN) 1 % GEL RUB 2 GRAMS INTO AFFECTED AREA OF FOOT 2 TO 4 TIMES DAILY 100 g 2  . docusate sodium (COLACE) 100 MG capsule One or two at bedtime to help with hard stools 60 capsule 3  . dorzolamide-timolol (COSOPT) 22.3-6.8 MG/ML ophthalmic solution Place 1 drop into both eyes 2 (two) times daily. 10 mL 0  . glimepiride (AMARYL) 4 MG tablet Decrease to 1/2 pill once per day before first meal of the day 45 tablet 0  . glucose blood (TRUE METRIX BLOOD GLUCOSE TEST) test strip Use as instructed to check blood sugars once per day 100 each 4  . levofloxacin (LEVAQUIN) 250 MG tablet Take 1 tablet (250 mg total) by mouth daily. For bladder infection 5 tablet 0  . levothyroxine (SYNTHROID) 100 MCG tablet 1 pill once daily.  Do not eat for 30 minutes after taking p.o. 90 tablet 1  . losartan (COZAAR) 25 MG tablet Take  1 tablet (25 mg total) by mouth daily. 90 tablet 2  . metFORMIN (GLUCOPHAGE) 850 MG tablet Take 1 tablet (850 mg total) by mouth 2 (two) times daily with a meal. 180 tablet 2  . moxifloxacin (VIGAMOX) 0.5 % ophthalmic solution Place one drop into the right eye 3 (three) times a day for 21 days. Do not take tonight.  Dr. Ander Slade will evaluate your post-operative status tomorrow and may make changes to the duration and dose of this medication.    Marland Kitchen neomycin-polymyxin-dexameth (MAXITROL) 0.1 % OINT Do not use tonight!   Apply a strip of ointment the size of a grain of rice to RIGHT eye at bedtime. This must be the last eye medicine of the day.   Dr. Ander Slade will evaluate your post-operative status tomorrow and may  make changes to the duration and dose of this medication.    . Netarsudil Dimesylate 0.02 % SOLN Apply 1 drop to eye every evening.    . nystatin cream (MYCOSTATIN) Apply 1 application topically 2 (two) times daily. X 10 days then as needed 30 g 2  . omeprazole (PRILOSEC) 40 MG capsule Take 1 capsule (40 mg total) by mouth 2 (two) times daily. 60 capsule 0  . ondansetron (ZOFRAN) 4 MG tablet Take 1 tablet (4 mg total) by mouth every 8 (eight) hours as needed for nausea or vomiting. 20 tablet 0  . Oyster Shell Calcium 500 MG TABS Take 2 tablets (2,500 mg total) by mouth 2 (two) times daily with a meal. 120 tablet 11  . pantoprazole (PROTONIX) 20 MG tablet Take 1 tablet (20 mg total) by mouth 2 (two) times daily. To decrease stomach acid 180 tablet 3  . prednisoLONE acetate (PRED FORTE) 1 % ophthalmic suspension Apply to eye.    . predniSONE 5 MG/5ML solution Take by mouth daily with breakfast.    . Travoprost, BAK Free, (TRAVATAN Z) 0.004 % SOLN ophthalmic solution Apply 1 drop to eye at bedtime.    . TRUEPLUS LANCETS 28G MISC Use to check blood sugar up to 3 times daily. 100 each 11  . vitamin A 10000 UNIT capsule Take 10,000 Units by mouth daily.     No current facility-administered medications for this visit.    Allergies as of 04/09/2019 - Review Complete 03/25/2019  Allergen Reaction Noted  . Clindamycin/lincomycin Rash 11/28/2016  . Penicillins Rash 09/14/2017    Family History  Problem Relation Age of Onset  . Diabetes Mother     Social History   Socioeconomic History  . Marital status: Widowed    Spouse name: Not on file  . Number of children: Not on file  . Years of education: Not on file  . Highest education level: Not on file  Occupational History  . Not on file  Tobacco Use  . Smoking status: Never Smoker  . Smokeless tobacco: Never Used  Substance and Sexual Activity  . Alcohol use: No  . Drug use: No  . Sexual activity: Not Currently  Other Topics Concern  .  Not on file  Social History Narrative  . Not on file   Social Determinants of Health   Financial Resource Strain:   . Difficulty of Paying Living Expenses:   Food Insecurity:   . Worried About Charity fundraiser in the Last Year:   . Arboriculturist in the Last Year:   Transportation Needs:   . Film/video editor (Medical):   Marland Kitchen Lack of Transportation (Non-Medical):  Physical Activity:   . Days of Exercise per Week:   . Minutes of Exercise per Session:   Stress:   . Feeling of Stress :   Social Connections:   . Frequency of Communication with Friends and Family:   . Frequency of Social Gatherings with Friends and Family:   . Attends Religious Services:   . Active Member of Clubs or Organizations:   . Attends Archivist Meetings:   Marland Kitchen Marital Status:   Intimate Partner Violence:   . Fear of Current or Ex-Partner:   . Emotionally Abused:   Marland Kitchen Physically Abused:   . Sexually Abused:     Review of Systems: 12 system ROS is negative except as noted above.   Physical Exam: General:   Alert,  well-nourished, pleasant and cooperative in NAD Head:  Normocephalic and atraumatic. Eyes:  Sclera clear, no icterus.   Conjunctiva pink. Ears:  Normal auditory acuity. Nose:  No deformity, discharge,  or lesions. Mouth:  No deformity or lesions.   Neck:  Supple; no masses or thyromegaly. Lungs:  Clear throughout to auscultation.   No wheezes. Heart:  Regular rate and rhythm; no murmurs. Abdomen:  Soft, mild epigastric tenderness, nondistended, normal bowel sounds, no rebound or guarding.  Localizes pain to the left flank.  No hepatosplenomegaly.   Rectal:  Deferred  Msk:  Symmetrical. No boney deformities LAD: No inguinal or umbilical LAD Extremities:  No clubbing or edema. Neurologic:  Alert and  oriented x4;  grossly nonfocal Skin:  Intact without significant lesions or rashes. Psych:  Alert and cooperative. Normal mood and affect.    Telsa Dillavou L. Tarri Glenn, MD,  [facility] 04/09/2019, 10:28 AM

## 2019-04-09 NOTE — Patient Instructions (Addendum)
Please start taking a stool bulking agent such as Metamucil or Benefiber every day.  I have not changed any of your other medications.  I have recommended an upper endoscopy for further evaluation of your symptoms. If the upper endoscopy does not give [place] an answer, I will recommend a CT of the abd/pelvis with contrast.  Continue omeprazole 40 mg twice a day.

## 2019-04-17 ENCOUNTER — Ambulatory Visit (AMBULATORY_SURGERY_CENTER): Payer: Self-pay | Admitting: Gastroenterology

## 2019-04-17 ENCOUNTER — Other Ambulatory Visit: Payer: Self-pay

## 2019-04-17 ENCOUNTER — Encounter: Payer: Self-pay | Admitting: Gastroenterology

## 2019-04-17 VITALS — BP 125/69 | HR 77 | Temp 96.6°F | Resp 18 | Ht 68.0 in | Wt 176.0 lb

## 2019-04-17 DIAGNOSIS — B9681 Helicobacter pylori [H. pylori] as the cause of diseases classified elsewhere: Secondary | ICD-10-CM

## 2019-04-17 DIAGNOSIS — K219 Gastro-esophageal reflux disease without esophagitis: Secondary | ICD-10-CM

## 2019-04-17 DIAGNOSIS — K2950 Unspecified chronic gastritis without bleeding: Secondary | ICD-10-CM

## 2019-04-17 DIAGNOSIS — R1013 Epigastric pain: Secondary | ICD-10-CM

## 2019-04-17 DIAGNOSIS — K3189 Other diseases of stomach and duodenum: Secondary | ICD-10-CM

## 2019-04-17 MED ORDER — SODIUM CHLORIDE 0.9 % IV SOLN: 500.0000 mL | Freq: Once | INTRAVENOUS | Status: DC

## 2019-04-17 NOTE — Progress Notes (Signed)
PT taken to PACU. Monitors in place. VSS. Report given to RN. 

## 2019-04-17 NOTE — Progress Notes (Signed)
Called to room to assist during endoscopic procedure.  Patient ID and intended procedure confirmed with present staff. Received instructions for my participation in the procedure from the performing physician.  

## 2019-04-17 NOTE — Progress Notes (Signed)
Vitals-CW Temp-JB  History reviewed. 

## 2019-04-17 NOTE — Op Note (Signed)
[facility] Patient Name: Norma Clay Procedure Date: 04/17/2019 8:12 AM MRN: [id number] Endoscopist: Tressia Danas MD, MD Age: 69 Referring MD:  Date of Birth: [birth year] Gender: Female Account #: [id number] Procedure:                Upper GI endoscopy Indications:              Epigastric abdominal pain, Abdominal pain in the                            left upper quadrant Medicines:                Monitored Anesthesia Care Procedure:                Pre-Anesthesia Assessment:                           - Prior to the procedure, a History and Physical                            was performed, and patient medications and                            allergies were reviewed. The patient's tolerance of                            previous anesthesia was also reviewed. The risks                            and benefits of the procedure and the sedation                            options and risks were discussed with the patient.                            All questions were answered, and informed consent                            was obtained. Prior Anticoagulants: The patient has                            taken no previous anticoagulant or antiplatelet                            agents. ASA Grade Assessment: II - A patient with                            mild systemic disease. After reviewing the risks                            and benefits, the patient was deemed in                            satisfactory condition to undergo the procedure.  After obtaining informed consent, the endoscope was                            passed under direct vision. Throughout the                            procedure, the patient's blood pressure, pulse, and                            oxygen saturations were monitored continuously. The                            Endoscope was introduced through the mouth, and                            advanced to the third part of  duodenum. The upper                            GI endoscopy was accomplished without difficulty.                            The patient tolerated the procedure well. Scope In: Scope Out: Findings:                 The examined esophagus was normal. Biopsies were                            taken with a cold forceps for histology. Estimated                            blood loss was minimal.                           The entire examined stomach was normal. Biopsies                            were taken with a cold forceps for histology.                            Estimated blood loss was minimal.                           Patchy mildly erythematous mucosa without active                            bleeding and with no stigmata of bleeding was found                            in the duodenal bulb. Biopsies were taken with a                            cold forceps for histology. Estimated blood loss  was minimal.                           The cardia and gastric fundus were normal on                            retroflexion.                           The exam was otherwise without abnormality. Complications:            No immediate complications. Estimated blood loss:                            Minimal. Estimated Blood Loss:     Estimated blood loss was minimal. Impression:               - Normal esophagus. Biopsied.                           - Normal stomach. Biopsied.                           - Erythematous duodenopathy. Biopsied.                           - The examination was otherwise normal. Recommendation:           - Patient has a contact number available for                            emergencies. The signs and symptoms of potential                            delayed complications were discussed with the                            patient. Return to normal activities tomorrow.                            Written discharge instructions were provided to the                             patient.                           - Resume previous diet.                           - Continue present medications including omeprazole                            40 mg BID. Add famotidine 20 mg BID.                           - No aspirin, ibuprofen, naproxen, or other  non-steroidal anti-inflammatory drugs.                           - Await pathology results.                           - Proceed with CT of the abd/pelvis for further                            evaluation of the abdominal pain if the biopsies                            are negative. Thornton Park MD, MD 04/17/2019 8:40:12 AM This report has been signed electronically.

## 2019-04-17 NOTE — Patient Instructions (Signed)
No Aspirin, ibuprofen, naproxen or other NSAIDS.  YOU HAD AN ENDOSCOPIC PROCEDURE TODAY AT THE [facility]:   Refer to the procedure report that was given to you for any specific questions about what was found during the examination.  If the procedure report does not answer your questions, please call your gastroenterologist to clarify.  If you requested that your care partner not be given the details of your procedure findings, then the procedure report has been included in a sealed envelope for you to review at your convenience later.  YOU SHOULD EXPECT: Some feelings of bloating in the abdomen. Passage of more gas than usual.  Walking can help get rid of the air that was put into your GI tract during the procedure and reduce the bloating. If you had a lower endoscopy (such as a colonoscopy or flexible sigmoidoscopy) you may notice spotting of blood in your stool or on the toilet paper. If you underwent a bowel prep for your procedure, you may not have a normal bowel movement for a few days.  Please Note:  You might notice some irritation and congestion in your nose or some drainage.  This is from the oxygen used during your procedure.  There is no need for concern and it should clear up in a day or so.  SYMPTOMS TO REPORT IMMEDIATELY:   Following upper endoscopy (EGD)  Vomiting of blood or coffee ground material  New chest pain or pain under the shoulder blades  Painful or persistently difficult swallowing  New shortness of breath  Fever of 100F or higher  Black, tarry-looking stools  For urgent or emergent issues, a gastroenterologist can be reached at any hour by calling [phone]. Do not use MyChart messaging for urgent concerns.    DIET:  We do recommend a small meal at first, but then you may proceed to your regular diet.  Drink plenty of fluids but you should avoid alcoholic beverages for 24 hours.  ACTIVITY:  You should plan to take it easy for the rest of today  and you should NOT DRIVE or use heavy machinery until tomorrow (because of the sedation medicines used during the test).    FOLLOW UP: Our staff will call the number listed on your records 48-72 hours following your procedure to check on you and address any questions or concerns that you may have regarding the information given to you following your procedure. If we do not reach you, we will leave a message.  We will attempt to reach you two times.  During this call, we will ask if you have developed any symptoms of COVID 19. If you develop any symptoms (ie: fever, flu-like symptoms, shortness of breath, cough etc.) before then, please call [phone].  If you test positive for Covid 19 in the 2 weeks post procedure, please call and report this information to [place].    If any biopsies were taken you will be contacted by phone or by letter within the next 1-3 weeks.  Please call us at [phone] if you have not heard about the biopsies in 3 weeks.    SIGNATURES/CONFIDENTIALITY: You and/or your care partner have signed paperwork which will be entered into your electronic medical record.  These signatures attest to the fact that that the information above on your After Visit Summary has been reviewed and is understood.  Full responsibility of the confidentiality of this discharge information lies with you and/or your care-partner.

## 2019-04-18 LAB — LIPID PANEL
Chol/HDL Ratio: 2 ratio (ref 0.0–4.4)
Cholesterol, Total: 138 mg/dL (ref 100–199)
HDL: 68 mg/dL (ref >39)
LDL Chol Calc (NIH): 59 mg/dL (ref 0–99)
Triglycerides: 50 mg/dL (ref 0–149)
VLDL Cholesterol Cal: 11 mg/dL (ref 5–40)

## 2019-04-18 LAB — HEPATIC FUNCTION PANEL
ALT: 38 IU/L — ABNORMAL HIGH (ref 0–32)
AST: 40 IU/L (ref 0–40)
Albumin: 3.9 g/dL (ref 3.8–4.8)
Alkaline Phosphatase: 94 IU/L (ref 39–117)
Bilirubin Total: 0.6 mg/dL (ref 0.0–1.2)
Bilirubin, Direct: 0.17 mg/dL (ref 0.00–0.40)
Total Protein: 6.8 g/dL (ref 6.0–8.5)

## 2019-04-19 ENCOUNTER — Telehealth: Payer: Self-pay

## 2019-04-19 NOTE — Telephone Encounter (Signed)
  Follow up Call-  Call back number 04/17/2019  Post procedure Call Back phone  # [phone] son  Permission to leave phone message Yes  Some recent data might be hidden     Left message

## 2019-04-19 NOTE — Telephone Encounter (Signed)
LVM

## 2019-04-23 ENCOUNTER — Telehealth: Payer: Self-pay | Admitting: Emergency Medicine

## 2019-04-23 ENCOUNTER — Other Ambulatory Visit: Payer: Self-pay

## 2019-04-23 DIAGNOSIS — A048 Other specified bacterial intestinal infections: Secondary | ICD-10-CM

## 2019-04-23 MED ORDER — TETRACYCLINE HCL 500 MG PO CAPS: 500.0000 mg | ORAL_CAPSULE | Freq: Four times a day (QID) | ORAL | 0 refills | Status: DC

## 2019-04-23 MED ORDER — PANTOPRAZOLE SODIUM 40 MG PO TBEC: 40.0000 mg | DELAYED_RELEASE_TABLET | Freq: Two times a day (BID) | ORAL | 0 refills | Status: DC

## 2019-04-23 MED ORDER — METRONIDAZOLE 500 MG PO TABS: 500.0000 mg | ORAL_TABLET | Freq: Four times a day (QID) | ORAL | 0 refills | Status: DC

## 2019-04-23 MED ORDER — BISMUTH SUBSALICYLATE 262 MG PO TABS: 262.0000 mg | ORAL_TABLET | Freq: Four times a day (QID) | ORAL | 0 refills | Status: DC

## 2019-04-23 MED FILL — ?PANTOPRAZOLE SO DR 40MG TA: 40 | 14 days supply | Qty: 28 | Fill #0

## 2019-04-23 NOTE — Telephone Encounter (Signed)
With her history, this is the best option. Perhaps another pharmacy has it?

## 2019-04-23 NOTE — Telephone Encounter (Signed)
Pharmacy does not have tetracycline it is on back order. Is there any alternative?

## 2019-04-24 MED FILL — metFORMIN HCL 850 MG TABS: 850 | 30 days supply | Qty: 60 | Fill #1

## 2019-04-24 MED FILL — TRAVATAN Z 0.004 % SOLN: 0.004 | 23 days supply | Qty: 3 | Fill #3

## 2019-04-24 MED FILL — GLIMEPIRIDE 4 MG TABS: 4 | 30 days supply | Qty: 15 | Fill #1

## 2019-04-24 MED FILL — CALCITRIOL 0.25 MCG CAPS: 0.25 | 30 days supply | Qty: 60 | Fill #4

## 2019-04-24 NOTE — Telephone Encounter (Signed)
Spoke with pharmacist and he states prescription is only $10 thru their pharmacy whereas at other pharmacies it  Will be over $100. pharmacist wanted to know if tri-therapy with Clarithromycin and Amoxicillin and PPI. Please advise. Her notes look like she is not actually allergic to clindamycin.

## 2019-04-24 NOTE — Telephone Encounter (Signed)
She has already had that treatment and it did not work so it is not an option. Thank you.

## 2019-04-25 ENCOUNTER — Other Ambulatory Visit: Payer: Self-pay

## 2019-04-25 ENCOUNTER — Other Ambulatory Visit (INDEPENDENT_AMBULATORY_CARE_PROVIDER_SITE_OTHER): Payer: Self-pay

## 2019-04-25 LAB — BASIC METABOLIC PANEL
BUN: 29 mg/dL — ABNORMAL HIGH (ref 6–23)
CO2: 28 mEq/L (ref 19–32)
Calcium: 8.7 mg/dL (ref 8.4–10.5)
Chloride: 101 mEq/L (ref 96–112)
Creatinine, Ser: 0.98 mg/dL (ref 0.40–1.20)
GFR: 56.06 mL/min — ABNORMAL LOW (ref >60.00)
Glucose, Bld: 196 mg/dL — ABNORMAL HIGH (ref 70–99)
Potassium: 4 mEq/L (ref 3.5–5.1)
Sodium: 137 mEq/L (ref 135–145)

## 2019-04-25 LAB — VITAMIN D 25 HYDROXY (VIT D DEFICIENCY, FRACTURES): VITD: 48.6 ng/mL (ref 30.00–100.00)

## 2019-04-25 LAB — ALBUMIN: Albumin: 4.1 g/dL (ref 3.5–5.2)

## 2019-04-25 MED FILL — LEVOTHYROXINE SODIUM 100 MC: 100 | 30 days supply | Qty: 30 | Fill #0

## 2019-04-25 NOTE — Telephone Encounter (Signed)
Spoke with pharmacy and Tetracycline is still on back order, they are not sure when they can get it back in. They advised we can send it to a nearby pharmacy. I will reach out to the patient and see what she would like.

## 2019-04-25 NOTE — Telephone Encounter (Signed)
Spoke with patients son Rene Paci and he states he is ok with waiting until they get the prescription in because this is not a new problem and he wants her to have the best treatment. He does not want the prescription sent to another pharmacy because she does not have insurance and it will be too expensive. [facility] and informed.

## 2019-04-25 NOTE — Telephone Encounter (Signed)
Thank you :)

## 2019-04-25 NOTE — Telephone Encounter (Signed)
Left voicemail for patient's son.

## 2019-04-26 ENCOUNTER — Ambulatory Visit: Payer: Self-pay | Admitting: Family Medicine

## 2019-04-29 MED FILL — AMLODIPINE BESYLATE 5 MG TA: 5 | 30 days supply | Qty: 30 | Fill #6

## 2019-05-01 ENCOUNTER — Ambulatory Visit: Payer: Self-pay | Admitting: Cardiovascular Disease

## 2019-05-02 MED FILL — PREDNISOLONE AC 1% EYE DROP: 1 | 18 days supply | Qty: 5 | Fill #0

## 2019-05-03 MED FILL — GLIMEPIRIDE 4 MG TABS: 4 | 30 days supply | Qty: 15 | Fill #2

## 2019-05-03 MED FILL — metFORMIN HCL 850 MG TABS: 850 | 30 days supply | Qty: 60 | Fill #2

## 2019-05-03 MED FILL — TRAVATAN Z 0.004 % SOLN: 0.004 | 23 days supply | Qty: 3 | Fill #4

## 2019-05-03 MED FILL — LEVOTHYROXINE SODIUM 100 MC: 100 | 30 days supply | Qty: 30 | Fill #1

## 2019-05-03 MED FILL — ?ATORVASTATIN 20 MG TABLET: 20 | 30 days supply | Qty: 30 | Fill #2

## 2019-05-03 MED FILL — DORZOLAMIDE-TIMOLOL EYE DRP: 22.3-6.8 | 40 days supply | Qty: 10 | Fill #7

## 2019-05-07 ENCOUNTER — Encounter: Payer: Self-pay | Admitting: Family Medicine

## 2019-05-08 ENCOUNTER — Other Ambulatory Visit: Payer: Self-pay | Admitting: Family Medicine

## 2019-05-08 DIAGNOSIS — R413 Other amnesia: Secondary | ICD-10-CM

## 2019-05-08 NOTE — Progress Notes (Signed)
Patient ID: Norma Clay, female   DOB: June 07, 1950, 69 y.o.   MRN: [id number]   Patient's son is concerned that she is having issues with her memory which may affect her ability to pass the citizenship test. Will place Neurology appt for assessment of patient's memory

## 2019-05-09 ENCOUNTER — Encounter: Payer: Self-pay | Admitting: Neurology

## 2019-05-10 ENCOUNTER — Ambulatory Visit: Payer: Self-pay | Attending: Family Medicine | Admitting: Family Medicine

## 2019-05-10 ENCOUNTER — Encounter: Payer: Self-pay | Admitting: Family Medicine

## 2019-05-10 ENCOUNTER — Other Ambulatory Visit: Payer: Self-pay | Admitting: Family Medicine

## 2019-05-10 ENCOUNTER — Other Ambulatory Visit: Payer: Self-pay

## 2019-05-10 VITALS — BP 134/77 | HR 96 | Temp 97.7°F | Ht 68.0 in | Wt 173.6 lb

## 2019-05-10 DIAGNOSIS — I10 Essential (primary) hypertension: Secondary | ICD-10-CM

## 2019-05-10 DIAGNOSIS — J309 Allergic rhinitis, unspecified: Secondary | ICD-10-CM

## 2019-05-10 DIAGNOSIS — H6121 Impacted cerumen, right ear: Secondary | ICD-10-CM

## 2019-05-10 DIAGNOSIS — H9201 Otalgia, right ear: Secondary | ICD-10-CM

## 2019-05-10 DIAGNOSIS — E119 Type 2 diabetes mellitus without complications: Secondary | ICD-10-CM

## 2019-05-10 DIAGNOSIS — Z603 Acculturation difficulty: Secondary | ICD-10-CM

## 2019-05-10 DIAGNOSIS — Z758 Other problems related to medical facilities and other health care: Secondary | ICD-10-CM

## 2019-05-10 DIAGNOSIS — Z789 Other specified health status: Secondary | ICD-10-CM

## 2019-05-10 MED ORDER — LOSARTAN POTASSIUM 25 MG PO TABS: 25.0000 mg | ORAL_TABLET | Freq: Every day | ORAL | 3 refills | Status: DC

## 2019-05-10 MED ORDER — LORATADINE 10 MG PO TABS: 10.0000 mg | ORAL_TABLET | Freq: Every day | ORAL | 11 refills | Status: DC

## 2019-05-10 MED ORDER — AMLODIPINE BESYLATE 5 MG PO TABS: 5.0000 mg | ORAL_TABLET | Freq: Every day | ORAL | 3 refills | Status: DC

## 2019-05-10 MED ORDER — GLIMEPIRIDE 4 MG PO TABS: ORAL_TABLET | ORAL | 1 refills | Status: DC

## 2019-05-10 MED FILL — GLIMEPIRIDE 4 MG TABS: 4 | 30 days supply | Qty: 30 | Fill #0

## 2019-05-10 MED FILL — AMLODIPINE BESYLATE 5 MG TA: 5 | 90 days supply | Qty: 90 | Fill #0

## 2019-05-10 MED FILL — ?LORATADINE 10 MG TABS: 10 | 30 days supply | Qty: 30 | Fill #0

## 2019-05-10 NOTE — Progress Notes (Signed)
Med refills for Glimepiride and Losartan and she is also here for chronic issues f/u  Per pt she's been having sore throat for the past 3 weeks now and not taking anything for it.

## 2019-05-10 NOTE — Patient Instructions (Signed)
Earwax Buildup, Adult The ears produce a substance called earwax that helps keep bacteria out of the ear and protects the skin in the ear canal. Occasionally, earwax can build up in the ear and cause discomfort or hearing loss. What increases the risk? This condition is more likely to develop in people who:  Are female.  Are elderly.  Naturally produce more earwax.  Clean their ears often with cotton swabs.  Use earplugs often.  Use in-ear headphones often.  Wear hearing aids.  Have narrow ear canals.  Have earwax that is overly thick or sticky.  Have eczema.  Are dehydrated.  Have excess hair in the ear canal. What are the signs or symptoms? Symptoms of this condition include:  Reduced or muffled hearing.  A feeling of fullness in the ear or feeling that the ear is plugged.  Fluid coming from the ear.  Ear pain.  Ear itch.  Ringing in the ear.  Coughing.  An obvious piece of earwax that can be seen inside the ear canal. How is this diagnosed? This condition may be diagnosed based on:  Your symptoms.  Your medical history.  An ear exam. During the exam, your health care provider will look into your ear with an instrument called an otoscope. You may have tests, including a hearing test. How is this treated? This condition may be treated by:  Using ear drops to soften the earwax.  Having the earwax removed by a health care provider. The health care provider may: ? Flush the ear with water. ? Use an instrument that has a loop on the end (curette). ? Use a suction device.  Surgery to remove the wax buildup. This may be done in severe cases. Follow these instructions at home:   Take over-the-counter and prescription medicines only as told by your health care provider.  Do not put any objects, including cotton swabs, into your ear. You can clean the opening of your ear canal with a washcloth or facial tissue.  Follow instructions from your health care  provider about cleaning your ears. Do not over-clean your ears.  Drink enough fluid to keep your urine clear or pale yellow. This will help to thin the earwax.  Keep all follow-up visits as told by your health care provider. If earwax builds up in your ears often or if you use hearing aids, consider seeing your health care provider for routine, preventive ear cleanings. Ask your health care provider how often you should schedule your cleanings.  If you have hearing aids, clean them according to instructions from the manufacturer and your health care provider. Contact a health care provider if:  You have ear pain.  You develop a fever.  You have blood, pus, or other fluid coming from your ear.  You have hearing loss.  You have ringing in your ears that does not go away.  Your symptoms do not improve with treatment.  You feel like the room is spinning (vertigo). Summary  Earwax can build up in the ear and cause discomfort or hearing loss.  The most common symptoms of this condition include reduced or muffled hearing and a feeling of fullness in the ear or feeling that the ear is plugged.  This condition may be diagnosed based on your symptoms, your medical history, and an ear exam.  This condition may be treated by using ear drops to soften the earwax or by having the earwax removed by a health care provider.  Do not put any   objects, including cotton swabs, into your ear. You can clean the opening of your ear canal with a washcloth or facial tissue. This information is not intended to replace advice given to you by your health care provider. Make sure you discuss any questions you have with your health care provider. Document Revised: 12/02/2016 Document Reviewed: 03/02/2016 [employer] Patient Education  2020 [employer]  

## 2019-05-10 NOTE — Progress Notes (Signed)
Established Patient Office Visit  Subjective:  Patient ID: Norma Clay, female    DOB: 1950/08/27  Age: 69 y.o. MRN: [id number]  CC: Follow-up diabetes, new complaint of sore throat-Norma Deen MD  Due to language barrier, [employer] video interpretation system was offered but declined by patient and son  HPI Norma Clay, 69 year old female status post visit on 03/01/2019, at which time patient had follow-up of diabetes and other chronic medical issues.  Patient's hemoglobin A1c was 7.2 and glucose of 288 on 03/01/2019.  As her A1c was near goal of 7 or less and her elevated blood sugar was also likely developed possible urinary tract infection and patient with decreased appetite secondary to abdominal pain and H. pylori infection.  She was asked at the last visit to decrease the dose of Amaryl from 4 mg once daily to 2 mg-half pill-once daily.  At today's visit, her son states that she did not make any change in her medication and continued to take the 4 mg dose of Amaryl which caused her to run out early.  She denies any issues with hypoglycemia-no episodes of feeling weak/dizzy, confused or sweaty she would like to continue Amaryl at 4 mg daily and also needs a refill of losartan for control of her blood pressure.  She denies any headaches or dizziness related to her blood pressure.  She continues to take amlodipine as well.  She denies current issues with increased thirst and no frequent urination.        She also has complaint of sore throat for the past 2 or more weeks.  She denies any fever or chills.  She has had some sensation of postnasal drainage and occasional sneezing.  She denies any loss of sense of taste or smell.  She has had no cough or shortness of breath.  She has had issues with allergic rhinitis in the past but is not currently taking any medication.  She has had some improvement of sore throat.  She has seen no white spots in the back of her throat and denies any difficulty  swallowing.  She has felt as if she has discomfort in her right ear and sensation of pressure and decreased hearing.           She reports that she did follow-up with endocrinology regarding her low calcium/hyperparathyroidism after her last visit.  She also followed up with cardiology regarding palpitations and she reports that she has not had any additional issues with palpitations since her last visit.  No recent chest pain.  No muscle cramps.  No nausea or vomiting.  She has had improvement in constipation and feels that her acid reflux symptoms are greatly improved..  Past Medical History:  Diagnosis Date  . Diabetes mellitus without complication ([place])   . Glaucoma   . Hypertension     Past Surgical History:  Procedure Laterality Date  . GLAUCOMA SURGERY    . THYROIDECTOMY      Family History  Problem Relation Age of Onset  . Diabetes Mother   . Colon cancer Neg Hx   . Stomach cancer Neg Hx   . Esophageal cancer Neg Hx     Social History   Socioeconomic History  . Marital status: Widowed    Spouse name: Not on file  . Number of children: Not on file  . Years of education: Not on file  . Highest education level: Not on file  Occupational History  . Not on file  Tobacco Use  .  Smoking status: Never Smoker  . Smokeless tobacco: Never Used  Substance and Sexual Activity  . Alcohol use: No  . Drug use: No  . Sexual activity: Not Currently  Other Topics Concern  . Not on file  Social History Narrative  . Not on file   Social Determinants of Health   Financial Resource Strain:   . Difficulty of Paying Living Expenses:   Food Insecurity:   . Worried About Charity fundraiser in the Last Year:   . Arboriculturist in the Last Year:   Transportation Needs:   . Film/video editor (Medical):   Marland Kitchen Lack of Transportation (Non-Medical):   Physical Activity:   . Days of Exercise per Week:   . Minutes of Exercise per Session:   Stress:   . Feeling of Stress :     Social Connections:   . Frequency of Communication with Friends and Family:   . Frequency of Social Gatherings with Friends and Family:   . Attends Religious Services:   . Active Member of Clubs or Organizations:   . Attends Archivist Meetings:   Marland Kitchen Marital Status:   Intimate Partner Violence:   . Fear of Current or Ex-Partner:   . Emotionally Abused:   Marland Kitchen Physically Abused:   . Sexually Abused:     Outpatient Medications Prior to Visit  Medication Sig Dispense Refill  . amLODipine (NORVASC) 5 MG tablet Take 1 tablet (5 mg total) by mouth daily. To lower blood pressure 90 tablet 3  . atorvastatin (LIPITOR) 20 MG tablet Take 1 tablet (20 mg total) by mouth daily. 90 tablet 2  . Bismuth Subsalicylate 599 MG TABS Take 1 tablet (262 mg total) by mouth in the morning, at noon, in the evening, and at bedtime. 54 tablet 0  . Blood Glucose Monitoring Suppl (TRUE METRIX METER) w/Device KIT 1 each by Does not apply route 2 (two) times daily. 1 kit 0  . calcitRIOL (ROCALTROL) 0.25 MCG capsule Take 2 capsules (0.5 mcg total) by mouth daily. 180 capsule 3  . calcium carbonate (OSCAL) 1500 (600 Ca) MG TABS tablet Take 2 tablets (3,000 mg total) by mouth 2 (two) times daily with a meal. 360 tablet 1  . Cholecalciferol (VITAMIN D3) 10 MCG (400 UNIT) tablet Take 1 tablet (400 Units total) by mouth daily. 90 tablet 1  . diclofenac Sodium (VOLTAREN) 1 % GEL RUB 2 GRAMS INTO AFFECTED AREA OF FOOT 2 TO 4 TIMES DAILY 100 g 2  . dorzolamide-timolol (COSOPT) 22.3-6.8 MG/ML ophthalmic solution Place 1 drop into both eyes 2 (two) times daily. 10 mL 0  . glimepiride (AMARYL) 4 MG tablet Decrease to 1/2 pill once per day before first meal of the day 45 tablet 0  . glucose blood (TRUE METRIX BLOOD GLUCOSE TEST) test strip Use as instructed to check blood sugars once per day 100 each 4  . levofloxacin (LEVAQUIN) 250 MG tablet Take 1 tablet (250 mg total) by mouth daily. For bladder infection 5 tablet 0  .  levothyroxine (SYNTHROID) 100 MCG tablet 1 pill once daily.  Do not eat for 30 minutes after taking p.o. 90 tablet 1  . losartan (COZAAR) 25 MG tablet Take 1 tablet (25 mg total) by mouth daily. 90 tablet 2  . metFORMIN (GLUCOPHAGE) 850 MG tablet Take 1 tablet (850 mg total) by mouth 2 (two) times daily with a meal. 180 tablet 2  . metroNIDAZOLE (FLAGYL) 500 MG tablet Take 1 tablet (  500 mg total) by mouth 4 (four) times daily. 54 tablet 0  . Netarsudil Dimesylate 0.02 % SOLN Apply 1 drop to eye every evening.    . nystatin cream (MYCOSTATIN) Apply 1 application topically 2 (two) times daily. X 10 days then as needed 30 g 2  . omeprazole (PRILOSEC) 40 MG capsule Take 1 capsule (40 mg total) by mouth 2 (two) times daily. 60 capsule 0  . pantoprazole (PROTONIX) 40 MG tablet Take 1 tablet (40 mg total) by mouth 2 (two) times daily. 28 tablet 0  . tetracycline (SUMYCIN) 500 MG capsule Take 1 capsule (500 mg total) by mouth 4 (four) times daily. 54 capsule 0  . Travoprost, BAK Free, (TRAVATAN Z) 0.004 % SOLN ophthalmic solution Apply 1 drop to eye at bedtime.    . TRUEPLUS LANCETS 28G MISC Use to check blood sugar up to 3 times daily. 100 each 11   No facility-administered medications prior to visit.    Allergies  Allergen Reactions  . Clindamycin/Lincomycin Rash  . Penicillins Rash    Patient was placed on a form of penicillin by her dentist and developed a rash therefore medication was changed to clindamycin.  Per son, patient is not allergic to clindamycin    ROS Review of Systems  Constitutional: Positive for fatigue. Negative for chills and fever.  HENT: Positive for congestion, ear pain, postnasal drip, rhinorrhea and sore throat. Negative for trouble swallowing.   Eyes: Positive for visual disturbance (wears glasses). Negative for photophobia.  Respiratory: Negative for cough and shortness of breath.   Cardiovascular: Negative for chest pain and palpitations.  Gastrointestinal: Positive  for constipation (improved). Negative for abdominal pain, blood in stool, diarrhea and nausea.  Genitourinary: Negative for dysuria and frequency.  Musculoskeletal: Negative for arthralgias and back pain.  Neurological: Negative for dizziness and headaches.  Hematological: Negative for adenopathy. Does not bruise/bleed easily.      Objective:    Physical Exam  Constitutional: She is oriented to person, place, and time. She appears well-developed and well-nourished.  WNWD elderly female in NAD sitting on exam table and she is accompanied by her son who acts as interpreter  HENT:  Right ear with dry impacted cerumen; left canal with partial obstruction due to dry cerumen build-up and left TM is pale. Nares with pink edematous nasal turbinates with mild clear to white nasal discharge. Patient with mild posterior pharynx edema/erythema with cobblestoning. No pharyngeal exudate  Neck: No JVD present.  Cardiovascular: Normal rate and regular rhythm.  Pulmonary/Chest: Effort normal and breath sounds normal.  Abdominal: Soft. There is no abdominal tenderness (mild epigastric per patient during exam). There is no rebound and no guarding.  Musculoskeletal:        General: No tenderness or edema.     Cervical back: Normal range of motion and neck supple.     Comments: No CVA tenderness  Lymphadenopathy:    She has no cervical adenopathy.  Neurological: She is alert and oriented to person, place, and time.  Skin: Skin is warm and dry.  Psychiatric: She has a normal mood and affect. Her behavior is normal.  Nursing note and vitals reviewed.   BP 134/77   Pulse 96   Temp 97.7 F (36.5 C) (Temporal)   Ht '5\' 8"'  (1.727 m)   Wt 173 lb 9.6 oz (78.7 kg)   SpO2 97%   BMI 26.40 kg/m  Wt Readings from Last 3 Encounters:  05/10/19 173 lb 9.6 oz (78.7 kg)  04/17/19 176 lb (79.8 kg)  04/09/19 176 lb (79.8 kg)     Health Maintenance Due  Topic Date Due  . MAMMOGRAM  Never done  . COLONOSCOPY   Never done  . FOOT EXAM  04/18/2017  . PNA vac Low Risk Adult (2 of 2 - PPSV23) 04/18/2017    Lab Results  Component Value Date   TSH 1.090 02/08/2019   Lab Results  Component Value Date   WBC 7.4 02/08/2019   HGB 12.5 02/08/2019   HCT 37.0 02/08/2019   MCV 92 02/08/2019   PLT 219 02/08/2019   Lab Results  Component Value Date   NA 137 04/25/2019   K 4.0 04/25/2019   CO2 28 04/25/2019   GLUCOSE 196 (H) 04/25/2019   BUN 29 (H) 04/25/2019   CREATININE 0.98 04/25/2019   BILITOT 0.6 04/17/2019   ALKPHOS 94 04/17/2019   AST 40 04/17/2019   ALT 38 (H) 04/17/2019   PROT 6.8 04/17/2019   ALBUMIN 4.1 04/25/2019   CALCIUM 8.7 04/25/2019   ANIONGAP 14 10/30/2014   GFR 56.06 (L) 04/25/2019   Lab Results  Component Value Date   CHOL 138 04/17/2019   Lab Results  Component Value Date   HDL 68 04/17/2019   Lab Results  Component Value Date   LDLCALC 59 04/17/2019   Lab Results  Component Value Date   TRIG 50 04/17/2019   Lab Results  Component Value Date   CHOLHDL 2.0 04/17/2019   Lab Results  Component Value Date   HGBA1C 7.2 (A) 02/08/2019      Assessment & Plan:  1. Type 2 diabetes mellitus without complication, without long-term current use of insulin ([place]); language barrier Discussed with patient and son that there is a risk of hypoglycemia with use of Amaryl and since her A1c was 7.5 at last visit, dose was decreased to 2 mg but per son, patient continued to take 4 mg without difficulty.  New prescription provided for the glimepiride 4 mg but again discussed risk for hypoglycemia which could have severe consequences such as falls with potential for bone fractures at patient's age as well as risk of hypoglycemic coma.  Patient should make sure that she does not take the medication until just before eating and should not take the medication if she has not eaten or does not plan to eat soon after taking the medication.  Continue close monitoring of blood sugars.   Patient and son declined video interpretation system and son acted as interpreter at today's visit. - glimepiride (AMARYL) 4 MG tablet; One pill once per day before a meal  Dispense: 90 tablet; Refill: 1  2. Essential hypertension Blood pressure controlled on current losartan 25 mg and amlodipine 5 mg which was refilled today's visit. - amLODipine (NORVASC) 5 MG tablet; Take 1 tablet (5 mg total) by mouth daily. To lower blood pressure  Dispense: 90 tablet; Refill: 3 - losartan (COZAAR) 25 MG tablet; Take 1 tablet (25 mg total) by mouth daily.  Dispense: 90 tablet; Refill: 3  3. Allergic rhinitis, unspecified seasonality, unspecified trigger Patient with complaint of congestion with postnasal drainage and sore throat.  She does report some improvement in symptoms.  No signs of exudate on exam.  Prescription provided for loratadine which should not affect the patient's blood pressure and should help with sensation of postnasal drainage.  Call or return if sore throat continues. - loratadine (CLARITIN) 10 MG tablet; Take 1 tablet (10 mg total) by mouth daily. As  needed for nasal congestion/drainage  Dispense: 30 tablet; Refill: 11  4. Right ear pain 5. Impacted cerumen of right ear Patient with complaint of pain in the right ear and she has cerumen impaction on exam.  Ear lavage offered but patient wishes to try the use of over-the-counter earwax drops to dissolve wax.  She should call or return sooner if ear pain does not improve or if she has any worsening of ear pain.  She should also take prescribed loratadine in case eustachian tube dysfunction is also contributing to ear pain.  Educational information on earwax buildup/cerumen impaction given to the patient as part of after visit summary.  An After Visit Summary was printed and given to the patient.   Follow-up: Return in about 2 months (around 07/10/2019) for DM/chronic issues- sooner if needed.  Approximately 20 minutes of face-to-face time  spent with the patient and her son at today's visit obtaining recent and current medical history, review of systems, performance of examination, assessment of issues and treatment plan which were then discussed with the patient and son.  An additional 14 minutes was spent on pre and post visit review of chart including recent specialty visits/labs and completion of today's visit note.  Antony Blackbird, MD

## 2019-05-20 MED FILL — AMLODIPINE BESYLATE 5 MG TA: 5 | 30 days supply | Qty: 30 | Fill #6

## 2019-05-20 MED FILL — LOSARTAN POTASSIUM 25 MG TA: 25 | 30 days supply | Qty: 30 | Fill #0

## 2019-05-20 MED FILL — GLIMEPIRIDE 4 MG TABS: 4 | 30 days supply | Qty: 30 | Fill #0

## 2019-05-21 MED FILL — TRUE METRIX TEST STRIP: 30 days supply | Qty: 100 | Fill #1

## 2019-05-28 MED FILL — PREDNISOLONE AC 1% EYE DROP: 1 | 12 days supply | Qty: 10 | Fill #0

## 2019-06-02 ENCOUNTER — Encounter: Payer: Self-pay | Admitting: Family Medicine

## 2019-06-06 MED FILL — LOSARTAN POTASSIUM 25 MG TA: 25 | 30 days supply | Qty: 30 | Fill #1

## 2019-06-06 MED FILL — TRAVATAN Z 0.004 % SOLN: 0.004 | 18 days supply | Qty: 3 | Fill #0

## 2019-06-06 MED FILL — AMLODIPINE BESYLATE 5 MG TA: 5 | 30 days supply | Qty: 30 | Fill #7

## 2019-06-06 MED FILL — DORZOLAMIDE-TIMOLOL EYE DRP: 22.3-6.8 | 40 days supply | Qty: 10 | Fill #8

## 2019-06-06 MED FILL — LEVOTHYROXINE SODIUM 100 MC: 100 | 30 days supply | Qty: 30 | Fill #2

## 2019-06-06 MED FILL — GLIMEPIRIDE 4 MG TABS: 4 | 30 days supply | Qty: 30 | Fill #1

## 2019-06-06 MED FILL — metFORMIN HCL 850 MG TABS: 850 | 90 days supply | Qty: 180 | Fill #0

## 2019-06-06 MED FILL — ?ATORVASTATIN 20 MG TABLET: 20 | 30 days supply | Qty: 30 | Fill #4

## 2019-06-12 ENCOUNTER — Telehealth: Payer: Self-pay | Admitting: Physical Medicine and Rehabilitation

## 2019-06-12 NOTE — Telephone Encounter (Signed)
Ok with quick eval but may need to See Dr. Roda Clay if no help. Make sure no trauma or other issues - length of time makes me want to saw F/up Dr. Roda Clay if anything not straightforward

## 2019-06-12 NOTE — Telephone Encounter (Signed)
Patient's son came into the clinic wanting to schedule an appointment with Dr. Alvester Morin.  His name is [facility], [address]  Thank you.

## 2019-06-12 NOTE — Telephone Encounter (Signed)
Pt would like to have a right hip injection, pt had last injection 10/2017 that helped out a lot. Ok to repeat if no new injuries/traumas.

## 2019-06-14 NOTE — Telephone Encounter (Signed)
Pt is schedule for 07/02/19 for ov with possible hip inj

## 2019-06-14 NOTE — Telephone Encounter (Signed)
Left message #1

## 2019-06-25 MED FILL — TETRACYCLINE HCL 500 MG CAP: 500 | 13 days supply | Qty: 54 | Fill #0

## 2019-06-25 MED FILL — metroNIDAZOLE 500 MG TABS: 500 | 13 days supply | Qty: 54 | Fill #0

## 2019-06-27 ENCOUNTER — Other Ambulatory Visit: Payer: Self-pay | Admitting: Surgery

## 2019-06-28 MED FILL — PREDNISOLONE AC 1% EYE DROP: 1 | 37 days supply | Qty: 5 | Fill #0

## 2019-06-28 MED FILL — TRAVATAN Z 0.004 % SOLN: 0.004 | 18 days supply | Qty: 3 | Fill #0

## 2019-07-01 MED FILL — LOSARTAN POTASSIUM 25 MG TA: 25 | 30 days supply | Qty: 30 | Fill #2

## 2019-07-01 MED FILL — ?AMLODIPINE BESYLATE 5MG TA: 5 | 30 days supply | Qty: 30 | Fill #8

## 2019-07-01 MED FILL — LEVOTHYROXINE SODIUM 100 MC: 100 | 30 days supply | Qty: 30 | Fill #3

## 2019-07-01 MED FILL — GLIMEPIRIDE 4 MG TABS: 4 | 30 days supply | Qty: 30 | Fill #2

## 2019-07-01 MED FILL — ?ATORVASTATIN 20 MG TABLET: 20 | 30 days supply | Qty: 30 | Fill #5

## 2019-07-01 MED FILL — CALCITRIOL 0.25 MCG CAPS: 0.25 | 30 days supply | Qty: 60 | Fill #5

## 2019-07-02 ENCOUNTER — Other Ambulatory Visit: Payer: Self-pay

## 2019-07-02 ENCOUNTER — Ambulatory Visit (INDEPENDENT_AMBULATORY_CARE_PROVIDER_SITE_OTHER): Payer: Self-pay | Admitting: Physical Medicine and Rehabilitation

## 2019-07-02 ENCOUNTER — Encounter: Payer: Self-pay | Admitting: Physical Medicine and Rehabilitation

## 2019-07-02 ENCOUNTER — Ambulatory Visit: Payer: Self-pay

## 2019-07-02 VITALS — BP 139/81 | HR 88 | Ht 63.0 in | Wt 180.0 lb

## 2019-07-02 DIAGNOSIS — W19XXXS Unspecified fall, sequela: Secondary | ICD-10-CM

## 2019-07-02 DIAGNOSIS — M25551 Pain in right hip: Secondary | ICD-10-CM

## 2019-07-02 DIAGNOSIS — M545 Low back pain: Secondary | ICD-10-CM

## 2019-07-02 DIAGNOSIS — Y92009 Unspecified place in unspecified non-institutional (private) residence as the place of occurrence of the external cause: Secondary | ICD-10-CM

## 2019-07-02 DIAGNOSIS — G8929 Other chronic pain: Secondary | ICD-10-CM

## 2019-07-02 MED FILL — TRUE METRIX TEST STRIP: 30 days supply | Qty: 100 | Fill #2

## 2019-07-02 NOTE — Progress Notes (Signed)
Pt states pain in the right hip (groin pain) and some pain in the right buttocks. Pt states pain started a month ago. Pt states last injection 2019 helped out a lot. Bending makes pain worse. Injections helps with pain. Pt states she fell in March 2021 and fell on her bottom.   .Numeric Pain Rating Scale and Functional Assessment Average Pain 5 Pain Right Now 5 My pain is intermittent and aching Pain is worse with: bending Pain improves with: injections   In the last MONTH (on 0-10 scale) has pain interfered with the following?  1. General activity like being  able to carry out your everyday physical activities such as walking, climbing stairs, carrying groceries, or moving a chair?  Rating(8)  2. Relation with others like being able to carry out your usual social activities and roles such as  activities at home, at work and in your community. Rating(8)  3. Enjoyment of life such that you have  been bothered by emotional problems such as feeling anxious, depressed or irritable?  Rating(6)

## 2019-07-03 ENCOUNTER — Encounter: Payer: Self-pay | Admitting: Physical Medicine and Rehabilitation

## 2019-07-03 MED ORDER — BUPIVACAINE HCL 0.25 % IJ SOLN
4.0000 mL | INTRAMUSCULAR | Status: AC | PRN
  Administered 2019-07-02: 4 mL via INTRA_ARTICULAR

## 2019-07-03 MED ORDER — TRIAMCINOLONE ACETONIDE 40 MG/ML IJ SUSP
60.0000 mg | INTRAMUSCULAR | Status: AC | PRN
  Administered 2019-07-02: 60 mg via INTRA_ARTICULAR

## 2019-07-03 NOTE — Progress Notes (Signed)
Norma Clay - 69 y.o. female MRN [id number]  Date of birth: 07-03-1950  Office Visit Note: Visit Date: 07/02/2019 PCP: Cain Saupe, MD Referred by: Cain Saupe, MD  Subjective: Chief Complaint  Patient presents with  . Right Hip - Pain   HPI:  Norma Clay is a 69 y.o. female who comes in today  for planned repeat right anesthetic hip arthrogram with fluoroscopic guidance.  The patient has failed conservative care including home exercise, medications, time and activity modification.  This injection will be diagnostic and hopefully therapeutic.  Please see requesting physician notes for further details and justification.  We do have an interpreter today and patient reports a ground-level fall in March down to her buttocks.  No fractures or any real trouble at that point but since that time continued right hip and groin pain.  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  2 prior injections have given her quite a bit of relief fortunately.  Those injections were performed in 2018 and 2019.  She has not had follow-up in quite some time with Dr. Glee Arvin.  Her symptoms are still consistent with right hip pathology and consistent on exam today.  She has no red flag complaints.  Referred by: Dr. Glee Arvin   Review of Systems  Musculoskeletal: Positive for joint pain.  All other systems reviewed and are negative.  Otherwise per HPI.  Assessment & Plan: Visit Diagnoses:  1. Pain in right hip   2. Chronic right-sided low back pain without sciatica   3. Fall as cause of accidental injury in home as place of occurrence, sequela     Plan: Findings:  Several weeks of flareup of right buttock and groin pain consistent with her known end-stage arthritis of the right hip.  Ground-level fall to the buttock without any history of fracture.  Reviewed fluoroscopic imaging today while doing right hip injection and there seem to be no fractures etc.  She will continue with  current conservative care.  She can follow-up with Dr. Glee Arvin should things worsen or not improve.  She has no red flag complaints or any red flag issues on exam.  Her situation is complicated by diabetes.    Meds & Orders: No orders of the defined types were placed in this encounter.   Orders Placed This Encounter  Procedures  . Large Joint Inj  . XR C-ARM NO REPORT    Follow-up: No follow-ups on file.   Procedures: Large Joint Inj: R hip joint on 07/02/2019 10:30 AM Indications: diagnostic evaluation and pain Details: 22 G 3.5 in needle, fluoroscopy-guided anterior approach  Arthrogram: No  Medications: 4 mL bupivacaine 0.25 %; 60 mg triamcinolone acetonide 40 MG/ML Outcome: tolerated well, no immediate complications  There was excellent flow of contrast producing a partial arthrogram of the hip. The patient did have relief of symptoms during the anesthetic phase of the injection. Procedure, treatment alternatives, risks and benefits explained, specific risks discussed. Consent was given by the patient. Immediately prior to procedure a time out was called to verify the correct patient, procedure, equipment, support staff and site/side marked as required. Patient was prepped and draped in the usual sterile fashion.      No notes on file   Clinical History: No specialty comments available.     Objective:  VS:  HT:5\' 3"  (160 cm)   WT:180 lb (81.6 kg)  BMI:31.89    BP:139/81  HR:88bpm  TEMP: ( )  RESP:  Physical Exam Constitutional:      General: She is not in acute distress.    Appearance: Normal appearance. She is not ill-appearing.  HENT:     Head: Normocephalic and atraumatic.     Right Ear: External ear normal.     Left Ear: External ear normal.  Eyes:     Extraocular Movements: Extraocular movements intact.  Cardiovascular:     Rate and Rhythm: Normal rate.     Pulses: Normal pulses.  Musculoskeletal:     Right lower leg: No edema.     Left lower leg: No  edema.     Comments: Patient has good distal strength with no pain over the greater trochanters.  No clonus or focal weakness.  She does have concordant right hip pain with internal rotation.  She does have limited range of motion.  Skin:    Findings: No erythema, lesion or rash.  Neurological:     General: No focal deficit present.     Mental Status: She is alert and oriented to person, place, and time.     Sensory: No sensory deficit.     Motor: No weakness or abnormal muscle tone.     Coordination: Coordination normal.  Psychiatric:        Mood and Affect: Mood normal.        Behavior: Behavior normal.      Imaging: XR C-ARM NO REPORT  Result Date: 07/02/2019 Please see Notes tab for imaging impression.

## 2019-07-10 MED FILL — DORZOLAMIDE-TIMOLOL EYE DRP: 22.3-6.8 | 37 days supply | Qty: 10 | Fill #0

## 2019-07-26 ENCOUNTER — Ambulatory Visit (INDEPENDENT_AMBULATORY_CARE_PROVIDER_SITE_OTHER): Payer: Self-pay | Admitting: Internal Medicine

## 2019-07-26 ENCOUNTER — Other Ambulatory Visit: Payer: Self-pay

## 2019-07-26 ENCOUNTER — Encounter: Payer: Self-pay | Admitting: Internal Medicine

## 2019-07-26 VITALS — BP 136/68 | HR 78 | Ht 63.0 in | Wt 170.6 lb

## 2019-07-26 DIAGNOSIS — E892 Postprocedural hypoparathyroidism: Secondary | ICD-10-CM

## 2019-07-26 NOTE — Progress Notes (Signed)
Name: Norma Clay  MRN/ DOB: [id number], [birth year]    Age/ Sex: 69 y.o., female     PCP: Antony Blackbird, MD   Reason for Endocrinology Evaluation: Post-surgical hypoparathyroidism     Initial Endocrinology Clinic Visit: 12/08/17    PATIENT IDENTIFIER: [name] is a 69 y.o., female with a past medical history of Total thyroidectomy and T2DM. She has followed with [facility] since 12/08/2017 for consultative assistance with management of her Post-surgical hypoparathyroidism.   HISTORICAL SUMMARY:  She  moved from [place] in 2019. 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 She denies history of thyroid cancer. Pt noted hand spasms following 2nd surgery and has been on Calcium and Calcitriol since. Historically has compliance issues which results in serum calcium fluctuations.  24-hr urine collection normal 06/2018  SUBJECTIVE:   Today (07/26/2019):  [name] is here with her son Norma Clay. She is here for a follow up on hypocalcemia.   Today she denies any shortness of breath, she denies any numbness or perioral tingling.  Occasional tingling of the hand  No constipation    Medication: Calcium carbonate (oyster shell) 500 mg, 2 tabs BID- pt has been taking 1 tablet TID  Calcitriol 0.25 MCG, 2 caps daily Vitamin D3 400 IU daily     HISTORY:  Past Medical History:  Past Medical History:  Diagnosis Date  . Diabetes mellitus without complication ([place])   . Glaucoma   . Hypertension    Past Surgical History:  Past Surgical History:  Procedure Laterality Date  . GLAUCOMA SURGERY    . THYROIDECTOMY      Social History:  reports that she has never smoked. She has never used smokeless tobacco. She reports that she does not drink alcohol and does not use drugs.  Family History: family history includes Diabetes in her mother.   HOME MEDICATIONS: Allergies as of 07/26/2019        Reactions   Clindamycin/lincomycin Rash   Penicillins Rash   Patient was placed on a form of penicillin by her dentist and developed a rash therefore medication was changed to clindamycin.  Per son, patient is not allergic to clindamycin      Medication List       Accurate as of July 26, 2019  4:11 PM. If you have any questions, ask your nurse or doctor.        amLODipine 5 MG tablet Commonly known as: NORVASC Take 1 tablet (5 mg total) by mouth daily. To lower blood pressure   atorvastatin 20 MG tablet Commonly known as: LIPITOR Take 1 tablet (20 mg total) by mouth daily.   Bismuth Subsalicylate 376 MG Tabs Take 1 tablet (262 mg total) by mouth in the morning, at noon, in the evening, and at bedtime.   calcitRIOL 0.25 MCG capsule Commonly known as: ROCALTROL Take 2 capsules (0.5 mcg total) by mouth daily.   calcium carbonate 1500 (600 Ca) MG Tabs tablet Commonly known as: OSCAL Take 2 tablets (3,000 mg total) by mouth 2 (two) times daily with a meal.   diclofenac Sodium 1 % Gel Commonly known as: VOLTAREN RUB 2 GRAMS INTO AFFECTED AREA OF FOOT 2 TO 4 TIMES DAILY   dorzolamide-timolol 22.3-6.8 MG/ML ophthalmic solution Commonly known as: COSOPT Place 1 drop into both eyes 2 (two) times daily.   glimepiride 4 MG tablet Commonly known as: AMARYL One pill once per  day before a meal   levothyroxine 100 MCG tablet Commonly known as: SYNTHROID 1 pill once daily.  Do not eat for 30 minutes after taking p.o.   loratadine 10 MG tablet Commonly known as: CLARITIN Take 1 tablet (10 mg total) by mouth daily. As needed for nasal congestion/drainage   losartan 25 MG tablet Commonly known as: COZAAR Take 1 tablet (25 mg total) by mouth daily.   metFORMIN 850 MG tablet Commonly known as: GLUCOPHAGE Take 1 tablet (850 mg total) by mouth 2 (two) times daily with a meal.   metroNIDAZOLE 500 MG tablet Commonly known as: FLAGYL Take 1 tablet (500 mg total) by mouth 4  (four) times daily.   Netarsudil Dimesylate 0.02 % Soln Apply 1 drop to eye every evening.   nystatin cream Commonly known as: MYCOSTATIN Apply 1 application topically 2 (two) times daily. X 10 days then as needed   omeprazole 40 MG capsule Commonly known as: PRILOSEC Take 1 capsule (40 mg total) by mouth 2 (two) times daily.   pantoprazole 40 MG tablet Commonly known as: PROTONIX Take 1 tablet (40 mg total) by mouth 2 (two) times daily.   tetracycline 500 MG capsule Commonly known as: SUMYCIN Take 1 capsule (500 mg total) by mouth 4 (four) times daily.   Travatan Z 0.004 % Soln ophthalmic solution Generic drug: Travoprost (BAK Free) Apply 1 drop to eye at bedtime.   True Metrix Blood Glucose Test test strip Generic drug: glucose blood Use as instructed to check blood sugars once per day   True Metrix Meter w/Device Kit 1 each by Does not apply route 2 (two) times daily.   TRUEplus Lancets 28G Misc Use to check blood sugar up to 3 times daily.   Vitamin D3 10 MCG (400 UNIT) tablet Take 1 tablet (400 Units total) by mouth daily.         OBJECTIVE:   PHYSICAL EXAM: VS: BP (!) 136/68 (BP Location: Left Arm, Patient Position: Sitting, Cuff Size: Normal)   Pulse 78   Ht '5\' 3"'  (1.6 m)   Wt 170 lb 9.6 oz (77.4 kg)   SpO2 98%   BMI 30.22 kg/m    EXAM: General: Pt appears well and is in NAD  Lungs: Clear with good BS bilat with no rales, rhonchi, or wheezes  Heart: Auscultation: RRR.  Extremities: BL LE: Trace pretibial edema   Mental Status: Judgment, insight: Intact Mood and affect: No depression, anxiety, or agitation     DATA REVIEWED:   Results for Norma Clay (MRN [id number]) as of 07/29/2019 12:12  Ref. Range 07/26/2019 16:31  Sodium Latest Ref Range: 135 - 146 mmol/L 138  Potassium Latest Ref Range: 3.5 - 5.3 mmol/L 4.1  Chloride Latest Ref Range: 98 - 110 mmol/L 103  CO2 Latest Ref Range: 20 - 32 mmol/L 29  Glucose Latest Ref Range: 65 - 99  mg/dL 141 (H)  BUN Latest Ref Range: 7 - 25 mg/dL 26 (H)  Creatinine Latest Ref Range: 0.60 - 0.93 mg/dL 0.85  Calcium Latest Ref Range: 8.6 - 10.4 mg/dL 8.4 (L)  BUN/Creatinine Ratio Latest Ref Range: 6 - 22 (calc) 31 (H)  Vitamin D, 25-Hydroxy Latest Ref Range: 30 - 100 ng/mL 28 (L)  WBC Latest Ref Range: 3.8 - 10.8 Thousand/uL 6.1  RBC Latest Ref Range: 3.80 - 5.10 Million/uL 3.79 (L)  Hemoglobin Latest Ref Range: 11.7 - 15.5 g/dL 11.8  HCT Latest Ref Range: 35 - 45 % 35.6  MCV Latest  Ref Range: 80.0 - 100.0 fL 93.9  MCH Latest Ref Range: 27.0 - 33.0 pg 31.1  MCHC Latest Ref Range: 32.0 - 36.0 g/dL 33.1  RDW Latest Ref Range: 11.0 - 15.0 % 13.2  Platelets Latest Ref Range: 140 - 400 Thousand/uL 207  MPV Latest Ref Range: 7.5 - 12.5 fL 10.7     ASSESSMENT / PLAN / RECOMMENDATIONS:   1. Surgical Hypoparathyroidism :    - Pt is asymptomatic  - Calcium level is acceptable at 8.4 mg/dl which is at goal  - On her last visit she was accompanied by her daughter and we discussed obtaining her supplements from the community clinic due to difference in elemental calcium based on calcium brand, but today she states she received a a calcium  shipment from [place] and has been using it. Will not change her dose at this time, but the calcium level is going to fluctuate depending on what brand of calcium supplements she uses and compliance.    Medications:   Calcium Carbonate 500 mg 1 tabs TID Continue Calcitriol 0.5 mcg daily  (0.25 mcg x2) Continue Vitamin D3 400 iu daily         F/u in 4 months    Signed electronically by: Mack Guise, MD  [facility]  [address], [place] [address] Phone: [phone] FAX: [phone]      CC: Antony Blackbird, MD [place] [address] Phone: [phone]  Fax: [phone]   Return to Endocrinology clinic as below: Future Appointments  Date Time  Provider [place]  08/14/2019 10:30 AM Cameron Sprang, MD [facility] None

## 2019-07-26 NOTE — Patient Instructions (Addendum)
-   Continue Calcium Carbonate  1 tablet three times daily for now  - Continue Calcitriol two capsules a day  - Continue Over the counter Vitamin D3 400 iu daily

## 2019-07-27 LAB — CBC
HCT: 35.6 % (ref 35.0–45.0)
Hemoglobin: 11.8 g/dL (ref 11.7–15.5)
MCH: 31.1 pg (ref 27.0–33.0)
MCHC: 33.1 g/dL (ref 32.0–36.0)
MCV: 93.9 fL (ref 80.0–100.0)
MPV: 10.7 fL (ref 7.5–12.5)
Platelets: 207 10*3/uL (ref 140–400)
RBC: 3.79 10*6/uL — ABNORMAL LOW (ref 3.80–5.10)
RDW: 13.2 % (ref 11.0–15.0)
WBC: 6.1 10*3/uL (ref 3.8–10.8)

## 2019-07-27 LAB — BASIC METABOLIC PANEL
BUN/Creatinine Ratio: 31 (calc) — ABNORMAL HIGH (ref 6–22)
BUN: 26 mg/dL — ABNORMAL HIGH (ref 7–25)
CO2: 29 mmol/L (ref 20–32)
Calcium: 8.4 mg/dL — ABNORMAL LOW (ref 8.6–10.4)
Chloride: 103 mmol/L (ref 98–110)
Creat: 0.85 mg/dL (ref 0.60–0.93)
Glucose, Bld: 141 mg/dL — ABNORMAL HIGH (ref 65–99)
Potassium: 4.1 mmol/L (ref 3.5–5.3)
Sodium: 138 mmol/L (ref 135–146)

## 2019-07-27 LAB — VITAMIN D 25 HYDROXY (VIT D DEFICIENCY, FRACTURES): Vit D, 25-Hydroxy: 28 ng/mL — ABNORMAL LOW (ref 30–100)

## 2019-07-29 MED ORDER — CALCIUM CARBONATE 1500 (600 CA) MG PO TABS: 600.0000 mg | ORAL_TABLET | Freq: Three times a day (TID) | ORAL | 1 refills | Status: DC

## 2019-08-05 MED FILL — PREDNISOLONE AC 1% EYE DROP: 1 | 37 days supply | Qty: 5 | Fill #1

## 2019-08-09 MED FILL — LOSARTAN POTASSIUM 25 MG TA: 25 | 30 days supply | Qty: 30 | Fill #3

## 2019-08-09 MED FILL — GLIMEPIRIDE 4 MG TABS: 4 | 30 days supply | Qty: 30 | Fill #3

## 2019-08-09 MED FILL — ?ATORVASTATIN 20 MG TABLET: 20 | 30 days supply | Qty: 30 | Fill #6

## 2019-08-09 MED FILL — ?AMLODIPINE BESYLATE 5MG TA: 5 | 30 days supply | Qty: 30 | Fill #9

## 2019-08-09 MED FILL — TRAVATAN Z 0.004 % SOLN: 0.004 | 18 days supply | Qty: 3 | Fill #1

## 2019-08-09 MED FILL — LEVOTHYROXINE SODIUM 100 MC: 100 | 30 days supply | Qty: 30 | Fill #4

## 2019-08-09 MED FILL — CALCITRIOL 0.25 MCG CAPS: 0.25 | 30 days supply | Qty: 60 | Fill #6

## 2019-08-09 MED FILL — DORZOLAMIDE HCL-TIMOLOL MAL: 22.3-6.8 | 40 days supply | Qty: 10 | Fill #9

## 2019-08-14 ENCOUNTER — Ambulatory Visit: Payer: Self-pay | Admitting: Neurology

## 2019-08-14 ENCOUNTER — Telehealth: Payer: Self-pay | Admitting: Family Medicine

## 2019-08-14 NOTE — Telephone Encounter (Signed)
Patients son stopped in the office and requested for her neurological referral to be placed at [facility] [address] [phone]. Please inform the patients son at your earliest convenience.

## 2019-08-14 NOTE — Telephone Encounter (Signed)
Noted  Sent Referral to [facility]  Thank you .

## 2019-08-21 ENCOUNTER — Other Ambulatory Visit: Payer: Self-pay

## 2019-08-21 ENCOUNTER — Ambulatory Visit: Payer: Self-pay

## 2019-08-28 ENCOUNTER — Encounter: Payer: Self-pay | Admitting: Family Medicine

## 2019-08-28 ENCOUNTER — Ambulatory Visit (INDEPENDENT_AMBULATORY_CARE_PROVIDER_SITE_OTHER): Payer: Self-pay | Admitting: Family Medicine

## 2019-08-28 ENCOUNTER — Other Ambulatory Visit: Payer: Self-pay

## 2019-08-28 DIAGNOSIS — M545 Low back pain, unspecified: Secondary | ICD-10-CM

## 2019-08-28 DIAGNOSIS — M1612 Unilateral primary osteoarthritis, left hip: Secondary | ICD-10-CM

## 2019-08-28 MED ORDER — TRAMADOL HCL 50 MG PO TABS: 25.0000 mg | ORAL_TABLET | Freq: Three times a day (TID) | ORAL | 0 refills | Status: DC | PRN

## 2019-08-28 MED FILL — traMADol HCL 50 MG TABS: 50 | 10 days supply | Qty: 30 | Fill #0

## 2019-08-28 NOTE — Progress Notes (Signed)
I saw and examined the patient with Dr. Marga Hoots and agree with assessment and plan as outlined.    Ongoing left hip pain with underlying DJD, didn't get much relief from injection per Dr. Alvester Morin.  Recently fell on buttocks and has had increased left hip pain, and also low back pain.  No radicular symptoms.    Exam reveals moderate diffuse soft tissue tenderness in lumbar area, no severe bony tenderness.  Hip still hurts with passive IR.  Neuro exam intact.  Will try PT, with tramadol as needed.  If fails to improve, then X-Rays and MRI of lumbar spine.

## 2019-08-28 NOTE — Progress Notes (Signed)
Office Visit Note   Patient: Norma Clay           Date of Birth: 1950-06-28           MRN: [id number] Visit Date: 08/28/2019 Requested by: Cain Saupe, MD [address] [place],  [place] [address] PCP: Cain Saupe, MD  Subjective: Chief Complaint  Patient presents with  . Lower Back - Pain    Chronic pain in the lower back, worse on the left side. Pain in the knees and lower legs. Occasional numbness/tingling in the lower legs. Fell 4 days ago - knees gave way. ESI from Dr. Alvester Morin in June did not help.    HPI: 69 year old female presenting to clinic today with concerns of acute on chronic low back and left hip pain.  Patient states that she tripped over the end of her dress 4 days ago, landing on her buttocks.  She was able to get up after the fall, but since this time she has had worsening left hip pain as well as bilateral lower back pain.  She has been seen for pain in the past, and recently underwent intra-articular hip injection.  Patient denies any significant bruising after the incident, and states that she has not been taking any medications for pain at home.  She has never done physical therapy for her hip or back pain, though she does express interest in this.  She states that she would like to get stronger to stop herself from falling so frequently.  She denies numbness or tingling in either leg, and no bowel or bladder dysfunction.  She does state that she has had bilateral leg cramping, though this predates the fall.               ROS:   All other systems were reviewed and are negative.  Objective: Vital Signs: There were no vitals taken for this visit.  Physical Exam:  General:  Alert and oriented, in no acute distress. Pulm:  Breathing unlabored. Psy:  Normal mood, congruent affect. Skin: No bruising or rashes appreciated. BACK EXAMINATION: Ambulates with cane assistance. Favors Left leg.  Excessive thoracic kyphosis.   ROM: No worsening of her pain with  forward flexion, and able to nearly achieve toe-touch. Does state pain worsens with extension. Palpation: No midline tenderness, no deformity or step-offs. Endorses bilateral lower lumbar paraspinal muscle tenderness, as well as significant tenderness over the SI Joints bilaterally (L>R). Gluteus musculature and piriformis without tender points.  Strength:  Foot Inversion (L4), Dorsiflexion (L5), and Eversion (S1) 5/5 on Left Sensation: Intact to light touch medial and lateral aspects of lower extremities. Special Tests:  SLR: No radiation down Ipilateral or contralateral leg bilaterally  Hip: Endorses pain with log roll of the left leg. Deep groin pain with FADIR.     Imaging: None Today.  Assessment & Plan: 69 year old female presenting to clinic with acute on chronic left hip and lower back pain, due to mechanical fall approximately 4 days ago.  Examination as above, with no red flags.   -Suspect patient would be a good candidate for physical therapy to strengthen her core, and support bilateral hips-and this may have the additional benefit of reducing her frequent falls at home.   -Patient is agreeable to the plan of physical therapy, and referral has been placed for her today.  -We will provide small amount of tramadol for acute pain control, while she establishes care with physical therapy. -Patient had no further questions or concerns at  the completion of today's visit.      Procedures: No procedures performed  No notes on file     PMFS History: Patient Active Problem List   Diagnosis Date Noted  . 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  . Essential hypertension 02/19/2019  . Hyperlipidemia 02/19/2019  . Palpitations 02/19/2019  . Shortness of breath 02/19/2019  . Hypocalcemia 12/03/2018  . Other hypoparathyroidism (HCC) 12/03/2018  . Pseudophakia of both eyes 04/23/2018  . Posterior capsular opacification of both eyes, 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  .  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 Hypoparathyroidism (HCC) 12/03/2017  . Osteoarthritis of right hip 05/16/2016  . Diabetes mellitus without complication (HCC) 04/18/2016   Past Medical History:  Diagnosis Date  . Diabetes mellitus without complication (HCC)   . Glaucoma   . Hypertension     Family History  Problem Relation Age of Onset  . Diabetes Mother   . Colon cancer Neg Hx   . Stomach cancer Neg Hx   . Esophageal cancer Neg Hx     Past Surgical History:  Procedure Laterality Date  . GLAUCOMA SURGERY    . THYROIDECTOMY     Social History   Occupational History  . Not on file  Tobacco Use  . Smoking status: Never Smoker  . Smokeless tobacco: Never Used  Vaping Use  . Vaping Use: Never used  Substance and Sexual Activity  . Alcohol use: No  . Drug use: No  . Sexual activity: Not Currently

## 2019-09-03 MED FILL — CALCITRIOL 0.25 MCG CAPS: 0.25 | 30 days supply | Qty: 60 | Fill #7

## 2019-09-03 MED FILL — TRUE METRIX TEST STRIP: 30 days supply | Qty: 100 | Fill #3

## 2019-09-03 MED FILL — DORZOLAMIDE-TIMOLOL EYE DRP: 22.3-6.8 | 37 days supply | Qty: 10 | Fill #1

## 2019-09-03 MED FILL — LEVOTHYROXINE SODIUM 100 MC: 100 | 30 days supply | Qty: 30 | Fill #5

## 2019-09-03 MED FILL — ?AMLODIPINE BESYLATE 5MG TA: 5 | 30 days supply | Qty: 30 | Fill #10

## 2019-09-03 MED FILL — ?METFORMIN HCL 850 MG TABLE: 850 | 90 days supply | Qty: 180 | Fill #1

## 2019-09-03 MED FILL — PREDNISOLONE AC 1% EYE DROP: 1 | 30 days supply | Qty: 5 | Fill #2

## 2019-09-03 MED FILL — LOSARTAN POTASSIUM 25 MG TA: 25 | 30 days supply | Qty: 30 | Fill #4

## 2019-09-03 MED FILL — TRAVATAN Z 0.004 % SOLN: 0.004 | 18 days supply | Qty: 3 | Fill #2

## 2019-09-03 MED FILL — ?ATORVASTATIN 20 MG TABLET: 20 | 30 days supply | Qty: 30 | Fill #7

## 2019-09-24 ENCOUNTER — Other Ambulatory Visit: Payer: Self-pay

## 2019-09-24 ENCOUNTER — Ambulatory Visit: Payer: Self-pay | Attending: Family Medicine

## 2019-09-24 DIAGNOSIS — M25551 Pain in right hip: Secondary | ICD-10-CM | POA: Insufficient documentation

## 2019-09-24 DIAGNOSIS — M545 Low back pain, unspecified: Secondary | ICD-10-CM

## 2019-09-24 DIAGNOSIS — R293 Abnormal posture: Secondary | ICD-10-CM | POA: Insufficient documentation

## 2019-09-24 DIAGNOSIS — M6281 Muscle weakness (generalized): Secondary | ICD-10-CM | POA: Insufficient documentation

## 2019-09-24 DIAGNOSIS — G8929 Other chronic pain: Secondary | ICD-10-CM | POA: Insufficient documentation

## 2019-09-24 NOTE — Therapy (Signed)
[facility] [address] [place], [place], [address] Phone: [phone]   Fax:  [phone]  Physical Therapy Evaluation  Patient Details  Name: Norma Clay MRN: [id number] Date of Birth: October 06, 1950 Referring Provider (PT): Lavada Mesi, MD   Encounter Date: 09/24/2019   PT End of Session - 09/24/19 1655    Visit Number 1    Number of Visits 12    Date for PT Re-Evaluation 11/05/19    PT Start Time 1600   pt arrived late   PT Stop Time 1633    PT Time Calculation (min) 33 min    Activity Tolerance Patient tolerated treatment well;Patient limited by pain    Behavior During Therapy [facility] for tasks assessed/performed           Past Medical History:  Diagnosis Date  . Diabetes mellitus without complication (HCC)   . Glaucoma   . Hypertension     Past Surgical History:  Procedure Laterality Date  . GLAUCOMA SURGERY    . THYROIDECTOMY      There were no vitals filed for this visit.    Subjective Assessment - 09/24/19 1603    Subjective Via interpreter, pt has fallen many times which caused back pain. She has fallen 2x in 4 months, which exacerbated her back pain. She uses quad cane for balance. Standing and walking makes back pain worse. She has trouble turning in bed due to pain in R hip joint. This is all started with [place] in the [place] 5 year ago.    Patient is accompained by: Interpreter   Nuha   Pertinent History Falls, Hysterectomy    Limitations Standing;Walking;Lifting;House hold activities    Patient Stated Goals Reduce pain    Currently in Pain? Yes    Pain Score 5     Pain Location Back    Pain Orientation Lower    Pain Descriptors / Indicators Aching;Sore    Pain Type Chronic pain    Pain Radiating Towards R hip    Pain Onset More than a month ago    Pain Frequency Intermittent    Aggravating Factors  standing to do kitchen work, walking    Pain Relieving Factors ointment, sitting (not low chair)      Effect of Pain on Daily Activities Standing for ADLs/IADLs              [facility] PT Assessment - 09/24/19 0001      Assessment   Medical Diagnosis LBP, R Hip OA    Referring Provider (PT) Lavada Mesi, MD    Onset Date/Surgical Date --   5 years ago   Hand Dominance Right    Next MD Visit After PT    Prior Therapy Yes      Balance Screen   Has the patient fallen in the past 6 months Yes    How many times? 2x    Has the patient had a decrease in activity level because of a fear of falling?  Yes    Is the patient reluctant to leave their home because of a fear of falling?  --   unable to ascertain     Home Environment   Living Environment Private residence    Living Arrangements Children;Other relatives    Home Layout Two level;Bed/bath upstairs    Additional Comments Daughter and family, son and family      Prior Function   Level of Independence Independent    Leisure helping around the house  Posture/Postural Control   Posture Comments forward flexed, unable to achieve neutral hip extension, leaning on quad cane      ROM / Strength   AROM / PROM / Strength AROM;Strength      AROM   AROM Assessment Site Lumbar    Lumbar Flexion 45   pain   Lumbar Extension 8   pain   Lumbar - Right Side Bend 15    Lumbar - Left Side Bend 12   pain   Lumbar - Right Rotation 75% decreased    Lumbar - Left Rotation 75% decreased      Strength   Strength Assessment Site Hip;Knee    Right/Left Hip Right;Left    Right Hip Flexion 4-/5    Left Hip Flexion 4/5    Right/Left Knee Right;Left    Right Knee Flexion 4-/5    Right Knee Extension 4-/5   pain   Left Knee Flexion 4/5    Left Knee Extension 4/5      Palpation   Spinal mobility hypomobile L/S and T/S 2/6    Palpation comment TTP with any touch to lumbar spine, lower thoracic, lumbar paraspinals      Special Tests    Special Tests Lumbar    Lumbar Tests FABER test;Straight Leg Raise      FABER test   findings  Positive    Side Right    Comment unclear whether hip or back pain      Straight Leg Raise   Findings Negative                      Objective measurements completed on examination: See above findings.               PT Education - 09/24/19 1654    Education Details Diagnosis, Prognosis, HEP, POC    Person(s) Educated Patient;Child(ren)    Methods Explanation;Demonstration;Tactile cues;Verbal cues;Handout   via interpreter   Comprehension Other (comment);Returned demonstration;Verbalized understanding;Tactile cues required;Need further instruction;Verbal cues required   via interpreter           PT Short Term Goals - 09/24/19 1703      PT SHORT TERM GOAL #1   Title Pt will be I and compliant with initial HEP.    Time 3    Period Weeks    Status New    Target Date 10/15/19      PT SHORT TERM GOAL #2   Title Pt will report ability to stand for 10 minutes to help around the house.    Time 3    Period Weeks    Status New    Target Date 10/15/19             PT Long Term Goals - 09/24/19 1704      PT LONG TERM GOAL #1   Title Pt will be I and compliant with long term HEP for progression and maintenance of condition.    Time 6    Period Weeks    Status New    Target Date 11/05/19      PT LONG TERM GOAL #2   Title Pt will demo at least 4/5 hip MMT for improved stability of pelvis and lumbar spine.    Time 6    Period Weeks    Status New    Target Date 11/05/19      PT LONG TERM GOAL #3   Title Pt will score 45/56 on Berg Balance test, in  order to demonstrate decreased fall risk.    Time 6    Period Weeks    Status New    Target Date 11/05/19      PT LONG TERM GOAL #4   Title Pt will report decrease in pain by 50%, in order to participate in leisure activities more frequently.    Time 6    Period Weeks    Status New    Target Date 11/05/19      PT LONG TERM GOAL #5   Title Pt will be able to stand for up to 20 minutes, in order to  perform ADLs/IADLs.    Time 6    Period Weeks    Status New    Target Date 11/05/19                  Plan - 09/24/19 1655    Clinical Impression Statement Pt is a 69 yo female with low back pain and mobility deficits, as well as primary R hip OA. She ambulates with quad cane and very forward flexed posture. She was unable to tolerate ROM testing on R hip without pain, and it was difficult to ascertain full functional level due to language barrier and time constraint. Will continue to assess R hip and further functional mobility next visit. She demonstrates impairments in posture, balance, gait, lumbar and hip ROM, and strength. She and on were educated on diagnosis, prognosis, POC, and HEP, verbalizing understanding and consent to tx. She will benefit from skilled PT 2x/week for 6 weeks to address impairments and improve functional mobility.    Personal Factors and Comorbidities Age;Comorbidity 3+;Time since onset of injury/illness/exacerbation;Past/Current Experience;Fitness    Comorbidities HTN, DM, Glaucoma    Examination-Activity Limitations Lift;Squat;Bend;Caring for Others;Stand;Reach Overhead;Locomotion Level    Examination-Participation Restrictions Interpersonal Relationship;Cleaning;Laundry;Meal Prep;Community Activity    Stability/Clinical Decision Making Evolving/Moderate complexity    Clinical Decision Making Moderate    Rehab Potential Good    PT Frequency 2x / week    PT Duration 6 weeks    PT Treatment/Interventions ADLs/Self Care Home Management;Moist Heat;Electrical Stimulation;Cryotherapy;Joint Manipulations;Spinal Manipulations;Passive range of motion;Dry needling;Manual techniques;Patient/family education;Therapeutic exercise;Balance training;Neuromuscular re-education;Therapeutic activities;Functional mobility training;Gait training    PT Next Visit Plan FOTO, Assess response to HEP, continue to assess R hip, hip and core strength, balance    PT Home Exercise Plan  [id number]: H/L Clams with RTB, SKTC, Figure 4 S, S/L Thoracic Rot'n           Patient will benefit from skilled therapeutic intervention in order to improve the following deficits and impairments:  Difficulty walking, Decreased balance, Decreased activity tolerance, Decreased strength, Increased fascial restricitons, Impaired flexibility, Postural dysfunction, Pain, Improper body mechanics, Impaired perceived functional ability, Decreased range of motion, Hypomobility  Visit Diagnosis: Chronic bilateral low back pain without sciatica - Plan: PT plan of care cert/re-cert  Abnormal posture - Plan: PT plan of care cert/re-cert  Pain in right hip - Plan: PT plan of care cert/re-cert  Muscle weakness (generalized) - Plan: PT plan of care cert/re-cert     Problem List Patient Active Problem List   Diagnosis Date Noted  . 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  . Essential hypertension 02/19/2019  . Hyperlipidemia 02/19/2019  . Palpitations 02/19/2019  . Shortness of breath 02/19/2019  . Hypocalcemia 12/03/2018  . Other hypoparathyroidism (HCC) 12/03/2018  . Pseudophakia of both eyes 04/23/2018  . Posterior capsular opacification of both eyes, 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  . 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 Hypoparathyroidism (HCC) 12/03/2017  . Osteoarthritis of right hip 05/16/2016  . Diabetes mellitus without complication (HCC) 04/18/2016    Marcelline MatesKathryn M Nickie Deren, PT, DPT 09/24/2019, 5:10 PM  [facility] [address] [place], [place], [id number] Phone: [phone]   Fax:  [phone]  Name: Norma Clay MRN: [id number] Date of Birth: 05/22/1950

## 2019-09-25 MED FILL — PREDNISOLONE AC 1% EYE DROP: 1 | 30 days supply | Qty: 5 | Fill #3

## 2019-09-30 ENCOUNTER — Encounter: Payer: Self-pay | Admitting: Neurology

## 2019-10-07 ENCOUNTER — Other Ambulatory Visit: Payer: Self-pay

## 2019-10-07 ENCOUNTER — Other Ambulatory Visit (HOSPITAL_COMMUNITY): Payer: Self-pay | Admitting: Urgent Care

## 2019-10-07 ENCOUNTER — Ambulatory Visit (HOSPITAL_COMMUNITY)
Admission: EM | Admit: 2019-10-07 | Discharge: 2019-10-07 | Disposition: A | Discharge status: Home / Self Care | Payer: Self-pay | Attending: Urgent Care | Admitting: Urgent Care

## 2019-10-07 ENCOUNTER — Encounter (HOSPITAL_COMMUNITY): Payer: Self-pay

## 2019-10-07 DIAGNOSIS — R300 Dysuria: Secondary | ICD-10-CM | POA: Insufficient documentation

## 2019-10-07 DIAGNOSIS — R519 Headache, unspecified: Secondary | ICD-10-CM

## 2019-10-07 DIAGNOSIS — R0982 Postnasal drip: Secondary | ICD-10-CM | POA: Insufficient documentation

## 2019-10-07 DIAGNOSIS — R1030 Lower abdominal pain, unspecified: Secondary | ICD-10-CM | POA: Insufficient documentation

## 2019-10-07 DIAGNOSIS — N3001 Acute cystitis with hematuria: Secondary | ICD-10-CM | POA: Insufficient documentation

## 2019-10-07 DIAGNOSIS — R070 Pain in throat: Secondary | ICD-10-CM | POA: Insufficient documentation

## 2019-10-07 DIAGNOSIS — R109 Unspecified abdominal pain: Secondary | ICD-10-CM

## 2019-10-07 LAB — POCT URINALYSIS DIPSTICK, ED / UC
Bilirubin Urine: NEGATIVE
Glucose, UA: 250 mg/dL — AB
Ketones, ur: NEGATIVE mg/dL
Nitrite: NEGATIVE
Protein, ur: NEGATIVE mg/dL
Specific Gravity, Urine: 1.02 (ref 1.005–1.030)
Urobilinogen, UA: 0.2 mg/dL (ref 0.0–1.0)
pH: 7 (ref 5.0–8.0)

## 2019-10-07 MED ORDER — CETIRIZINE HCL 10 MG PO TABS: 10.0000 mg | ORAL_TABLET | Freq: Every day | ORAL | 0 refills | Status: DC

## 2019-10-07 MED ORDER — ACETAMINOPHEN 325 MG PO TABS: 325.0000 mg | ORAL_TABLET | Freq: Four times a day (QID) | ORAL | 0 refills | Status: DC | PRN

## 2019-10-07 MED ORDER — CEPHALEXIN 500 MG PO CAPS: 500.0000 mg | ORAL_CAPSULE | Freq: Two times a day (BID) | ORAL | 0 refills | Status: DC

## 2019-10-07 NOTE — ED Provider Notes (Signed)
Jay   MRN: [id number] DOB: 1950-09-04  Subjective:   Norma Clay is a 70 y.o. female presenting for several day history of urinary frequency, dysuria, lower abdominal pain.  Has also had throat pain, cough.  Patient is fully vaccinated.  She presents with a family member that is refusing a COVID-19 test for her.  No current facility-administered medications for this encounter.  Current Outpatient Medications:  .  amLODipine (NORVASC) 5 MG tablet, Take 1 tablet (5 mg total) by mouth daily. To lower blood pressure, Disp: 90 tablet, Rfl: 3 .  atorvastatin (LIPITOR) 20 MG tablet, Take 1 tablet (20 mg total) by mouth daily., Disp: 90 tablet, Rfl: 2 .  Bismuth Subsalicylate 170 MG TABS, Take 1 tablet (262 mg total) by mouth in the morning, at noon, in the evening, and at bedtime., Disp: 54 tablet, Rfl: 0 .  Blood Glucose Monitoring Suppl (TRUE METRIX METER) w/Device KIT, 1 each by Does not apply route 2 (two) times daily., Disp: 1 kit, Rfl: 0 .  calcitRIOL (ROCALTROL) 0.25 MCG capsule, Take 2 capsules (0.5 mcg total) by mouth daily., Disp: 180 capsule, Rfl: 3 .  calcium carbonate (OSCAL) 1500 (600 Ca) MG TABS tablet, Take 1 tablet (1,500 mg total) by mouth 3 (three) times daily with meals., Disp: 360 tablet, Rfl: 1 .  Cholecalciferol (VITAMIN D3) 10 MCG (400 UNIT) tablet, Take 1 tablet (400 Units total) by mouth daily., Disp: 90 tablet, Rfl: 1 .  diclofenac Sodium (VOLTAREN) 1 % GEL, RUB 2 GRAMS INTO AFFECTED AREA OF FOOT 2 TO 4 TIMES DAILY, Disp: 100 g, Rfl: 2 .  dorzolamide-timolol (COSOPT) 22.3-6.8 MG/ML ophthalmic solution, Place 1 drop into both eyes 2 (two) times daily., Disp: 10 mL, Rfl: 0 .  glimepiride (AMARYL) 4 MG tablet, One pill once per day before a meal, Disp: 90 tablet, Rfl: 1 .  glucose blood (TRUE METRIX BLOOD GLUCOSE TEST) test strip, Use as instructed to check blood sugars once per day, Disp: 100 each, Rfl: 4 .  levothyroxine (SYNTHROID) 100 MCG  tablet, 1 pill once daily.  Do not eat for 30 minutes after taking p.o., Disp: 90 tablet, Rfl: 1 .  loratadine (CLARITIN) 10 MG tablet, Take 1 tablet (10 mg total) by mouth daily. As needed for nasal congestion/drainage, Disp: 30 tablet, Rfl: 11 .  losartan (COZAAR) 25 MG tablet, Take 1 tablet (25 mg total) by mouth daily., Disp: 90 tablet, Rfl: 3 .  metFORMIN (GLUCOPHAGE) 850 MG tablet, Take 1 tablet (850 mg total) by mouth 2 (two) times daily with a meal., Disp: 180 tablet, Rfl: 2 .  metroNIDAZOLE (FLAGYL) 500 MG tablet, Take 1 tablet (500 mg total) by mouth 4 (four) times daily. (Patient not taking: Reported on 07/26/2019), Disp: 54 tablet, Rfl: 0 .  Netarsudil Dimesylate 0.02 % SOLN, Apply 1 drop to eye every evening., Disp: , Rfl:  .  nystatin cream (MYCOSTATIN), Apply 1 application topically 2 (two) times daily. X 10 days then as needed, Disp: 30 g, Rfl: 2 .  omeprazole (PRILOSEC) 40 MG capsule, Take 1 capsule (40 mg total) by mouth 2 (two) times daily., Disp: 60 capsule, Rfl: 0 .  pantoprazole (PROTONIX) 40 MG tablet, Take 1 tablet (40 mg total) by mouth 2 (two) times daily., Disp: 28 tablet, Rfl: 0 .  tetracycline (SUMYCIN) 500 MG capsule, Take 1 capsule (500 mg total) by mouth 4 (four) times daily. (Patient not taking: Reported on 07/26/2019), Disp: 54 capsule, Rfl: 0 .  traMADol (ULTRAM) 50 MG tablet, Take 0.5-1 tablets (25-50 mg total) by mouth 3 (three) times daily as needed., Disp: 30 tablet, Rfl: 0 .  Travoprost, BAK Free, (TRAVATAN Z) 0.004 % SOLN ophthalmic solution, Apply 1 drop to eye at bedtime., Disp: , Rfl:  .  TRUEPLUS LANCETS 28G MISC, Use to check blood sugar up to 3 times daily., Disp: 100 each, Rfl: 11   Allergies  Allergen Reactions  . Clindamycin/Lincomycin Rash  . Penicillins Rash    Patient was placed on a form of penicillin by her dentist and developed a rash therefore medication was changed to clindamycin.  Per son, patient is not allergic to clindamycin    Past  Medical History:  Diagnosis Date  . Diabetes mellitus without complication ([place])   . Glaucoma   . Hypertension      Past Surgical History:  Procedure Laterality Date  . GLAUCOMA SURGERY    . THYROIDECTOMY      Family History  Problem Relation Age of Onset  . Diabetes Mother   . Colon cancer Neg Hx   . Stomach cancer Neg Hx   . Esophageal cancer Neg Hx     Social History   Tobacco Use  . Smoking status: Never Smoker  . Smokeless tobacco: Never Used  Vaping Use  . Vaping Use: Never used  Substance Use Topics  . Alcohol use: No  . Drug use: No    ROS   Objective:   Vitals: BP (!) 172/75 (BP Location: Right Arm)   Pulse (!) 103   Temp 98.6 F (37 C) (Oral)   Resp 16   SpO2 94%   Physical Exam Constitutional:      General: She is not in acute distress.    Appearance: Normal appearance. She is well-developed and normal weight. She is not ill-appearing, toxic-appearing or diaphoretic.  HENT:     Head: Normocephalic and atraumatic.     Right Ear: External ear normal.     Left Ear: External ear normal.     Nose: Nose normal.     Mouth/Throat:     Mouth: Mucous membranes are moist.     Pharynx: No oropharyngeal exudate or posterior oropharyngeal erythema.     Comments: Significant post-nasal drainage.  Eyes:     General: No scleral icterus.       Right eye: No discharge.        Left eye: No discharge.     Extraocular Movements: Extraocular movements intact.     Conjunctiva/sclera: Conjunctivae normal.     Pupils: Pupils are equal, round, and reactive to light.  Cardiovascular:     Rate and Rhythm: Normal rate and regular rhythm.     Pulses: Normal pulses.     Heart sounds: Normal heart sounds. No murmur heard.  No friction rub. No gallop.   Pulmonary:     Effort: Pulmonary effort is normal. No respiratory distress.     Breath sounds: Normal breath sounds. No stridor. No wheezing, rhonchi or rales.  Abdominal:     General: Bowel sounds are normal. There  is no distension.     Palpations: Abdomen is soft. There is no mass.     Tenderness: There is abdominal tenderness (generalized throughout). There is no right CVA tenderness, left CVA tenderness, guarding or rebound.  Skin:    General: Skin is warm and dry.     Coloration: Skin is not pale.     Findings: No rash.  Neurological:  General: No focal deficit present.     Mental Status: She is alert and oriented to person, place, and time.  Psychiatric:        Mood and Affect: Mood normal.        Behavior: Behavior normal.        Thought Content: Thought content normal.        Judgment: Judgment normal.     Results for orders placed or performed during the hospital encounter of 10/07/19 (from the past 24 hour(s))  POC Urinalysis dipstick     Status: Abnormal   Collection Time: 10/07/19  6:57 PM  Result Value Ref Range   Glucose, UA 250 (A) NEGATIVE mg/dL   Bilirubin Urine NEGATIVE NEGATIVE   Ketones, ur NEGATIVE NEGATIVE mg/dL   Specific Gravity, Urine 1.020 1.005 - 1.030   Hgb urine dipstick MODERATE (A) NEGATIVE   pH 7.0 5.0 - 8.0   Protein, ur NEGATIVE NEGATIVE mg/dL   Urobilinogen, UA 0.2 0.0 - 1.0 mg/dL   Nitrite NEGATIVE NEGATIVE   Leukocytes,Ua LARGE (A) NEGATIVE    Assessment and Plan :   PDMP not reviewed this encounter.  1. Acute cystitis with hematuria   2. Lower abdominal pain   3. Dysuria   4. Post-nasal drainage   5. Throat pain     Patient in family member refuse COVID-19 test.  Counseled that her symptoms may very well be due to Covid but they still refuse.  She does have signs and symptoms consistent with acute cystitis.  Start Keflex for this, recommended aggressive hydration.  Use supportive care otherwise. Counseled patient on potential for adverse effects with medications prescribed/recommended today, ER and return-to-clinic precautions discussed, patient verbalized understanding.    Jaynee Eagles, PA-C 10/07/19 1920

## 2019-10-07 NOTE — ED Triage Notes (Signed)
Pt presents with headache and generalized abdominal pain for past few days.

## 2019-10-07 NOTE — Discharge Instructions (Signed)
Make sure you hydrate very well with plain water and a quantity of 64 ounces of water a day.  Please limit drinks that are considered urinary irritants such as soda, sweet tea, coffee, energy drinks, alcohol.  These can worsen your UTI symptoms and also be the source of them.  I will let you know about your urine culture results through MyChart to see if we need to change your antibiotics based off of those results.  You can use Zyrtec (cetirizine) for post-nasal drainage that is causing your throat pain. Do not use any nonsteroidal anti-inflammatories (NSAIDs) like ibuprofen, Motrin, naproxen, Aleve, etc. which are all available over-the-counter.  Please just use Tylenol at a dose of 500mg -650mg  once every 6 hours as needed for your aches, pains, fevers.

## 2019-10-08 LAB — URINE CULTURE

## 2019-10-08 MED FILL — ?CETIRIZINE HCL 10 MG TABLE: 10 | 30 days supply | Qty: 30 | Fill #0

## 2019-10-08 MED FILL — CEPHALEXIN 500 MG CAPSULE: 500 | 7 days supply | Qty: 14 | Fill #0

## 2019-10-10 ENCOUNTER — Other Ambulatory Visit: Payer: Self-pay | Admitting: Family Medicine

## 2019-10-10 ENCOUNTER — Ambulatory Visit: Payer: Self-pay | Admitting: Rehabilitative and Restorative Service Providers"

## 2019-10-10 DIAGNOSIS — E039 Hypothyroidism, unspecified: Secondary | ICD-10-CM

## 2019-10-10 MED FILL — ATORVASTATIN CALCIUM 20 MG: 20 | 30 days supply | Qty: 30 | Fill #8

## 2019-10-10 MED FILL — GLIMEPIRIDE 4 MG TABS: 4 | 30 days supply | Qty: 30 | Fill #4

## 2019-10-10 MED FILL — LEVOTHYROXINE SODIUM 100 MC: 100 | 30 days supply | Qty: 30 | Fill #0

## 2019-10-10 MED FILL — CALCITRIOL 0.25 MCG CAPS: 0.25 | 30 days supply | Qty: 60 | Fill #8

## 2019-10-10 MED FILL — ?AMLODIPINE BESYLATE 5MG TA: 5 | 30 days supply | Qty: 30 | Fill #11

## 2019-10-10 MED FILL — LOSARTAN POTASSIUM 25 MG TA: 25 | 30 days supply | Qty: 30 | Fill #5

## 2019-10-10 NOTE — Telephone Encounter (Signed)
Requested Prescriptions  Pending Prescriptions Disp Refills   levothyroxine (SYNTHROID) 100 MCG tablet [Pharmacy Med Name: LEVOTHYROXINE SODIUM 100 MC 100 Tablet] 90 tablet 1    Sig: TAKE 1 TABLET BY MOUTH ONCE A DAY. DO NOT EAT FOR 30 MINUTES AFTER TAKING     Endocrinology:  Hypothyroid Agents Failed - 10/10/2019  9:01 AM      Failed - TSH needs to be rechecked within 3 months after an abnormal result. Refill until TSH is due.      Passed - TSH in normal range and within 360 days    TSH  Date Value Ref Range Status  02/08/2019 1.090 0.450 - 4.500 uIU/mL Final         Passed - Valid encounter within last 12 months    Recent Outpatient Visits          5 months ago Allergic rhinitis, unspecified seasonality, unspecified trigger   [facility] Fulp, Mark, MD   7 months ago Diabetes mellitus without complication (HCC)   [facility] Fulp, [place], MD   8 months ago Diabetes mellitus without complication (HCC)   [facility] Fulp, [place], MD   11 months ago Type 2 diabetes mellitus without complication, without long-term current use of insulin (HCC)   [facility] Fulp, [place], MD   1 year ago Diabetes mellitus without complication [facility])   [facility] [place], [place], MD

## 2019-10-15 ENCOUNTER — Ambulatory Visit: Payer: Self-pay | Attending: Family Medicine

## 2019-10-15 ENCOUNTER — Other Ambulatory Visit: Payer: Self-pay

## 2019-10-15 DIAGNOSIS — M545 Low back pain, unspecified: Secondary | ICD-10-CM | POA: Insufficient documentation

## 2019-10-15 DIAGNOSIS — R293 Abnormal posture: Secondary | ICD-10-CM | POA: Insufficient documentation

## 2019-10-15 DIAGNOSIS — M25551 Pain in right hip: Secondary | ICD-10-CM | POA: Insufficient documentation

## 2019-10-15 DIAGNOSIS — G8929 Other chronic pain: Secondary | ICD-10-CM | POA: Insufficient documentation

## 2019-10-15 DIAGNOSIS — M6281 Muscle weakness (generalized): Secondary | ICD-10-CM | POA: Insufficient documentation

## 2019-10-15 NOTE — Therapy (Signed)
[facility] [address] [place], [place], [address] Phone: [phone]   Fax:  [phone]  Physical Therapy Treatment  Patient Details  Name: Norma Clay MRN: [id number] Date of Birth: [birth year] Referring Provider (PT): Lavada Mesi, MD   Encounter Date: 10/15/2019   PT End of Session - 10/15/19 1659    Visit Number 2    Number of Visits 12    Date for PT Re-Evaluation 11/05/19    PT Start Time 1652    PT Stop Time 1730    PT Time Calculation (min) 38 min    Activity Tolerance Patient tolerated treatment well;Patient limited by pain    Behavior During Therapy [facility] for tasks assessed/performed           Past Medical History:  Diagnosis Date  . Diabetes mellitus without complication (HCC)   . Glaucoma   . Hypertension     Past Surgical History:  Procedure Laterality Date  . GLAUCOMA SURGERY    . THYROIDECTOMY      There were no vitals filed for this visit.   Subjective Assessment - 10/15/19 1657    Subjective Pt reports her back has felt a little bit better. She is on meds for UTI and throat.    Patient is accompained by: Interpreter   Nuha   Pertinent History Falls, Hysterectomy    Limitations Standing;Walking;Lifting;House hold activities    Patient Stated Goals Reduce pain    Currently in Pain? No/denies    Pain Onset More than a month ago                             [facility] Adult PT Treatment/Exercise - 10/15/19 0001      Exercises   Exercises Lumbar      Lumbar Exercises: Stretches   Lower Trunk Rotation 5 reps;10 seconds      Lumbar Exercises: Aerobic   Nustep L4 UE/LE 5'      Lumbar Exercises: Seated   Sit to Stand 10 reps    Sit to Stand Limitations c quad cane and close SBA      Lumbar Exercises: Supine   Pelvic Tilt 15 reps    Pelvic Tilt Limitations very difficult    Bridge Limitations attempted x 5, compensated with upper back       Lumbar Exercises: Sidelying    Clam Both;20 reps    Clam Limitations 2 sets                    PT Short Term Goals - 09/24/19 1703      PT SHORT TERM GOAL #1   Title Pt will be I and compliant with initial HEP.    Time 3    Period Weeks    Status New    Target Date 10/15/19      PT SHORT TERM GOAL #2   Title Pt will report ability to stand for 10 minutes to help around the house.    Time 3    Period Weeks    Status New    Target Date 10/15/19             PT Long Term Goals - 09/24/19 1704      PT LONG TERM GOAL #1   Title Pt will be I and compliant with long term HEP for progression and maintenance of condition.    Time 6    Period Weeks  Status New    Target Date 11/05/19      PT LONG TERM GOAL #2   Title Pt will demo at least 4/5 hip MMT for improved stability of pelvis and lumbar spine.    Time 6    Period Weeks    Status New    Target Date 11/05/19      PT LONG TERM GOAL #3   Title Pt will score 45/56 on Berg Balance test, in order to demonstrate decreased fall risk.    Time 6    Period Weeks    Status New    Target Date 11/05/19      PT LONG TERM GOAL #4   Title Pt will report decrease in pain by 50%, in order to participate in leisure activities more frequently.    Time 6    Period Weeks    Status New    Target Date 11/05/19      PT LONG TERM GOAL #5   Title Pt will be able to stand for up to 20 minutes, in order to perform ADLs/IADLs.    Time 6    Period Weeks    Status New    Target Date 11/05/19                 Plan - 10/15/19 1703    Clinical Impression Statement using iPad interpreter, pt able to participate in session. Pt had difficulty following instructions, so PT demonstrated a lot. Pt had poor tolerance for R hip  stretching, but fair for clamshells. She did have to stop due to R hip pain, and was additionally unable to perform bridging.    Personal Factors and Comorbidities Age;Comorbidity 3+;Time since onset of  injury/illness/exacerbation;Past/Current Experience;Fitness    Comorbidities HTN, DM, Glaucoma    Examination-Activity Limitations Lift;Squat;Bend;Caring for Others;Stand;Reach Overhead;Locomotion Level    Examination-Participation Restrictions Interpersonal Relationship;Cleaning;Laundry;Meal Prep;Community Activity    Stability/Clinical Decision Making Evolving/Moderate complexity    Rehab Potential Good    PT Frequency 2x / week    PT Duration 6 weeks    PT Treatment/Interventions ADLs/Self Care Home Management;Moist Heat;Electrical Stimulation;Cryotherapy;Joint Manipulations;Spinal Manipulations;Passive range of motion;Dry needling;Manual techniques;Patient/family education;Therapeutic exercise;Balance training;Neuromuscular re-education;Therapeutic activities;Functional mobility training;Gait training    PT Next Visit Plan FOTO, Assess response to HEP, continue to assess R hip, hip and core strength, balance    PT Home Exercise Plan [id number]: H/L Clams with RTB, SKTC, Figure 4 S, S/L Thoracic Rot'n           Patient will benefit from skilled therapeutic intervention in order to improve the following deficits and impairments:  Difficulty walking, Decreased balance, Decreased activity tolerance, Decreased strength, Increased fascial restricitons, Impaired flexibility, Postural dysfunction, Pain, Improper body mechanics, Impaired perceived functional ability, Decreased range of motion, Hypomobility  Visit Diagnosis: Chronic bilateral low back pain without sciatica  Abnormal posture  Pain in right hip  Muscle weakness (generalized)     Problem List Patient Active Problem List   Diagnosis Date Noted  . 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  . Essential hypertension 02/19/2019  . Hyperlipidemia 02/19/2019  . Palpitations 02/19/2019  . Shortness of breath 02/19/2019  . Hypocalcemia 12/03/2018  . Other hypoparathyroidism (HCC) 12/03/2018  . Pseudophakia of both eyes  04/23/2018  . Posterior capsular opacification of both eyes, 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  . 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 Hypoparathyroidism (  HCC) 12/03/2017  . Osteoarthritis of right hip 05/16/2016  . Diabetes mellitus without complication (HCC) 04/18/2016    Marcelline Mates, PT, DPT 10/15/2019, 5:51 PM  [facility] [address] [place], [place], [address] Phone: [phone]   Fax:  [phone]  Name: Norma Clay MRN: [id number] Date of Birth: Feb 04, 1950

## 2019-10-16 MED FILL — DORZOLAMIDE-TIMOLOL EYE DRP: 22.3-6.8 | 37 days supply | Qty: 10 | Fill #2

## 2019-10-16 MED FILL — TRAVATAN Z 0.004 % SOLN: 0.004 | 18 days supply | Qty: 3 | Fill #3

## 2019-10-18 ENCOUNTER — Ambulatory Visit: Payer: Self-pay

## 2019-10-18 ENCOUNTER — Other Ambulatory Visit: Payer: Self-pay

## 2019-10-18 DIAGNOSIS — R293 Abnormal posture: Secondary | ICD-10-CM

## 2019-10-18 DIAGNOSIS — G8929 Other chronic pain: Secondary | ICD-10-CM

## 2019-10-18 DIAGNOSIS — M6281 Muscle weakness (generalized): Secondary | ICD-10-CM

## 2019-10-18 DIAGNOSIS — M25551 Pain in right hip: Secondary | ICD-10-CM

## 2019-10-18 NOTE — Therapy (Signed)
[facility] Outpatient Rehabilitation [facility] [address] [place], [place], [address] Phone: [phone]   Fax:  [phone]  Physical Therapy Treatment  Patient Details  Name: Norma Clay MRN: [id number] Date of Birth: Aug 08, 1950 Referring Provider (PT): Lavada Mesi, MD   Encounter Date: 10/18/2019   PT End of Session - 10/18/19 1413    Visit Number 3    Number of Visits 12    Date for PT Re-Evaluation 11/05/19    PT Start Time 1405    PT Stop Time 1445    PT Time Calculation (min) 40 min    Activity Tolerance Patient tolerated treatment well;Patient limited by pain    Behavior During Therapy [facility] for tasks assessed/performed           Past Medical History:  Diagnosis Date  . Diabetes mellitus without complication (HCC)   . Glaucoma   . Hypertension     Past Surgical History:  Procedure Laterality Date  . GLAUCOMA SURGERY    . THYROIDECTOMY      There were no vitals filed for this visit.   Subjective Assessment - 10/18/19 1411    Subjective Pt states via interpreter that she feels a little better.    Patient is accompained by: Interpreter   Ziad [phone]   Pertinent History Falls, Hysterectomy    Limitations Standing;Walking;Lifting;House hold activities    Patient Stated Goals Reduce pain    Currently in Pain? Yes    Pain Score 7     Pain Location Back    Pain Orientation Lower    Pain Descriptors / Indicators Aching    Pain Onset More than a month ago    Multiple Pain Sites Yes    Pain Score 7    Pain Location Hip    Pain Orientation Right    Pain Descriptors / Indicators Aching                             [facility] Adult PT Treatment/Exercise - 10/18/19 0001      Lumbar Exercises: Stretches   ITB Stretch Right;2 reps;30 seconds    ITB Stretch Limitations c strap      Lumbar Exercises: Aerobic   Nustep L6 UE/LE 6'      Lumbar Exercises: Supine   Other Supine Lumbar Exercises H/L Clam GTB 10x10"    Other  Supine Lumbar Exercises 2 towel roll under knee, quad set 10x10"      Lumbar Exercises: Sidelying   Clam Right;20 reps                    PT Short Term Goals - 09/24/19 1703      PT SHORT TERM GOAL #1   Title Pt will be I and compliant with initial HEP.    Time 3    Period Weeks    Status New    Target Date 10/15/19      PT SHORT TERM GOAL #2   Title Pt will report ability to stand for 10 minutes to help around the house.    Time 3    Period Weeks    Status New    Target Date 10/15/19             PT Long Term Goals - 09/24/19 1704      PT LONG TERM GOAL #1   Title Pt will be I and compliant with long term HEP for progression and maintenance  of condition.    Time 6    Period Weeks    Status New    Target Date 11/05/19      PT LONG TERM GOAL #2   Title Pt will demo at least 4/5 hip MMT for improved stability of pelvis and lumbar spine.    Time 6    Period Weeks    Status New    Target Date 11/05/19      PT LONG TERM GOAL #3   Title Pt will score 45/56 on Berg Balance test, in order to demonstrate decreased fall risk.    Time 6    Period Weeks    Status New    Target Date 11/05/19      PT LONG TERM GOAL #4   Title Pt will report decrease in pain by 50%, in order to participate in leisure activities more frequently.    Time 6    Period Weeks    Status New    Target Date 11/05/19      PT LONG TERM GOAL #5   Title Pt will be able to stand for up to 20 minutes, in order to perform ADLs/IADLs.    Time 6    Period Weeks    Status New    Target Date 11/05/19                 Plan - 10/18/19 1413    Clinical Impression Statement Pt tolerated tx better today with improvement in pain following supine TFL/ITB strap S and towel roll supported quad sets (secondary to inability to extend R hip due to pain). Pt tolerated clams in s/l pain free today as well. Plan to progress to more standing next time.    Personal Factors and Comorbidities  Age;Comorbidity 3+;Time since onset of injury/illness/exacerbation;Past/Current Experience;Fitness    Comorbidities HTN, DM, Glaucoma    Examination-Activity Limitations Lift;Squat;Bend;Caring for Others;Stand;Reach Overhead;Locomotion Level    Examination-Participation Restrictions Interpersonal Relationship;Cleaning;Laundry;Meal Prep;Community Activity    Stability/Clinical Decision Making Evolving/Moderate complexity    Rehab Potential Good    PT Frequency 2x / week    PT Duration 6 weeks    PT Treatment/Interventions ADLs/Self Care Home Management;Moist Heat;Electrical Stimulation;Cryotherapy;Joint Manipulations;Spinal Manipulations;Passive range of motion;Dry needling;Manual techniques;Patient/family education;Therapeutic exercise;Balance training;Neuromuscular re-education;Therapeutic activities;Functional mobility training;Gait training    PT Next Visit Plan Assess response to HEP, continue to assess R hip, hip and core strength, balance; progress to standing exercises    PT Home Exercise Plan [id number]: H/L Clams with RTB, SKTC, Figure 4 S, S/L Thoracic Rot'n           Patient will benefit from skilled therapeutic intervention in order to improve the following deficits and impairments:  Difficulty walking, Decreased balance, Decreased activity tolerance, Decreased strength, Increased fascial restricitons, Impaired flexibility, Postural dysfunction, Pain, Improper body mechanics, Impaired perceived functional ability, Decreased range of motion, Hypomobility  Visit Diagnosis: Chronic bilateral low back pain without sciatica  Abnormal posture  Pain in right hip  Muscle weakness (generalized)     Problem List Patient Active Problem List   Diagnosis Date Noted  . 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  . Essential hypertension 02/19/2019  . Hyperlipidemia 02/19/2019  . Palpitations 02/19/2019  . Shortness of breath 02/19/2019  . Hypocalcemia 12/03/2018  . Other  hypoparathyroidism (HCC) 12/03/2018  . Pseudophakia of both eyes 04/23/2018  . Posterior capsular opacification of both eyes, 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  . 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 Hypoparathyroidism (HCC) 12/03/2017  . Osteoarthritis of right hip 05/16/2016  . Diabetes mellitus without complication (HCC) 04/18/2016    Marcelline Mates, PT, DPT 10/18/2019, 2:54 PM  [facility] [address] [place], [place], [address] Phone: [phone]   Fax:  [phone]  Name: Norma Clay MRN: [id number] Date of Birth: 1950-11-02

## 2019-10-23 ENCOUNTER — Ambulatory Visit: Payer: Self-pay | Admitting: Neurology

## 2019-10-23 MED FILL — PREDNISOLONE AC 1% EYE DROP: 1 | 30 days supply | Qty: 5 | Fill #4

## 2019-10-25 ENCOUNTER — Other Ambulatory Visit: Payer: Self-pay

## 2019-10-25 ENCOUNTER — Ambulatory Visit: Payer: Self-pay

## 2019-10-25 DIAGNOSIS — M25551 Pain in right hip: Secondary | ICD-10-CM

## 2019-10-25 DIAGNOSIS — M6281 Muscle weakness (generalized): Secondary | ICD-10-CM

## 2019-10-25 DIAGNOSIS — R293 Abnormal posture: Secondary | ICD-10-CM

## 2019-10-25 DIAGNOSIS — G8929 Other chronic pain: Secondary | ICD-10-CM

## 2019-10-25 NOTE — Therapy (Signed)
[facility] Outpatient Rehabilitation [facility] [address] [place], [place], [address] Phone: [phone]   Fax:  [phone]  Physical Therapy Treatment  Patient Details  Name: Norma Clay MRN: [id number] Date of Birth: March 06, 1950 Referring Provider (PT): Lavada Mesi, MD   Encounter Date: 10/25/2019   PT End of Session - 10/25/19 1410    Visit Number 4    Number of Visits 12    Date for PT Re-Evaluation 11/05/19    PT Start Time 1404    PT Stop Time 1445    PT Time Calculation (min) 41 min    Activity Tolerance Patient tolerated treatment well;Patient limited by pain    Behavior During Therapy [facility] for tasks assessed/performed           Past Medical History:  Diagnosis Date  . Diabetes mellitus without complication (HCC)   . Glaucoma   . Hypertension     Past Surgical History:  Procedure Laterality Date  . GLAUCOMA SURGERY    . THYROIDECTOMY      There were no vitals filed for this visit.   Subjective Assessment - 10/25/19 1408    Subjective Via interpeter, pt is doing well but gets L knee pain when walking down the stairs. Back pain is intermittent.    Patient is accompained by: Interpreter   Ziad [phone]   Pertinent History Falls, Hysterectomy    Limitations Standing;Walking;Lifting;House hold activities    Patient Stated Goals Reduce pain    Currently in Pain? No/denies    Pain Onset More than a month ago                             [facility] Adult PT Treatment/Exercise - 10/25/19 0001      Lumbar Exercises: Stretches   ITB Stretch --    ITB Stretch Limitations --      Lumbar Exercises: Aerobic   Nustep L5 UE/LE 5'      Lumbar Exercises: Standing   Heel Raises 10 reps    Heel Raises Limitations at sink    Other Standing Lumbar Exercises Hip Abd at sink x 10 B    Other Standing Lumbar Exercises Standing hip flexor opener at sink with pelvic tilt      Lumbar Exercises: Supine   Other Supine Lumbar Exercises --        Lumbar Exercises: Sidelying   Clam Right;20 reps    Clam Limitations Red theraband      Manual Therapy   Manual Therapy Soft tissue mobilization    Manual therapy comments L S/L    Soft tissue mobilization R STM/DTM to L/S PS, QL, R glutes                    PT Short Term Goals - 09/24/19 1703      PT SHORT TERM GOAL #1   Title Pt will be I and compliant with initial HEP.    Time 3    Period Weeks    Status New    Target Date 10/15/19      PT SHORT TERM GOAL #2   Title Pt will report ability to stand for 10 minutes to help around the house.    Time 3    Period Weeks    Status New    Target Date 10/15/19             PT Long Term Goals - 09/24/19 1704  PT LONG TERM GOAL #1   Title Pt will be I and compliant with long term HEP for progression and maintenance of condition.    Time 6    Period Weeks    Status New    Target Date 11/05/19      PT LONG TERM GOAL #2   Title Pt will demo at least 4/5 hip MMT for improved stability of pelvis and lumbar spine.    Time 6    Period Weeks    Status New    Target Date 11/05/19      PT LONG TERM GOAL #3   Title Pt will score 45/56 on Berg Balance test, in order to demonstrate decreased fall risk.    Time 6    Period Weeks    Status New    Target Date 11/05/19      PT LONG TERM GOAL #4   Title Pt will report decrease in pain by 50%, in order to participate in leisure activities more frequently.    Time 6    Period Weeks    Status New    Target Date 11/05/19      PT LONG TERM GOAL #5   Title Pt will be able to stand for up to 20 minutes, in order to perform ADLs/IADLs.    Time 6    Period Weeks    Status New    Target Date 11/05/19                 Plan - 10/25/19 1410    Clinical Impression Statement Pt is making gradual progress toward improved posture and reduced pain with continued incr stamina and tolerance to exercise. She will benefit from 1x/week for 4 more weeks to maximize  functional potential for standing with less pain, improved R outer hip stab and strength, and balance.    Personal Factors and Comorbidities Age;Comorbidity 3+;Time since onset of injury/illness/exacerbation;Past/Current Experience;Fitness    Comorbidities HTN, DM, Glaucoma    Examination-Activity Limitations Lift;Squat;Bend;Caring for Others;Stand;Reach Overhead;Locomotion Level    Examination-Participation Restrictions Interpersonal Relationship;Cleaning;Laundry;Meal Prep;Community Activity    Stability/Clinical Decision Making Evolving/Moderate complexity    Rehab Potential Good    PT Frequency 2x / week    PT Duration 6 weeks    PT Treatment/Interventions ADLs/Self Care Home Management;Moist Heat;Electrical Stimulation;Cryotherapy;Joint Manipulations;Spinal Manipulations;Passive range of motion;Dry needling;Manual techniques;Patient/family education;Therapeutic exercise;Balance training;Neuromuscular re-education;Therapeutic activities;Functional mobility training;Gait training    PT Next Visit Plan Assess response to HEP, continue to assess R hip, hip and core strength, balance; progress to standing exercises    PT Home Exercise Plan [id number]: H/L Clams with RTB, SKTC, Figure 4 S, S/L Thoracic Rot'n           Patient will benefit from skilled therapeutic intervention in order to improve the following deficits and impairments:  Difficulty walking, Decreased balance, Decreased activity tolerance, Decreased strength, Increased fascial restricitons, Impaired flexibility, Postural dysfunction, Pain, Improper body mechanics, Impaired perceived functional ability, Decreased range of motion, Hypomobility  Visit Diagnosis: Chronic bilateral low back pain without sciatica  Abnormal posture  Pain in right hip  Muscle weakness (generalized)     Problem List Patient Active Problem List   Diagnosis Date Noted  . 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  . Essential hypertension  02/19/2019  . Hyperlipidemia 02/19/2019  . Palpitations 02/19/2019  . Shortness of breath 02/19/2019  . Hypocalcemia 12/03/2018  . Other hypoparathyroidism (HCC) 12/03/2018  . Pseudophakia of both eyes 04/23/2018  . Posterior capsular opacification of both  eyes, 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  . 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 Hypoparathyroidism (HCC) 12/03/2017  . Osteoarthritis of right hip 05/16/2016  . Diabetes mellitus without complication (HCC) 04/18/2016    Marcelline Mates, PT, DPT 10/25/2019, 2:52 PM  [facility] [address] [place], [place], [address] Phone: [phone]   Fax:  [phone]  Name: Norma Clay MRN: [id number] Date of Birth: 1950/11/05

## 2019-11-01 ENCOUNTER — Other Ambulatory Visit: Payer: Self-pay

## 2019-11-01 ENCOUNTER — Ambulatory Visit: Payer: Self-pay

## 2019-11-01 DIAGNOSIS — G8929 Other chronic pain: Secondary | ICD-10-CM

## 2019-11-01 DIAGNOSIS — M6281 Muscle weakness (generalized): Secondary | ICD-10-CM

## 2019-11-01 DIAGNOSIS — R293 Abnormal posture: Secondary | ICD-10-CM

## 2019-11-01 DIAGNOSIS — M25551 Pain in right hip: Secondary | ICD-10-CM

## 2019-11-01 NOTE — Therapy (Signed)
[facility] Outpatient Rehabilitation [facility] [address] [place], [place], [address] Phone: [phone]   Fax:  [phone]  Physical Therapy Treatment  Patient Details  Name: Norma Clay MRN: [id number] Date of Birth: 09/18/1950 Referring Provider (PT): Lavada Mesi, MD   Encounter Date: 11/01/2019   PT End of Session - 11/01/19 1039    Visit Number 5    Number of Visits 12    Date for PT Re-Evaluation 11/05/19    PT Start Time 1035   pt arrives late   PT Stop Time 1100    PT Time Calculation (min) 25 min    Activity Tolerance Patient tolerated treatment well    Behavior During Therapy [facility] for tasks assessed/performed           Past Medical History:  Diagnosis Date  . Diabetes mellitus without complication (HCC)   . Glaucoma   . Hypertension     Past Surgical History:  Procedure Laterality Date  . GLAUCOMA SURGERY    . THYROIDECTOMY      There were no vitals filed for this visit.   Subjective Assessment - 11/01/19 1039    Subjective Via interpeter, pt's right side is feeing better, but it's still in the left side.    Patient is accompained by: Interpreter   Ziad [phone]   Pertinent History Falls, Hysterectomy    Limitations Standing;Walking;Lifting;House hold activities    Patient Stated Goals Reduce pain    Currently in Pain? Yes   left-sided LBP, unknown #   Pain Onset More than a month ago                             [facility] Adult PT Treatment/Exercise - 11/01/19 0001      Ambulation/Gait   Gait Comments Edu to use quad cane on L side - lowered cane a few notches and practiced upright posture with forward/upward gaze x 50 feet c pt demo sig improvement in gait and R hip stability (decr L hip drop/trendelenburg)      Lumbar Exercises: Aerobic   Nustep L6 UE/LE 5'      Lumbar Exercises: Sidelying   Other Sidelying Lumbar Exercises L S/L sustained thoracic rotation c HBH 3 x 30"      Manual Therapy   Manual  Therapy Soft tissue mobilization    Manual therapy comments B S/L     Soft tissue mobilization STM/DTM to B L/S PS, QL, superior glutes                    PT Short Term Goals - 09/24/19 1703      PT SHORT TERM GOAL #1   Title Pt will be I and compliant with initial HEP.    Time 3    Period Weeks    Status New    Target Date 10/15/19      PT SHORT TERM GOAL #2   Title Pt will report ability to stand for 10 minutes to help around the house.    Time 3    Period Weeks    Status New    Target Date 10/15/19             PT Long Term Goals - 09/24/19 1704      PT LONG TERM GOAL #1   Title Pt will be I and compliant with long term HEP for progression and maintenance of condition.    Time 6  Period Weeks    Status New    Target Date 11/05/19      PT LONG TERM GOAL #2   Title Pt will demo at least 4/5 hip MMT for improved stability of pelvis and lumbar spine.    Time 6    Period Weeks    Status New    Target Date 11/05/19      PT LONG TERM GOAL #3   Title Pt will score 45/56 on Berg Balance test, in order to demonstrate decreased fall risk.    Time 6    Period Weeks    Status New    Target Date 11/05/19      PT LONG TERM GOAL #4   Title Pt will report decrease in pain by 50%, in order to participate in leisure activities more frequently.    Time 6    Period Weeks    Status New    Target Date 11/05/19      PT LONG TERM GOAL #5   Title Pt will be able to stand for up to 20 minutes, in order to perform ADLs/IADLs.    Time 6    Period Weeks    Status New    Target Date 11/05/19                 Plan - 11/01/19 1041    Clinical Impression Statement Pt presents late, requiring shorter session. Focused on warm-up and manual therapy with pt demo increased soft tissue tension to  L L/S PS and QL, as well as R upper glute. Edu for using quad cane on L side and lowering quad cane with significant improvement in gait and posture demonstrated as she  ambulated out of session.    Personal Factors and Comorbidities Age;Comorbidity 3+;Time since onset of injury/illness/exacerbation;Past/Current Experience;Fitness    Comorbidities HTN, DM, Glaucoma    Examination-Activity Limitations Lift;Squat;Bend;Caring for Others;Stand;Reach Overhead;Locomotion Level    Examination-Participation Restrictions Interpersonal Relationship;Cleaning;Laundry;Meal Prep;Community Activity    Stability/Clinical Decision Making Evolving/Moderate complexity    Rehab Potential Good    PT Frequency 2x / week    PT Duration 6 weeks    PT Treatment/Interventions ADLs/Self Care Home Management;Moist Heat;Electrical Stimulation;Cryotherapy;Joint Manipulations;Spinal Manipulations;Passive range of motion;Dry needling;Manual techniques;Patient/family education;Therapeutic exercise;Balance training;Neuromuscular re-education;Therapeutic activities;Functional mobility training;Gait training    PT Next Visit Plan Assess response to HEP and update PRN, hip and core strength, balance; general standing strengthening exercises, manual as indicated for pain and soft tissue/joint restrictions    PT Home Exercise Plan [id number]: H/L Clams with RTB, SKTC, Figure 4 S, S/L Thoracic Rot'n    Consulted and Agree with Plan of Care Patient           Patient will benefit from skilled therapeutic intervention in order to improve the following deficits and impairments:  Difficulty walking, Decreased balance, Decreased activity tolerance, Decreased strength, Increased fascial restricitons, Impaired flexibility, Postural dysfunction, Pain, Improper body mechanics, Impaired perceived functional ability, Decreased range of motion, Hypomobility  Visit Diagnosis: Chronic bilateral low back pain without sciatica  Abnormal posture  Pain in right hip  Muscle weakness (generalized)     Problem List Patient Active Problem List   Diagnosis Date Noted  . Post-surgical hypoparathyroidism (HCC)  03/25/2019  . Essential hypertension 02/19/2019  . Hyperlipidemia 02/19/2019  . Palpitations 02/19/2019  . Shortness of breath 02/19/2019  . Hypocalcemia 12/03/2018  . Other hypoparathyroidism (HCC) 12/03/2018  . Pseudophakia of both eyes 04/23/2018  . Posterior capsular opacification of both eyes, obscuring  vision 04/23/2018  . 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 Hypoparathyroidism (HCC) 12/03/2017  . Osteoarthritis of right hip 05/16/2016  . Diabetes mellitus without complication (HCC) 04/18/2016    Marcelline Mates, PT, DPT 11/01/2019, 11:06 AM  [facility] [address] [place], [place], [address] Phone: [phone]   Fax:  [phone]  Name: Mande Auvil MRN: [id number] Date of Birth: January 23, 1950

## 2019-11-05 ENCOUNTER — Ambulatory Visit: Payer: Self-pay | Admitting: Neurology

## 2019-11-11 ENCOUNTER — Other Ambulatory Visit: Payer: Self-pay | Admitting: Family Medicine

## 2019-11-11 DIAGNOSIS — E785 Hyperlipidemia, unspecified: Secondary | ICD-10-CM

## 2019-11-11 DIAGNOSIS — E1169 Type 2 diabetes mellitus with other specified complication: Secondary | ICD-10-CM

## 2019-11-11 MED FILL — PREDNISOLONE AC 1% EYE DROP: 1 | 37 days supply | Qty: 5 | Fill #5

## 2019-11-11 MED FILL — CALCITRIOL 0.25 MCG CAPS: 0.25 | 30 days supply | Qty: 60 | Fill #9

## 2019-11-11 MED FILL — TRAVATAN Z 0.004 % SOLN: 0.004 | 18 days supply | Qty: 3 | Fill #4

## 2019-11-11 MED FILL — TRUE METRIX TEST STRIP: 30 days supply | Qty: 100 | Fill #4

## 2019-11-11 MED FILL — AMLODIPINE BESYLATE 5 MG TA: 5 | 30 days supply | Qty: 30 | Fill #0

## 2019-11-11 MED FILL — LOSARTAN POTASSIUM 25 MG TA: 25 | 30 days supply | Qty: 30 | Fill #6

## 2019-11-11 MED FILL — GLIMEPIRIDE 4 MG TABS: 4 | 30 days supply | Qty: 30 | Fill #5

## 2019-11-11 MED FILL — ?METFORMIN HCL 850 MG TABLE: 850 | 30 days supply | Qty: 60 | Fill #2

## 2019-11-11 MED FILL — ?ATORVASTATIN 20 MG TABLET: 20 | 30 days supply | Qty: 30 | Fill #0

## 2019-11-11 MED FILL — LEVOTHYROXINE SODIUM 100 MC: 100 | 30 days supply | Qty: 30 | Fill #1

## 2019-11-11 MED FILL — DORZOLAMIDE-TIMOLOL EYE DRP: 22.3-6.8 | 37 days supply | Qty: 10 | Fill #3

## 2019-11-11 NOTE — Telephone Encounter (Signed)
Requested Prescriptions  Pending Prescriptions Disp Refills   atorvastatin (LIPITOR) 20 MG tablet [Pharmacy Med Name: ATORVASTATIN CALCIUM 20 MG 20 Tablet] 30 tablet 2    Sig: TAKE 1 TABLET (20 MG TOTAL) BY MOUTH DAILY.     Cardiovascular:  Antilipid - Statins Failed - 11/11/2019  8:57 AM      Failed - LDL in normal range and within 360 days    LDL Chol Calc (NIH)  Date Value Ref Range Status  04/17/2019 59 0 - 99 mg/dL Final         Passed - Total Cholesterol in normal range and within 360 days    Cholesterol, Total  Date Value Ref Range Status  04/17/2019 138 100 - 199 mg/dL Final         Passed - HDL in normal range and within 360 days    HDL  Date Value Ref Range Status  04/17/2019 68 >39 mg/dL Final         Passed - Triglycerides in normal range and within 360 days    Triglycerides  Date Value Ref Range Status  04/17/2019 50 0 - 149 mg/dL Final         Passed - Patient is not pregnant      Passed - Valid encounter within last 12 months    Recent Outpatient Visits          6 months ago Allergic rhinitis, unspecified seasonality, unspecified trigger   [facility] [place], [place], [place]   8 months ago Diabetes mellitus without complication (HCC)   [facility] [place], [place], MD   9 months ago Diabetes mellitus without complication (HCC)   [facility], [place], MD   1 year ago Type 2 diabetes mellitus without complication, without long-term current use of insulin (HCC)   [facility], [place], MD   1 year ago Diabetes mellitus without complication [facility])    [facility] [place], [place], MD

## 2019-11-15 ENCOUNTER — Ambulatory Visit: Payer: Self-pay | Admitting: Neurology

## 2019-11-15 ENCOUNTER — Ambulatory Visit: Payer: Medicare Other | Attending: Family Medicine

## 2019-11-15 ENCOUNTER — Other Ambulatory Visit: Payer: Self-pay

## 2019-11-15 DIAGNOSIS — M25551 Pain in right hip: Secondary | ICD-10-CM | POA: Insufficient documentation

## 2019-11-15 DIAGNOSIS — M6281 Muscle weakness (generalized): Secondary | ICD-10-CM | POA: Insufficient documentation

## 2019-11-15 DIAGNOSIS — G8929 Other chronic pain: Secondary | ICD-10-CM | POA: Insufficient documentation

## 2019-11-15 DIAGNOSIS — M545 Low back pain, unspecified: Secondary | ICD-10-CM | POA: Diagnosis not present

## 2019-11-15 DIAGNOSIS — R293 Abnormal posture: Secondary | ICD-10-CM | POA: Diagnosis not present

## 2019-11-15 NOTE — Therapy (Signed)
[facility] Outpatient Rehabilitation [facility] [address] [place], [place], [address] Phone: [phone]   Fax:  [phone]  Physical Therapy Treatment/Progress Progress Note Reporting Period 09/24/19 to 11/15/19  See note below for Objective Data and Assessment of Progress/Goals.       Patient Details  Name: Norma Clay MRN: [id number] Date of Birth: 1950-03-22 Referring Provider (PT): Lavada Mesi, MD   Encounter Date: 11/15/2019   PT End of Session - 11/15/19 1337    Visit Number 6    Number of Visits 12    Date for PT Re-Evaluation 01/03/20    PT Start Time 1325    PT Stop Time 1400    PT Time Calculation (min) 35 min    Activity Tolerance Patient tolerated treatment well    Behavior During Therapy [facility] for tasks assessed/performed           Past Medical History:  Diagnosis Date  . Diabetes mellitus without complication (HCC)   . Glaucoma   . Hypertension     Past Surgical History:  Procedure Laterality Date  . GLAUCOMA SURGERY    . THYROIDECTOMY      There were no vitals filed for this visit.   Subjective Assessment - 11/15/19 1332    Subjective Via interpeter, pt feels ok but pt was having low back pain last night. She was told she has borderline osteoporosis at her last imaging.    Patient is accompained by: Interpreter   Ziad [phone]   Pertinent History Falls, Hysterectomy    Limitations Standing;Walking;Lifting;House hold activities    Patient Stated Goals Reduce pain    Currently in Pain? Yes    Pain Score 10-Worst pain ever    Pain Location Back   right hip   Pain Orientation Right;Lower    Pain Descriptors / Indicators Aching    Pain Onset More than a month ago                             [facility] Adult PT Treatment/Exercise - 11/15/19 0001      Standardized Balance Assessment   Standardized Balance Assessment Berg Balance Test      Berg Balance Test   Sit to Stand Able to stand  independently using  hands    Standing Unsupported Able to stand safely 2 minutes    Sitting with Back Unsupported but Feet Supported on Floor or Stool Able to sit safely and securely 2 minutes    Stand to Sit Controls descent by using hands    Transfers Able to transfer safely, definite need of hands    Standing Unsupported with Eyes Closed Able to stand 10 seconds safely    Standing Ubsupported with Feet Together Able to place feet together independently and stand for 1 minute with supervision    From Standing, Reach Forward with Outstretched Arm Can reach forward >5 cm safely (2")    From Standing Position, Pick up Object from Floor Able to pick up shoe safely and easily    From Standing Position, Turn to Look Behind Over each Shoulder Turn sideways only but maintains balance    Turn 360 Degrees Able to turn 360 degrees safely but slowly    Standing Unsupported, Alternately Place Feet on Step/Stool Able to complete >2 steps/needs minimal assist    Standing Unsupported, One Foot in Front Able to take small step independently and hold 30 seconds    Standing on One Leg Tries  to lift leg/unable to hold 3 seconds but remains standing independently    Total Score 38                    PT Short Term Goals - 11/15/19 1338      PT SHORT TERM GOAL #1   Title Pt will be I and compliant with initial HEP.    Status Achieved      PT SHORT TERM GOAL #2   Title Pt will report ability to stand for 10 minutes to help around the house.    Status Achieved             PT Long Term Goals - 11/15/19 1339      PT LONG TERM GOAL #1   Title Pt will be I and compliant with long term HEP for progression and maintenance of condition.    Status On-going    Target Date 01/03/20      PT LONG TERM GOAL #2   Title Pt will demo at least 4/5 hip MMT for improved stability of pelvis and lumbar spine.    Status Achieved    Target Date 01/03/20      PT LONG TERM GOAL #3   Title Pt will score 45/56 on Berg Balance  test, in order to demonstrate decreased fall risk.    Baseline 38/56    Status On-going    Target Date 01/03/20      PT LONG TERM GOAL #4   Title Pt will report decrease in pain by 50%, in order to participate in leisure activities more frequently.    Baseline pain level up to a 10/10    Status On-going    Target Date 01/03/20      PT LONG TERM GOAL #5   Baseline 10 min    Status On-going    Target Date 01/03/20                 Plan - 11/15/19 1515    Clinical Impression Statement Pt presents late today and session was focused on reviewing goals and balance. Pt would benefit from further skilled PT to progress balance and standing tolerance.    Personal Factors and Comorbidities Age;Comorbidity 3+;Time since onset of injury/illness/exacerbation;Past/Current Experience;Fitness    Comorbidities HTN, DM, Glaucoma    Examination-Activity Limitations Lift;Squat;Bend;Caring for Others;Stand;Reach Overhead;Locomotion Level    Examination-Participation Restrictions Interpersonal Relationship;Cleaning;Laundry;Meal Prep;Community Activity    Stability/Clinical Decision Making Evolving/Moderate complexity    Rehab Potential Good    PT Frequency 2x / week    PT Duration 6 weeks    PT Treatment/Interventions ADLs/Self Care Home Management;Moist Heat;Electrical Stimulation;Cryotherapy;Joint Manipulations;Spinal Manipulations;Passive range of motion;Dry needling;Manual techniques;Patient/family education;Therapeutic exercise;Balance training;Neuromuscular re-education;Therapeutic activities;Functional mobility training;Gait training    PT Next Visit Plan Assess response to HEP and update PRN, hip and core strength, balance; general standing strengthening exercises, manual as indicated for pain and soft tissue/joint restrictions    PT Home Exercise Plan [id number]: H/L Clams with RTB, SKTC, Figure 4 S, S/L Thoracic Rot'n    Consulted and Agree with Plan of Care Patient           Patient  will benefit from skilled therapeutic intervention in order to improve the following deficits and impairments:  Difficulty walking, Decreased balance, Decreased activity tolerance, Decreased strength, Increased fascial restricitons, Impaired flexibility, Postural dysfunction, Pain, Improper body mechanics, Impaired perceived functional ability, Decreased range of motion, Hypomobility  Visit Diagnosis: Chronic bilateral low back pain without sciatica -  Plan: PT plan of care cert/re-cert  Abnormal posture - Plan: PT plan of care cert/re-cert  Pain in right hip - Plan: PT plan of care cert/re-cert  Muscle weakness (generalized) - Plan: PT plan of care cert/re-cert     Problem List Patient Active Problem List   Diagnosis Date Noted  . 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  . Essential hypertension 02/19/2019  . Hyperlipidemia 02/19/2019  . Palpitations 02/19/2019  . Shortness of breath 02/19/2019  . Hypocalcemia 12/03/2018  . Other hypoparathyroidism (HCC) 12/03/2018  . Pseudophakia of both eyes 04/23/2018  . Posterior capsular opacification of both eyes, 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  . 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 Hypoparathyroidism (HCC) 12/03/2017  . Osteoarthritis of right hip 05/16/2016  . Diabetes mellitus without complication (HCC) 04/18/2016    Marcelline Mates, PT, DPT 11/15/2019, 3:18 PM  [facility] [address] [place], [place], [address] Phone: [phone]   Fax:  [phone]  Name: Norma Clay MRN: [id number] Date of Birth: May 04, 1950

## 2019-11-22 ENCOUNTER — Other Ambulatory Visit: Payer: Self-pay

## 2019-11-22 ENCOUNTER — Encounter: Payer: Self-pay | Admitting: Internal Medicine

## 2019-11-22 ENCOUNTER — Ambulatory Visit (INDEPENDENT_AMBULATORY_CARE_PROVIDER_SITE_OTHER): Payer: Medicaid Other | Admitting: Internal Medicine

## 2019-11-22 VITALS — BP 148/76 | HR 80 | Ht 63.0 in | Wt 172.5 lb

## 2019-11-22 DIAGNOSIS — E890 Postprocedural hypothyroidism: Secondary | ICD-10-CM | POA: Diagnosis not present

## 2019-11-22 DIAGNOSIS — R300 Dysuria: Secondary | ICD-10-CM

## 2019-11-22 DIAGNOSIS — E892 Postprocedural hypoparathyroidism: Secondary | ICD-10-CM | POA: Diagnosis not present

## 2019-11-22 LAB — URINALYSIS, ROUTINE W REFLEX MICROSCOPIC
Bilirubin Urine: NEGATIVE
Ketones, ur: NEGATIVE
Leukocytes,Ua: NEGATIVE
Nitrite: NEGATIVE
Specific Gravity, Urine: 1.02 (ref 1.000–1.030)
Total Protein, Urine: NEGATIVE
Urine Glucose: >=1000 — AB
Urobilinogen, UA: 0.2 (ref 0.0–1.0)
WBC, UA: NONE SEEN (ref 0–?)
pH: 7 (ref 5.0–8.0)

## 2019-11-22 LAB — BASIC METABOLIC PANEL
BUN: 31 mg/dL — ABNORMAL HIGH (ref 6–23)
CO2: 27 mEq/L (ref 19–32)
Calcium: 8.9 mg/dL (ref 8.4–10.5)
Chloride: 101 mEq/L (ref 96–112)
Creatinine, Ser: 0.95 mg/dL (ref 0.40–1.20)
GFR: 60.54 mL/min (ref >60.00)
Glucose, Bld: 179 mg/dL — ABNORMAL HIGH (ref 70–99)
Potassium: 4 mEq/L (ref 3.5–5.1)
Sodium: 136 mEq/L (ref 135–145)

## 2019-11-22 LAB — TSH: TSH: 0.36 u[IU]/mL (ref 0.35–4.50)

## 2019-11-22 LAB — VITAMIN D 25 HYDROXY (VIT D DEFICIENCY, FRACTURES): VITD: 44 ng/mL (ref 30.00–100.00)

## 2019-11-22 LAB — ALBUMIN: Albumin: 4 g/dL (ref 3.5–5.2)

## 2019-11-22 NOTE — Patient Instructions (Signed)
-   Continue Calcium Carbonate  1 tablet three times daily for now  - Continue Calcitriol two capsules a day  - Continue Over the counter Vitamin D3 400 iu daily    Try cranberry tablets per bottle instructions to help with urine infections

## 2019-11-22 NOTE — Progress Notes (Signed)
Name: Norma Clay  MRN/ DOB: [id number], 1950/11/20    Age/ Sex: 69 y.o., female     PCP: Norma Blackbird, MD   Reason for Endocrinology Evaluation: Post-surgical hypoparathyroidism     Initial Endocrinology Clinic Visit: 12/08/17    PATIENT IDENTIFIER: Norma Clay is a 69 y.o., female with a past medical history of Total thyroidectomy and T2DM. She has followed with [facility] since 12/08/2017 for consultative assistance with management of her Post-surgical hypoparathyroidism.   HISTORICAL SUMMARY:  She  moved from [place] in 2019. 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 She denies history of thyroid cancer. Pt noted hand spasms following 2nd surgery and has been on Calcium and Calcitriol since. Historically has compliance issues which results in serum calcium fluctuations.  24-hr urine collection normal 06/2018  SUBJECTIVE:   Today (11/22/2019):  Norma Clay is here with her son Norma Clay. She is here for a follow up on hypocalcemia.    Today she is c/o aches and pains as well as frequency, and dysuria  She  has noted localized anterior neck tenderness   Denies  numbness or perioral tingling. No constipation      Medication: Calcium carbonate (oyster shell) 500 mg, 1 tabs TID  Calcitriol 0.25 MCG, 2 caps daily Vitamin D3 400 IU daily     HISTORY:  Past Medical History:  Past Medical History:  Diagnosis Date  . Diabetes mellitus without complication ([place])   . Glaucoma   . Hypertension    Past Surgical History:  Past Surgical History:  Procedure Laterality Date  . GLAUCOMA SURGERY    . THYROIDECTOMY      Social History:  reports that she has never smoked. She has never used smokeless tobacco. She reports that she does not drink alcohol and does not use drugs.  Family History: family history includes Diabetes in her mother.   HOME MEDICATIONS: Allergies as of  11/22/2019      Reactions   Clindamycin/lincomycin Rash   Penicillins Rash   Patient was placed on a form of penicillin by her dentist and developed a rash therefore medication was changed to clindamycin.  Per son, patient is not allergic to clindamycin      Medication List       Accurate as of November 22, 2019 10:16 AM. If you have any questions, ask your nurse or doctor.        STOP taking these medications   cephALEXin 500 MG capsule Commonly known as: KEFLEX Stopped by: Norma Sciara, MD     TAKE these medications   acetaminophen 325 MG tablet Commonly known as: Tylenol Take 1-2 tablets (325-650 mg total) by mouth every 6 (six) hours as needed for moderate pain.   amLODipine 5 MG tablet Commonly known as: NORVASC Take 1 tablet (5 mg total) by mouth daily. To lower blood pressure   atorvastatin 20 MG tablet Commonly known as: LIPITOR TAKE 1 TABLET (20 MG TOTAL) BY MOUTH DAILY.   Bismuth Subsalicylate 622 MG Tabs Take 1 tablet (262 mg total) by mouth in the morning, at noon, in the evening, and at bedtime.   calcitRIOL 0.25 MCG capsule Commonly known as: ROCALTROL Take 2 capsules (0.5 mcg total) by mouth daily.   calcium carbonate 1500 (600 Ca) MG Tabs tablet Commonly known as: OSCAL Take 1 tablet (1,500 mg total) by mouth 3 (three) times daily with meals.  cetirizine 10 MG tablet Commonly known as: ZyrTEC Allergy Take 1 tablet (10 mg total) by mouth daily.   diclofenac Sodium 1 % Gel Commonly known as: VOLTAREN RUB 2 GRAMS INTO AFFECTED AREA OF FOOT 2 TO 4 TIMES DAILY   dorzolamide-timolol 22.3-6.8 MG/ML ophthalmic solution Commonly known as: COSOPT Place 1 drop into both eyes 2 (two) times daily.   glimepiride 4 MG tablet Commonly known as: AMARYL One pill once per day before a meal   levothyroxine 100 MCG tablet Commonly known as: SYNTHROID TAKE 1 TABLET BY MOUTH ONCE A DAY. DO NOT EAT FOR 30 MINUTES AFTER TAKING   loratadine 10 MG  tablet Commonly known as: CLARITIN Take 1 tablet (10 mg total) by mouth daily. As needed for nasal congestion/drainage   losartan 25 MG tablet Commonly known as: COZAAR Take 1 tablet (25 mg total) by mouth daily.   metFORMIN 850 MG tablet Commonly known as: GLUCOPHAGE Take 1 tablet (850 mg total) by mouth 2 (two) times daily with a meal.   metroNIDAZOLE 500 MG tablet Commonly known as: FLAGYL Take 1 tablet (500 mg total) by mouth 4 (four) times daily.   Netarsudil Dimesylate 0.02 % Soln Apply 1 drop to eye every evening.   nystatin cream Commonly known as: MYCOSTATIN Apply 1 application topically 2 (two) times daily. X 10 days then as needed   omeprazole 40 MG capsule Commonly known as: PRILOSEC Take 1 capsule (40 mg total) by mouth 2 (two) times daily.   pantoprazole 40 MG tablet Commonly known as: PROTONIX Take 1 tablet (40 mg total) by mouth 2 (two) times daily.   tetracycline 500 MG capsule Commonly known as: SUMYCIN Take 1 capsule (500 mg total) by mouth 4 (four) times daily.   traMADol 50 MG tablet Commonly known as: ULTRAM Take 0.5-1 tablets (25-50 mg total) by mouth 3 (three) times daily as needed.   Travatan Z 0.004 % Soln ophthalmic solution Generic drug: Travoprost (BAK Free) Apply 1 drop to eye at bedtime.   True Metrix Blood Glucose Test test strip Generic drug: glucose blood Use as instructed to check blood sugars once per day   True Metrix Meter w/Device Kit 1 each by Does not apply route 2 (two) times daily.   TRUEplus Lancets 28G Misc Use to check blood sugar up to 3 times daily.   Vitamin D3 10 MCG (400 UNIT) tablet Take 1 tablet (400 Units total) by mouth daily.         OBJECTIVE:   PHYSICAL EXAM: VS: BP (!) 148/76   Pulse 80   Ht '5\' 3"'  (1.6 m)   Wt 172 lb 8 oz (78.2 kg)   SpO2 98%   BMI 30.56 kg/m    EXAM: General: Pt appears well and is in NAD Soft localized swelling at the anterior neck with tenderness   Lungs: Clear  with good BS bilat with no rales, rhonchi, or wheezes  Heart: Auscultation: RRR.  Extremities: BL LE: Trace pretibial edema   Mental Status: Judgment, insight: Intact Mood and affect: No depression, anxiety, or agitation     DATA REVIEWED:  Results for Norma, Clay (MRN [id number]) as of 11/22/2019 13:29  Ref. Range 11/22/2019 10:22  Sodium Latest Ref Range: 135 - 145 mEq/L 136  Potassium Latest Ref Range: 3.5 - 5.1 mEq/L 4.0  Chloride Latest Ref Range: 96 - 112 mEq/L 101  CO2 Latest Ref Range: 19 - 32 mEq/L 27  Glucose Latest Ref Range: 70 - 99 mg/dL 179 (  H)  BUN Latest Ref Range: 6 - 23 mg/dL 31 (H)  Creatinine Latest Ref Range: 0.40 - 1.20 mg/dL 0.95  Calcium Latest Ref Range: 8.4 - 10.5 mg/dL 8.9  Albumin Latest Ref Range: 3.5 - 5.2 g/dL 4.0  GFR Latest Ref Range: >60.00 mL/min 60.54  VITD Latest Ref Range: 30.00 - 100.00 ng/mL 44.00  TSH Latest Ref Range: 0.35 - 4.50 uIU/mL 0.36  URINALYSIS, ROUTINE W REFLEX MICROSCOPIC Unknown Rpt (A)  Appearance Latest Ref Range: Clear;Turbid;Slightly Cloudy;Cloudy  CLEAR  Bilirubin Urine Latest Ref Range: Negative  NEGATIVE  Color, Urine Latest Ref Range: Yellow;Lt. Yellow;Straw;Dark Yellow;Amber;Green;Red;Brown  YELLOW  Hgb urine dipstick Latest Ref Range: Negative  MODERATE (A)  Ketones, ur Latest Ref Range: Negative  NEGATIVE  Leukocytes,Ua Latest Ref Range: Negative  NEGATIVE  Nitrite Latest Ref Range: Negative  NEGATIVE  pH Latest Ref Range: 5.0 - 8.0  7.0  Specific Gravity, Urine Latest Ref Range: 1.000 - 1.030  1.020  Urine Glucose Latest Ref Range: Negative  >=1000 (A)  Urobilinogen, UA Latest Ref Range: 0.0 - 1.0  0.2  RBC / HPF Latest Ref Range: 0-2/hpf  0-2/hpf  Squamous Epithelial / LPF Latest Ref Range: Rare(0-4/hpf)  Rare(0-4/hpf)  WBC, UA Latest Ref Range: 0-2/hpf  none seen  Total Protein, Urine-UPE24 Latest Ref Range: Negative  NEGATIVE      ASSESSMENT / PLAN / RECOMMENDATIONS:   1. Surgical Hypoparathyroidism :     - Pt is asymptomatic  - Calcium level is acceptable at 8.9  mg/dl which is at goal  - Historically she has had imperfect adherence to vitamin intake , hence the variability of serum calcium  - NO changes today     Medications:  Continue Calcium Carbonate 500 mg 1 tabs TID Continue Calcitriol 0.5 mcg daily  (0.25 mcg x2) Continue Vitamin D3 400 iu daily     2. Postsurgical hypothyroidism :  - Clinically euthyroid  - Pt with local neck symptoms, will proceed with thyroid bed ultrasound  - TSH is normal    3. Dysuria:   - UA is clean, no evidence of UTI but pt with glucosuria  - Her DM has been managed by PCP for which son was advised to follow up with PCP for diabetes management. If needed I will be happy to handle this.    F/u in 6 months    Signed electronically by: Mack Guise, MD  [facility]  [facility] [address] [place], [place] [address] Phone: [phone] FAX: [phone]      CC: Norma Blackbird, MD [place] [address] Phone: [phone]  Fax: [phone]   Return to Endocrinology clinic as below: Future Appointments  Date Time Provider [place]  12/03/2019  3:45 PM Su Hoff [facility]  12/17/2019  8:00 AM Star Age, MD [facility] None

## 2019-12-02 ENCOUNTER — Other Ambulatory Visit: Payer: Self-pay | Admitting: Family Medicine

## 2019-12-02 DIAGNOSIS — E119 Type 2 diabetes mellitus without complications: Secondary | ICD-10-CM

## 2019-12-02 NOTE — Telephone Encounter (Signed)
Requested medication (s) are due for refill today: yes  Requested medication (s) are on the active medication list: yes  Last refill:  03/08/19 100 each with 4 refills  Future visit scheduled: no  Notes to clinic:  Please review for refill. Former patient of Dr. Jillyn Hidden    Requested Prescriptions  Pending Prescriptions Disp Refills   TRUE METRIX BLOOD GLUCOSE TEST test strip [Pharmacy Med Name: TRUE METRIX TEST STRIP] 100 each 4    Sig: Use as instructed to check blood sugars once per day      Endocrinology: Diabetes - Testing Supplies Passed - 12/02/2019  4:54 PM      Passed - Valid encounter within last 12 months    Recent Outpatient Visits           6 months ago Allergic rhinitis, unspecified seasonality, unspecified trigger   [facility] [place], [place], MD   9 months ago Diabetes mellitus without complication (HCC)   [facility] F[place], [place], MD   9 months ago Diabetes mellitus without complication (HCC)   [facility] F[place], [place], MD   1 year ago Type 2 diabetes mellitus without complication, without long-term current use of insulin (HCC)   [facility] Fulp, [place], MD   1 year ago Diabetes mellitus without complication [facility])   [facility] [place], [place], MD

## 2019-12-03 ENCOUNTER — Other Ambulatory Visit: Payer: Self-pay

## 2019-12-03 ENCOUNTER — Ambulatory Visit: Payer: Medicare Other

## 2019-12-03 DIAGNOSIS — G8929 Other chronic pain: Secondary | ICD-10-CM | POA: Diagnosis not present

## 2019-12-03 DIAGNOSIS — M25551 Pain in right hip: Secondary | ICD-10-CM | POA: Diagnosis not present

## 2019-12-03 DIAGNOSIS — M545 Low back pain, unspecified: Secondary | ICD-10-CM | POA: Diagnosis not present

## 2019-12-03 DIAGNOSIS — R293 Abnormal posture: Secondary | ICD-10-CM

## 2019-12-03 DIAGNOSIS — M6281 Muscle weakness (generalized): Secondary | ICD-10-CM | POA: Diagnosis not present

## 2019-12-03 MED FILL — TRAVATAN Z 0.004 % SOLN: 0.004 | 53 days supply | Qty: 8 | Fill #0

## 2019-12-03 NOTE — Therapy (Signed)
[facility] [address] [place], [place], [address] Phone: [phone]   Fax:  [phone]  Physical Therapy Treatment  Patient Details  Name: Norma Clay MRN: [id number] Date of Birth: 04-Sep-1950 Referring Provider (PT): Lavada Mesi, MD   Encounter Date: 12/03/2019   PT End of Session - 12/03/19 1615    Visit Number 7    Number of Visits 18    Date for PT Re-Evaluation 01/25/19    Authorization Type [employer]    PT Start Time 1545    PT Stop Time 1630    PT Time Calculation (min) 45 min    Activity Tolerance Patient tolerated treatment well    Behavior During Therapy [facility] for tasks assessed/performed           Past Medical History:  Diagnosis Date  . Diabetes mellitus without complication (HCC)   . Glaucoma   . Hypertension     Past Surgical History:  Procedure Laterality Date  . GLAUCOMA SURGERY    . THYROIDECTOMY      There were no vitals filed for this visit.   Subjective Assessment - 12/03/19 1550    Subjective Pt reports she went to the doctor to f/u on bladder issues, but everything was fine.    Patient is accompained by: Interpreter    Pertinent History Falls, Hysterectomy    Limitations Standing;Walking;Lifting;House hold activities    Patient Stated Goals Reduce pain    Currently in Pain? Yes    Pain Score 5     Pain Location Back   and hip   Pain Orientation Right;Lower    Pain Descriptors / Indicators Aching    Pain Onset More than a month ago                             [facility] Adult PT Treatment/Exercise - 12/03/19 0001      Self-Care   Self-Care Posture;Other Self-Care Comments    Posture upright posture, quad cane in L hand vs. R    Other Self-Care Comments  supine lying with R hip/knee extension for passive stretch      Exercises   Exercises Knee/Hip      Lumbar Exercises: Aerobic   Nustep L6 UE/LE 5'      Lumbar Exercises: Standing   Heel Raises --     Heel Raises Limitations --      Lumbar Exercises: Supine   Bridge with [employer] 15 reps    Bridge with [employer] Limitations small ROM      Knee/Hip Exercises: Stretches   Hip Flexor Stretch Limitations in supine, propper on ball then PT's fist then flat with manual soft tissue release to quads/HF      Knee/Hip Exercises: Standing   Heel Raises 2 sets;10 reps    Heel Raises Limitations at FM bar    Hip Flexion --    Hip Abduction Stengthening;2 sets;10 reps;Knee straight    Abduction Limitations 2# ankle weights    Functional Squat 1 set;15 reps    Functional Squat Limitations at FM bar, max TCs and VCs (via interpreter)   for upright posture, incr squat depth, stance     Knee/Hip Exercises: Sidelying   Other Sidelying Knee/Hip Exercises Leg kicks, maintaining hip abd at hip height x 12 B      Manual Therapy   Manual Therapy Soft tissue mobilization    Manual therapy comments supine  Soft tissue mobilization STM R hip flexors, quads                    PT Short Term Goals - 11/15/19 1338      PT SHORT TERM GOAL #1   Title Pt will be I and compliant with initial HEP.    Status Achieved      PT SHORT TERM GOAL #2   Title Pt will report ability to stand for 10 minutes to help around the house.    Status Achieved             PT Long Term Goals - 11/15/19 1339      PT LONG TERM GOAL #1   Title Pt will be I and compliant with long term HEP for progression and maintenance of condition.    Status On-going    Target Date 01/03/20      PT LONG TERM GOAL #2   Title Pt will demo at least 4/5 hip MMT for improved stability of pelvis and lumbar spine.    Status Achieved    Target Date 01/03/20      PT LONG TERM GOAL #3   Title Pt will score 45/56 on Berg Balance test, in order to demonstrate decreased fall risk.    Baseline 38/56    Status On-going    Target Date 01/03/20      PT LONG TERM GOAL #4   Title Pt will report decrease in pain by 50%, in  order to participate in leisure activities more frequently.    Baseline pain level up to a 10/10    Status On-going    Target Date 01/03/20      PT LONG TERM GOAL #5   Baseline 10 min    Status On-going    Target Date 01/03/20                 Plan - 12/03/19 1616    Clinical Impression Statement Pt tolerated tx well with ability to tolerate RLE into full knee/hip extension to neutral in supine, as well as bridging with ball squeeze today. Difficulty with sidelying hip abduction hold + kicks into extension R > L that slightly improved with repetitions. Pt progressed in more standing, adding 2# cw's and squats at [facility] today.    Personal Factors and Comorbidities Age;Comorbidity 3+;Time since onset of injury/illness/exacerbation;Past/Current Experience;Fitness    Comorbidities HTN, DM, Glaucoma    Examination-Activity Limitations Lift;Squat;Bend;Caring for Others;Stand;Reach Overhead;Locomotion Level    Examination-Participation Restrictions Interpersonal Relationship;Cleaning;Laundry;Meal Prep;Community Activity    Stability/Clinical Decision Making Evolving/Moderate complexity    Rehab Potential Good    PT Frequency 2x / week    PT Duration 6 weeks    PT Treatment/Interventions ADLs/Self Care Home Management;Moist Heat;Electrical Stimulation;Cryotherapy;Joint Manipulations;Spinal Manipulations;Passive range of motion;Dry needling;Manual techniques;Patient/family education;Therapeutic exercise;Balance training;Neuromuscular re-education;Therapeutic activities;Functional mobility training;Gait training    PT Next Visit Plan Assess response to HEP and update PRN, hip and core strength, balance; general standing strengthening exercises, manual as indicated for pain and soft tissue/joint restrictions; progress R quad/hip flexor extensibility to tolerate R hip extension    PT Home Exercise Plan [id number]: H/L Clams with RTB, SKTC, Figure 4 S, S/L Thoracic Rot'n    Consulted and Agree with Plan  of Care Patient           Patient will benefit from skilled therapeutic intervention in order to improve the following deficits and impairments:  Difficulty walking, Decreased balance, Decreased activity tolerance, Decreased strength,  Increased fascial restricitons, Impaired flexibility, Postural dysfunction, Pain, Improper body mechanics, Impaired perceived functional ability, Decreased range of motion, Hypomobility  Visit Diagnosis: Chronic bilateral low back pain without sciatica  Abnormal posture  Pain in right hip  Muscle weakness (generalized)     Problem List Patient Active Problem List   Diagnosis Date Noted  . Dysuria 11/22/2019  . 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  . Essential hypertension 02/19/2019  . Hyperlipidemia 02/19/2019  . Palpitations 02/19/2019  . Shortness of breath 02/19/2019  . Hypocalcemia 12/03/2018  . Other hypoparathyroidism (HCC) 12/03/2018  . Pseudophakia of both eyes 04/23/2018  . Posterior capsular opacification of both eyes, 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  . 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 Hypoparathyroidism (HCC) 12/03/2017  . Osteoarthritis of right hip 05/16/2016  . Diabetes mellitus without complication (HCC) 04/18/2016    Marcelline Mates, PT, DPT 12/03/2019, 4:52 PM  [facility] [address] [place], [place], [address] Phone: [phone]   Fax:  [phone]  Name: Norma Clay MRN: [id number] Date of Birth: 01-23-1950

## 2019-12-04 DIAGNOSIS — Z419 Encounter for procedure for purposes other than remedying health state, unspecified: Secondary | ICD-10-CM | POA: Diagnosis not present

## 2019-12-05 ENCOUNTER — Other Ambulatory Visit: Payer: Self-pay | Admitting: Pharmacist

## 2019-12-05 MED ORDER — ACCU-CHEK SOFTCLIX LANCETS MISC: 2 refills | Status: DC

## 2019-12-05 MED ORDER — ACCU-CHEK GUIDE VI STRP: ORAL_STRIP | 2 refills | Status: DC

## 2019-12-05 MED ORDER — ACCU-CHEK GUIDE ME W/DEVICE KIT: PACK | 0 refills | Status: DC

## 2019-12-05 MED FILL — ACCU-CHEK SOFTCLIX LANCETS: 33 days supply | Qty: 100 | Fill #0

## 2019-12-05 MED FILL — metFORMIN HCL 850 MG TABS: 850 | 30 days supply | Qty: 60 | Fill #3

## 2019-12-05 MED FILL — ACCU-CHEK GUIDE W/DEVICE KI: W/DEVICE | 30 days supply | Qty: 1 | Fill #0

## 2019-12-05 MED FILL — CALCITRIOL 0.25 MCG CAPS: 0.25 | 30 days supply | Qty: 60 | Fill #10

## 2019-12-05 MED FILL — ACCU-CHEK GUIDE TEST STRIP: 33 days supply | Qty: 100 | Fill #0

## 2019-12-05 MED FILL — GLIMEPIRIDE 4 MG TABS: 4 | 30 days supply | Qty: 15 | Fill #2

## 2019-12-05 MED FILL — ATORVASTATIN CALCIUM 20 MG: 20 | 30 days supply | Qty: 30 | Fill #1

## 2019-12-05 MED FILL — AMLODIPINE BESYLATE 5 MG TA: 5 | 30 days supply | Qty: 30 | Fill #1

## 2019-12-05 MED FILL — LEVOTHYROXINE SODIUM 100 MC: 100 | 30 days supply | Qty: 30 | Fill #2

## 2019-12-05 MED FILL — LOSARTAN POTASSIUM 25 MG TA: 25 | 30 days supply | Qty: 30 | Fill #7

## 2019-12-13 ENCOUNTER — Ambulatory Visit
Admission: RE | Admit: 2019-12-13 | Discharge: 2019-12-13 | Disposition: A | Discharge status: Home / Self Care | Payer: Medicaid Other | Source: Ambulatory Visit | Attending: Internal Medicine | Admitting: Internal Medicine

## 2019-12-13 DIAGNOSIS — E041 Nontoxic single thyroid nodule: Secondary | ICD-10-CM | POA: Diagnosis not present

## 2019-12-13 DIAGNOSIS — E892 Postprocedural hypoparathyroidism: Secondary | ICD-10-CM

## 2019-12-15 NOTE — Progress Notes (Signed)
Subjective:    Patient ID: Norma Clay, female    DOB: 19-Jun-1950, 69 y.o.   MRN: [id number]  69 y.o. F  Here to re est pcp former [facility] pt The visit is assisted by Norma Clay the patient's son who preserved as interpreter Htn T2DM Hypoparathyroidism surgical s/p partial thyroidectomy in [place] years ago.  This is a former primary care patient of Dr. Chapman Fitch here to establish.  Patient's not been seen in the clinic since May of this year.  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  The patient's been referred to physical therapy and was given a short course of tramadol.  The patient states her pain in the back is still there but is better since the physical therapy.  Also there is history of parathyroidectomy and thyroidectomy in the past.  Follow-up ultrasound of blood is left of the thyroid shows a residual right lobe of the thyroid with a nodule seen present.  This was ordered by endocrinology and this will need to be followed up upon.  Note the patient does need a mammogram Pneumovax flu vaccine and A1c along with foot exam and colon cancer screening  The patient was given the flu vaccine and Pneumovax in this visit and the A1c on arrival was at 7.7 with blood glucose of 272.  The patient states through her son the interpreter that she has trouble with maintaining her Metformin dosing and often skips doses also her glimepiride is supposed to be at meals and she often skips these doses as well.  Her blood glucose often is in the 90-80 range late in the afternoon but note today she had a fairly heavy carbohydrate meal and her blood glucose is 272  Note on arrival blood pressure 108/72 and the patient is compliant with her blood pressure medications.  The patient has a myriad of complaints including pain in the right ear, scalp irritation, pain in the hip and back, wanting to know what the ultrasound of the throat  showed.  Also the patient has bilateral foot pain is seen podiatry in the past has hammertoe in the second digit of both feet and was prescribed orthotics with the patient cannot afford that now the patient has Medicaid and may be able to afford some type of special shoe.  Note she arrives with thick socks and house shoes  The son does state the patient had a Covid vaccine is actually recently had her booster shot she received the [place] vaccine series     Past Medical History:  Diagnosis Date  . Diabetes mellitus without complication ([place])   . Glaucoma   . Hypertension   . Posterior capsular opacification of both eyes, 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     Family History  Problem Relation Age of Onset  . Diabetes Mother   . Colon cancer Neg Hx   . Stomach cancer Neg Hx   . Esophageal cancer Neg Hx      Social History   Socioeconomic History  . Marital status: Widowed    Spouse name: Not on file  . Number of children: Not on file  . Years of education: Not on file  . Highest education level: Not on file  Occupational History  . Not on file  Tobacco Use  . Smoking status: Never Smoker  . Smokeless tobacco: Never Used  Vaping Use  . Vaping Use: Never used  Substance and Sexual Activity  .  Alcohol use: No  . Drug use: No  . Sexual activity: Not Currently  Other Topics Concern  . Not on file  Social History Narrative  . Not on file   Social Determinants of Health   Financial Resource Strain: Not on file  Food Insecurity: Not on file  Transportation Needs: Not on file  Physical Activity: Not on file  Stress: Not on file  Social Connections: Not on file  Intimate Partner Violence: Not on file     Allergies  Allergen Reactions  . Clindamycin/Lincomycin Rash  . Penicillins Rash    Patient was placed on a form of penicillin by her dentist and developed a rash therefore medication was changed to clindamycin.  Per son, patient is not allergic to clindamycin      Outpatient Medications Prior to Visit  Medication Sig Dispense Refill  . Accu-Chek Softclix Lancets lancets Use as instructedUse to check TID. 100 each 2  . acetaminophen (TYLENOL) 325 MG tablet Take 1-2 tablets (325-650 mg total) by mouth every 6 (six) hours as needed for moderate pain. 60 tablet 0  . Blood Glucose Monitoring Suppl (ACCU-CHEK GUIDE ME) w/Device KIT Use to check TID. 1 kit 0  . cetirizine (ZYRTEC ALLERGY) 10 MG tablet Take 1 tablet (10 mg total) by mouth daily. 30 tablet 0  . diclofenac Sodium (VOLTAREN) 1 % GEL RUB 2 GRAMS INTO AFFECTED AREA OF FOOT 2 TO 4 TIMES DAILY 100 g 2  . dorzolamide-timolol (COSOPT) 22.3-6.8 MG/ML ophthalmic solution Place 1 drop into both eyes 2 (two) times daily. 10 mL 0  . glucose blood (ACCU-CHEK GUIDE) test strip Use to check TID. 100 each 2  . Netarsudil Dimesylate 0.02 % SOLN Apply 1 drop to eye every evening.    . Travoprost, BAK Free, (TRAVATAN Z) 0.004 % SOLN ophthalmic solution Apply 1 drop to eye at bedtime.    Marland Kitchen amLODipine (NORVASC) 5 MG tablet Take 1 tablet (5 mg total) by mouth daily. To lower blood pressure 90 tablet 3  . atorvastatin (LIPITOR) 20 MG tablet TAKE 1 TABLET (20 MG TOTAL) BY MOUTH DAILY. 30 tablet 2  . Bismuth Subsalicylate 086 MG TABS Take 1 tablet (262 mg total) by mouth in the morning, at noon, in the evening, and at bedtime. 54 tablet 0  . calcitRIOL (ROCALTROL) 0.25 MCG capsule Take 2 capsules (0.5 mcg total) by mouth daily. 180 capsule 3  . calcium carbonate (OSCAL) 1500 (600 Ca) MG TABS tablet Take 1 tablet (1,500 mg total) by mouth 3 (three) times daily with meals. 360 tablet 1  . Cholecalciferol (VITAMIN D3) 10 MCG (400 UNIT) tablet Take 1 tablet (400 Units total) by mouth daily. 90 tablet 1  . glimepiride (AMARYL) 4 MG tablet One pill once per day before a meal 90 tablet 1  . levothyroxine (SYNTHROID) 100 MCG tablet TAKE 1 TABLET BY MOUTH ONCE A DAY. DO NOT EAT FOR 30 MINUTES AFTER TAKING 90 tablet 1  .  loratadine (CLARITIN) 10 MG tablet Take 1 tablet (10 mg total) by mouth daily. As needed for nasal congestion/drainage 30 tablet 11  . losartan (COZAAR) 25 MG tablet Take 1 tablet (25 mg total) by mouth daily. 90 tablet 3  . metFORMIN (GLUCOPHAGE) 850 MG tablet Take 1 tablet (850 mg total) by mouth 2 (two) times daily with a meal. 180 tablet 2  . metroNIDAZOLE (FLAGYL) 500 MG tablet Take 1 tablet (500 mg total) by mouth 4 (four) times daily. (Patient not taking: Reported on  12/16/2019) 54 tablet 0  . nystatin cream (MYCOSTATIN) Apply 1 application topically 2 (two) times daily. X 10 days then as needed (Patient not taking: Reported on 12/16/2019) 30 g 2  . omeprazole (PRILOSEC) 40 MG capsule Take 1 capsule (40 mg total) by mouth 2 (two) times daily. 60 capsule 0  . pantoprazole (PROTONIX) 40 MG tablet Take 1 tablet (40 mg total) by mouth 2 (two) times daily. 28 tablet 0  . tetracycline (SUMYCIN) 500 MG capsule Take 1 capsule (500 mg total) by mouth 4 (four) times daily. 54 capsule 0  . traMADol (ULTRAM) 50 MG tablet Take 0.5-1 tablets (25-50 mg total) by mouth 3 (three) times daily as needed. 30 tablet 0   No facility-administered medications prior to visit.      Review of Systems  Constitutional: Negative.   HENT: Positive for ear pain. Negative for congestion, dental problem, sinus pain, sneezing, sore throat, tinnitus and trouble swallowing.   Eyes: Negative.   Respiratory: Negative.   Cardiovascular: Negative.   Gastrointestinal: Negative.   Endocrine: Negative.   Genitourinary: Negative.   Musculoskeletal: Positive for back pain and gait problem.       Back pain upper /lower, R hip pain Bilateral foot pain  Skin: Positive for rash.  Hematological: Negative.   Psychiatric/Behavioral: Negative.        Objective:   Physical Exam Vitals:   12/16/19 0901  BP: 108/72  Pulse: 82  Resp: 16  Temp: 98.1 F (36.7 C)  SpO2: 98%  Weight: 168 lb 6.4 oz (76.4 kg)  Height: '5\' 4"'   (1.626 m)    Gen: Pleasant, well-nourished, in no distress,  normal affect  ENT: No lesions,  mouth clear,  oropharynx clear, no postnasal drip  Neck: No JVD, no TMG, no carotid bruits  Lungs: No use of accessory muscles, no dullness to percussion, clear without rales or rhonchi  Cardiovascular: RRR, heart sounds normal, no murmur or gallops, no peripheral edema  Abdomen: soft and NT, no HSM,  BS normal  Musculoskeletal: No deformities, no cyanosis or clubbing  Neuro: alert, non focal  Skin: Warm, dry scalp, oily hair  Foot exam: bilateral hammer toe .  Normal sensation.   No lesions        Assessment & Plan:  I personally reviewed all images and lab data in the [facility] system as well as any outside material available during this office visit and agree with the  radiology impressions.   Essential hypertension Essential hypertension stable at this time  Continue current medications including amlodipine, losartan  Acquired hypothyroidism Acquired hypothyroidism stable at this time continue current dose of levothyroxine  Hyperlipidemia associated with type 2 diabetes mellitus ([place]) Hyperlipidemia stable at this time continue atorvastatin  Post-surgical hypoparathyroidism (HCC) Postsurgical hypoparathyroidism continue calcitriol and calcium supplementation along with vitamin D as per endocrinology  Type 2 diabetes mellitus without complication, without long-term current use of insulin (HCC) Poor control 2 diabetes  Plan continue Metformin 850 mg twice daily and begin Trulicity 0.  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  Diagnoses and all orders for this visit:  Type 2 diabetes mellitus without complication, without long-term current use of insulin (HCC) -     Glucose (CBG) -     HgB A1c -     metFORMIN (GLUCOPHAGE) 850 MG tablet; Take 1 tablet (850 mg total) by mouth 2 (two) times daily with a meal. -     Ambulatory referral to Podiatry  Influenza vaccine needed  Need for immunization against influenza -     Flu Vaccine QUAD 36+ mos IM  Essential hypertension -     amLODipine (NORVASC) 5 MG tablet; Take 1 tablet (5 mg total) by mouth daily. To lower blood pressure -     losartan (COZAAR) 25 MG tablet; Take 1 tablet (25 mg total) by mouth daily.  Hyperlipidemia associated with type 2 diabetes mellitus (HCC) -     atorvastatin (LIPITOR) 20 MG tablet; Take 1 tablet (20 mg total) by mouth daily.  Acquired hypothyroidism -     levothyroxine (SYNTHROID) 100 MCG tablet; TAKE 1 TABLET BY MOUTH ONCE A DAY. 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 -     Cholecalciferol (VITAMIN D3) 10 MCG (400 UNIT) tablet; Take 1 tablet (400 Units total) by mouth daily. -     calcitRIOL (ROCALTROL) 0.25 MCG capsule; Take 2 capsules (0.5 mcg total) by mouth daily. -     Dulaglutide (TRULICITY) 8.18 HU/3.1SH SOPN; Inject 0.75 mg into the skin once a week. -     Pneumococcal polysaccharide vaccine 23-valent greater than or equal to 2yo subcutaneous/IM

## 2019-12-16 ENCOUNTER — Other Ambulatory Visit: Payer: Self-pay

## 2019-12-16 ENCOUNTER — Other Ambulatory Visit: Payer: Self-pay | Admitting: Critical Care Medicine

## 2019-12-16 ENCOUNTER — Encounter: Payer: Self-pay | Admitting: Critical Care Medicine

## 2019-12-16 ENCOUNTER — Ambulatory Visit: Payer: Medicaid Other | Attending: Critical Care Medicine | Admitting: Critical Care Medicine

## 2019-12-16 VITALS — BP 108/72 | HR 82 | Temp 98.1°F | Resp 16 | Ht 64.0 in | Wt 168.4 lb

## 2019-12-16 DIAGNOSIS — H401422 Capsular glaucoma with pseudoexfoliation of lens, left eye, moderate stage: Secondary | ICD-10-CM | POA: Diagnosis not present

## 2019-12-16 DIAGNOSIS — I10 Essential (primary) hypertension: Secondary | ICD-10-CM

## 2019-12-16 DIAGNOSIS — Z961 Presence of intraocular lens: Secondary | ICD-10-CM | POA: Diagnosis not present

## 2019-12-16 DIAGNOSIS — E892 Postprocedural hypoparathyroidism: Secondary | ICD-10-CM

## 2019-12-16 DIAGNOSIS — Z23 Encounter for immunization: Secondary | ICD-10-CM

## 2019-12-16 DIAGNOSIS — E041 Nontoxic single thyroid nodule: Secondary | ICD-10-CM

## 2019-12-16 DIAGNOSIS — E119 Type 2 diabetes mellitus without complications: Secondary | ICD-10-CM | POA: Diagnosis not present

## 2019-12-16 DIAGNOSIS — M4726 Other spondylosis with radiculopathy, lumbar region: Secondary | ICD-10-CM

## 2019-12-16 DIAGNOSIS — Z1231 Encounter for screening mammogram for malignant neoplasm of breast: Secondary | ICD-10-CM | POA: Diagnosis not present

## 2019-12-16 DIAGNOSIS — M47816 Spondylosis without myelopathy or radiculopathy, lumbar region: Secondary | ICD-10-CM | POA: Insufficient documentation

## 2019-12-16 DIAGNOSIS — Z1211 Encounter for screening for malignant neoplasm of colon: Secondary | ICD-10-CM

## 2019-12-16 DIAGNOSIS — M2041 Other hammer toe(s) (acquired), right foot: Secondary | ICD-10-CM | POA: Diagnosis not present

## 2019-12-16 DIAGNOSIS — E785 Hyperlipidemia, unspecified: Secondary | ICD-10-CM | POA: Insufficient documentation

## 2019-12-16 DIAGNOSIS — H6123 Impacted cerumen, bilateral: Secondary | ICD-10-CM

## 2019-12-16 DIAGNOSIS — E039 Hypothyroidism, unspecified: Secondary | ICD-10-CM

## 2019-12-16 DIAGNOSIS — E1169 Type 2 diabetes mellitus with other specified complication: Secondary | ICD-10-CM | POA: Diagnosis not present

## 2019-12-16 DIAGNOSIS — M2042 Other hammer toe(s) (acquired), left foot: Secondary | ICD-10-CM

## 2019-12-16 DIAGNOSIS — H401413 Capsular glaucoma with pseudoexfoliation of lens, right eye, severe stage: Secondary | ICD-10-CM

## 2019-12-16 DIAGNOSIS — H6120 Impacted cerumen, unspecified ear: Secondary | ICD-10-CM | POA: Insufficient documentation

## 2019-12-16 LAB — POCT GLYCOSYLATED HEMOGLOBIN (HGB A1C): HbA1c, POC (controlled diabetic range): 7.7 % — AB (ref 0.0–7.0)

## 2019-12-16 LAB — GLUCOSE, POCT (MANUAL RESULT ENTRY): POC Glucose: 272 mg/dl — AB (ref 70–99)

## 2019-12-16 MED ORDER — AMLODIPINE BESYLATE 5 MG PO TABS: 5.0000 mg | ORAL_TABLET | Freq: Every day | ORAL | 3 refills | Status: DC

## 2019-12-16 MED ORDER — LOSARTAN POTASSIUM 25 MG PO TABS: 25.0000 mg | ORAL_TABLET | Freq: Every day | ORAL | 3 refills | Status: DC

## 2019-12-16 MED ORDER — LEVOTHYROXINE SODIUM 100 MCG PO TABS
ORAL_TABLET | ORAL | 1 refills | Status: DC
Start: 2019-12-16 — End: 2019-12-16

## 2019-12-16 MED ORDER — CALCIUM CARBONATE 1500 (600 CA) MG PO TABS: 600.0000 mg | ORAL_TABLET | Freq: Three times a day (TID) | ORAL | 1 refills | Status: DC

## 2019-12-16 MED ORDER — TRULICITY 0.75 MG/0.5ML ~~LOC~~ SOAJ
0.7500 mg | SUBCUTANEOUS | 4 refills | Status: DC
Start: 2019-12-16 — End: 2020-04-01

## 2019-12-16 MED ORDER — CALCITRIOL 0.25 MCG PO CAPS: 0.5000 ug | ORAL_CAPSULE | Freq: Every day | ORAL | 3 refills | Status: DC

## 2019-12-16 MED ORDER — VITAMIN D3 10 MCG (400 UNIT) PO TABS: 400.0000 [IU] | ORAL_TABLET | Freq: Every day | ORAL | 1 refills | Status: DC

## 2019-12-16 MED ORDER — ATORVASTATIN CALCIUM 20 MG PO TABS: 20.0000 mg | ORAL_TABLET | Freq: Every day | ORAL | 2 refills | Status: DC

## 2019-12-16 MED ORDER — METFORMIN HCL 850 MG PO TABS
850.0000 mg | ORAL_TABLET | Freq: Two times a day (BID) | ORAL | 2 refills | Status: DC
Start: 2019-12-16 — End: 2019-12-16

## 2019-12-16 MED FILL — DORZOLAMIDE-TIMOLOL EYE DRP: 22.3-6.8 | 34 days supply | Qty: 10 | Fill #4

## 2019-12-16 MED FILL — PREDNISOLONE AC 1% EYE DROP: 1 | 34 days supply | Qty: 5 | Fill #6

## 2019-12-16 MED FILL — TRULICITY 0.75 MG/0.5 ML PE: 0.75 | 28 days supply | Qty: 2 | Fill #0

## 2019-12-16 NOTE — Assessment & Plan Note (Signed)
As per other note

## 2019-12-16 NOTE — Assessment & Plan Note (Signed)
Continue physical therapy

## 2019-12-16 NOTE — Patient Instructions (Addendum)
A pneumovax was given  A flu shot was given  Stop glimiperide  Start Trulicity , inject once per week, visit with our clinical pharmacist later    Continue Metformin 858m twice a day with meal  No other medication changes. Please use a pill organizer  Referral to podiatry made  I will discuss with your endocrine doctor about a biopsy of your thyroid nodule  We cleaned your ears out and will likely have you see our Ear doctor later   Use a shampoo for dandruff like head and shoulders shampoo  A colon cancer screen kit to sampler your stool was given to you  Dulaglutide injection What is this medicine? DULAGLUTIDE (DOO la GLOO tide) is used to improve blood sugar control in adults with type 2 diabetes. This medicine may be used with other oral diabetes medicines. This drug may also reduce the risk of heart attack or stroke if you have type 2 diabetes and risk factors for heart disease. This medicine may be used for other purposes; ask your health care provider or pharmacist if you have questions. COMMON BRAND NAME(S): Trulicity What should I tell my health care provider before I take this medicine? They need to know if you have any of these conditions:  endocrine tumors (MEN 2) or if someone in your family had these tumors  eye disease, vision problems  history of pancreatitis  kidney disease  liver disease  stomach problems  thyroid cancer or if someone in your family had thyroid cancer  an unusual or allergic reaction to dulaglutide, other medicines, foods, dyes, or preservatives  pregnant or trying to get pregnant  breast-feeding How should I use this medicine? This medicine is for injection under the skin of your upper leg (thigh), stomach area, or upper arm. It is usually given once every week (every 7 days). You will be taught how to prepare and give this medicine. Use exactly as directed. Take your medicine at regular intervals. Do not take it more often than  directed. If you use this medicine with insulin, you should inject this medicine and the insulin separately. Do not mix them together. Do not give the injections right next to each other. Change (rotate) injection sites with each injection. It is important that you put your used needles and syringes in a special sharps container. Do not put them in a trash can. If you do not have a sharps container, call your pharmacist or healthcare provider to get one. A special MedGuide will be given to you by the pharmacist with each prescription and refill. Be sure to read this information carefully each time. This drug comes with INSTRUCTIONS FOR USE. Ask your pharmacist for directions on how to use this drug. Read the information carefully. Talk to your pharmacist or health care provider if you have questions. Talk to your pediatrician regarding the use of this medicine in children. Special care may be needed. Overdosage: If you think you have taken too much of this medicine contact a poison control center or emergency room at once. NOTE: This medicine is only for you. Do not share this medicine with others. What if I miss a dose? If you miss a dose, take it as soon as you can within 3 days after the missed dose. Then take your next dose at your regular weekly time. If it has been longer than 3 days after the missed dose, do not take the missed dose. Take the next dose at your regular time. Do  not take double or extra doses. If you have questions about a missed dose, contact your health care provider for advice. What may interact with this medicine?  other medicines for diabetes Many medications may cause changes in blood sugar, these include:  alcohol containing beverages  antiviral medicines for HIV or AIDS  aspirin and aspirin-like drugs  certain medicines for blood pressure, heart disease, irregular heart beat  chromium  diuretics  female hormones, such as estrogens or progestins, birth control  pills  fenofibrate  gemfibrozil  isoniazid  lanreotide  female hormones or anabolic steroids  MAOIs like Carbex, Eldepryl, Marplan, Nardil, and Parnate  medicines for weight loss  medicines for allergies, asthma, cold, or cough  medicines for depression, anxiety, or psychotic disturbances  niacin  nicotine  NSAIDs, medicines for pain and inflammation, like ibuprofen or naproxen  octreotide  pasireotide  pentamidine  phenytoin  probenecid  quinolone antibiotics such as ciprofloxacin, levofloxacin, ofloxacin  some herbal dietary supplements  steroid medicines such as prednisone or cortisone  sulfamethoxazole; trimethoprim  thyroid hormones Some medications can hide the warning symptoms of low blood sugar (hypoglycemia). You may need to monitor your blood sugar more closely if you are taking one of these medications. These include:  beta-blockers, often used for high blood pressure or heart problems (examples include atenolol, metoprolol, propranolol)  clonidine  guanethidine  reserpine This list may not describe all possible interactions. Give your health care provider a list of all the medicines, herbs, non-prescription drugs, or dietary supplements you use. Also tell them if you smoke, drink alcohol, or use illegal drugs. Some items may interact with your medicine. What should I watch for while using this medicine? Visit your doctor or health care professional for regular checks on your progress. Drink plenty of fluids while taking this medicine. Check with your doctor or health care professional if you get an attack of severe diarrhea, nausea, and vomiting. The loss of too much body fluid can make it dangerous for you to take this medicine. A test called the HbA1C (A1C) will be monitored. This is a simple blood test. It measures your blood sugar control over the last 2 to 3 months. You will receive this test every 3 to 6 months. Learn how to check your blood  sugar. Learn the symptoms of low and high blood sugar and how to manage them. Always carry a quick-source of sugar with you in case you have symptoms of low blood sugar. Examples include hard sugar candy or glucose tablets. Make sure others know that you can choke if you eat or drink when you develop serious symptoms of low blood sugar, such as seizures or unconsciousness. They must get medical help at once. Tell your doctor or health care professional if you have high blood sugar. You might need to change the dose of your medicine. If you are sick or exercising more than usual, you might need to change the dose of your medicine. Do not skip meals. Ask your doctor or health care professional if you should avoid alcohol. Many nonprescription cough and cold products contain sugar or alcohol. These can affect blood sugar. Pens should never be shared. Even if the needle is changed, sharing may result in passing of viruses like hepatitis or HIV. Wear a medical ID bracelet or chain, and carry a card that describes your disease and details of your medicine and dosage times. What side effects may I notice from receiving this medicine? Side effects that you should report  to your doctor or health care professional as soon as possible:  allergic reactions like skin rash, itching or hives, swelling of the face, lips, or tongue  breathing problems  changes in vision  diarrhea that continues or is severe  lump or swelling on the neck  severe nausea  signs and symptoms of infection like fever or chills; cough; sore throat; pain or trouble passing urine  signs and symptoms of low blood sugar such as feeling anxious, confusion, dizziness, increased hunger, unusually weak or tired, sweating, shakiness, cold, irritable, headache, blurred vision, fast heartbeat, loss of consciousness  signs and symptoms of kidney injury like trouble passing urine or change in the amount of urine  trouble swallowing  unusual  stomach upset or pain  vomiting Side effects that usually do not require medical attention (report to your doctor or health care professional if they continue or are bothersome):  diarrhea  loss of appetite  nausea  pain, redness, or irritation at site where injected  stomach upset This list may not describe all possible side effects. Call your doctor for medical advice about side effects. You may report side effects to FDA at 1-800-FDA-1088. Where should I keep my medicine? Keep out of the reach of children. Store unopened pens in a refrigerator between 2 and 8 degrees C (36 and 46 degrees F). Do not freeze or use if the medicine has been frozen. Protect from light and excessive heat. Store in the carton until use. Each single-dose pen can be kept at room temperature, not to exceed 30 degrees C (86 degrees F) for a total of 14 days, if needed. Throw away any unused medicine after the expiration date on the label. NOTE: This sheet is a summary. It may not cover all possible information. If you have questions about this medicine, talk to your doctor, pharmacist, or health care provider.  2020 Elsevier/Gold Standard (2018-09-04 09:34:53)        Influenza Virus Vaccine injection (Fluarix) What is this medicine? INFLUENZA VIRUS VACCINE (in floo EN zuh VAHY ruhs vak SEEN) helps to reduce the risk of getting influenza also known as the flu. This medicine may be used for other purposes; ask your health care provider or pharmacist if you have questions. COMMON BRAND NAME(S): Fluarix, Fluzone What should I tell my health care provider before I take this medicine? They need to know if you have any of these conditions:  bleeding disorder like hemophilia  fever or infection  Guillain-Barre syndrome or other neurological problems  immune system problems  infection with the human immunodeficiency virus (HIV) or AIDS  low blood platelet counts  multiple sclerosis  an unusual or  allergic reaction to influenza virus vaccine, eggs, chicken proteins, latex, gentamicin, other medicines, foods, dyes or preservatives  pregnant or trying to get pregnant  breast-feeding How should I use this medicine? This vaccine is for injection into a muscle. It is given by a health care professional. A copy of Vaccine Information Statements will be given before each vaccination. Read this sheet carefully each time. The sheet may change frequently. Talk to your pediatrician regarding the use of this medicine in children. Special care may be needed. Overdosage: If you think you have taken too much of this medicine contact a poison control center or emergency room at once. NOTE: This medicine is only for you. Do not share this medicine with others. What if I miss a dose? This does not apply. What may interact with this medicine?  chemotherapy  or radiation therapy  medicines that lower your immune system like etanercept, anakinra, infliximab, and adalimumab  medicines that treat or prevent blood clots like warfarin  phenytoin  steroid medicines like prednisone or cortisone  theophylline  vaccines This list may not describe all possible interactions. Give your health care provider a list of all the medicines, herbs, non-prescription drugs, or dietary supplements you use. Also tell them if you smoke, drink alcohol, or use illegal drugs. Some items may interact with your medicine. What should I watch for while using this medicine? Report any side effects that do not go away within 3 days to your doctor or health care professional. Call your health care provider if any unusual symptoms occur within 6 weeks of receiving this vaccine. You may still catch the flu, but the illness is not usually as bad. You cannot get the flu from the vaccine. The vaccine will not protect against colds or other illnesses that may cause fever. The vaccine is needed every year. What side effects may I notice  from receiving this medicine? Side effects that you should report to your doctor or health care professional as soon as possible:  allergic reactions like skin rash, itching or hives, swelling of the face, lips, or tongue Side effects that usually do not require medical attention (report to your doctor or health care professional if they continue or are bothersome):  fever  headache  muscle aches and pains  pain, tenderness, redness, or swelling at site where injected  weak or tired This list may not describe all possible side effects. Call your doctor for medical advice about side effects. You may report side effects to FDA at 1-800-FDA-1088. Where should I keep my medicine? This vaccine is only given in a clinic, pharmacy, doctor's office, or other health care setting and will not be stored at home. NOTE: This sheet is a summary. It may not cover all possible information. If you have questions about this medicine, talk to your doctor, pharmacist, or health care provider.  2020 Elsevier/Gold Standard (2007-07-18 09:30:40)

## 2019-12-16 NOTE — Assessment & Plan Note (Signed)
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

## 2019-12-16 NOTE — Assessment & Plan Note (Signed)
Cerumen impaction in both ears which was successfully cleaned out at this visit per nursing using Debrox

## 2019-12-16 NOTE — Assessment & Plan Note (Signed)
Hyperlipidemia stable at this time continue atorvastatin

## 2019-12-16 NOTE — Assessment & Plan Note (Signed)
Hammertoes of both feet Referral to podiatry

## 2019-12-16 NOTE — Assessment & Plan Note (Signed)
Colon cancer screening with fecal occult study

## 2019-12-16 NOTE — Assessment & Plan Note (Signed)
Poor control 2 diabetes  Plan continue Metformin 850 mg twice daily and begin Trulicity 0.  7 5 mg weekly and discontinue further omeprazole I had  Follow-up with clinical pharmacist for education

## 2019-12-16 NOTE — Assessment & Plan Note (Signed)
Flu vaccine given at this visit. 

## 2019-12-16 NOTE — Assessment & Plan Note (Signed)
Residual nodule in residual right lobe of thyroid that was not completely excised  Will need to be biopsied will confer with endocrinology

## 2019-12-16 NOTE — Assessment & Plan Note (Signed)
Acquired hypothyroidism stable at this time continue current dose of levothyroxine

## 2019-12-16 NOTE — Assessment & Plan Note (Signed)
Per ophthalmology  

## 2019-12-16 NOTE — Assessment & Plan Note (Signed)
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

## 2019-12-16 NOTE — Assessment & Plan Note (Signed)
Essential hypertension stable at this time  Continue current medications including amlodipine, losartan

## 2019-12-17 ENCOUNTER — Ambulatory Visit: Payer: Medicaid Other | Admitting: Neurology

## 2019-12-17 ENCOUNTER — Encounter: Payer: Self-pay | Admitting: Neurology

## 2019-12-17 ENCOUNTER — Encounter: Payer: Self-pay | Admitting: Physical Therapy

## 2019-12-17 ENCOUNTER — Ambulatory Visit: Payer: Medicare Other | Attending: Family Medicine | Admitting: Physical Therapy

## 2019-12-17 VITALS — BP 139/74 | HR 86 | Ht 64.0 in | Wt 168.0 lb

## 2019-12-17 DIAGNOSIS — M25551 Pain in right hip: Secondary | ICD-10-CM

## 2019-12-17 DIAGNOSIS — R293 Abnormal posture: Secondary | ICD-10-CM | POA: Insufficient documentation

## 2019-12-17 DIAGNOSIS — M545 Low back pain, unspecified: Secondary | ICD-10-CM | POA: Insufficient documentation

## 2019-12-17 DIAGNOSIS — M6281 Muscle weakness (generalized): Secondary | ICD-10-CM | POA: Diagnosis not present

## 2019-12-17 DIAGNOSIS — G8929 Other chronic pain: Secondary | ICD-10-CM | POA: Insufficient documentation

## 2019-12-17 DIAGNOSIS — R413 Other amnesia: Secondary | ICD-10-CM | POA: Diagnosis not present

## 2019-12-17 NOTE — Patient Instructions (Signed)
You have complaints of memory loss: memory loss or changes in cognitive function can have many reasons and does not always mean you have dementia. Conditions that can contribute to subjective or objective memory loss include: depression, stress, poor sleep from insomnia or sleep apnea, dehydration, fluctuation in blood sugar values, thyroid or electrolyte dysfunction and certain vitamin deficiencies. Dementia can be caused by stroke, brain atherosclerosis or brain vascular disease due to vascular risk factors (smoking, high blood pressure, high cholesterol, obesity and uncontrolled diabetes), certain degenerative brain disorders (including Parkinson's disease and Multiple sclerosis) and by Alzheimer's disease or other, more rare and sometimes hereditary causes. We will do some additional testing: blood work (some has been done recently already) and we will do a brain scan. We will not start medication as yet.  We will also consider a formal cognitive test called neuropsychological evaluation which is done by a licensed neuropsychologist. We will call you with brain scan test results and monitor your symptoms. We will make a follow up appointment.  As discussed, I recommend doing a sleep study to evaluate you for obstructive sleep apnea.  Having untreated obstructive sleep apnea may affect your sleep quality and also your memory.  If you change your mind about sleep testing, let me know.

## 2019-12-17 NOTE — Therapy (Signed)
[facility] [address] [place], [place], [address] Phone: [phone]   Fax:  [phone]  Physical Therapy Treatment  Patient Details  Name: Norma Clay MRN: [id number] Date of Birth: Jan 03, 1951 Referring Provider (PT): Lavada Mesi, MD   Encounter Date: 12/17/2019   PT End of Session - 12/17/19 1507    Visit Number 8    Number of Visits 18    Date for PT Re-Evaluation 01/25/19    Authorization Type [employer]    PT Start Time 1506   interpreter was late   PT Stop Time 1548    PT Time Calculation (min) 42 min    Activity Tolerance Patient tolerated treatment well    Behavior During Therapy [facility] for tasks assessed/performed           Past Medical History:  Diagnosis Date   Diabetes mellitus without complication (HCC)    Glaucoma    Hypertension    Posterior capsular opacification of both eyes, 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    Past Surgical History:  Procedure Laterality Date   GLAUCOMA SURGERY     THYROIDECTOMY      There were no vitals filed for this visit.   Subjective Assessment - 12/17/19 1518    Subjective I have pain in back and hip.  It bothers me when I am trying to pick up somethings    Pertinent History Falls, Hysterectomy    Limitations Standing;Walking;Lifting;House hold activities    Patient Stated Goals Reduce pain    Currently in Pain? Yes    Pain Score 6     Pain Location Back    Pain Orientation Right;Lower    Pain Descriptors / Indicators Aching    Pain Type Chronic pain    Pain Onset More than a month ago    Pain Frequency Intermittent    Pain Score 5    Pain Location Hip    Pain Orientation Right    Pain Descriptors / Indicators Aching    Pain Type Chronic pain                             [facility] Adult PT Treatment/Exercise - 12/17/19 0001      Exercises   Exercises Knee/Hip      Lumbar Exercises: Aerobic   Nustep L6 UE/LE 5'      Lumbar Exercises:  Seated   Sit to Stand Limitations hip hinge with dowel rod and the with external cues against wall,    Other Seated Lumbar Exercises wall slides with UE bil . 2 x 10 , then alternating UE ,  5 x with opp ue wall slide and opp hip ext. only 2 x due to pain    Other Seated Lumbar Exercises sit to stand 10 x 2 with sink squat. extra time and VC adn cushion in chari for elevated surface and complete AROM for sit to stand      Lumbar Exercises: Supine   Pelvic Tilt 10 reps;5 seconds    Pelvic Tilt Limitations VD and TC    Bridge with [employer] 15 reps    Bridge with [employer] Limitations small ROM      Manual Therapy   Manual Therapy Soft tissue mobilization;Joint mobilization    Joint Mobilization sidelying lumbar mobs on Rt and then LT with overpressure thoracic PA's grade 2/3, Lumbar UPA on right lumbar grade 2/3 as tolerated    Soft tissue mobilization Left  QL and LEft hip STW in prone,                  PT Education - 12/17/19 1528    Education Details updated HEP with sit to stand, wall slides and hip hinge    Person(s) Educated Patient;Other (comment)   interpreter   Methods Explanation;Demonstration;Tactile cues;Verbal cues;Handout    Comprehension Verbalized understanding;Returned demonstration            PT Short Term Goals - 11/15/19 1338      PT SHORT TERM GOAL #1   Title Pt will be I and compliant with initial HEP.    Status Achieved      PT SHORT TERM GOAL #2   Title Pt will report ability to stand for 10 minutes to help around the house.    Status Achieved             PT Long Term Goals - 12/17/19 1713      PT LONG TERM GOAL #1   Title Pt will be I and compliant with long term HEP for progression and maintenance of condition.    Baseline working on HEP advanced    Time 6    Period Weeks    Status On-going      PT LONG TERM GOAL #2   Title Pt will demo at least 4/5 hip MMT for improved stability of pelvis and lumbar spine.    Time 6     Period Weeks    Status Unable to assess      PT LONG TERM GOAL #3   Title Pt will score 45/56 on Berg Balance test, in order to demonstrate decreased fall risk.    Time 6    Period Weeks    Status Unable to assess                 Plan - 12/17/19 1508    Clinical Impression Statement Pt with pain on entereing clinic.  Pt with movement decreased back pain to 4 and hip pain to 4/10.  Pt with interpreter who came late but fully participated and engaging of pt.  Ms Krzywicki with more even step length after RX and more aware of posture.  Added Hip hinge and sit to stand for strengthening. Will contin  POC    Personal Factors and Comorbidities Age;Comorbidity 3+;Time since onset of injury/illness/exacerbation;Past/Current Experience;Fitness    Comorbidities HTN, DM, Glaucoma    Examination-Activity Limitations Lift;Squat;Bend;Caring for Others;Stand;Reach Overhead;Locomotion Level    Examination-Participation Restrictions Interpersonal Relationship;Cleaning;Laundry;Meal Prep;Community Activity    PT Frequency 2x / week    PT Duration 6 weeks    PT Treatment/Interventions ADLs/Self Care Home Management;Moist Heat;Electrical Stimulation;Cryotherapy;Joint Manipulations;Spinal Manipulations;Passive range of motion;Dry needling;Manual techniques;Patient/family education;Therapeutic exercise;Balance training;Neuromuscular re-education;Therapeutic activities;Functional mobility training;Gait training    PT Next Visit Plan Assess response to HEP and update PRN, hip and core strength, balance; general standing strengthening exercises, manual as indicated for pain and soft tissue/joint restrictions; progress R quad/hip flexor extensibility to tolerate R hip extension    PT Home Exercise Plan [email or web address]: H/L Clams with RTB, SKTC, Figure 4 S, S/L Thoracic Rot'n    Consulted and Agree with Plan of Care Patient           Patient will benefit from skilled therapeutic intervention in order to improve the  following deficits and impairments:  Difficulty walking,Decreased balance,Decreased activity tolerance,Decreased strength,Increased fascial restricitons,Impaired flexibility,Postural dysfunction,Pain,Improper body mechanics,Impaired perceived functional ability,Decreased range of motion,Hypomobility  Visit Diagnosis: Chronic  bilateral low back pain without sciatica  Pain in right hip  Muscle weakness (generalized)  Access Code: [email or web address]: [email or web address]: 12/14/2021Prepared by: Wayland Denis BeardsleyExercises   Sit to Stand with Counter Support - 1 x daily - 7 x weekly - 3 sets - 10 reps  Standing Hip Hinge with Dowel - 1 x daily - 7 x weekly - 3 sets - 10 reps    Problem List Patient Active Problem List   Diagnosis Date Noted   Colon cancer screening 12/16/2019   Acquired hypothyroidism 12/16/2019   Hyperlipidemia associated with type 2 diabetes mellitus (HCC) 12/16/2019   Hammer toes of both feet 12/16/2019   Thyroid nodule 12/16/2019   Cerumen impaction 12/16/2019   Degenerative arthritis of lumbar spine 12/16/2019   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   Essential hypertension 02/19/2019   Postsurgical states following surgery of eye and adnexa 10/01/2018   Pseudophakia of both eyes 04/23/2018   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 [place], [place], [address] Phone: [phone]    Fax:  [phone]  Name: Charnay Nazario MRN: [id number] Date of Birth: 01/06/1950

## 2019-12-17 NOTE — Patient Instructions (Addendum)
° °  1) wall slide arms together 2) wall slide arms opposite 3/wall slide and oppsite hip kick back     Garen Lah, PT, [facility] Certified Exercise Expert for the Aging Adult  12/17/19 3:28 PM Phone: [phone] Fax: [phone]

## 2019-12-17 NOTE — Progress Notes (Signed)
Subjective:    Patient ID: Norma Clay is a 69 y.o. female.  HPI     Star Age, MD, PhD [facility] [address], [address].O[address] [place], [place] [address]  Dear Dr. Chapman Fitch,   I saw your patient, Norma Clay, upon your kind request in my neurologic clinic today for initial consultation of her memory loss.  Patient is accompanied by her son today and Arabic interpreter as well.  As you know, Norma Clay is a 69 year old right-handed woman with an underlying medical history of glaucoma, hypertension, Arthritis, low back pain, diabetes and overweight state, who reports very little to no history of her own. Per son, she has had memory loss since 2014, worse over the past 3 years. She has been forgetful for recent events and conversations but also   I reviewed your office note from 05/10/2019.   I reviewed her recent blood test results in the chart.  Vitamin D level on 07/26/2019 was mildly below normal at 28, improved when rechecked on 11/22/2019, at 44.  TSH was normal at 0.36 on 11/22/2019.  BMP showed blood sugar of 179, BUN 31, creatinine 0.95.  A1c on 12/16/2019 was 7.7.  She lives with her son and his family.  He is widowed.  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  She does have some anxiety issues per son and sometimes her mood is low or depressed but not sustained.  She does not sleep very well.  She snores, she does not wake up with a headache typically.  She has not noted to have pauses in her breathing while asleep.  She has never had a sleep study but did have a tonsillectomy as a child.  She has a family history of memory loss affecting her father.  Sometimes her memory function fluctuates quite a bit, she may remember something really well and then completely deny knowledge of something even something very important such as her engagement to her late husband.  She is a non-smoker, drinks tea  occasionally in terms of caffeine and does not drink any alcohol. She has joint pain, particularly right hip arthritis, she also has low back pain and knee pain and foot pain.  Her Past Medical History Is Significant For: Past Medical History:  Diagnosis Date  . Diabetes mellitus without complication ([place])   . Glaucoma   . Hypertension   . Posterior capsular opacification of both eyes, 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    Her Past Surgical History Is Significant For: Past Surgical History:  Procedure Laterality Date  . GLAUCOMA SURGERY    . THYROIDECTOMY      Her Family History Is Significant For: Family History  Problem Relation Age of Onset  . Diabetes Mother   . Colon cancer Neg Hx   . Stomach cancer Neg Hx   . Esophageal cancer Neg Hx     Her Social History Is Significant For: Social History   Socioeconomic History  . Marital status: Widowed    Spouse name: Not on file  . Number of children: Not on file  . Years of education: Not on file  . Highest education level: Not on file  Occupational History  . Not on file  Tobacco Use  . Smoking status: Never Smoker  . Smokeless tobacco: Never Used  Vaping Use  . Vaping Use: Never used  Substance and Sexual Activity  . Alcohol use: No  . Drug  use: No  . Sexual activity: Not Currently  Other Topics Concern  . Not on file  Social History Narrative  . Not on file   Social Determinants of Health   Financial Resource Strain: Not on file  Food Insecurity: Not on file  Transportation Needs: Not on file  Physical Activity: Not on file  Stress: Not on file  Social Connections: Not on file    Her Allergies Are:  Allergies  Allergen Reactions  . Clindamycin/Lincomycin Rash  . Penicillins Rash    Patient was placed on a form of penicillin by her dentist and developed a rash therefore medication was changed to clindamycin.  Per son, patient is not allergic to clindamycin  :   Her Current Medications Are:  Outpatient  Encounter Medications as of 12/17/2019  Medication Sig  . Accu-Chek Softclix Lancets lancets Use as instructedUse to check TID.  Marland Kitchen acetaminophen (TYLENOL) 325 MG tablet Take 1-2 tablets (325-650 mg total) by mouth every 6 (six) hours as needed for moderate pain.  Marland Kitchen amLODipine (NORVASC) 5 MG tablet Take 1 tablet (5 mg total) by mouth daily. To lower blood pressure  . atorvastatin (LIPITOR) 20 MG tablet Take 1 tablet (20 mg total) by mouth daily.  . Blood Glucose Monitoring Suppl (ACCU-CHEK GUIDE ME) w/Device KIT Use to check TID.  Marland Kitchen Blood Glucose Monitoring Suppl (ACCU-CHEK GUIDE) w/Device KIT USE TO CHECK 3 TIMES DAILY  . calcitRIOL (ROCALTROL) 0.25 MCG capsule Take 2 capsules (0.5 mcg total) by mouth daily.  . calcium carbonate (OSCAL) 1500 (600 Ca) MG TABS tablet Take 1 tablet (1,500 mg total) by mouth 3 (three) times daily with meals.  . cetirizine (ZYRTEC ALLERGY) 10 MG tablet Take 1 tablet (10 mg total) by mouth daily.  . Cholecalciferol (VITAMIN D3) 10 MCG (400 UNIT) tablet Take 1 tablet (400 Units total) by mouth daily.  . diclofenac Sodium (VOLTAREN) 1 % GEL RUB 2 GRAMS INTO AFFECTED AREA OF FOOT 2 TO 4 TIMES DAILY  . dorzolamide-timolol (COSOPT) 22.3-6.8 MG/ML ophthalmic solution Place 1 drop into both eyes 2 (two) times daily.  . Dulaglutide (TRULICITY) 9.23 RA/0.7MA SOPN Inject 0.75 mg into the skin once a week.  Marland Kitchen glucose blood (ACCU-CHEK GUIDE) test strip Use to check TID.  Marland Kitchen levothyroxine (SYNTHROID) 100 MCG tablet TAKE 1 TABLET BY MOUTH ONCE A DAY. DO NOT EAT FOR 30 MINUTES AFTER TAKING  . losartan (COZAAR) 25 MG tablet Take 1 tablet (25 mg total) by mouth daily.  . metFORMIN (GLUCOPHAGE) 850 MG tablet Take 1 tablet (850 mg total) by mouth 2 (two) times daily with a meal.  . prednisoLONE acetate (PRED FORTE) 1 % ophthalmic suspension Place 1 drop into the right eye 2 (two) times daily.  . Travoprost, BAK Free, (TRAVATAN) 0.004 % SOLN ophthalmic solution Apply 1 drop to eye at  bedtime.  . [DISCONTINUED] Netarsudil Dimesylate 0.02 % SOLN Apply 1 drop to eye every evening.   No facility-administered encounter medications on file as of 12/17/2019.  :   Review of Systems:  Out of a complete 14 point review of systems, all are reviewed and negative with the exception of these symptoms as listed below:  Review of Systems  Neurological:       Pt presents today to discuss her memory. Pt's son reports that pt's memory has worsened since her husband died in 03/30/2012.    Objective:  Neurological Exam  Physical Exam Physical Examination:   Vitals:   12/17/19 0752  BP: 139/74  Pulse: 86    General Examination: The patient is a very pleasant 69 y.o. female in no acute distress. She appears well-developed and well-nourished and well groomed.  She is very quiet, provides very little information on her own, she wears house shoes/bedroom slippers.  HEENT: Normocephalic, atraumatic, pupils are equal, round and reactive to light, extraocular tracking is preserved, hearing appears to be intact, face is symmetric with normal facial animation.  Speech is very scant but appears to be clear without dysarthria or hypophonia.  She answers in 1 or 2 word sentences mostly from what I can tell. Neck is supple with full range of passive and active motion. There are no carotid bruits on auscultation. Oropharynx exam reveals: mild mouth dryness, adequate dental hygiene and mild airway crowding.  She is status post tonsillectomy but appears to have had a uvulectomy as well.  She has a smaller airway.  Tongue protrudes centrally and palate elevates symmetrically.   Chest: Clear to auscultation without wheezing, rhonchi or crackles noted.  Heart: S1+S2+0, regular and normal without murmurs, rubs or gallops noted.   Abdomen: Soft, non-tender and non-distended with normal bowel sounds appreciated on auscultation.  Extremities: There is 1+ pitting edema in the distal lower extremities  bilaterally. Pedal pulses are intact.  Skin: Warm and dry without trophic changes noted.  Musculoskeletal: exam reveals joint pain in several areas including right hip, both knees, also reports foot pain and has evidence of hammertoes.    Neurologically:  Mental status: The patient is awake, pink with attention but is not oriented except for self, month of the year, city, and doctor's office.   Evaluation with MMSE is difficult and possibly not very reliable given son talking to the patient and also interpreter talking to the patient On 12/17/2019: MMSE is 10 out of 29, she missed 2 points also on immediate recall, had 0 points on serial sevens, missed 2 points on remote recall, could not name a pencil or a watch, could not repeat a sentence, did not score any points on comprehension.  She does not read [place].  She is unable to write a sentence and unable to copy intersecting pentagons.  She named 3 animals in 1 minute and was unable to put the numbers to draw a clock.   She is only minimally verbal.  Difficult to assess her immediate and remote memory as she does not give her history.   Mood appears restricted, affect appears blunted.  Cranial nerves II - XII are as described above under HEENT exam.  Motor exam: Normal bulk, strength of about 4+ out of 5 throughout, normal tone, no tremor noted at rest or posture.  Romberg is not tested for safety concerns, reflexes are 1+ in the upper extremities, trace in the knees and absent in the ankles, toes are downgoing bilaterally.  Fine motor skills are mildly impaired globally.   Sensory exam: intact to light touch in the upper and lower extremities.  Gait, station and balance: She stands with difficulty and pushes herself up.  She has increase in lumbar kyphosis and also a slightly uneven hip heights, she walks with a significant limp, she has a four-point cane.  Of note, she is wearing house shoes that have no grip in the back around the heels.               Assessment and Plan:   In summary, Daaiyah Baumert is a very pleasant 69 y.o.-year old female with an underlying medical history of  glaucoma, hypertension, Arthritis, low back pain, diabetes and overweight state, who presents for evaluation of her memory loss of several years duration, started after her husband died in March 23, 2012 but has been more progressive apparently in the past 3 years.  History is difficult to tease out.  Memory scores may not be fully reliable.  We talked about the importance of healthy lifestyle.  Sleep apnea may be an underlying concern as well but she declines a sleep study at this time.  She is agreeable to pursuing a brain MRI.  We will do some blood work as well.  Ideally, I would like to also pursue a neuropsychological evaluation, she may have some underlying mood disturbance as well.  I did not suggest any new medications at this time and we plan to follow-up after her MRI and her blood work and we will keep them posted by phone call as well.  Her son is advised to call our office if she changes her mind about pursuing a sleep study.  I explained the diagnosis of obstructive sleep apnea to them as well.  I answered all their questions today and the patient and her son were in agreement.   Thank you very much for allowing me to participate in the care of this nice patient. If I can be of any further assistance to you please do not hesitate to call me at [phone].  Sincerely,   Star Age, MD, PhD

## 2019-12-18 ENCOUNTER — Telehealth: Payer: Self-pay

## 2019-12-18 ENCOUNTER — Telehealth: Payer: Self-pay | Admitting: Neurology

## 2019-12-18 NOTE — Telephone Encounter (Signed)
Call placed to pt son and no answer and no voicemail picked up. Note in chart states that endocrin has informed pt via mychart.

## 2019-12-18 NOTE — Telephone Encounter (Signed)
wellcare medicaid pending

## 2019-12-18 NOTE — Telephone Encounter (Signed)
-----   Message from Storm Frisk, MD sent at 12/16/2019 12:09 PM EST ----- Regarding: FW: thyroid nodule Torrance Frech.  Pls call the pts son , tell him the endocrine MD said she does NOT need a biopsy of the thyroid, she does NOT have cancer.   Dr Delford Field ----- Message ----- From: Orland Penman, MD Sent: 12/16/2019  12:06 PM EST To: Storm Frisk, MD Subject: RE: thyroid nodule                             I messaged her son through my chart earlier this morning.  No need to biopsy. She has no history of thyroid cancer, the reason for thyroid surgery was multinodular goiter . So like you said, its residual tissue  ----- Message ----- From: Storm Frisk, MD Sent: 12/16/2019  11:42 AM EST To: Orland Penman, MD Subject: thyroid nodule                                 Hello Dr Lonzo Cloud  See recent thyroid US of ms [place]  Should we bx her thyroid nodule??  I suppose she has residual thyroid tissue not excised with her [place] surgery hx Danise Mina

## 2019-12-19 ENCOUNTER — Encounter: Payer: Self-pay | Admitting: *Deleted

## 2019-12-19 NOTE — Progress Notes (Signed)
Please call patient's son who speaks [place], her blood work was benign, 2 results are pending which is the vitamin B1 and B6, we will update if abnormal.

## 2019-12-21 LAB — ANA W/REFLEX: Anti Nuclear Antibody (ANA): NEGATIVE

## 2019-12-21 LAB — TSH: TSH: 0.566 u[IU]/mL (ref 0.450–4.500)

## 2019-12-21 LAB — VITAMIN B6: Vitamin B6: 3.3 ug/L (ref 2.0–32.8)

## 2019-12-21 LAB — RHEUMATOID FACTOR: Rheumatoid fact SerPl-aCnc: <10 IU/mL (ref <14.0)

## 2019-12-21 LAB — VITAMIN B1: Thiamine: 125.2 nmol/L (ref 66.5–200.0)

## 2019-12-21 LAB — B12 AND FOLATE PANEL
Folate: 17.2 ng/mL (ref >3.0)
Vitamin B-12: 453 pg/mL (ref 232–1245)

## 2019-12-21 LAB — AMMONIA: Ammonia: 45 ug/dL (ref 34–178)

## 2019-12-21 LAB — C-REACTIVE PROTEIN: CRP: 3 mg/L (ref 0–10)

## 2019-12-21 LAB — RPR: RPR Ser Ql: NONREACTIVE

## 2019-12-23 ENCOUNTER — Ambulatory Visit: Payer: Medicaid Other | Admitting: Podiatry

## 2019-12-24 ENCOUNTER — Encounter: Payer: Self-pay | Admitting: Physical Therapy

## 2019-12-24 ENCOUNTER — Other Ambulatory Visit: Payer: Self-pay

## 2019-12-24 ENCOUNTER — Ambulatory Visit: Payer: Medicare Other | Admitting: Physical Therapy

## 2019-12-24 DIAGNOSIS — G8929 Other chronic pain: Secondary | ICD-10-CM | POA: Diagnosis not present

## 2019-12-24 DIAGNOSIS — M25551 Pain in right hip: Secondary | ICD-10-CM | POA: Diagnosis not present

## 2019-12-24 DIAGNOSIS — R293 Abnormal posture: Secondary | ICD-10-CM | POA: Diagnosis not present

## 2019-12-24 DIAGNOSIS — M6281 Muscle weakness (generalized): Secondary | ICD-10-CM | POA: Diagnosis not present

## 2019-12-24 DIAGNOSIS — M545 Low back pain, unspecified: Secondary | ICD-10-CM | POA: Diagnosis not present

## 2019-12-24 NOTE — Therapy (Signed)
[facility] [address] [place], [place], [address] Phone: [phone]   Fax:  [phone]  Physical Therapy Treatment  Patient Details  Name: Norma Clay MRN: [id number] Date of Birth: 1950-04-26 Referring Provider (PT): Lavada Mesi, MD   Encounter Date: 12/24/2019   PT End of Session - 12/24/19 1701    Visit Number 9    Number of Visits 18    Date for PT Re-Evaluation 01/25/19    Authorization Type [employer] MCD    PT Start Time 1550    PT Stop Time 1635    PT Time Calculation (min) 45 min    Activity Tolerance Patient tolerated treatment well    Behavior During Therapy [facility] for tasks assessed/performed           Past Medical History:  Diagnosis Date   Diabetes mellitus without complication (HCC)    Glaucoma    Hypertension    Posterior capsular opacification of both eyes, 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    Past Surgical History:  Procedure Laterality Date   GLAUCOMA SURGERY     THYROIDECTOMY      There were no vitals filed for this visit.   Subjective Assessment - 12/24/19 1550    Subjective I was only able to do exercise 2 days    Patient is accompained by: Interpreter    Pertinent History Falls, Hysterectomy    Limitations Standing;Walking;Lifting;House hold activities    Patient Stated Goals Reduce pain    Currently in Pain? Yes    Pain Score 5     Pain Location Back    Pain Orientation Right;Lower    Pain Descriptors / Indicators Aching    Pain Type Chronic pain    Pain Onset More than a month ago              [facility] PT Assessment - 12/24/19 0001      Functional Tests   Functional tests Sit to Stand      Sit to Stand   Comments 5 x sts 27.88                         [facility] Adult PT Treatment/Exercise - 12/24/19 0001      Self-Care   Self-Care Other Self-Care Comments    Posture upright posture, quad cane in L hand vs. R    Other Self-Care Comments  walking program  with supervision  explained home walking program and doing 'walking snacks during the day and trying to start walking 5 minutes continuously at a time.   6 min walkiing 398 feet ( normal for age 3 feet      Exercises   Exercises Knee/Hip      Lumbar Exercises: Seated   Other Seated Lumbar Exercises wall slides 2 x 10,  seated full body stretch to right and to left x 5 each    Other Seated Lumbar Exercises sit to stand 2 x 10 with 2 minute rest in between from mat and holding onto back of chair  VC for posture and elongation of trunk      Lumbar Exercises: Supine   Pelvic Tilt 10 reps;5 seconds    Pelvic Tilt Limitations VD and TC    Bridge with [employer] 15 reps    Bridge with [employer] Limitations unable to lift all the way  only 1/4 range      Lumbar Exercises: Sidelying   Clam Limitations clam wiht green t band  2 x 10 on RT then tried supine bil greeen t band 1 x 10    Hip Abduction Right;10 reps    Hip Abduction Limitations required PT for good technique tends to hip flex into abduction.    Other Sidelying Lumbar Exercises thoracic book opening,  5 x 2 hold 10 sec R and then L      Knee/Hip Exercises: Stretches   Hip Flexor Stretch Right;60 seconds;1 rep      Knee/Hip Exercises: Standing   Lateral Step Up 1 set;Left;Right;Hand Hold: 2;Step Height: 6"    Lateral Step Up Limitations VC and supervision    Forward Step Up Hand Hold: 2;1 set;10 reps;Right;Left;Step Height: 6"    Forward Step Up Limitations VC and supervision      Manual Therapy   Manual Therapy Soft tissue mobilization;Passive ROM    Joint Mobilization sidelying lumbar mobs on Rt and then LT with overpressure thoracic PA's grade 2/3, Lumbar UPA on right lumbar grade 2/3 as tolerated    Soft tissue mobilization STM R hip flexors, quads    Passive ROM Rt hip LAD and IR ER overpressure                  PT Education - 12/24/19 1639    Education Details added step ups and walking program     Person(s) Educated Patient    Methods Explanation;Demonstration;Tactile cues;Verbal cues;Handout    Comprehension Verbalized understanding;Returned demonstration            PT Short Term Goals - 11/15/19 1338      PT SHORT TERM GOAL #1   Title Pt will be I and compliant with initial HEP.    Status Achieved      PT SHORT TERM GOAL #2   Title Pt will report ability to stand for 10 minutes to help around the house.    Status Achieved             PT Long Term Goals - 12/24/19 1655      PT LONG TERM GOAL #1   Title Pt will be I and compliant with long term HEP for progression and maintenance of condition.    Baseline working on HEP advanced but only did HEP 2 x since last seen    Time 6    Period Weeks      PT LONG TERM GOAL #2   Title Pt will demo at least 4/5 hip MMT for improved stability of pelvis and lumbar spine.    Baseline 5 x sts 27.33 sec  showing LE weakness and balance issues    Time 6    Period Weeks    Status On-going      PT LONG TERM GOAL #3   Title Pt will score 45/56 on Berg Balance test, in order to demonstrate decreased fall risk.    Baseline 38/56    Time 6    Period Weeks    Status Unable to assess      PT LONG TERM GOAL #4   Title Pt will report decrease in pain by 50%, in order to participate in leisure activities more frequently.    Baseline pain level 5/10 pt planning to go to wedding    Time 6    Period Weeks    Status On-going      PT LONG TERM GOAL #5   Title Pt will be able to stand for up to 20 minutes, in order to perform ADLs/IADLs.  Baseline Pt 6 min walk test fatiguing.    Time 6    Status On-going                 Plan - 12/24/19 1633    Clinical Impression Statement Ms Marijean NiemannYousif enters clinic with quad cane. and reports she has not done exericses only 2 times since last visit.  Pt with 5/10 pain and needs supervision and assistance to complete. Pt 6 minutes walk 398 ft ( normal for age 69 ft)  Pt has stiff Rt hip and  hard end feel.  Pt seems to have pain down her leg but does seem to respond to exercise.  Pt encouraged to do a walking program and not spend time sitting ( has R hip flexion contracture) Encouraged more upright posture and more movement throughout day.  added step ups for adding hip and leg strength since her supine exercises seem to need more supervision to perform properly .  Pt pain decreased to 3/4 out of 10  will continue POC and maximize function and strength in remaininig visits and pair down HEP to essentials for compliance at home    Personal Factors and Comorbidities Age;Comorbidity 3+;Time since onset of injury/illness/exacerbation;Past/Current Experience;Fitness    Comorbidities HTN, DM, Glaucoma    Examination-Activity Limitations Lift;Squat;Bend;Caring for Others;Stand;Reach Overhead;Locomotion Level    Examination-Participation Restrictions Interpersonal Relationship;Cleaning;Laundry;Meal Prep;Community Activity    PT Frequency 2x / week    PT Duration 6 weeks    PT Treatment/Interventions ADLs/Self Care Home Management;Moist Heat;Electrical Stimulation;Cryotherapy;Joint Manipulations;Spinal Manipulations;Passive range of motion;Dry needling;Manual techniques;Patient/family education;Therapeutic exercise;Balance training;Neuromuscular re-education;Therapeutic activities;Functional mobility training;Gait training    PT Next Visit Plan FOTO and may need to redo BERG balance ( been a month since done) Needs progress note 10th visit    PT Home Exercise Plan [email or web address]: H/L Clams with RTB, SKTC, Figure 4 S, S/L Thoracic Rot'n gave walking program to do and squats at sink 3 x a day step ups and lateral step ups    Consulted and Agree with Plan of Care Patient         Access Code: [email or web address]: [email or web address]: 12/21/2021Prepared by: Wayland DenisLawrie BeardsleyExercises    Forward Step Up with Counter Support - 1 x daily - 7 x weekly - 3 sets - 10 reps  Lateral Step Up with  Counter Support - 1 x daily - 7 x weekly - 3 sets - 10 reps   Patient will benefit from skilled therapeutic intervention in order to improve the following deficits and impairments:  Difficulty walking,Decreased balance,Decreased activity tolerance,Decreased strength,Increased fascial restricitons,Impaired flexibility,Postural dysfunction,Pain,Improper body mechanics,Impaired perceived functional ability,Decreased range of motion,Hypomobility  Visit Diagnosis: Chronic bilateral low back pain without sciatica  Pain in right hip  Muscle weakness (generalized)  Abnormal posture     Problem List Patient Active Problem List   Diagnosis Date Noted   Colon cancer screening 12/16/2019   Acquired hypothyroidism 12/16/2019   Hyperlipidemia associated with type 2 diabetes mellitus (HCC) 12/16/2019   Hammer toes of both feet 12/16/2019   Thyroid nodule 12/16/2019   Cerumen impaction 12/16/2019   Degenerative arthritis of lumbar spine 12/16/2019   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   Essential hypertension 02/19/2019   Postsurgical states following surgery of eye and adnexa 10/01/2018   Pseudophakia of both eyes 04/23/2018   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, severe stage 04/23/2018   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

## 2019-12-24 NOTE — Patient Instructions (Signed)
    WALKING  Walking is a great form of exercise to increase your strength, endurance and overall fitness.  A walking program can help you start slowly and gradually build endurance as you go.  Everyone's ability is different, so each person's starting point will be different.  You do not have to follow them exactly.  The are just samples. You should simply find out what's right for you and stick to that program.   In the beginning, you'll start off walking 2-3 times a day for short distances.  As you get stronger, you'll be walking further at just 1-2 times per day.  The bare minimum is 150 minutes a week  Or 30 min 5 x a week.  You need to continuously walk and not have breaks to get stronger.    A. You Can Walk For A Certain Length Of Time Each Day  FOR Norma Clay  Start here    Walk 5 minutes 3 times per day.  Increase 2 minutes every 2 days (3 times per day).  Work up to 25-30 minutes (1-2 times per day).   Example:   Day 1-2 5 minutes 3 times per day   Day 7-8 12 minutes 2-3 times per day   Day 13-14 25 minutes 1-2 times per day  B. You Can Walk For a Certain Distance Each Day     Distance can be substituted for time.    Example:   3 trips to mailbox (at road)   3 trips to corner of block   3 trips around the block  C. Go to local high school and use the track.    Walk for distance ____ around track  Or time ____ minutes  D. Walk _x___ Jog ____ Run ___  Please only do the exercises that your therapist has initialed and dated   Garen Lah, PT, [facility] Certified Exercise Expert for the Aging Adult  12/24/19 4:39 PM Phone: [phone] Fax: [phone]

## 2019-12-24 NOTE — Telephone Encounter (Signed)
[employer] medicaid Berkley Harvey: [id number] (exp. 12/18/19 to 02/16/20) order faxed to [facility] they will reach out to the patient to schedule

## 2019-12-25 ENCOUNTER — Ambulatory Visit: Payer: Medicaid Other | Attending: Critical Care Medicine | Admitting: Pharmacist

## 2019-12-25 ENCOUNTER — Encounter: Payer: Self-pay | Admitting: Pharmacist

## 2019-12-25 DIAGNOSIS — E119 Type 2 diabetes mellitus without complications: Secondary | ICD-10-CM | POA: Diagnosis not present

## 2019-12-25 NOTE — Progress Notes (Signed)
    S:     No chief complaint on file.  Patient arrives in good spirits.  Presents for diabetes evaluation, education, and management. Patient was referred and last seen by Primary Care Provider on 12/16/2019. Dr. Delford Field started Trulicity.    Today, she reports tolerating her Trulicity well. Does endorse some nausea for 1-2 days after Trulicity dose but this improves. Denies abdominal pain. Denies vomiting.  Family/Social History:  - FHx: DM - Tobacco: never smoker   - Alcohol: denies use   Insurance coverage/medication affordability: [place] Medicaid  Medication adherence reported.   Current diabetes medications include: Trulicity 0.75 mg weekly, metformin 850 mg BID   Patient denies hypoglycemic events.  Patient reported dietary habits:  - Son reports that the patient follows a good diabetic diet   Patient-reported exercise habits:  - Limited mobility in this patient    Patient denies nocturia (nighttime urination).  Patient denies neuropathy (nerve pain). Patient denies visual changes. Patient reports self foot exams.     O:  Physical Exam   ROS   Lab Results  Component Value Date   HGBA1C 7.7 (A) 12/16/2019   There were no vitals filed for this visit.  Lipid Panel     Component Value Date/Time   CHOL 138 04/17/2019 0948   TRIG 50 04/17/2019 0948   HDL 68 04/17/2019 0948   CHOLHDL 2.0 04/17/2019 0948   LDLCALC 59 04/17/2019 0948   Home fasting blood sugars: 65 - 105  2 hour post-meal/random blood sugars: 111 - 195. 2 outliers of 217, 218  Clinical Atherosclerotic Cardiovascular Disease (ASCVD): No  The 10-year ASCVD risk score Denman George DC Jr., et al., 2013) is: 23.1%   Values used to calculate the score:     Age: 69 years     Sex: Female     Is Non-Hispanic African American: No     Diabetic: Yes     Tobacco smoker: No     Systolic Blood Pressure: 139 mmHg     Is BP treated: Yes     HDL Cholesterol: 68 mg/dL     Total Cholesterol: 138 mg/dL     A/P: Diabetes longstanding currently close to goal. Home blood sugars look to be mostly at goal. Patient is able to verbalize appropriate hypoglycemia management plan. Medication adherence appears appropriate.  Of note, she is following Endo for thyroid disease. Nodule was noted on recent US with biopsy recommended. Pt with no history of thyroid cancer. Will hold off on increasing dose of Trulicity given nausea.   -Continued current regimen. -Extensively discussed pathophysiology of diabetes, recommended lifestyle interventions, dietary effects on blood sugar control -Counseled on s/sx of and management of hypoglycemia -Next A1C anticipated 03/2020.   Written patient instructions provided.  Total time in face to face counseling 30 minutes.   Follow up PCP Clinic Visit in April.    Butch Penny, PharmD, P[place]y [place], CPP Clinical Pharmacist [facility] [phone]

## 2019-12-25 NOTE — Telephone Encounter (Signed)
schedule for 1/6 245PM

## 2019-12-26 ENCOUNTER — Other Ambulatory Visit: Payer: Self-pay

## 2019-12-26 ENCOUNTER — Ambulatory Visit (INDEPENDENT_AMBULATORY_CARE_PROVIDER_SITE_OTHER): Payer: Medicaid Other

## 2019-12-26 ENCOUNTER — Ambulatory Visit (INDEPENDENT_AMBULATORY_CARE_PROVIDER_SITE_OTHER): Payer: Medicaid Other | Admitting: Podiatry

## 2019-12-26 DIAGNOSIS — M2041 Other hammer toe(s) (acquired), right foot: Secondary | ICD-10-CM

## 2019-12-26 DIAGNOSIS — M2042 Other hammer toe(s) (acquired), left foot: Secondary | ICD-10-CM | POA: Diagnosis not present

## 2019-12-26 DIAGNOSIS — E1142 Type 2 diabetes mellitus with diabetic polyneuropathy: Secondary | ICD-10-CM

## 2019-12-26 DIAGNOSIS — M2040 Other hammer toe(s) (acquired), unspecified foot: Secondary | ICD-10-CM

## 2019-12-26 MED ORDER — LIDOCAINE 4 % EX PTCH: MEDICATED_PATCH | CUTANEOUS | 2 refills | Status: DC

## 2019-12-26 MED ORDER — DICLOFENAC SODIUM 1 % EX GEL: CUTANEOUS | 2 refills | Status: DC

## 2019-12-26 MED FILL — DICLOFENAC SODIUM 1% GEL: 1 | 12 days supply | Qty: 100 | Fill #0

## 2019-12-30 NOTE — Progress Notes (Signed)
  Subjective:  Patient ID: Norma Clay, female    DOB: 12/10/1950,  MRN: [id number]  Chief Complaint  Patient presents with  . Foot Pain    Bilateral foot pain. Left is worse than right. Pain at the tips of toes/    69 y.o. female presents with the above complaint. History confirmed with patient.  Here with a translator who translates from Arabic to [place]  Objective:  Physical Exam: warm, good capillary refill, no trophic changes or ulcerative lesions and normal DP and PT pulses.  Loss of protective sensation.  Bilaterally she has semireducible hammertoe deformities.  These are painful.  Pes planus noted. Radiographs: X-ray of both feet: no fracture, dislocation, swelling or degenerative changes noted and digital contractures are present Assessment:   1. Hammer toe, unspecified laterality   2. Controlled type 2 diabetes mellitus with diabetic polyneuropathy, without long-term current use of insulin (HCC)      Plan:  Patient was evaluated and treated and all questions answered.  Discussed treatment and etiology of digital contractures.  I think this is the source of most of her pain.  We discussed nonsurgical treatment with fewer changes and topical treatment with lidocaine cream and Voltaren gel which has been helpful for her before..  I sent prescriptions for both of these.  I also recommended offloading the most painful areas with silicone toe crest and pads which I dispensed.  I think she would benefit from diabetic extra-depth shoes with accommodative insoles.  I wrote her prescription to be fitted for these at [facility] clinic.  Should these measures not be helpful we should consider flexor tenotomy to reduce the deformity as much as possible in a minimally invasive fashion.  Return if symptoms worsen or fail to improve.

## 2019-12-31 ENCOUNTER — Ambulatory Visit: Payer: Medicare Other | Admitting: Rehabilitative and Restorative Service Providers"

## 2020-01-04 DIAGNOSIS — Z419 Encounter for procedure for purposes other than remedying health state, unspecified: Secondary | ICD-10-CM | POA: Diagnosis not present

## 2020-01-07 ENCOUNTER — Other Ambulatory Visit: Payer: Self-pay

## 2020-01-07 ENCOUNTER — Ambulatory Visit: Payer: Medicare Other | Attending: Family Medicine

## 2020-01-07 DIAGNOSIS — R293 Abnormal posture: Secondary | ICD-10-CM | POA: Insufficient documentation

## 2020-01-07 DIAGNOSIS — M6281 Muscle weakness (generalized): Secondary | ICD-10-CM | POA: Diagnosis not present

## 2020-01-07 DIAGNOSIS — M25551 Pain in right hip: Secondary | ICD-10-CM | POA: Diagnosis not present

## 2020-01-07 DIAGNOSIS — G8929 Other chronic pain: Secondary | ICD-10-CM | POA: Insufficient documentation

## 2020-01-07 DIAGNOSIS — M545 Low back pain, unspecified: Secondary | ICD-10-CM | POA: Diagnosis not present

## 2020-01-07 NOTE — Therapy (Addendum)
[place] [place], [place], [address] Phone: [phone]   Fax:  [phone]  Physical Therapy Treatment/Progress Note  Progress Note Reporting Period 12/03/19 to 01/07/20  See note below for Objective Data and Assessment of Progress/Goals.      Patient Details  Name: Norma Clay MRN: [id number] Date of Birth: Sep 04, 1950 Referring Provider (PT): Eunice Blase, MD   Encounter Date: 01/07/2020   PT End of Session - 01/07/20 1651    Visit Number 10    Number of Visits 18    Date for PT Re-Evaluation 01/25/19    Authorization Type [employer] MCD    PT Start Time 1600    PT Stop Time 1633    PT Time Calculation (min) 33 min    Activity Tolerance Patient tolerated treatment well    Behavior During Therapy [facility] for tasks assessed/performed           Past Medical History:  Diagnosis Date  . Diabetes mellitus without complication ([place])   . Glaucoma   . Hypertension   . Posterior capsular opacification of both eyes, 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    Past Surgical History:  Procedure Laterality Date  . GLAUCOMA SURGERY    . THYROIDECTOMY      There were no vitals filed for this visit.   Subjective Assessment - 01/07/20 1607    Subjective Pt is doing exercises, but not the step ups because she is afraid and has nothing to hold onto.    Patient is accompained by: Interpreter    Pertinent History Falls, Hysterectomy    Limitations Standing;Walking;Lifting;House hold activities    Patient Stated Goals Reduce pain    Currently in Pain? Yes    Pain Score 2     Pain Location Back    Pain Orientation Right;Lower    Pain Descriptors / Indicators Aching    Pain Type Chronic pain    Pain Onset More than a month ago              [facility] PT Assessment - 01/07/20 0001      Standardized Balance Assessment   Standardized Balance Assessment Berg Balance Test      Berg Balance Test   Sit to Stand Able to stand   independently using hands    Standing Unsupported Able to stand 2 minutes with supervision    Sitting with Back Unsupported but Feet Supported on Floor or Stool Able to sit safely and securely 2 minutes    Stand to Sit Controls descent by using hands    Transfers Able to transfer safely, definite need of hands    Standing Unsupported with Eyes Closed Able to stand 10 seconds with supervision    Standing Unsupported with Feet Together Able to place feet together independently and stand for 1 minute with supervision    From Standing, Reach Forward with Outstretched Arm Can reach forward >5 cm safely (2")    From Standing Position, Pick up Object from [place] to pick up shoe safely and easily   uses back   From Standing Position, Turn to Look Behind Over each Shoulder Looks behind one side only/other side shows less weight shift   pain in R side, limited to L   Turn 360 Degrees Able to turn 360 degrees safely but slowly    Standing Unsupported, Alternately Place Feet on Step/Stool Able to stand independently and complete 8 steps >20 seconds    Standing Unsupported, One Foot in [employer]  to take small step independently and hold 30 seconds    Standing on One Leg Able to lift leg independently and hold equal to or more than 3 seconds    Total Score 40                         [facility] Adult PT Treatment/Exercise - 01/07/20 0001      Self-Care   Posture quad cane in L hand versus right    Other Self-Care Comments  reviewing goals      Therapeutic Activites    Therapeutic Activities Other Therapeutic Activities    Other Therapeutic Activities Berg Balance Test      Manual Therapy   Manual Therapy Soft tissue mobilization;Passive ROM    Manual therapy comments sidelying    Soft tissue mobilization STM to R QL, lumbar PS, R glutes    Passive ROM R spinal rotation with pelvic stabilization                  PT Education - 01/07/20 1658    Education Details continue to  exercise and perform program    Person(s) Educated Patient    Methods Explanation;Demonstration    Comprehension Verbalized understanding;Need further instruction            PT Short Term Goals - 01/07/20 1657      PT SHORT TERM GOAL #1   Title Pt will be I and compliant with initial HEP.    Status Achieved      PT SHORT TERM GOAL #2   Title Pt will report ability to stand for 10 minutes to help around the house.    Status Achieved             PT Long Term Goals - 01/07/20 1657      PT LONG TERM GOAL #1   Title Pt will be I and compliant with long term HEP for progression and maintenance of condition.    Baseline working on HEP advanced but only did HEP 2 x since last seen    Time 6    Period Weeks    Status On-going      PT LONG TERM GOAL #2   Title Pt will demo at least 4/5 hip MMT for improved stability of pelvis and lumbar spine.    Baseline 5 x sts 27.33 sec  showing LE weakness and balance issues (12/24/19)    Time 6    Period Weeks    Status On-going      PT LONG TERM GOAL #3   Title Pt will score 45/56 on Berg Balance test, in order to demonstrate decreased fall risk.    Baseline 40/56    Time 6    Period Weeks    Status On-going      PT LONG TERM GOAL #4   Title Pt will report decrease in pain by 50%, in order to participate in leisure activities more frequently.    Baseline pain level 2/10 today    Time 6    Period Weeks    Status On-going      PT LONG TERM GOAL #5   Title Pt will be able to stand for up to 20 minutes, in order to perform ADLs/IADLs.    Baseline pt fatigues after 6 minutes currently    Time 6    Status On-going  Plan - 01/07/20 1653    Clinical Impression Statement Pt presents with quad cane on R side, less back pain today down to 2/10, but c/o B shoulder pain as well. Pt has been semi diligent with exercises, per intepreter Bedor ElNegromi, and has not performed step ups due to fear and nowhere to hold  on. Reassessed Berg Balance test with small improvement from 38/56 last performance to 40/56 today. Pt continues to be an increased fall risk in the community, but reports feeling better with physical therapy and less pain overall.    Personal Factors and Comorbidities Age;Comorbidity 3+;Time since onset of injury/illness/exacerbation;Past/Current Experience;Fitness    Comorbidities HTN, DM, Glaucoma    Examination-Activity Limitations Lift;Squat;Bend;Caring for Others;Stand;Reach Overhead;Locomotion Level    Examination-Participation Restrictions Interpersonal Relationship;Cleaning;Laundry;Meal Prep;Community Activity    PT Frequency 2x / week    PT Duration 6 weeks    PT Treatment/Interventions ADLs/Self Care Home Management;Moist Heat;Electrical Stimulation;Cryotherapy;Joint Manipulations;Spinal Manipulations;Passive range of motion;Dry needling;Manual techniques;Patient/family education;Therapeutic exercise;Balance training;Neuromuscular re-education;Therapeutic activities;Functional mobility training;Gait training    PT Next Visit Plan Standing tolerance, balance, strength, manual therapy as needed for pain reduction and to address soft tissue/joint restrictions    PT Home Exercise Plan [id number]: H/L Clams with RTB, SKTC, Figure 4 S, S/L Thoracic Rot'n gave walking program to do and squats at sink 3 x a day step ups and lateral step ups    Consulted and Agree with Plan of Care Patient           Patient will benefit from skilled therapeutic intervention in order to improve the following deficits and impairments:  Difficulty walking,Decreased balance,Decreased activity tolerance,Decreased strength,Increased fascial restricitons,Impaired flexibility,Postural dysfunction,Pain,Improper body mechanics,Impaired perceived functional ability,Decreased range of motion,Hypomobility  Visit Diagnosis: Chronic bilateral low back pain without sciatica  Pain in right hip  Muscle weakness  (generalized)  Abnormal posture     Problem List Patient Active Problem List   Diagnosis Date Noted  . Colon cancer screening 12/16/2019  . Acquired hypothyroidism 12/16/2019  . Hyperlipidemia associated with type 2 diabetes mellitus ([place]) 12/16/2019  . Hammer toes of both feet 12/16/2019  . Thyroid nodule 12/16/2019  . Cerumen impaction 12/16/2019  . Degenerative arthritis of lumbar spine 12/16/2019  . Post-surgical hypoparathyroidism ([place]) 03/25/2019  . Essential hypertension 02/19/2019  . Postsurgical states following surgery of eye and adnexa 10/01/2018  . Pseudophakia of both eyes 04/23/2018  . 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 Hypoparathyroidism ([place]) 12/03/2017  . Osteoarthritis of right hip 05/16/2016  . Type 2 diabetes mellitus without complication, without long-term current use of insulin ([place]) 04/18/2016    Izell Dresden, PT, DPT 01/07/2020, 5:01 PM  [facility] [address] [place], [place], [address] Phone: [phone]   Fax:  [phone]  Name: Norma Clay MRN: [id number] Date of Birth: 1950-02-25  PHYSICAL THERAPY DISCHARGE SUMMARY  Visits from Start of Care: 10  Current functional level related to goals / functional outcomes: unknown   Remaining deficits: unknown   Education / Equipment: HEP, theraband  Plan: Patient agrees to discharge.  Patient goals were not met. Patient is being discharged due to not returning since the last visit.  ?????        Phill Myron. Yvette Rack, PT, DPT

## 2020-01-08 ENCOUNTER — Telehealth: Payer: Self-pay

## 2020-01-08 NOTE — Telephone Encounter (Signed)
PA for Trulicity approved until 01/06/21

## 2020-01-09 DIAGNOSIS — R413 Other amnesia: Secondary | ICD-10-CM | POA: Diagnosis not present

## 2020-01-10 ENCOUNTER — Telehealth: Payer: Self-pay

## 2020-01-10 NOTE — Telephone Encounter (Signed)
I called pt's son, Rene Paci, per [place], to discuss. No answer, left a message asking him to call us back.

## 2020-01-10 NOTE — Telephone Encounter (Signed)
Please note, you may need an interpreter or may be able to talk to her son.  I received her brain MRI report.  She had a brain MRI through [facility] on 01/09/2020, with and without contrast: Impression: No acute intracranial pathology identified.  Mild chronic small vessel ischemic change.  T1 shortening in the bilateral basal ganglia may reflect hepatic disease or other mineral deposition.  Mild cerebral atrophy.  No disproportionate atrophy seen in the medial temporal lobe hippocampi to suggest Alzheimer's disease.  Please advise patient's son that there were no acute findings on her brain MRI.  Chronic findings were seen.  One of the changes seen in the deeper structure of her brain, can be seen with patients who have problems with the liver function.  Ongoing evaluation and checkup of her kidney function, liver function, and blood sugar values is important.  For this, she will have to continue with follow-up on a regular basis with her primary care physician.  If she has not had recent labs for liver function, it would be good to get this checked at the next appointment with her primary care.  We checked a lot of other labs for her memory loss but not specific to liver function.

## 2020-01-10 NOTE — Telephone Encounter (Signed)
Received MRI results from [facility], study date was 01/09/2020. 

## 2020-01-13 ENCOUNTER — Telehealth: Payer: Self-pay | Admitting: Critical Care Medicine

## 2020-01-13 MED FILL — TRULICITY 0.75 MG/0.5 ML PE: 0.75 | 28 days supply | Qty: 2 | Fill #1

## 2020-01-13 NOTE — Telephone Encounter (Signed)
Patient son came in saying that patient is out of trulicity and when he went to the pharmacy last week he was told that the medication needed a PA. Patient son came back today and still has no answer regarding the PA. Please f/u

## 2020-01-14 ENCOUNTER — Ambulatory Visit: Payer: Medicare Other

## 2020-01-14 ENCOUNTER — Other Ambulatory Visit: Payer: Self-pay | Admitting: Pharmacist

## 2020-01-14 DIAGNOSIS — E039 Hypothyroidism, unspecified: Secondary | ICD-10-CM

## 2020-01-14 MED FILL — CALCITRIOL 0.25 MCG CAPS: 0.25 | 90 days supply | Qty: 180 | Fill #0

## 2020-01-14 MED FILL — DORZOLAMIDE-TIMOLOL EYE DRP: 22.3-6.8 | 34 days supply | Qty: 10 | Fill #5

## 2020-01-14 MED FILL — ACCU-CHEK SOFTCLIX LANCETS: 33 days supply | Qty: 100 | Fill #1

## 2020-01-14 MED FILL — metFORMIN HCL 850 MG TABS: 850 | 90 days supply | Qty: 180 | Fill #0

## 2020-01-14 MED FILL — PREDNISOLONE AC 1% EYE DROP: 1 | 34 days supply | Qty: 5 | Fill #7

## 2020-01-14 MED FILL — AMLODIPINE BESYLATE 5 MG TA: 5 | 90 days supply | Qty: 90 | Fill #0

## 2020-01-14 MED FILL — TRAVATAN Z 0.004 % SOLN: 0.004 | 56 days supply | Qty: 8 | Fill #0

## 2020-01-14 MED FILL — LEVOTHYROXINE SODIUM 100 MC: 100 | 90 days supply | Qty: 90 | Fill #0

## 2020-01-14 MED FILL — LOSARTAN POTASSIUM 25 MG TA: 25 | 90 days supply | Qty: 90 | Fill #0

## 2020-01-14 MED FILL — ATORVASTATIN CALCIUM 20 MG: 20 | 90 days supply | Qty: 90 | Fill #0

## 2020-01-14 MED FILL — ACCU-CHEK GUIDE TEST STRIP: 33 days supply | Qty: 100 | Fill #1

## 2020-01-16 ENCOUNTER — Other Ambulatory Visit: Payer: Self-pay | Admitting: *Deleted

## 2020-01-16 NOTE — Telephone Encounter (Signed)
Rx was picked up on 01/13/2020 per [facility]

## 2020-01-21 ENCOUNTER — Ambulatory Visit: Payer: Medicare Other

## 2020-01-27 NOTE — Telephone Encounter (Signed)
I called patient's son, Rene Paci, per [place].  I discussed patient's MRI results and recommendations.  Patient son asked that the MRI results will be sent to Dr. Delford Field, patient's PCP.  Patient son will keep patient's appointment as scheduled in March.  Patient is on verbalized understanding of results and recommendations.

## 2020-01-28 ENCOUNTER — Ambulatory Visit: Payer: Medicaid Other

## 2020-02-07 ENCOUNTER — Other Ambulatory Visit: Payer: Self-pay | Admitting: Surgery

## 2020-02-07 LAB — HM DIABETES EYE EXAM

## 2020-02-12 MED FILL — ACCU-CHEK GUIDE TEST STRIP: 33 days supply | Qty: 100 | Fill #2

## 2020-02-12 MED FILL — TRULICITY 0.75 MG/0.5 ML PE: 0.75 | 28 days supply | Qty: 2 | Fill #2

## 2020-02-13 MED FILL — DORZOLAMIDE-TIMOLOL EYE DRP: 22.3-6.8 | 34 days supply | Qty: 10 | Fill #6

## 2020-02-13 MED FILL — PREDNISOLONE AC 1% EYE DROP: 1 | 34 days supply | Qty: 5 | Fill #8

## 2020-02-26 ENCOUNTER — Ambulatory Visit: Payer: Medicaid Other | Attending: Critical Care Medicine | Admitting: Critical Care Medicine

## 2020-02-26 ENCOUNTER — Other Ambulatory Visit: Payer: Self-pay

## 2020-02-26 ENCOUNTER — Encounter: Payer: Self-pay | Admitting: Critical Care Medicine

## 2020-02-26 VITALS — BP 91/60 | HR 77 | Resp 16 | Wt 170.0 lb

## 2020-02-26 DIAGNOSIS — M2041 Other hammer toe(s) (acquired), right foot: Secondary | ICD-10-CM

## 2020-02-26 DIAGNOSIS — E039 Hypothyroidism, unspecified: Secondary | ICD-10-CM | POA: Diagnosis not present

## 2020-02-26 DIAGNOSIS — E892 Postprocedural hypoparathyroidism: Secondary | ICD-10-CM

## 2020-02-26 DIAGNOSIS — E119 Type 2 diabetes mellitus without complications: Secondary | ICD-10-CM

## 2020-02-26 DIAGNOSIS — Z1211 Encounter for screening for malignant neoplasm of colon: Secondary | ICD-10-CM | POA: Diagnosis not present

## 2020-02-26 DIAGNOSIS — R413 Other amnesia: Secondary | ICD-10-CM | POA: Diagnosis not present

## 2020-02-26 DIAGNOSIS — M2042 Other hammer toe(s) (acquired), left foot: Secondary | ICD-10-CM

## 2020-02-26 DIAGNOSIS — E208 Other hypoparathyroidism: Secondary | ICD-10-CM | POA: Diagnosis not present

## 2020-02-26 DIAGNOSIS — I10 Essential (primary) hypertension: Secondary | ICD-10-CM | POA: Diagnosis not present

## 2020-02-26 LAB — POCT GLYCOSYLATED HEMOGLOBIN (HGB A1C): HbA1c, POC (controlled diabetic range): 6.8 % (ref 0.0–7.0)

## 2020-02-26 LAB — GLUCOSE, POCT (MANUAL RESULT ENTRY): POC Glucose: 150 mg/dl — AB (ref 70–99)

## 2020-02-26 NOTE — Progress Notes (Signed)
States she is here to go over results from MRI but no MRI results is showing up in the system.   An encounter is in the patient record regarding the results   10:15 AM Note   Please note, you may need an interpreter or may be able to talk to her son.  I received her brain MRI report.  She had a brain MRI through [facility] on 01/09/2020, with and without contrast: Impression: No acute intracranial pathology identified.  Mild chronic small vessel ischemic change.  T1 shortening in the bilateral basal ganglia may reflect hepatic disease or other mineral deposition.  Mild cerebral atrophy.  No disproportionate atrophy seen in the medial temporal lobe hippocampi to suggest Alzheimer's disease.  Please advise patient's son that there were no acute findings on her brain MRI.  Chronic findings were seen.  One of the changes seen in the deeper structure of her brain, can be seen with patients who have problems with the liver function.  Ongoing evaluation and checkup of her kidney function, liver function, and blood sugar values is important.  For this, she will have to continue with follow-up on a regular basis with her primary care physician.  If she has not had recent labs for liver function, it would be good to get this checked at the next appointment with her primary care.  We checked a lot of other labs for her memory loss but not specific to liver function.

## 2020-02-26 NOTE — Patient Instructions (Addendum)
No change in medications for now Go to have you make an appointment with Lurena Joiner our pharmacist who you met today bring all your medications in with you so we can do a true medication reconciliation of what you are actually taking  I have a form from the foot doctor to fill out for therapeutic shoe for your foot pain , we will process that form  An appointment with neurology will be made to review your MRI results that are now back  Return to Dr. Joya Gaskins in 2 months  Note the memory doctor may recommend a sleep study          [place] 'ana tabib aldhaakirat qad yusi bidirasat alnawm              Luke                                                          la taghyir fi al'adwiat fi alwaqt alhalii YHDQOY litajealak tuhadid mwedan mae Luke alsaydalanii aladhi qabalath alyawm 'ahdir jamie al'adwiat maeak hataa natamakan min 'iijra' taswiat haqiqiat lil'adwiat mae ma tatanawaluh bialfiel ladaya astimarat min tabib alqadam limal' alhidha' aleilajii li'alam qadamik , sanaqum bimuealajat hadha alnamudhaj sayatimu tahdid maweid mae tibi al'aesab limurajaeat natayij altaswir bialranin almighnatisii alati eadat alan aleawdat 'iilaa duktur rayt baed shahrayn

## 2020-02-26 NOTE — Assessment & Plan Note (Signed)
Needs to follow through on colon cancer screening

## 2020-02-26 NOTE — Assessment & Plan Note (Signed)
Hypertension well controlled at this time no changes made 

## 2020-02-26 NOTE — Assessment & Plan Note (Signed)
Hypoparathyroidism patient has not really been compliant with her oral Calcitrol encouraged to resume

## 2020-02-26 NOTE — Assessment & Plan Note (Signed)
As per hypoparathyroidism assessment

## 2020-02-26 NOTE — Assessment & Plan Note (Signed)
Needs an orthotic will need paperwork done to apply for this has seen podiatry

## 2020-02-26 NOTE — Assessment & Plan Note (Signed)
I am unclear if she is taking her thyroid medication she is to come in for medication reconciliation visit with clinical pharmacy in the next 2 weeks

## 2020-02-26 NOTE — Progress Notes (Signed)
Subjective:    Patient ID: Norma Clay, female    DOB: Nov 17, 1950, 70 y.o.   MRN: [id number]  70 y.o. F  Here to re est pcp former [facility] pt  12/16/19 The visit is assisted by Deforest Hoyles the patient's son who preserved as interpreter Htn T2DM Hypoparathyroidism surgical s/p partial thyroidectomy in [place] years ago.  This is a former primary care patient of Dr. Chapman Fitch here to establish.  Patient's not been seen in the clinic since May of this year.  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  The patient's been referred to physical therapy and was given a short course of tramadol.  The patient states her pain in the back is still there but is better since the physical therapy.  Also there is history of parathyroidectomy and thyroidectomy in the past.  Follow-up ultrasound of blood is left of the thyroid shows a residual right lobe of the thyroid with a nodule seen present.  This was ordered by endocrinology and this will need to be followed up upon.  Note the patient does need a mammogram Pneumovax flu vaccine and A1c along with foot exam and colon cancer screening  The patient was given the flu vaccine and Pneumovax in this visit and the A1c on arrival was at 7.7 with blood glucose of 272.  The patient states through her son the interpreter that she has trouble with maintaining her Metformin dosing and often skips doses also her glimepiride is supposed to be at meals and she often skips these doses as well.  Her blood glucose often is in the 90-80 range late in the afternoon but note today she had a fairly heavy carbohydrate meal and her blood glucose is 272  Note on arrival blood pressure 108/72 and the patient is compliant with her blood pressure medications.  The patient has a myriad of complaints including pain in the right ear, scalp irritation, pain in the hip and back, wanting to know what the ultrasound of the  throat showed.  Also the patient has bilateral foot pain is seen podiatry in the past has hammertoe in the second digit of both feet and was prescribed orthotics with the patient cannot afford that now the patient has Medicaid and may be able to afford some type of special shoe.  Note she arrives with thick socks and house shoes  The son does state the patient had a Covid vaccine is actually recently had her booster shot she received the [place] vaccine series  02/26/2020 Patient is seen today in follow-up the visit is done in [place] assisted by the patient's daughter who is is with her today  The patient had since seen neurology they ordered an MRI it was done 6 January but nobody has followed the patient up with this result as of yet.  MRI is reviewed and shows small vessel disease but no changes compatible with Alzheimer's dementia  I reviewed the neurology note the patient's dementia blood work-up was normal  Patient does not drink alcohol  In reviewing the patient's medications is completely unclear to me which medicine she is taking and not taking.  She is taking both the Metformin and the Trulicity but complains of injection site pain  She is only using her thighs for the injections the daughter is helping her with this  Patient needs reeducation in this regard  Blood pressure on arrival is adequate 91/60 it is unclear  to me whether she is taking her thyroxine and wrote Calcitrol for her post surgical hypothyroidism and hypoparathyroidism  She is taking the vitamin D  Note on arrival hemoglobin A1c is 6.8 improved from prior values  Question of MRI changes compatible with liver disease note ammonia level is normal and liver functions were normal in February 2021  Past Medical History:  Diagnosis Date  . Diabetes mellitus without complication ([place])   . Glaucoma   . Hypertension   . Posterior capsular opacification of both eyes, 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     Family History   Problem Relation Age of Onset  . Diabetes Mother   . Colon cancer Neg Hx   . Stomach cancer Neg Hx   . Esophageal cancer Neg Hx      Social History   Socioeconomic History  . Marital status: Widowed    Spouse name: Not on file  . Number of children: Not on file  . Years of education: Not on file  . Highest education level: Not on file  Occupational History  . Not on file  Tobacco Use  . Smoking status: Never Smoker  . Smokeless tobacco: Never Used  Vaping Use  . Vaping Use: Never used  Substance and Sexual Activity  . Alcohol use: No  . Drug use: No  . Sexual activity: Not Currently  Other Topics Concern  . Not on file  Social History Narrative  . Not on file   Social Determinants of Health   Financial Resource Strain: Not on file  Food Insecurity: Not on file  Transportation Needs: Not on file  Physical Activity: Not on file  Stress: Not on file  Social Connections: Not on file  Intimate Partner Violence: Not on file     Allergies  Allergen Reactions  . Clindamycin/Lincomycin Rash  . Penicillins Rash    Patient was placed on a form of penicillin by her dentist and developed a rash therefore medication was changed to clindamycin.  Per son, patient is not allergic to clindamycin     Outpatient Medications Prior to Visit  Medication Sig Dispense Refill  . amLODipine (NORVASC) 5 MG tablet Take 1 tablet (5 mg total) by mouth daily. To lower blood pressure 90 tablet 3  . atorvastatin (LIPITOR) 20 MG tablet Take 1 tablet (20 mg total) by mouth daily. 30 tablet 2  . Dulaglutide (TRULICITY) 4.09 BD/5.3GD SOPN Inject 0.75 mg into the skin once a week. 2 mL 4  . levothyroxine (SYNTHROID) 100 MCG tablet TAKE 1 TABLET BY MOUTH ONCE A DAY. DO NOT EAT FOR 30 MINUTES AFTER TAKING 90 tablet 1  . Accu-Chek Softclix Lancets lancets Use as instructedUse to check TID. 100 each 2  . acetaminophen (TYLENOL) 325 MG tablet Take 1-2 tablets (325-650 mg total) by mouth every 6 (six)  hours as needed for moderate pain. 60 tablet 0  . Blood Glucose Monitoring Suppl (ACCU-CHEK GUIDE ME) w/Device KIT Use to check TID. 1 kit 0  . Blood Glucose Monitoring Suppl (ACCU-CHEK GUIDE) w/Device KIT USE TO CHECK 3 TIMES DAILY    . calcitRIOL (ROCALTROL) 0.25 MCG capsule Take 2 capsules (0.5 mcg total) by mouth daily. 180 capsule 3  . calcium carbonate (OSCAL) 1500 (600 Ca) MG TABS tablet Take 1 tablet (1,500 mg total) by mouth 3 (three) times daily with meals. 360 tablet 1  . cetirizine (ZYRTEC ALLERGY) 10 MG tablet Take 1 tablet (10 mg total) by mouth daily. 30 tablet 0  . Cholecalciferol (VITAMIN  D3) 10 MCG (400 UNIT) tablet Take 1 tablet (400 Units total) by mouth daily. 90 tablet 1  . diclofenac Sodium (VOLTAREN) 1 % GEL RUB 2 GRAMS INTO AFFECTED AREA OF FOOT 2 TO 4 TIMES DAILY 100 g 2  . dorzolamide-timolol (COSOPT) 22.3-6.8 MG/ML ophthalmic solution Place 1 drop into both eyes 2 (two) times daily. 10 mL 0  . glucose blood (ACCU-CHEK GUIDE) test strip Use to check TID. 100 each 2  . Lidocaine (HM LIDOCAINE PATCH) 4 % PTCH Apply to foot for no more than 12 hours 30 patch 2  . losartan (COZAAR) 25 MG tablet Take 1 tablet (25 mg total) by mouth daily. 90 tablet 3  . metFORMIN (GLUCOPHAGE) 850 MG tablet Take 1 tablet (850 mg total) by mouth 2 (two) times daily with a meal. 180 tablet 2  . prednisoLONE acetate (PRED FORTE) 1 % ophthalmic suspension Place 1 drop into the right eye 2 (two) times daily.    . Travoprost, BAK Free, (TRAVATAN) 0.004 % SOLN ophthalmic solution Apply 1 drop to eye at bedtime.     No facility-administered medications prior to visit.      Review of Systems  Constitutional: Negative.   HENT: Negative for congestion, dental problem, ear pain, sinus pain, sneezing, sore throat, tinnitus and trouble swallowing.   Eyes: Negative.   Respiratory: Negative.   Cardiovascular: Negative.   Gastrointestinal: Negative.   Endocrine: Negative.   Genitourinary: Negative.    Musculoskeletal: Positive for back pain and gait problem.       Back pain upper /lower, R hip pain Bilateral foot pain  Skin: Negative for rash.  Hematological: Negative.   Psychiatric/Behavioral: Negative.        Objective:   Physical Exam Vitals:   02/26/20 1121  BP: 91/60  Pulse: 77  Resp: 16  SpO2: 98%  Weight: 170 lb (77.1 kg)    Gen: Pleasant, well-nourished, in no distress,  normal affect  ENT: No lesions,  mouth clear,  oropharynx clear, no postnasal drip  Neck: No JVD, no TMG, no carotid bruits  Lungs: No use of accessory muscles, no dullness to percussion, clear without rales or rhonchi  Cardiovascular: RRR, heart sounds normal, no murmur or gallops, no peripheral edema  Abdomen: soft and NT, no HSM,  BS normal  Musculoskeletal: No deformities, no cyanosis or clubbing  Neuro: alert, non focal  Skin: Warm, dry scalp, oily hair  Foot exam: bilateral hammer toe .  Normal sensation.   No lesions  MRI 01/09/20 MRI brain without and with contrast:   INDICATION: Memory loss  IMPRESSION:   1. No acute intracranial pathology identified.   2. Mild chronic small vessel ischemic change.   3. T1 shortening in the bilateral basal ganglia may reflect hepatic disease or other mineral deposition.   4. Mild cerebral atrophy. No disproportionate atrophy seen in the medial temporal lobe hippocampi to suggest Alzheimer's disease    TECHNIQUE: Imaging of the brain was performed in 3 planes utilizing a combination of T1, FLAIR, and T2 weighting. Diffusion images were performed. In addition, sagittal and axial T1-weighted sequences were performed after intravenous injection of 5 mL Gadavist.   COMPARISON: None   FINDINGS:  # Brain parenchyma: There are mild scattered and confluent foci of high T2/FLAIR signal abnormality within the periventricular white matter most commonly seen with small vessel ischemic changes.   # T1 shortening in the bilateral basal ganglia  may reflect hepatic disease or other mineral deposition.   #  Mild cerebral atrophy. No disproportionate atrophy seen in the medial temporal lobe hippocampi to suggest Alzheimer's disease   #  Diffusion-weighted images are normal indicating no acute infarct.   #  No evidence mass or hemorrhage. No abnormal enhancement.  #   # Ventricles: Normal without dilatation, mass effect, or midline shift.   # Sella and parasellar region: Normal.  # Craniovertebral junction: Normal.   # Mastoids and paranasal sinuses: Clear.  # Orbits: Normal   # Major cerebral vessels and dural sinuses: Patent.  # Bony structures and scalp: Unremarkable.      Assessment & Plan:  I personally reviewed all images and lab data in the [facility] system as well as any outside material available during this office visit and agree with the  radiology impressions.   Essential hypertension Hypertension well controlled at this time no changes made  Acquired hypothyroidism I am unclear if she is taking her thyroid medication she is to come in for medication reconciliation visit with clinical pharmacy in the next 2 weeks  Hypoparathyroidism ([place]) Hypoparathyroidism patient has not really been compliant with her oral Calcitrol encouraged to resume  Post-surgical hypoparathyroidism ([place]) As per hypoparathyroidism assessment  Hammer toes of both feet Needs an orthotic will need paperwork done to apply for this has seen podiatry  Colon cancer screening Needs to follow through on colon cancer screening   Diagnoses and all orders for this visit:  Type 2 diabetes mellitus without complication, without long-term current use of insulin (HCC) -     HgB A1c -     Glucose (CBG)  Memory loss -     Ambulatory referral to Neurology  Essential hypertension  Acquired hypothyroidism  Other hypoparathyroidism ([place])  Post-surgical hypoparathyroidism ([place])  Hammer toes of both feet  Colon cancer  screening   Needs Covid booster Advised to return to neurology to follow-up on MRI

## 2020-03-10 ENCOUNTER — Other Ambulatory Visit: Payer: Self-pay | Admitting: Critical Care Medicine

## 2020-03-10 ENCOUNTER — Other Ambulatory Visit: Payer: Self-pay | Admitting: Family Medicine

## 2020-03-10 MED FILL — TRULICITY 0.75 MG/0.5 ML PE: 0.75 | 28 days supply | Qty: 2 | Fill #3

## 2020-03-10 MED FILL — PREDNISOLONE AC 1% EYE DROP: 1 | 34 days supply | Qty: 5 | Fill #9

## 2020-03-10 MED FILL — DORZOLAMIDE-TIMOLOL EYE DRP: 22.3-6.8 | 37 days supply | Qty: 10 | Fill #7

## 2020-03-10 MED FILL — ACCU-CHEK GUIDE TEST STRIP: 33 days supply | Qty: 100 | Fill #0

## 2020-03-10 MED FILL — TRAVATAN Z 0.004 % SOLN: 0.004 | 56 days supply | Qty: 8 | Fill #1

## 2020-03-11 ENCOUNTER — Encounter (HOSPITAL_COMMUNITY): Payer: Self-pay | Admitting: Cardiovascular Disease

## 2020-03-13 ENCOUNTER — Other Ambulatory Visit: Payer: Self-pay | Admitting: Ophthalmology

## 2020-03-13 DIAGNOSIS — E119 Type 2 diabetes mellitus without complications: Secondary | ICD-10-CM | POA: Diagnosis not present

## 2020-03-13 DIAGNOSIS — H401433 Capsular glaucoma with pseudoexfoliation of lens, bilateral, severe stage: Secondary | ICD-10-CM | POA: Diagnosis not present

## 2020-03-13 DIAGNOSIS — H209 Unspecified iridocyclitis: Secondary | ICD-10-CM | POA: Diagnosis not present

## 2020-03-13 DIAGNOSIS — H401422 Capsular glaucoma with pseudoexfoliation of lens, left eye, moderate stage: Secondary | ICD-10-CM | POA: Diagnosis not present

## 2020-03-13 MED FILL — COMBIGAN EYE DROPS: 0.2-0.5 | 37 days supply | Qty: 5 | Fill #0

## 2020-03-15 ENCOUNTER — Other Ambulatory Visit: Payer: Self-pay | Admitting: Ophthalmology

## 2020-03-16 ENCOUNTER — Ambulatory Visit: Payer: Medicaid Other | Admitting: Pharmacist

## 2020-03-19 ENCOUNTER — Encounter: Payer: Self-pay | Admitting: Neurology

## 2020-03-19 ENCOUNTER — Ambulatory Visit: Payer: Medicaid Other | Admitting: Neurology

## 2020-03-25 MED FILL — LEVOTHYROXINE SODIUM 100 MC: 100 | 90 days supply | Qty: 90 | Fill #1

## 2020-03-26 ENCOUNTER — Telehealth: Payer: Self-pay | Admitting: Physical Medicine and Rehabilitation

## 2020-03-26 NOTE — Telephone Encounter (Signed)
Pt called stating she needs an injection in her hip as she isn't able to walk.   [phone]

## 2020-03-26 NOTE — Telephone Encounter (Signed)
Scheduled with Dr. Xu. 

## 2020-03-26 NOTE — Telephone Encounter (Signed)
Right hip injection 07/02/19. Ok to repeat if helped, same problem/side, and no new injury?

## 2020-03-26 NOTE — Telephone Encounter (Signed)
See Dr. Prince Rome notes in August 2021, states injection in June did not help. Need Glee Arvin, f/up

## 2020-03-31 NOTE — Progress Notes (Signed)
    S:     PCP: Dr. Delford Field  Patient arrives in good spirits accompanied with her daughter as interpreter. Presents for diabetes evaluation, education, and management. Patient was referred and last seen by Primary Care Provider on 02/26/20. At that visit, pt reported compliance with metformin and Trulicity, but complained of injection site pain with Trulicity. Pt referred to pharmacy clinic for Trulicity administration re-education.  Today, patient reports medication adherence with metformin twice daily and Trulicity weekly on Fridays. Reports having two pens left of Trulicity 0.75 mg. Reports Trulicity injection causes pain around injection site (thigh) and does not want to inject in lower abdomen. Patient requests an oral option as she does not want to continue Trulicity injections. Reports FBG 90-lows 100s and PPBG <150. Denies BG <70.  Family/Social History:  -Fhx: diabetes in mother -Tobacco use: denies   Insurance coverage/medication affordability:  Medicaid [employer]  Medication adherence reported.   Current diabetes medications include: Trulicity 0.75 mg weekly, metformin 850 mg BID Current hypertension medications include: amlodipine 5 mg daily, losartan 25 mg daily Current hyperlipidemia medications include: atorvastatin 20 mg daily  Patient denies hypoglycemic events.  O:  Home fasting blood sugars: 90 to low 100s 2 hour post-meal/random blood sugars: <150  Lab Results  Component Value Date   HGBA1C 6.8 02/26/2020   There were no vitals filed for this visit.  Lipid Panel     Component Value Date/Time   CHOL 138 04/17/2019 0948   TRIG 50 04/17/2019 0948   HDL 68 04/17/2019 0948   CHOLHDL 2.0 04/17/2019 0948   LDLCALC 59 04/17/2019 0948   Clinical Atherosclerotic Cardiovascular Disease (ASCVD): No  The 10-year ASCVD risk score Denman George DC Jr., et al., 2013) is: 12.1%   Values used to calculate the score:     Age: 70 years     Sex: Female     Is Non-Hispanic African  American: No     Diabetic: Yes     Tobacco smoker: No     Systolic Blood Pressure: 91 mmHg     Is BP treated: Yes     HDL Cholesterol: 68 mg/dL     Total Cholesterol: 138 mg/dL    A/P: Diabetes longstanding currently controlled. Medication adherence appears optimal. Patient experiencing injection site pain and does not want to continue Trulicity - pt prefers an oral option. Will start an SGLT-2 inhibitor and check BMP at next visit.    -Discontinued Trulicity (dulaglutide) -Started Jardiance (empaglflozin) 10 mg daily -Continued metformin 850 mg BID -F/u labs ordered - BMET at next visit -Extensively discussed pathophysiology of diabetes, recommended lifestyle interventions, dietary effects on blood sugar control -Counseled on s/sx of and management of hypoglycemia -Next A1C anticipated May 2022.   ASCVD risk - primary prevention in patient with diabetes. Last LDL is controlled. ASCVD risk score is not >20%  - moderate intensity statin indicated.  -Continued atorvastatin 20 mg daily  Written patient instructions provided. Total time in face to face counseling 25 minutes.   Follow up Pharmacist Clinic Visit in 4 weeks.     Fabio Neighbors, PharmD, BCPS PGY2 Ambulatory Care Resident [facility]  Pharmacy

## 2020-04-01 ENCOUNTER — Ambulatory Visit: Payer: Medicaid Other | Attending: Critical Care Medicine | Admitting: Pharmacist

## 2020-04-01 ENCOUNTER — Other Ambulatory Visit: Payer: Self-pay

## 2020-04-01 ENCOUNTER — Other Ambulatory Visit: Payer: Self-pay | Admitting: Critical Care Medicine

## 2020-04-01 DIAGNOSIS — E119 Type 2 diabetes mellitus without complications: Secondary | ICD-10-CM

## 2020-04-01 MED ORDER — EMPAGLIFLOZIN 10 MG PO TABS: 10.0000 mg | ORAL_TABLET | Freq: Every day | ORAL | 11 refills | Status: DC

## 2020-04-01 MED FILL — JARDIANCE 10 MG TABLET: 10 | 30 days supply | Qty: 30 | Fill #0

## 2020-04-02 ENCOUNTER — Ambulatory Visit (INDEPENDENT_AMBULATORY_CARE_PROVIDER_SITE_OTHER): Payer: Medicare Other

## 2020-04-02 ENCOUNTER — Ambulatory Visit (INDEPENDENT_AMBULATORY_CARE_PROVIDER_SITE_OTHER): Payer: Medicare Other | Admitting: Orthopaedic Surgery

## 2020-04-02 ENCOUNTER — Encounter: Payer: Self-pay | Admitting: Orthopaedic Surgery

## 2020-04-02 DIAGNOSIS — M1611 Unilateral primary osteoarthritis, right hip: Secondary | ICD-10-CM

## 2020-04-02 NOTE — Progress Notes (Signed)
Office Visit Note   Patient: Norma Clay           Date of Birth: 1950-05-20           MRN: [id number] Visit Date: 04/02/2020              Requested by: Storm Frisk, MD [address] [place],  [place] [address] PCP: Storm Frisk, MD   Assessment & Plan: Visit Diagnoses:  1. Primary osteoarthritis of right hip     Plan: Impression is end-stage right hip degenerative joint disease.  Through a language interpreter I was able to explain to her the various treatment options that included nonsurgical versus surgical and their likelihood of providing sustained relief.  I explained that her medical comorbidities are not contraindications to having surgery but that we would certainly make sure that they were optimized before performing surgery to mitigate perioperative risk.  She will take some time to think about her options and have a discussion with her family members.  She will follow-up with either Dr. Prince Rome or Dr. Alvester Morin periodically for cortisone injections if she chooses to have additional ones.  She will follow up with me when she is ready for surgery.  Total face to face encounter time was greater than 25 minutes and over half of this time was spent in counseling and/or coordination of care.  Follow-Up Instructions: Return if symptoms worsen or fail to improve.   Orders:  Orders Placed This Encounter  Procedures  . XR HIP UNILAT W OR W/O PELVIS 2-3 VIEWS RIGHT  . Ambulatory referral to Physical Medicine Rehab   No orders of the defined types were placed in this encounter.     Procedures: No procedures performed   Clinical Data: No additional findings.   Subjective: Chief Complaint  Patient presents with  . Right Hip - Pain    Patient is a 70 year old Arabic female who comes in today for evaluation of chronic right hip pain.  I saw her about 4 years ago for the same issue and she underwent a cortisone injection at that time.  Since then she has had a more  recent injection by Dr. Alvester Morin in June 2021 which helped for a few months.  She states that she has chronic severe pain and she now has to use a cane for ambulation.  She is unable to do most ADLs due to the severe pain.  Today's encounter was performed through an interpreter.   Review of Systems  Constitutional: Negative.   HENT: Negative.   Eyes: Negative.   Respiratory: Negative.   Cardiovascular: Negative.   Endocrine: Negative.   Musculoskeletal: Negative.   Neurological: Negative.   Hematological: Negative.   Psychiatric/Behavioral: Negative.   All other systems reviewed and are negative.    Objective: Vital Signs: There were no vitals taken for this visit.  Physical Exam Vitals and nursing note reviewed.  Constitutional:      Appearance: She is well-developed.  HENT:     Head: Normocephalic and atraumatic.  Pulmonary:     Effort: Pulmonary effort is normal.  Abdominal:     Palpations: Abdomen is soft.  Musculoskeletal:     Cervical back: Neck supple.  Skin:    General: Skin is warm.     Capillary Refill: Capillary refill takes less than 2 seconds.  Neurological:     Mental Status: She is alert and oriented to person, place, and time.  Psychiatric:  Behavior: Behavior normal.        Thought Content: Thought content normal.        Judgment: Judgment normal.     Ortho Exam Right hip shows severe pain with internal and external rotation.  Trendelenburg gait with a cane.  No sciatic tension signs.  Hip is nontender.  Range of Motion is severely limited. Specialty Comments: No specialty comments available.  Imaging: XR HIP UNILAT W OR W/O PELVIS 2-3 VIEWS RIGHT  Result Date: 04/02/2020 Severe right hip degenerative joint disease with progression compared to prior x-ray.    PMFS History: Patient Active Problem List   Diagnosis Date Noted  . Colon cancer screening 12/16/2019  . Acquired hypothyroidism 12/16/2019  . Hyperlipidemia associated with type  2 diabetes mellitus (HCC) 12/16/2019  . Hammer toes of both feet 12/16/2019  . Degenerative arthritis of lumbar spine 12/16/2019  . 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  . Essential hypertension 02/19/2019  . Postsurgical states following surgery of eye and adnexa 10/01/2018  . Pseudophakia of both eyes 04/23/2018  . 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 Osteoarthritis of right hip 05/16/2016  . Type 2 diabetes mellitus without complication, without long-term current use of insulin (HCC) 04/18/2016   Past Medical History:  Diagnosis Date  . Diabetes mellitus without complication (HCC)   . Glaucoma   . Hypertension   . Posterior capsular opacification of both eyes, 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    Family History  Problem Relation Age of Onset  . Diabetes Mother   . Colon cancer Neg Hx   . Stomach cancer Neg Hx   . Esophageal cancer Neg Hx     Past Surgical History:  Procedure Laterality Date  . GLAUCOMA SURGERY    . THYROIDECTOMY     Social History   Occupational History  . Not on file  Tobacco Use  . Smoking status: Never Smoker  . Smokeless tobacco: Never Used  Vaping Use  . Vaping Use: Never used  Substance and Sexual Activity  . Alcohol use: No  . Drug use: No  . Sexual activity: Not Currently

## 2020-04-06 ENCOUNTER — Other Ambulatory Visit: Payer: Self-pay | Admitting: Physician Assistant

## 2020-04-06 ENCOUNTER — Telehealth: Payer: Self-pay | Admitting: Orthopaedic Surgery

## 2020-04-06 MED ORDER — HYDROCODONE-ACETAMINOPHEN 5-325 MG PO TABS
1.0000 | ORAL_TABLET | Freq: Two times a day (BID) | ORAL | 0 refills | Status: DC | PRN
Start: 2020-04-06 — End: 2020-04-16

## 2020-04-06 NOTE — Telephone Encounter (Signed)
I sent in norco to [facility] [address]

## 2020-04-06 NOTE — Telephone Encounter (Signed)
Patient's daughter Trey Sailors called advised her mother is in so much pain that she can not wait until the appointment on the 14th of April. Armani asked if her mother can get something for the pain until the appointment. Armani said it is hard for her mother to sit down and try to stand up. The number to contact Armani is [phone]

## 2020-04-07 NOTE — Telephone Encounter (Signed)
Patient daughter aware Rx called into pharmacy

## 2020-04-08 ENCOUNTER — Other Ambulatory Visit: Payer: Self-pay | Admitting: Urgent Care

## 2020-04-08 ENCOUNTER — Other Ambulatory Visit: Payer: Self-pay | Admitting: Critical Care Medicine

## 2020-04-08 ENCOUNTER — Other Ambulatory Visit: Payer: Self-pay

## 2020-04-08 DIAGNOSIS — E039 Hypothyroidism, unspecified: Secondary | ICD-10-CM

## 2020-04-08 DIAGNOSIS — E785 Hyperlipidemia, unspecified: Secondary | ICD-10-CM

## 2020-04-08 DIAGNOSIS — E1169 Type 2 diabetes mellitus with other specified complication: Secondary | ICD-10-CM

## 2020-04-08 MED FILL — Dorzolamide HCl-Timolol Maleate Ophth Soln 2-0.5%: OPHTHALMIC | 100 days supply | Qty: 10 | Fill #0 | Status: CN

## 2020-04-08 MED FILL — Losartan Potassium Tab 25 MG: ORAL | 90 days supply | Qty: 90 | Fill #0 | Status: AC

## 2020-04-08 MED FILL — Dorzolamide HCl-Timolol Maleate Ophth Soln 2-0.5%: OPHTHALMIC | 75 days supply | Qty: 10 | Fill #0 | Status: AC

## 2020-04-08 MED FILL — Calcitriol Cap 0.25 MCG: ORAL | 90 days supply | Qty: 180 | Fill #0 | Status: AC

## 2020-04-08 MED FILL — Brimonidine Tartrate Ophth Soln 0.2%: OPHTHALMIC | 37 days supply | Qty: 5 | Fill #0 | Status: CN

## 2020-04-08 MED FILL — Prednisolone Acetate Ophth Susp 1%: OPHTHALMIC | 38 days supply | Qty: 5 | Fill #0 | Status: CN

## 2020-04-08 MED FILL — Brimonidine Tartrate-Timolol Maleate Ophth Soln 0.2-0.5%: OPHTHALMIC | 37 days supply | Qty: 5 | Fill #0 | Status: CN

## 2020-04-08 MED FILL — Dorzolamide HCl-Timolol Maleate Ophth Soln 2-0.5%: OPHTHALMIC | 37 days supply | Qty: 5 | Fill #0 | Status: CN

## 2020-04-08 MED FILL — Glucose Blood Test Strip: 33 days supply | Qty: 100 | Fill #0 | Status: AC

## 2020-04-08 MED FILL — Metformin HCl Tab 850 MG: ORAL | 90 days supply | Qty: 180 | Fill #0 | Status: AC

## 2020-04-08 MED FILL — Lancets: 33 days supply | Qty: 100 | Fill #0 | Status: AC

## 2020-04-08 MED FILL — Empagliflozin Tab 10 MG: ORAL | Qty: 30 | Fill #0 | Status: CN

## 2020-04-08 MED FILL — Amlodipine Besylate Tab 5 MG (Base Equivalent): ORAL | 90 days supply | Qty: 90 | Fill #0 | Status: AC

## 2020-04-08 MED FILL — Travoprost Ophth Soln 0.004% (Benzalkonium Free) (BAK Free): OPHTHALMIC | 56 days supply | Qty: 7.5 | Fill #0 | Status: CN

## 2020-04-08 MED FILL — Prednisolone Acetate Ophth Susp 1%: OPHTHALMIC | Qty: 15 | Fill #0 | Status: CN

## 2020-04-08 NOTE — Telephone Encounter (Signed)
Future visit scheduled: yes  Notes to clinic: Patient has upcoming appointment on 04/13/2020 Synthroid is not due yet was just filled Review for refills    Requested Prescriptions  Pending Prescriptions Disp Refills   levothyroxine (SYNTHROID) 100 MCG tablet 90 tablet 1    Sig: TAKE 1 TABLET BY MOUTH ONCE A DAY. DO NOT EAT FOR 30 MINUTES AFTER TAKING      Endocrinology:  Hypothyroid Agents Failed - 04/08/2020 11:23 AM      Failed - TSH needs to be rechecked within 3 months after an abnormal result. Refill until TSH is due.      Passed - TSH in normal range and within 360 days    TSH  Date Value Ref Range Status  12/17/2019 0.566 0.450 - 4.500 uIU/mL Final          Passed - Valid encounter within last 12 months    Recent Outpatient Visits           1 week ago Type 2 diabetes mellitus without complication, without long-term current use of insulin [facility])   [place] [facility] [place], Jeannett Senior L, RPH-CPP   1 month ago Type 2 diabetes mellitus without complication, without long-term current use of insulin (HCC)   [place] [facility] Storm Frisk, MD   3 months ago Type 2 diabetes mellitus without complication, without long-term current use of insulin [facility])   [place] [place], Jeannett Senior L, RPH-CPP   3 months ago Type 2 diabetes mellitus without complication, without long-term current use of insulin (HCC)   [place] [facility] Storm Frisk, MD   11 months ago Allergic rhinitis, unspecified seasonality, unspecified trigger   [place] [facility] Cain Saupe, MD       Future Appointments             In 5 days Storm Frisk, MD [facility]   In 3 weeks Lois Huxley, Cornelius Moras, RPH-CPP [place] [facility]               atorvastatin (LIPITOR) 20 MG tablet 30 tablet 2    Sig: TAKE 1  TABLET (20 MG TOTAL) BY MOUTH DAILY.      Cardiovascular:  Antilipid - Statins Failed - 04/08/2020 11:23 AM      Failed - LDL in normal range and within 360 days    LDL Chol Calc (NIH)  Date Value Ref Range Status  04/17/2019 59 0 - 99 mg/dL Final          Passed - Total Cholesterol in normal range and within 360 days    Cholesterol, Total  Date Value Ref Range Status  04/17/2019 138 100 - 199 mg/dL Final          Passed - HDL in normal range and within 360 days    HDL  Date Value Ref Range Status  04/17/2019 68 >39 mg/dL Final          Passed - Triglycerides in normal range and within 360 days    Triglycerides  Date Value Ref Range Status  04/17/2019 50 0 - 149 mg/dL Final          Passed - Patient is not pregnant      Passed - Valid encounter within last 12 months    Recent Outpatient Visits  1 week ago Type 2 diabetes mellitus without complication, without long-term current use of insulin [facility])   [place] [facility] [place], Jeannett Senior L, RPH-CPP   1 month ago Type 2 diabetes mellitus without complication, without long-term current use of insulin [facility])   [place] [facility] Storm Frisk, MD   3 months ago Type 2 diabetes mellitus without complication, without long-term current use of insulin [facility])   [place] [facility] [place], Cornelius Moras, RPH-CPP   3 months ago Type 2 diabetes mellitus without complication, without long-term current use of insulin [facility])   [place] [facility] Storm Frisk, MD   11 months ago Allergic rhinitis, unspecified seasonality, unspecified trigger   [place] [facility] Cain Saupe, MD       Future Appointments             In 5 days Storm Frisk, MD [facility] Wellness   In 3 weeks Lois Huxley, Cornelius Moras, RPH-CPP [facility]

## 2020-04-09 ENCOUNTER — Other Ambulatory Visit: Payer: Self-pay

## 2020-04-09 MED ORDER — ATORVASTATIN CALCIUM 20 MG PO TABS
20.0000 mg | ORAL_TABLET | Freq: Every day | ORAL | 2 refills | Status: DC
  Filled 2020-04-09 – 2020-04-10 (×2): qty 30, 30d supply, fill #0
  Filled 2020-05-25: qty 30, 30d supply, fill #1
  Filled 2020-06-24: qty 30, 30d supply, fill #2

## 2020-04-09 MED ORDER — LEVOTHYROXINE SODIUM 100 MCG PO TABS
100.0000 ug | ORAL_TABLET | Freq: Every day | ORAL | 2 refills | Status: DC
  Filled 2020-04-09 – 2020-05-25 (×3): qty 30, 30d supply, fill #0

## 2020-04-10 ENCOUNTER — Other Ambulatory Visit: Payer: Self-pay

## 2020-04-12 NOTE — Progress Notes (Signed)
Subjective:    Patient ID: Norma Clay, female    DOB: Nov 17, 1950, 70 y.o.   MRN: [id number]  70 y.o. F  Here to re est pcp former [facility] pt  12/16/19 The visit is assisted by Deforest Hoyles the patient's son who preserved as interpreter Htn T2DM Hypoparathyroidism surgical s/p partial thyroidectomy in [place] years ago.  This is a former primary care patient of Dr. Chapman Fitch here to establish.  Patient's not been seen in the clinic since May of this year.  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  The patient's been referred to physical therapy and was given a short course of tramadol.  The patient states her pain in the back is still there but is better since the physical therapy.  Also there is history of parathyroidectomy and thyroidectomy in the past.  Follow-up ultrasound of blood is left of the thyroid shows a residual right lobe of the thyroid with a nodule seen present.  This was ordered by endocrinology and this will need to be followed up upon.  Note the patient does need a mammogram Pneumovax flu vaccine and A1c along with foot exam and colon cancer screening  The patient was given the flu vaccine and Pneumovax in this visit and the A1c on arrival was at 7.7 with blood glucose of 272.  The patient states through her son the interpreter that she has trouble with maintaining her Metformin dosing and often skips doses also her glimepiride is supposed to be at meals and she often skips these doses as well.  Her blood glucose often is in the 90-80 range late in the afternoon but note today she had a fairly heavy carbohydrate meal and her blood glucose is 272  Note on arrival blood pressure 108/72 and the patient is compliant with her blood pressure medications.  The patient has a myriad of complaints including pain in the right ear, scalp irritation, pain in the hip and back, wanting to know what the ultrasound of the  throat showed.  Also the patient has bilateral foot pain is seen podiatry in the past has hammertoe in the second digit of both feet and was prescribed orthotics with the patient cannot afford that now the patient has Medicaid and may be able to afford some type of special shoe.  Note she arrives with thick socks and house shoes  The son does state the patient had a Covid vaccine is actually recently had her booster shot she received the [place] vaccine series  02/26/2020 Patient is seen today in follow-up the visit is done in [place] assisted by the patient's daughter who is is with her today  The patient had since seen neurology they ordered an MRI it was done 6 January but nobody has followed the patient up with this result as of yet.  MRI is reviewed and shows small vessel disease but no changes compatible with Alzheimer's dementia  I reviewed the neurology note the patient's dementia blood work-up was normal  Patient does not drink alcohol  In reviewing the patient's medications is completely unclear to me which medicine she is taking and not taking.  She is taking both the Metformin and the Trulicity but complains of injection site pain  She is only using her thighs for the injections the daughter is helping her with this  Patient needs reeducation in this regard  Blood pressure on arrival is adequate 91/60 it is unclear  to me whether she is taking her thyroxine and wrote Calcitrol for her post surgical hypothyroidism and hypoparathyroidism  She is taking the vitamin D  Note on arrival hemoglobin A1c is 6.8 improved from prior values  Question of MRI changes compatible with liver disease note ammonia level is normal and liver functions were normal in February 2021  71/11/22 Today the patient is accompanied by her son who provides Arabic interpretation he actually works as an Astronomer himself.  His name is Deforest Hoyles                           This patient was seen by her clinical  pharmacist on on March 30 documentation is as below [facility] [employer] D 3/30: Diabetes longstanding currently controlled. Medication adherence appears optimal. Patient experiencing injection site pain and does not want to continue Trulicity - pt prefers an oral option. Will start an SGLT-2 inhibitor and check BMP at next visit. -Discontinued Trulicity (dulaglutide) -Started Jardiance (empaglflozin) 10 mg daily -Continued metformin 850 mg BID -F/u labs ordered - BMET at next visit -Extensively discussed pathophysiology of diabetes, recommended lifestyle interventions, dietary effects on blood sugar control -Counseled on s/sx of and management of hypoglycemia -Next A1C anticipated May 2022.   ASCVD risk - primary prevention in patient with diabetes. Last LDL is controlled. ASCVD risk score is not >20%  - moderate intensity statin indicated.  -Continued atorvastatin 20 mg daily  Patient saw orthopedics March 31 documentation is as below Saw Ortho 04/02/20: Primary osteoarthritis of right hip    Plan: Impression is end-stage right hip degenerative joint disease.  Through a language interpreter I was able to explain to her the various treatment options that included nonsurgical versus surgical and their likelihood of providing sustained relief.  I explained that her medical comorbidities are not contraindications to having surgery but that we would certainly make sure that they were optimized before performing surgery to mitigate perioperative risk.  She will take some time to think about her options and have a discussion with her family members.  She will follow-up with either Dr. Junius Roads or Dr. Ernestina Patches periodically for cortisone injections if she chooses to have additional ones.  She will follow up with me when she is ready for surgery.  Patient is having difficulty ambulating now with a cane has increased pain in the right hip but is not wishing to have a hip replacement.  She has been referred to  pain management for corticosteroid injection of the right hip.  Patient also has been seen by neurology previously they have not made a complete diagnosis on her memory loss her brain MRI was unremarkable she had mild changes on her mental status exam questions.  Patient is also been to see podiatry they recommend orthotic shoes for her diabetic neuropathy and chronic pain in the feet.  The patient was sent to an [place] and they are wishing documentation from this clinic regarding this patient's diabetes.  This patient does have type 2 diabetes ICD 10 code is as noted on the documentation of this chart.  Patient had elevated glucose levels and A1c however today on arrival A1c is now to 6.6 blood glucoses are improved on Metformin and Jardiance.  Patient is under comprehensive pain plan of care for her diabetes and see assessment for the plan of care  Patient does meet necessity for diabetic shoes see assessment and plan of care    The podiatrist documentation  is included with this.  I agree with the plan of care of the podiatrist.  The patient has severe peripheral neuropathy and evidence of callus formation in the feet.   The patient is yet to achieve the mammogram and is not turned on her fecal occult study.  The patient is compliant with her thyroid medicine and thyroid function has been normal on current dose.  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 Diabetes mellitus without complication ([place])   . Glaucoma   . Hypertension   . Posterior capsular opacification of both eyes, 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     Family History  Problem Relation Age of Onset  . Diabetes Mother   . Colon cancer Neg Hx   . Stomach cancer Neg Hx   . Esophageal cancer Neg Hx      Social History   Socioeconomic History  . Marital status: Widowed    Spouse  name: Not on file  . Number of children: Not on file  . Years of education: Not on file  . Highest education level: Not on file  Occupational History  . Not on file  Tobacco Use  . Smoking status: Never Smoker  . Smokeless tobacco: Never Used  Vaping Use  . Vaping Use: Never used  Substance and Sexual Activity  . Alcohol use: No  . Drug use: No  . Sexual activity: Not Currently  Other Topics Concern  . Not on file  Social History Narrative  . Not on file   Social Determinants of Health   Financial Resource Strain: Not on file  Food Insecurity: Not on file  Transportation Needs: Not on file  Physical Activity: Not on file  Stress: Not on file  Social Connections: Not on file  Intimate Partner Violence: Not on file     Allergies  Allergen Reactions  . Clindamycin/Lincomycin Rash  . Penicillins Rash    Patient was placed on a form of penicillin by her dentist and developed a rash therefore medication was changed to clindamycin.  Per son, patient is not allergic to clindamycin     Outpatient Medications Prior to Visit  Medication Sig Dispense Refill  . Accu-Chek Softclix Lancets lancets USE AS INSTRUCTED USE TO CHECK 3 TIMES DAILY (Patient taking differently: USE AS INSTRUCTED USE TO CHECK 3 TIMES DAILY) 100 each 2  . acetaminophen (TYLENOL) 325 MG tablet Take 1-2 tablets (325-650 mg total) by mouth every 6 (six) hours as needed for moderate pain. 60 tablet 0  . amLODipine (NORVASC) 5 MG tablet TAKE 1 TABLET (5 MG TOTAL) BY MOUTH DAILY. TO LOWER BLOOD PRESSURE 90 tablet 3  . atorvastatin (LIPITOR) 20 MG tablet Take 1 tablet (20 mg total) by mouth daily. 30 tablet 2  . Blood Glucose Monitoring Suppl (ACCU-CHEK GUIDE ME) w/Device KIT Use to check TID. 1 kit 0  . brimonidine (ALPHAGAN) 0.2 % ophthalmic solution PLACE 1 DROP INTO THE RIGHT EYE 2 (TWO) TIMES DAILY 5 mL 12  . brimonidine-timolol (COMBIGAN) 0.2-0.5 % ophthalmic solution PLACE 1 DROP INTO THE RIGHT EYE 2 (TWO) TIMES  DAILY 5 mL 11  . calcitRIOL (ROCALTROL) 0.25 MCG capsule TAKE 2 CAPSULES (0.5 MCG TOTAL) BY MOUTH DAILY. 180 capsule 3  . calcium carbonate (OSCAL) 1500 (600 Ca) MG TABS tablet Take 1 tablet (1,500 mg total) by mouth 3 (three) times  daily with meals. 360 tablet 1  . Cholecalciferol (VITAMIN D3) 10 MCG (400 UNIT) tablet Take 1 tablet (400 Units total) by mouth daily. 90 tablet 1  . diclofenac (VOLTAREN) 75 MG EC tablet Take 75 mg by mouth 2 (two) times daily as needed.    . dorzolamide-timolol (COSOPT) 22.3-6.8 MG/ML ophthalmic solution PLACE 1 DROP INTO THE LEFT EYE 2 (TWO) TIMES DAILY 40 mL 3  . empagliflozin (JARDIANCE) 10 MG TABS tablet TAKE 1 TABLET (10 MG TOTAL) BY MOUTH DAILY BEFORE BREAKFAST. 30 tablet 11  . glucose blood test strip USE TO CHECK 3 TIMES DAILY (Patient taking differently: USE TO CHECK 3 TIMES DAILY) 100 strip 2  . levothyroxine (SYNTHROID) 100 MCG tablet Take 1 tablet (100 mcg total) by mouth daily before breakfast. 30 tablet 2  . Lidocaine (HM LIDOCAINE PATCH) 4 % PTCH Apply to foot for no more than 12 hours 30 patch 2  . losartan (COZAAR) 25 MG tablet TAKE 1 TABLET (25 MG TOTAL) BY MOUTH DAILY. 90 tablet 3  . metFORMIN (GLUCOPHAGE) 850 MG tablet TAKE 1 TABLET (850 MG TOTAL) BY MOUTH 2 (TWO) TIMES DAILY WITH A MEAL. 180 tablet 2  . prednisoLONE acetate (PRED FORTE) 1 % ophthalmic suspension INSTILL 1 DROP RIGHT EYE TWICE A DAY 15 mL 4  . Travoprost, BAK Free, (TRAVATAN) 0.004 % SOLN ophthalmic solution INSTILL 1 DROP BOTH EYES EVERY EVENING 7.5 mL 2  . cetirizine (ZYRTEC) 10 MG tablet TAKE 1 TABLET (10 MG TOTAL) BY MOUTH DAILY. (Patient not taking: Reported on 04/13/2020) 30 tablet 0  . diclofenac Sodium (VOLTAREN) 1 % GEL RUB 2 GRAMS INTO AFFECTED AREA OF FOOT 2 TO 4 TIMES DAILY (Patient not taking: Reported on 04/13/2020) 100 g 2  . HYDROcodone-acetaminophen (NORCO) 5-325 MG tablet Take 1 tablet by mouth 2 (two) times daily as needed. (Patient not taking: Reported on  04/13/2020) 20 tablet 0  . Blood Glucose Monitoring Suppl (ACCU-CHEK GUIDE) w/Device KIT USE TO CHECK 3 TIMES DAILY    . Blood Glucose Monitoring Suppl (ACCU-CHEK GUIDE) w/Device KIT USE TO CHECK 3 TIMES DAILY 1 kit 0  . dorzolamide-timolol (COSOPT) 22.3-6.8 MG/ML ophthalmic solution Place 1 drop into both eyes 2 (two) times daily. 10 mL 0  . dorzolamide-timolol (COSOPT) 22.3-6.8 MG/ML ophthalmic solution INSTILL 1 DROP INTO BOTH EYES TWICE A DAY 10 mL 3  . dorzolamide-timolol (COSOPT) 22.3-6.8 MG/ML ophthalmic solution INSTILL 1 DROP BOTH EYES TWICE A DAY 30 mL 4  . prednisoLONE acetate (PRED FORTE) 1 % ophthalmic suspension Place 1 drop into the right eye 2 (two) times daily.    . prednisoLONE acetate (PRED FORTE) 1 % ophthalmic suspension INSTILL 1 DROP RIGHT EYE TWICE A DAY 5 mL 4  . Travoprost, BAK Free, (TRAVATAN) 0.004 % SOLN ophthalmic solution Apply 1 drop to eye at bedtime.     No facility-administered medications prior to visit.      Review of Systems  Constitutional: Negative.   HENT: Negative for congestion, dental problem, ear pain, sinus pain, sneezing, sore throat, tinnitus and trouble swallowing.   Eyes: Negative.   Respiratory: Negative.   Cardiovascular: Negative.   Gastrointestinal: Negative.   Endocrine: Negative.   Genitourinary: Negative.   Musculoskeletal: Positive for back pain and gait problem.       Back pain upper /lower, R hip pain Bilateral foot pain  Skin: Negative for rash.  Hematological: Negative.   Psychiatric/Behavioral: Negative.        Objective:   Physical  Exam Vitals:   04/13/20 1616  BP: 105/69  Pulse: 74  SpO2: 99%  Weight: 151 lb (68.5 kg)  Height: _0  (1.626 m)    Gen: Pleasant, well-nourished, in no distress,  normal affect  ENT: No lesions,  mouth clear,  oropharynx clear, no postnasal drip  Neck: No JVD, no TMG, no carotid bruits  Lungs: No use of accessory muscles, no dullness to percussion, clear without rales or  rhonchi  Cardiovascular: RRR, heart sounds normal, no murmur or gallops, no peripheral edema  Abdomen: soft and NT, no HSM,  BS normal  Musculoskeletal: No deformities, no cyanosis or clubbing  Neuro: alert, non focal  Skin: Warm, dry scalp, oily hair  Foot exam: bilateral hammer toe .  Callus formation seen at the base the feet Decreased sensation   MRI 01/09/20 MRI brain without and with contrast:   INDICATION: Memory loss  IMPRESSION:   1. No acute intracranial pathology identified.   2. Mild chronic small vessel ischemic change.   3. T1 shortening in the bilateral basal ganglia may reflect hepatic disease or other mineral deposition.   4. Mild cerebral atrophy. No disproportionate atrophy seen in the medial temporal lobe hippocampi to suggest Alzheimer's disease    TECHNIQUE: Imaging of the brain was performed in 3 planes utilizing a combination of T1, FLAIR, and T2 weighting. Diffusion images were performed. In addition, sagittal and axial T1-weighted sequences were performed after intravenous injection of 5 mL Gadavist.   COMPARISON: None   FINDINGS:  # Brain parenchyma: There are mild scattered and confluent foci of high T2/FLAIR signal abnormality within the periventricular white matter most commonly seen with small vessel ischemic changes.   # T1 shortening in the bilateral basal ganglia may reflect hepatic disease or other mineral deposition.   # Mild cerebral atrophy. No disproportionate atrophy seen in the medial temporal lobe hippocampi to suggest Alzheimer's disease   #  Diffusion-weighted images are normal indicating no acute infarct.   #  No evidence mass or hemorrhage. No abnormal enhancement.  #   # Ventricles: Normal without dilatation, mass effect, or midline shift.   # Sella and parasellar region: Normal.  # Craniovertebral junction: Normal.   # Mastoids and paranasal sinuses: Clear.  # Orbits: Normal   # Major cerebral vessels  and dural sinuses: Patent.  # Bony structures and scalp: Unremarkable.      Assessment & Plan:  I personally reviewed all images and lab data in the [facility] system as well as any outside material available during this office visit and agree with the  radiology impressions.   Diabetic neuropathy (HCC) Significant diabetic neuropathy affecting both feet patient also has severe hammertoe bilaterally  There is severe chronic pain in both feet and podiatry has recommended an orthotic shoe.  The patient's diabetic comprehensive plan includes high-dose Metformin and Jardiance along with dietary modifications.  Note today her A1c is down to 6.6 which should afford significant improvement in her neuropathy.  The diabetic shoes are necessary to protect her feet.  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

## 2020-04-13 ENCOUNTER — Other Ambulatory Visit: Payer: Self-pay

## 2020-04-13 ENCOUNTER — Encounter: Payer: Self-pay | Admitting: Critical Care Medicine

## 2020-04-13 ENCOUNTER — Ambulatory Visit: Payer: Medicare Other | Attending: Critical Care Medicine | Admitting: Critical Care Medicine

## 2020-04-13 VITALS — BP 105/69 | HR 74 | Ht 64.0 in | Wt 151.0 lb

## 2020-04-13 DIAGNOSIS — E1140 Type 2 diabetes mellitus with diabetic neuropathy, unspecified: Secondary | ICD-10-CM | POA: Insufficient documentation

## 2020-04-13 DIAGNOSIS — M2042 Other hammer toe(s) (acquired), left foot: Secondary | ICD-10-CM | POA: Diagnosis not present

## 2020-04-13 DIAGNOSIS — E1142 Type 2 diabetes mellitus with diabetic polyneuropathy: Secondary | ICD-10-CM | POA: Diagnosis not present

## 2020-04-13 DIAGNOSIS — E039 Hypothyroidism, unspecified: Secondary | ICD-10-CM | POA: Diagnosis not present

## 2020-04-13 DIAGNOSIS — Z733 Stress, not elsewhere classified: Secondary | ICD-10-CM | POA: Diagnosis not present

## 2020-04-13 DIAGNOSIS — G8929 Other chronic pain: Secondary | ICD-10-CM | POA: Diagnosis not present

## 2020-04-13 DIAGNOSIS — Z881 Allergy status to other antibiotic agents status: Secondary | ICD-10-CM | POA: Insufficient documentation

## 2020-04-13 DIAGNOSIS — M2041 Other hammer toe(s) (acquired), right foot: Secondary | ICD-10-CM | POA: Diagnosis not present

## 2020-04-13 DIAGNOSIS — E892 Postprocedural hypoparathyroidism: Secondary | ICD-10-CM

## 2020-04-13 DIAGNOSIS — M47814 Spondylosis without myelopathy or radiculopathy, thoracic region: Secondary | ICD-10-CM | POA: Diagnosis not present

## 2020-04-13 DIAGNOSIS — H9201 Otalgia, right ear: Secondary | ICD-10-CM | POA: Insufficient documentation

## 2020-04-13 DIAGNOSIS — Z7984 Long term (current) use of oral hypoglycemic drugs: Secondary | ICD-10-CM | POA: Diagnosis not present

## 2020-04-13 DIAGNOSIS — M1611 Unilateral primary osteoarthritis, right hip: Secondary | ICD-10-CM

## 2020-04-13 DIAGNOSIS — E041 Nontoxic single thyroid nodule: Secondary | ICD-10-CM | POA: Insufficient documentation

## 2020-04-13 DIAGNOSIS — Z79899 Other long term (current) drug therapy: Secondary | ICD-10-CM | POA: Diagnosis not present

## 2020-04-13 DIAGNOSIS — Z597 Insufficient social insurance and welfare support: Secondary | ICD-10-CM | POA: Insufficient documentation

## 2020-04-13 DIAGNOSIS — I10 Essential (primary) hypertension: Secondary | ICD-10-CM | POA: Diagnosis not present

## 2020-04-13 DIAGNOSIS — Z79890 Hormone replacement therapy (postmenopausal): Secondary | ICD-10-CM | POA: Diagnosis not present

## 2020-04-13 DIAGNOSIS — R413 Other amnesia: Secondary | ICD-10-CM | POA: Insufficient documentation

## 2020-04-13 DIAGNOSIS — Z880 Allergy status to penicillin: Secondary | ICD-10-CM | POA: Insufficient documentation

## 2020-04-13 DIAGNOSIS — H401413 Capsular glaucoma with pseudoexfoliation of lens, right eye, severe stage: Secondary | ICD-10-CM

## 2020-04-13 DIAGNOSIS — H409 Unspecified glaucoma: Secondary | ICD-10-CM | POA: Diagnosis not present

## 2020-04-13 DIAGNOSIS — E119 Type 2 diabetes mellitus without complications: Secondary | ICD-10-CM | POA: Diagnosis not present

## 2020-04-13 LAB — GLUCOSE, POCT (MANUAL RESULT ENTRY): POC Glucose: 162 mg/dl — AB (ref 70–99)

## 2020-04-13 MED ORDER — DICLOFENAC SODIUM 1 % EX GEL
CUTANEOUS | 2 refills | Status: DC
  Filled 2020-04-13: qty 100, 12d supply, fill #0
  Filled 2020-05-25: qty 100, 12d supply, fill #1

## 2020-04-13 MED FILL — Prednisolone Acetate Ophth Susp 1%: OPHTHALMIC | 30 days supply | Qty: 5 | Fill #0 | Status: AC

## 2020-04-13 NOTE — Patient Instructions (Signed)
No change in your medications  Please follow-up with orthopedics you have strong consideration having the hip replaced  Please going to schedule for a mammogram  You may give you a kit to obtain a stool sample so we can get your colon cancer screening done  A walker was issued to you today to help with ambulation  Bring the immigration papers and I will fill that out sitting you cannot sit for naturalization immigration exam  Return to see Dr. Joya Gaskins 3 months  Paperwork for the [facility] clinic will be reissued there was some additional supporting information may need                                                                   3         [place]          la taghyir fi al'adwiat alkhasat bik yurjaa almutabaeat mae jirahay aleizam ladayk ahtimam kabir biastibdal mufasal alwarak yurjaa ACGBKORJ lijadwalat altaswir alshueaeii lilthady yumkinuk 'iietawuk majmueat 'adawat lilhusul ealaa eayinat min alburaz hataa natamakan min 'iijra' fahs saratan alqawlun tama 'iisdar mashayat lak alyawm lilmusaeadat fi altamshiy 'ahdir 'awraq alhijrat wasa'amla aljalsat alati la [place] altaqadum fiha liaimtihan altajanus walhijra aleawdat liruyat duktur rayt 3 shuhur sayatimu 'iieadat 'iisdar al'awraq alkhasat bieiadat [facility] , hayth qad tahtaj 'iilaa baed almaelumat aldaaeimat [phone]

## 2020-04-13 NOTE — Assessment & Plan Note (Signed)
Patient compliant with Synthroid continue same

## 2020-04-13 NOTE — Assessment & Plan Note (Signed)
Continue Rocaltrol 

## 2020-04-13 NOTE — Assessment & Plan Note (Signed)
Significant diabetic neuropathy affecting both feet patient also has severe hammertoe bilaterally  There is severe chronic pain in both feet and podiatry has recommended an orthotic shoe.  The patient's diabetic comprehensive plan includes high-dose Metformin and Jardiance along with dietary modifications.  Note today her A1c is down to 6.6 which should afford significant improvement in her neuropathy.  The diabetic shoes are necessary to protect her feet.

## 2020-04-13 NOTE — Assessment & Plan Note (Signed)
Type 2 diabetes without use of insulin improved on Metformin and Jardiance continue current plan of care

## 2020-04-13 NOTE — Assessment & Plan Note (Signed)
On multiple eyedrops now see as per ophthalmology

## 2020-04-13 NOTE — Assessment & Plan Note (Signed)
End-stage osteoarthritis right hip  Needs hip replacement I encouraged the patient to give consideration to this I also issued a walker to the patient today and trained her on his proper use

## 2020-04-14 ENCOUNTER — Other Ambulatory Visit: Payer: Self-pay

## 2020-04-14 DIAGNOSIS — E119 Type 2 diabetes mellitus without complications: Secondary | ICD-10-CM

## 2020-04-14 LAB — POCT GLYCOSYLATED HEMOGLOBIN (HGB A1C): HbA1c, POC (controlled diabetic range): 6.6 % (ref 0.0–7.0)

## 2020-04-15 ENCOUNTER — Telehealth: Payer: Self-pay

## 2020-04-15 ENCOUNTER — Other Ambulatory Visit: Payer: Self-pay

## 2020-04-15 MED FILL — Dorzolamide HCl-Timolol Maleate Ophth Soln 2-0.5%: OPHTHALMIC | 75 days supply | Qty: 10 | Fill #1 | Status: AC

## 2020-04-15 NOTE — Telephone Encounter (Signed)
Pt called to r/s and request rx for pain.

## 2020-04-16 ENCOUNTER — Other Ambulatory Visit: Payer: Self-pay | Admitting: Physical Medicine and Rehabilitation

## 2020-04-16 ENCOUNTER — Telehealth: Payer: Self-pay

## 2020-04-16 ENCOUNTER — Ambulatory Visit: Payer: Medicare Other | Admitting: Physical Medicine and Rehabilitation

## 2020-04-16 MED ORDER — HYDROCODONE-ACETAMINOPHEN 5-325 MG PO TABS: 1.0000 | ORAL_TABLET | Freq: Two times a day (BID) | ORAL | 0 refills | Status: DC | PRN

## 2020-04-16 NOTE — Telephone Encounter (Signed)
I sent in a small prescription for hydrocodone that she had had in the past by Dr. Roda Shutters.  Further pain medications but need to be addressed by Dr. Roda Shutters your primary care physician.  This was sent to her pharmacy other than the [facility] which does not distribute pain medications.

## 2020-04-16 NOTE — Telephone Encounter (Signed)
Pending approval on 4/14.

## 2020-04-16 NOTE — Telephone Encounter (Signed)
Left message to advise that prescription has been sent to [facility] and that further prescriptions need to go through Dr. Roda Shutters or the patient's PCP.

## 2020-04-16 NOTE — Telephone Encounter (Signed)
See message below concerning approval

## 2020-04-16 NOTE — Telephone Encounter (Signed)
ALISSA PHARR ([facility] - [id number] HYDROcodone-Acetaminophen 5-325MG  tablets     Status: PA Request  Created: April 14th, 2022  Sent: April 14th, 2022  Open  Archive    [facility] [place] for Rx -Hydrocodone-acetaminophen    PENDING APPROVAL.

## 2020-04-19 MED FILL — Brimonidine Tartrate-Timolol Maleate Ophth Soln 0.2-0.5%: OPHTHALMIC | 37 days supply | Qty: 5 | Fill #0 | Status: AC

## 2020-04-19 MED FILL — Prednisolone Acetate Ophth Susp 1%: OPHTHALMIC | 30 days supply | Qty: 5 | Fill #1 | Status: CN

## 2020-04-19 MED FILL — Brimonidine Tartrate Ophth Soln 0.2%: OPHTHALMIC | 37 days supply | Qty: 5 | Fill #0 | Status: AC

## 2020-04-20 ENCOUNTER — Other Ambulatory Visit: Payer: Self-pay

## 2020-04-21 ENCOUNTER — Other Ambulatory Visit: Payer: Self-pay

## 2020-04-21 ENCOUNTER — Ambulatory Visit (HOSPITAL_COMMUNITY): Payer: Medicare Other | Attending: Cardiovascular Disease

## 2020-04-21 DIAGNOSIS — R0602 Shortness of breath: Secondary | ICD-10-CM | POA: Diagnosis not present

## 2020-04-21 DIAGNOSIS — R002 Palpitations: Secondary | ICD-10-CM | POA: Insufficient documentation

## 2020-04-22 LAB — ECHOCARDIOGRAM COMPLETE
Area-P 1/2: 2.48 cm2
S' Lateral: 1.8 cm

## 2020-04-27 ENCOUNTER — Other Ambulatory Visit: Payer: Self-pay

## 2020-04-27 MED FILL — Empagliflozin Tab 10 MG: ORAL | 30 days supply | Qty: 30 | Fill #0 | Status: AC

## 2020-04-27 MED FILL — Calcitriol Cap 0.25 MCG: ORAL | 90 days supply | Qty: 180 | Fill #1 | Status: CN

## 2020-04-29 ENCOUNTER — Ambulatory Visit: Payer: Self-pay

## 2020-04-29 ENCOUNTER — Other Ambulatory Visit: Payer: Self-pay

## 2020-04-29 ENCOUNTER — Encounter: Payer: Self-pay | Admitting: Physical Medicine and Rehabilitation

## 2020-04-29 ENCOUNTER — Ambulatory Visit: Payer: Medicare Other | Admitting: Physical Medicine and Rehabilitation

## 2020-04-29 DIAGNOSIS — M25551 Pain in right hip: Secondary | ICD-10-CM

## 2020-04-29 NOTE — Progress Notes (Signed)
Pt state right hip pain that travels to her right leg. Pt state sitting makes the pain worse. Pt state she take pain meds to help ease her pain.  Numeric Pain Rating Scale and Functional Assessment Average Pain 5   In the last MONTH (on 0-10 scale) has pain interfered with the following?  1. General activity like being  able to carry out your everyday physical activities such as walking, climbing stairs, carrying groceries, or moving a chair?  Rating(8)    -BT, -Dye Allergies.

## 2020-05-01 ENCOUNTER — Other Ambulatory Visit: Payer: Self-pay

## 2020-05-01 DIAGNOSIS — H26493 Other secondary cataract, bilateral: Secondary | ICD-10-CM | POA: Diagnosis not present

## 2020-05-01 DIAGNOSIS — H401413 Capsular glaucoma with pseudoexfoliation of lens, right eye, severe stage: Secondary | ICD-10-CM | POA: Diagnosis not present

## 2020-05-01 DIAGNOSIS — Z961 Presence of intraocular lens: Secondary | ICD-10-CM | POA: Diagnosis not present

## 2020-05-01 DIAGNOSIS — H401422 Capsular glaucoma with pseudoexfoliation of lens, left eye, moderate stage: Secondary | ICD-10-CM | POA: Diagnosis not present

## 2020-05-01 MED ORDER — DORZOLAMIDE HCL-TIMOLOL MAL 2-0.5 % OP SOLN
OPHTHALMIC | 3 refills | Status: DC
  Filled 2020-05-01: qty 10, 90d supply, fill #0
  Filled 2020-05-25: qty 10, 37d supply, fill #0

## 2020-05-05 ENCOUNTER — Ambulatory Visit: Payer: Medicare Other | Attending: Critical Care Medicine | Admitting: Pharmacist

## 2020-05-05 ENCOUNTER — Other Ambulatory Visit: Payer: Self-pay

## 2020-05-05 DIAGNOSIS — E119 Type 2 diabetes mellitus without complications: Secondary | ICD-10-CM | POA: Diagnosis not present

## 2020-05-05 NOTE — Progress Notes (Signed)
    S:     PCP: Dr. Delford Field  Patient arrives in good spirits accompanied with her son as interpreter. Presents for diabetes evaluation, education, and management. Patient was referred and last seen by Primary Care Provider on 02/26/20. Last seen by clinical pharmacist on 04/01/2020 - Trulicity was stopped due to injection site reaction and patient was switched to [place].  Today, patient reports medication adherence with metformin twice daily and Jardiance daily. Denies signs and symptoms of hypoglycemia. Son provides home BG log today (see below). Denies medication related side effects.  Family/Social History:  -Fhx: diabetes in mother -Tobacco use: denies   Insurance coverage/medication affordability: [place]  Medication adherence reported.   Current diabetes medications include: Jardiance 10 mg daily, metformin 850 mg BID Current hypertension medications include: amlodipine 5 mg daily, losartan 25 mg daily Current hyperlipidemia medications include: atorvastatin 20 mg daily  Patient denies hypoglycemic events.  O:  Home Blood Sugars: Date AM Fasting Post Breakfast  4/24 88 140  4/25 89 151  4/26 79 170  4/27 112 184  4/28 126 110  4/29 104 137  4/30 97 119    Lab Results  Component Value Date   HGBA1C 6.6 04/14/2020   There were no vitals filed for this visit.  Lipid Panel     Component Value Date/Time   CHOL 138 04/17/2019 0948   TRIG 50 04/17/2019 0948   HDL 68 04/17/2019 0948   CHOLHDL 2.0 04/17/2019 0948   LDLCALC 59 04/17/2019 0948   Clinical Atherosclerotic Cardiovascular Disease (ASCVD): No  The 10-year ASCVD risk score Denman George DC Jr., et al., 2013) is: 15.8%   Values used to calculate the score:     Age: 70 years     Sex: Female     Is Non-Hispanic African American: No     Diabetic: Yes     Tobacco smoker: No     Systolic Blood Pressure: 105 mmHg     Is BP treated: Yes     HDL Cholesterol: 68 mg/dL     Total Cholesterol: 138 mg/dL     A/P: Diabetes longstanding currently controlled. Medication adherence appears appropriate. A1c has significantly improved and is at goal with an A1c of 6.6 - will continue current diabetes regimen today. Ordering a BMET today to assess kidney function.    -Continued current regimen with metformin and Jardiance  -Extensively discussed pathophysiology of diabetes, recommended lifestyle interventions, dietary effects on blood sugar control -Counseled on s/sx of and management of hypoglycemia -Next A1C anticipated July 2022.   ASCVD risk - primary prevention in patient with diabetes. Last LDL is controlled. ASCVD risk score is not >20%  - moderate intensity statin indicated.  -Continued atorvastatin 20 mg daily  Written patient instructions provided. Total time in face to face counseling 15 minutes.   Follow up with PCP in July.    Theodis Sato, PharmD PGY-1 [facility] Pharm[facility] Resident   05/05/2020 2:22 PM

## 2020-05-06 ENCOUNTER — Encounter: Payer: Self-pay | Admitting: Orthopaedic Surgery

## 2020-05-06 ENCOUNTER — Ambulatory Visit (INDEPENDENT_AMBULATORY_CARE_PROVIDER_SITE_OTHER): Payer: Medicare Other | Admitting: Orthopaedic Surgery

## 2020-05-06 VITALS — Ht 64.0 in | Wt 151.0 lb

## 2020-05-06 DIAGNOSIS — M1611 Unilateral primary osteoarthritis, right hip: Secondary | ICD-10-CM | POA: Diagnosis not present

## 2020-05-06 HISTORY — DX: Unilateral primary osteoarthritis, right hip: M16.11

## 2020-05-06 LAB — BASIC METABOLIC PANEL
BUN/Creatinine Ratio: 34 — ABNORMAL HIGH (ref 12–28)
BUN: 32 mg/dL — ABNORMAL HIGH (ref 8–27)
CO2: 23 mmol/L (ref 20–29)
Calcium: 9.3 mg/dL (ref 8.7–10.3)
Chloride: 102 mmol/L (ref 96–106)
Creatinine, Ser: 0.93 mg/dL (ref 0.57–1.00)
Glucose: 173 mg/dL — ABNORMAL HIGH (ref 65–99)
Potassium: 4.3 mmol/L (ref 3.5–5.2)
Sodium: 140 mmol/L (ref 134–144)
eGFR: 66 mL/min/{1.73_m2} (ref >59)

## 2020-05-06 MED ORDER — TRIAMCINOLONE ACETONIDE 40 MG/ML IJ SUSP
60.0000 mg | INTRAMUSCULAR | Status: AC | PRN
  Administered 2020-04-29: 60 mg via INTRA_ARTICULAR

## 2020-05-06 MED ORDER — BUPIVACAINE HCL 0.25 % IJ SOLN
4.0000 mL | INTRAMUSCULAR | Status: AC | PRN
  Administered 2020-04-29: 4 mL via INTRA_ARTICULAR

## 2020-05-06 NOTE — Progress Notes (Signed)
Office Visit Note   Patient: Norma Clay           Date of Birth: 1950/10/27           MRN: [id number] Visit Date: 05/06/2020              Requested by: Storm Frisk, MD [address] [place],  [place] [address] PCP: Storm Frisk, MD   Assessment & Plan: Visit Diagnoses:  1. Primary osteoarthritis of right hip     Plan: I do feel that she would benefit from a hip replacement at this point.  She agrees and her son agrees as well.  This was explained through an interpreter.  I described in detail what the surgery involves using a hip model and described the interoperative and postoperative course.  I went over her x-rays as well.  We described in detail the risks and benefits of surgery.  At this point given the failure of conservative treatment and her continued right hip pain combined with the osteoarthritis, she is interested in proceeding with hip replacement surgery.  I agree with this as well and we will work on getting her scheduled.  All questions and concerns were answered and addressed.  Follow-Up Instructions: Return for 2 weeks post-op.   Orders:  No orders of the defined types were placed in this encounter.  No orders of the defined types were placed in this encounter.     Procedures: No procedures performed   Clinical Data: No additional findings.   Subjective: Chief Complaint  Patient presents with  . Right Hip - Pain  The patient is a 70 year old active female that I am seeing for the first time for known and well-documented right hip pain and right hip osteoarthritis.  She actually saw 1 my partners before but her son who is with her wants me to take over her care.  She has had at least 3 intra-articular steroid injections in that right hip over time.  The first injection did great and she was able to get rid of her cane.  The second injection did work but for not as long.  She recently had a steroid injection earlier this year in the right hip  joint under direct fluoroscopy and this went only helped for a few days.  She embolus with a cane.  Her right hip pain is daily and it is detriment affecting her mobility, quality of life and actives daily living.  Her pain is 10 out of 10 on a daily basis.  She is here also with an interpreter.  She denies any significant left hip pain but there is some left hip pain.  Her right hip pain has become severe.  She is a diabetic but her last hemoglobin A1c was 6.6.  She is still working on better blood glucose control.  Overall though she seems to be doing better except for the debilitating pain involving her right hip this been going on for well over 12 months.  She is also been through hip strengthening exercises.  HPI  Review of Systems She currently denies any headache, chest pain, shortness of breath, fever, chills, nausea, vomiting  Objective: Vital Signs: Ht 5\' 4"  (1.626 m)   Wt 151 lb (68.5 kg)   BMI 25.92 kg/m   Physical Exam She is alert and oriented and follows commands appropriately through the interpreter Ortho Exam Examination of her right hip shows significant stiffness with attempts of rotation.  There is a lot  of pain in the groin with rotating her right hip as well.  Her left hip moves more smoothly.  I did have her stand up straight and upright and this did cause significant right hip and groin pain. Specialty Comments:  No specialty comments available.  Imaging: No results found. Previous x-rays reviewed of her pelvis and right hip does show severe osteoarthritis of the right hip with joint space narrowing and particular osteophytes as well as sclerotic changes.  PMFS History: Patient Active Problem List   Diagnosis Date Noted  . Primary osteoarthritis of right hip 05/06/2020  . Diabetic neuropathy (HCC) 04/13/2020  . Colon cancer screening 12/16/2019  . Acquired hypothyroidism 12/16/2019  . Hyperlipidemia associated with type 2 diabetes mellitus (HCC) 12/16/2019  .  Hammer toes of both feet 12/16/2019  . Degenerative arthritis of lumbar spine 12/16/2019  . 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  . Essential hypertension 02/19/2019  . Postsurgical states following surgery of eye and adnexa 10/01/2018  . Pseudophakia of both eyes 04/23/2018  . 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 Osteoarthritis of right hip 05/16/2016  . Type 2 diabetes mellitus without complication, without long-term current use of insulin (HCC) 04/18/2016   Past Medical History:  Diagnosis Date  . Diabetes mellitus without complication (HCC)   . Glaucoma   . Hypertension   . Posterior capsular opacification of both eyes, 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    Family History  Problem Relation Age of Onset  . Diabetes Mother   . Colon cancer Neg Hx   . Stomach cancer Neg Hx   . Esophageal cancer Neg Hx     Past Surgical History:  Procedure Laterality Date  . GLAUCOMA SURGERY    . THYROIDECTOMY     Social History   Occupational History  . Not on file  Tobacco Use  . Smoking status: Never Smoker  . Smokeless tobacco: Never Used  Vaping Use  . Vaping Use: Never used  Substance and Sexual Activity  . Alcohol use: No  . Drug use: No  . Sexual activity: Not Currently

## 2020-05-06 NOTE — Progress Notes (Signed)
   Winola Drum - 70 y.o. female MRN [id number]  Date of birth: July 22, 1950  Office Visit Note: Visit Date: 04/29/2020 PCP: Storm Frisk, MD Referred by: Storm Frisk, MD  Subjective: Chief Complaint  Patient presents with  . Right Hip - Pain   HPI:  Shauna Bodkins is a 70 y.o. female who comes in today for planned repeat Left anesthetic hip arthrogram with fluoroscopic guidance.  The patient has failed conservative care including home exercise, medications, time and activity modification. Prior injection gave more than 50% relief for several months. This injection will be diagnostic and hopefully therapeutic.  Please see requesting physician notes for further details and justification.  Referring: Dr. Glee Arvin    ROS Otherwise per HPI.  Assessment & Plan: Visit Diagnoses:    ICD-10-CM   1. Pain in right hip  M25.551 XR C-ARM NO REPORT    Large Joint Inj: R hip joint    Plan: No additional findings.   Meds & Orders: No orders of the defined types were placed in this encounter.   Orders Placed This Encounter  Procedures  . Large Joint Inj: R hip joint  . XR C-ARM NO REPORT    Follow-up: Return if symptoms worsen or fail to improve.   Procedures: Large Joint Inj: R hip joint on 04/29/2020 2:22 PM Indications: diagnostic evaluation and pain Details: 22 G 3.5 in needle, fluoroscopy-guided anterior approach  Arthrogram: No  Medications: 4 mL bupivacaine 0.25 %; 60 mg triamcinolone acetonide 40 MG/ML Outcome: tolerated well, no immediate complications  There was excellent flow of contrast producing a partial arthrogram of the hip. The patient did have relief of symptoms during the anesthetic phase of the injection. Procedure, treatment alternatives, risks and benefits explained, specific risks discussed. Consent was given by the patient. Immediately prior to procedure a time out was called to verify the correct patient, procedure, equipment, support staff and  site/side marked as required. Patient was prepped and draped in the usual sterile fashion.          Clinical History: No specialty comments available.     Objective:  VS:  HT:    WT:   BMI:     BP:   HR: bpm  TEMP: ( )  RESP:  Physical Exam   Imaging: No results found.

## 2020-05-10 IMAGING — DX DG THORACIC SPINE 2V
3 series · 3 of 3 positions shown · non-contrast
Comparison: Chest CTA dated 05/02/2014.

CLINICAL DATA: Mid and lower back pain. Abdominal pain. No known
injury.

EXAM:
THORACIC SPINE 2 VIEWS

[t-spine ap]
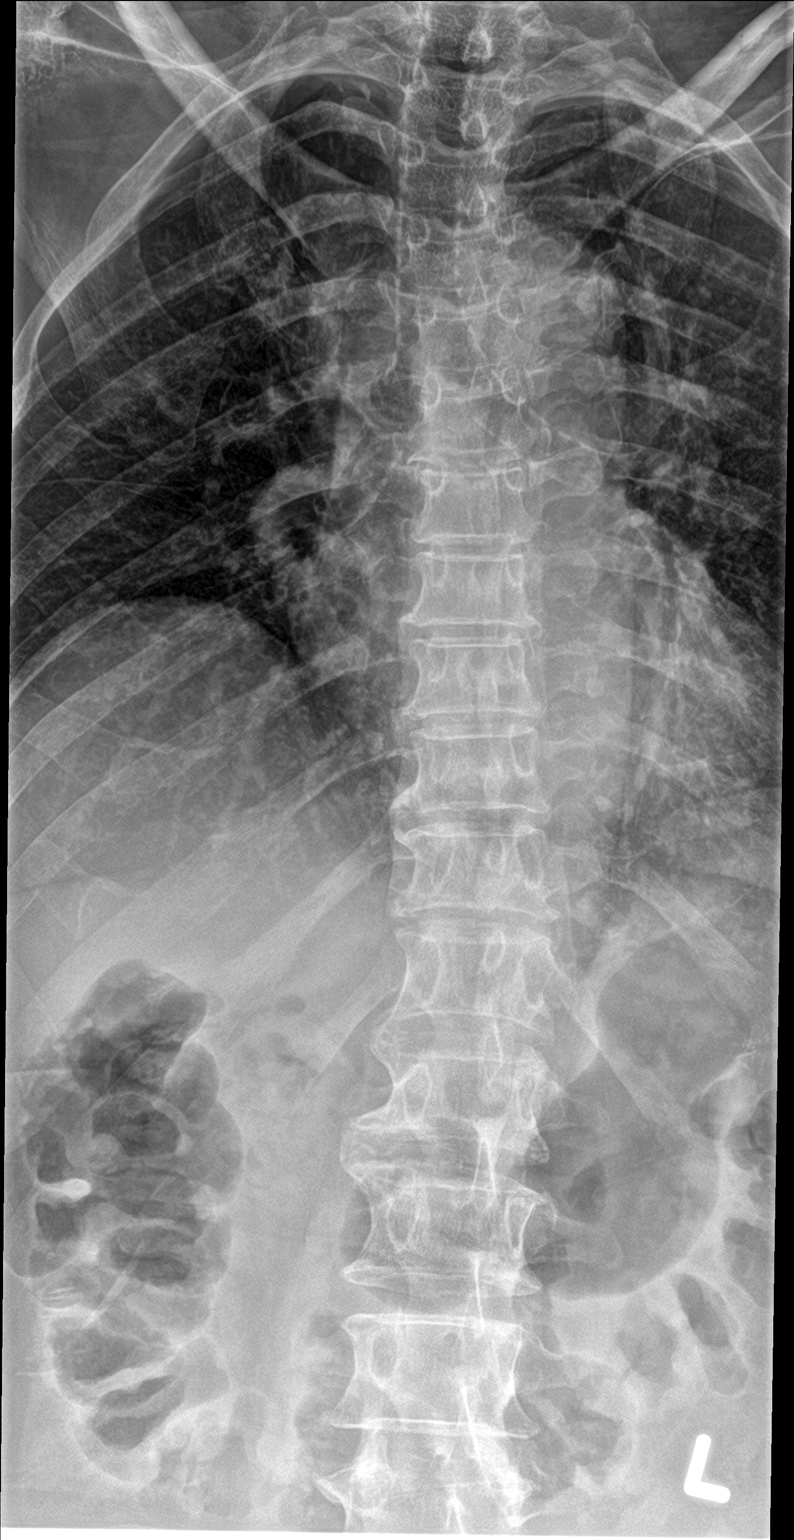

[t-spine lat]
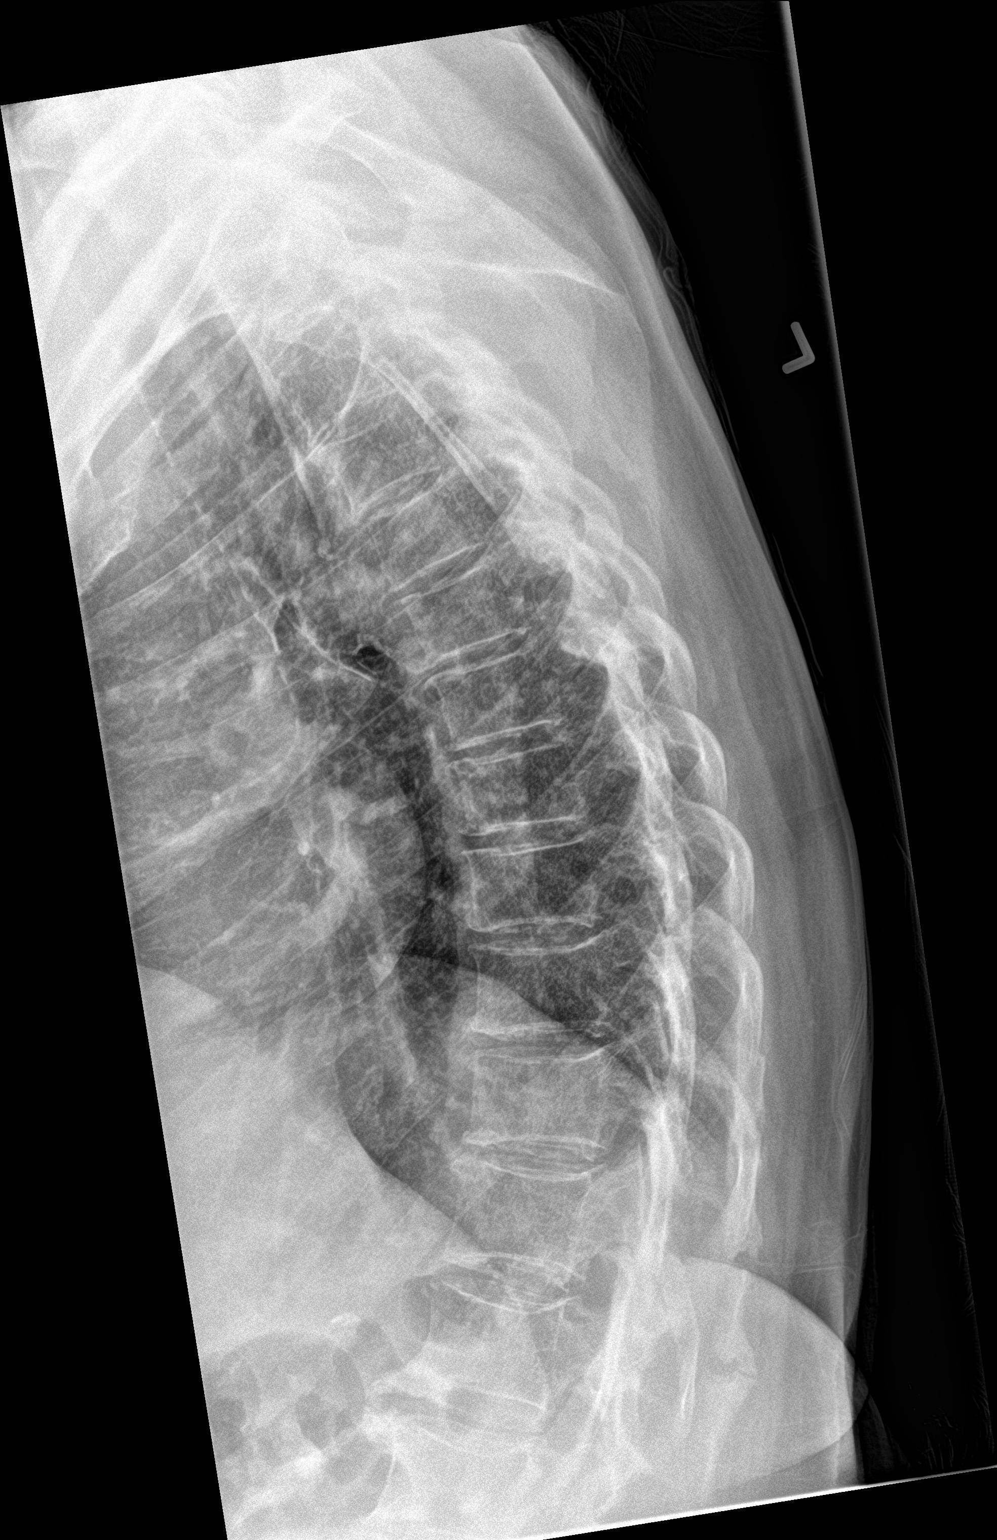

[t-spine swimmers]
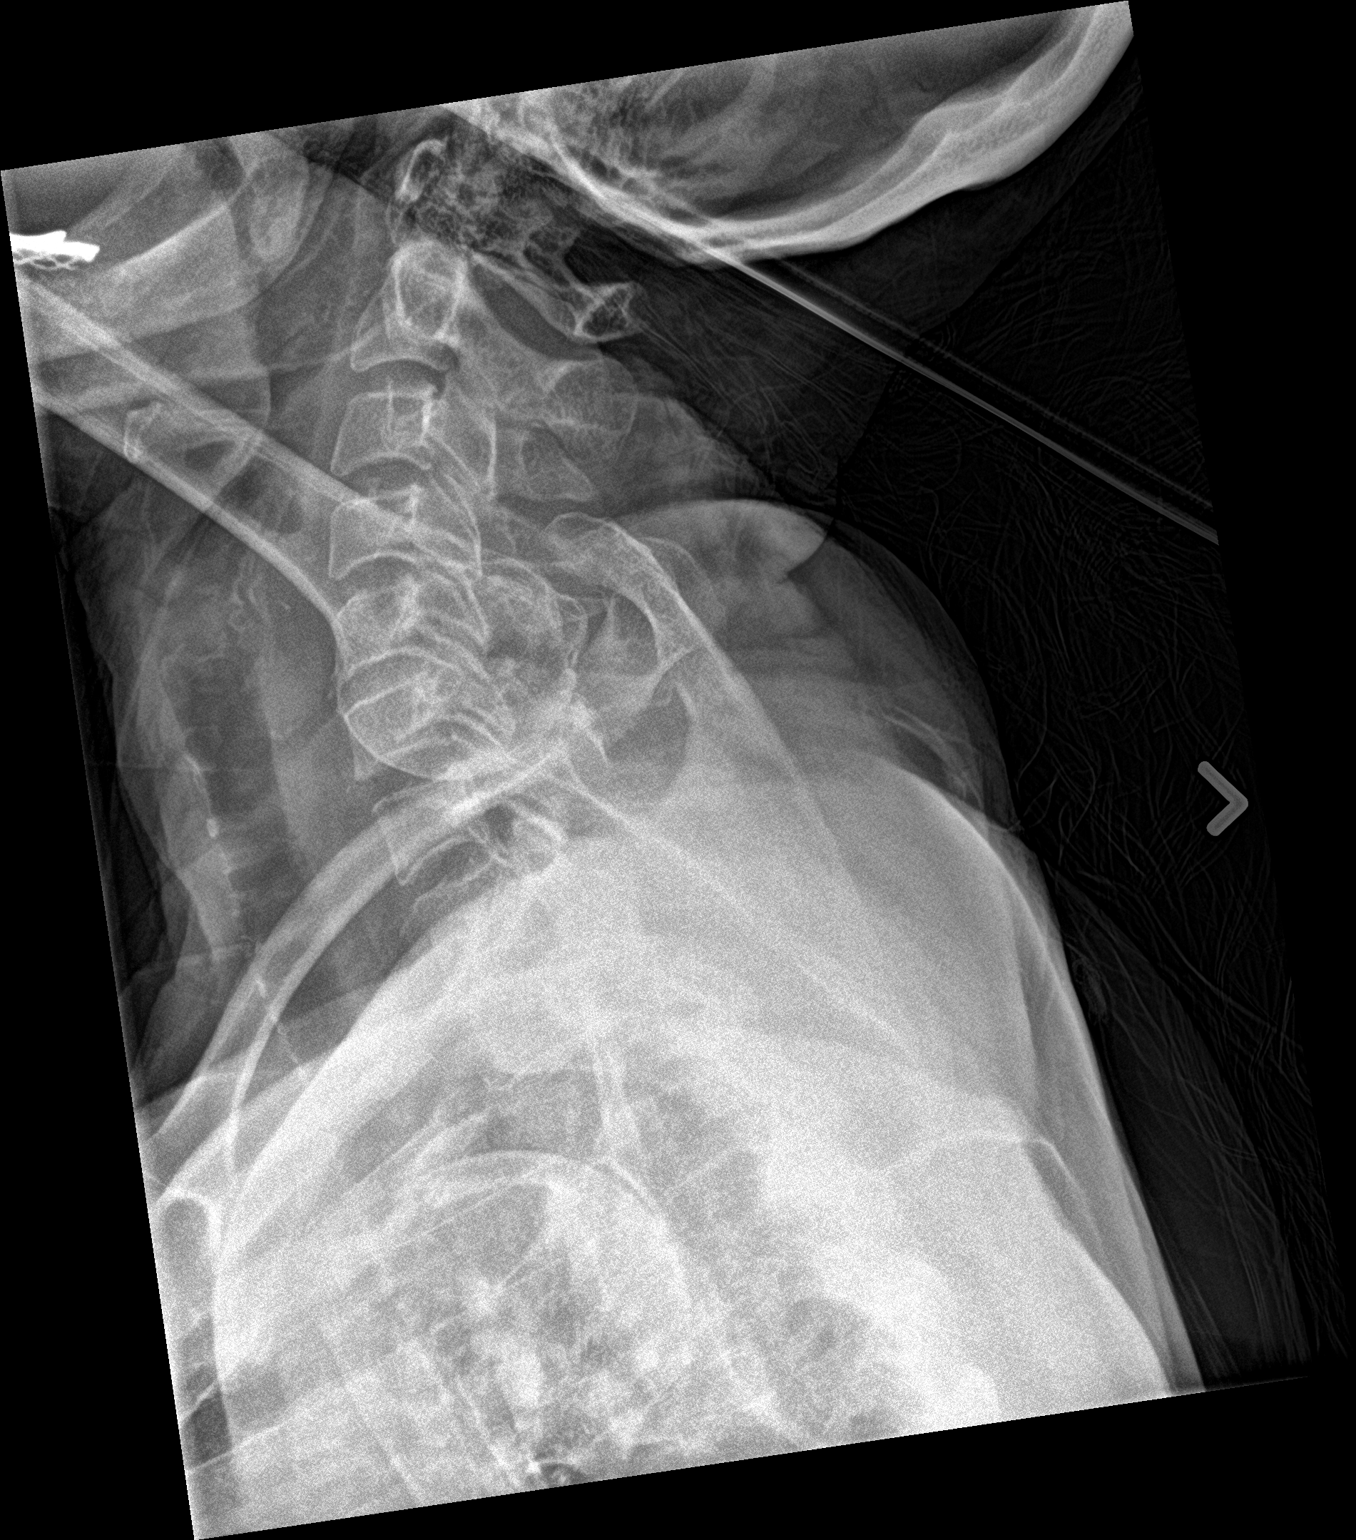

[3 of 3 positions shown; findings below may reference images not displayed]

FINDINGS: Mild anterior and mild to moderate lateral spur formation at
multiple levels of the thoracic spine. No fractures or subluxations
are seen. Mild scoliosis.
IMPRESSION: Degenerative changes. No acute abnormality.

## 2020-05-25 MED FILL — Calcitriol Cap 0.25 MCG: ORAL | 30 days supply | Qty: 60 | Fill #1 | Status: CN

## 2020-05-25 MED FILL — Losartan Potassium Tab 25 MG: ORAL | 30 days supply | Qty: 30 | Fill #1 | Status: CN

## 2020-05-25 MED FILL — Travoprost Ophth Soln 0.004% (Benzalkonium Free) (BAK Free): OPHTHALMIC | 18 days supply | Qty: 2.5 | Fill #0 | Status: AC

## 2020-05-25 MED FILL — Amlodipine Besylate Tab 5 MG (Base Equivalent): ORAL | 30 days supply | Qty: 30 | Fill #1 | Status: CN

## 2020-05-25 MED FILL — Metformin HCl Tab 850 MG: ORAL | 30 days supply | Qty: 60 | Fill #1 | Status: CN

## 2020-05-25 MED FILL — Empagliflozin Tab 10 MG: ORAL | 30 days supply | Qty: 30 | Fill #1 | Status: AC

## 2020-05-26 ENCOUNTER — Other Ambulatory Visit: Payer: Self-pay

## 2020-05-27 ENCOUNTER — Other Ambulatory Visit: Payer: Self-pay

## 2020-05-27 MED FILL — Brimonidine Tartrate Ophth Soln 0.2%: OPHTHALMIC | 37 days supply | Qty: 5 | Fill #1 | Status: AC

## 2020-05-28 NOTE — Progress Notes (Signed)
Name: Norma Clay  MRN/ DOB: [id number], [birth year]    Age/ Sex: 70 y.o., female     PCP: Elsie Stain, MD   Reason for Endocrinology Evaluation: Post-surgical hypoparathyroidism     Initial Endocrinology Clinic Visit: 12/08/17    PATIENT IDENTIFIER: Norma Clay is a 70 y.o., female with a past medical history of Total thyroidectomy and T2DM. She has followed with [facility] since 12/08/2017 for consultative assistance with management of her Post-surgical hypoparathyroidism.   HISTORICAL SUMMARY:  She  moved from [place] in 2019. 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 She denies history of thyroid cancer. Pt noted hand spasms following 2nd surgery and has been on Calcium and Calcitriol since. Historically has compliance issues which results in serum calcium fluctuations.  24-hr urine collection normal 06/2018  SUBJECTIVE:   Today (05/28/2020):  [name] is here with her daughter Guido Sander. She is here for a follow up on hypocalcemia and hypothyroidism    She  has noted localized anterior neck tenderness mainly on the right  Has occasional dysphagia   Denies  numbness or perioral tingling. Has occasional constipation  Has pending right hip replacement    Medication: Calcium carbonate (oyster shell) 500 mg, 1 tabs TID  Calcitriol 0.25 MCG, 2 caps daily Vitamin D3 400 IU daily     HISTORY:  Past Medical History:  Past Medical History:  Diagnosis Date  . Diabetes mellitus without complication ([place])   . Glaucoma   . Hypertension   . Posterior capsular opacification of both eyes, 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   Past Surgical History:  Past Surgical History:  Procedure Laterality Date  . GLAUCOMA SURGERY    . THYROIDECTOMY      Social History:  reports that she has never smoked. She has never used smokeless tobacco. She reports that she does not drink alcohol  and does not use drugs.  Family History: family history includes Diabetes in her mother.   HOME MEDICATIONS: Allergies as of 05/29/2020      Reactions   Clindamycin/lincomycin Rash   Penicillins Rash   Patient was placed on a form of penicillin by her dentist and developed a rash therefore medication was changed to clindamycin.  Per son, patient is not allergic to clindamycin      Medication List       Accurate as of May 28, 2020  8:34 PM. If you have any questions, ask your nurse or doctor.        Accu-Chek Guide Me w/Device Kit Use to check TID.   Accu-Chek Guide test strip Generic drug: glucose blood USE TO CHECK 3 TIMES DAILY   Accu-Chek Softclix Lancets lancets USE AS INSTRUCTED USE TO CHECK 3 TIMES DAILY   acetaminophen 325 MG tablet Commonly known as: Tylenol Take 1-2 tablets (325-650 mg total) by mouth every 6 (six) hours as needed for moderate pain.   amLODipine 5 MG tablet Commonly known as: NORVASC TAKE 1 TABLET (5 MG TOTAL) BY MOUTH DAILY. TO LOWER BLOOD PRESSURE   atorvastatin 20 MG tablet Commonly known as: LIPITOR Take 1 tablet (20 mg total) by mouth daily.   brimonidine 0.2 % ophthalmic solution Commonly known as: ALPHAGAN PLACE 1 DROP INTO THE RIGHT EYE 2 (TWO) TIMES DAILY   calcitRIOL 0.25 MCG capsule Commonly known as: ROCALTROL TAKE 2 CAPSULES (0.5 MCG TOTAL) BY MOUTH DAILY.   calcium carbonate 1500 (600  Ca) MG Tabs tablet Commonly known as: OSCAL Take 1 tablet (1,500 mg total) by mouth 3 (three) times daily with meals.   Combigan 0.2-0.5 % ophthalmic solution Generic drug: brimonidine-timolol PLACE 1 DROP INTO THE RIGHT EYE 2 (TWO) TIMES DAILY   diclofenac 75 MG EC tablet Commonly known as: VOLTAREN Take 75 mg by mouth 2 (two) times daily as needed.   diclofenac Sodium 1 % Gel Commonly known as: VOLTAREN RUB 2 GRAMS INTO AFFECTED AREA OF FOOT 2 TO 4 TIMES DAILY   dorzolamide-timolol 22.3-6.8 MG/ML ophthalmic solution Commonly  known as: COSOPT PLACE 1 DROP INTO THE LEFT EYE 2 (TWO) TIMES DAILY   dorzolamide-timolol 22.3-6.8 MG/ML ophthalmic solution Commonly known as: COSOPT Place 1 drop into both eyes 2 (two) times daily   HYDROcodone-acetaminophen 5-325 MG tablet Commonly known as: Norco Take 1 tablet by mouth 2 (two) times daily as needed.   Jardiance 10 MG Tabs tablet Generic drug: empagliflozin TAKE 1 TABLET (10 MG TOTAL) BY MOUTH DAILY BEFORE BREAKFAST.   levothyroxine 100 MCG tablet Commonly known as: SYNTHROID Take 1 tablet (100 mcg total) by mouth daily before breakfast.   Lidocaine 4 % Ptch Commonly known as: HM Lidocaine Patch Apply to foot for no more than 12 hours   losartan 25 MG tablet Commonly known as: COZAAR TAKE 1 TABLET (25 MG TOTAL) BY MOUTH DAILY.   metFORMIN 850 MG tablet Commonly known as: GLUCOPHAGE TAKE 1 TABLET (850 MG TOTAL) BY MOUTH 2 (TWO) TIMES DAILY WITH A MEAL.   prednisoLONE acetate 1 % ophthalmic suspension Commonly known as: PRED FORTE INSTILL 1 DROP RIGHT EYE TWICE A DAY   Travatan Z 0.004 % Soln ophthalmic solution Generic drug: Travoprost (BAK Free) INSTILL 1 DROP BOTH EYES EVERY EVENING   Vitamin D3 10 MCG (400 UNIT) tablet Take 1 tablet (400 Units total) by mouth daily.         OBJECTIVE:   PHYSICAL EXAM: VS:BP 108/72   Pulse 68   Ht _0  (1.626 m)   Wt 145 lb 4 oz (65.9 kg)   SpO2 98%   BMI 24.93 kg/m   EXAM: General: Pt appears well and is in NAD Soft localized swelling at the anterior neck with tenderness   Lungs: Clear with good BS bilat with no rales, rhonchi, or wheezes  Heart: Auscultation: RRR.  Extremities: BL LE: Trace pretibial edema   Mental Status: Judgment, insight: Intact Mood and affect: No depression, anxiety, or agitation     DATA REVIEWED: Results for SELINA, TAPPER (MRN [id number]) as of 06/01/2020 19:08  Ref. Range 05/29/2020 11:06  Calcium Latest Ref Range: 8.4 - 10.5 mg/dL 9.2  Albumin Latest Ref Range:  3.5 - 5.2 g/dL 3.6  VITD Latest Ref Range: 30.00 - 100.00 ng/mL 36.37  TSH Latest Ref Range: 0.35 - 4.50 uIU/mL 0.31 (L)    Thyroid ULtrasound 12/2020 The thyroid gland is surgically absent. Evaluation of the left resection bed demonstrates no evidence of residual or recurrent thyroid tissue.  However, evaluation of the right thyroid resection bed demonstrates a recurrent rounded nodular focus of soft tissue measuring 2.2 x 1.3 x 1.1 cm. The structure is solid and mildly heterogeneous in appearance. No internal calcifications or punctate echogenic foci. No suspicious lymphadenopathy.  IMPRESSION: 1. Recurrent nodule in the right thyroid resection bed. If the patient's prior thyroidectomy was performed for malignancy, then fine-needle aspiration biopsy is recommended. 2. Surgical changes of thyroidectomy without evidence of recurrent thyroid tissue or nodule in the  left resection bed.   ASSESSMENT / PLAN / RECOMMENDATIONS:   1. Surgical Hypoparathyroidism :    - Pt is asymptomatic  - Calcium level is 9.52 mg/dL ( corrected )  This slightly above goal of 8-9 mg/dL). I have recommended reducing Calcitriol to 1 cap ( instead of 2 ), son is reluctant on making any changes due to upcoming sx and doesn't want any delays, will continue current dose for now  - Will proceed with 24- hr urine collection for calcium excretion the last one was 2 yrs ago    Medications:  Continue Calcium Carbonate 500 mg 1 tabs TID Continue  Calcitriol 0.25 mcg ( 2 Caps ) daily   Continue Vitamin D3 400 iu daily     2. Postsurgical hypothyroidism :  - Clinically euthyroid  - Pt with local neck symptoms, will proceed with thyroid bed ultrasound  - TSH is low, will reduce the dose as below    Stop levothyroxine 100 mcg Start levothyroxine 88 mcg daily   F/u in 6 months   Addendum: spoke to son Deforest Hoyles on 06/02/2020 at 1220 pm. Discussed reducing levothyroxine. Will repeat TSH in 6-8 weeks      Signed electronically by: Mack Guise, MD  [facility]  [address] [place], [place] [address] Phone: [phone] FAX: [phone]      CC: Elsie Stain, MD [address] [place] [address] Phone: [phone]  Fax: [phone]   Return to Endocrinology clinic as below: Future Appointments  Date Time Provider [place]  05/29/2020 10:30 AM Tailey Top, Melanie Crazier, MD [facility] None  07/07/2020 10:45 AM Mcarthur Rossetti, MD [facility] None  07/13/2020  9:30 AM Elsie Stain, MD [facility] None

## 2020-05-29 ENCOUNTER — Encounter: Payer: Self-pay | Admitting: Internal Medicine

## 2020-05-29 ENCOUNTER — Other Ambulatory Visit: Payer: Self-pay

## 2020-05-29 ENCOUNTER — Ambulatory Visit (INDEPENDENT_AMBULATORY_CARE_PROVIDER_SITE_OTHER): Payer: Medicare Other | Admitting: Internal Medicine

## 2020-05-29 VITALS — BP 108/72 | HR 68 | Ht 64.0 in | Wt 145.2 lb

## 2020-05-29 DIAGNOSIS — E892 Postprocedural hypoparathyroidism: Secondary | ICD-10-CM | POA: Diagnosis not present

## 2020-05-29 DIAGNOSIS — E039 Hypothyroidism, unspecified: Secondary | ICD-10-CM

## 2020-05-29 LAB — ALBUMIN: Albumin: 3.6 g/dL (ref 3.5–5.2)

## 2020-05-29 LAB — VITAMIN D 25 HYDROXY (VIT D DEFICIENCY, FRACTURES): VITD: 36.37 ng/mL (ref 30.00–100.00)

## 2020-05-29 LAB — TSH: TSH: 0.31 u[IU]/mL — ABNORMAL LOW (ref 0.35–4.50)

## 2020-05-29 LAB — CALCIUM: Calcium: 9.2 mg/dL (ref 8.4–10.5)

## 2020-05-29 NOTE — Patient Instructions (Signed)
-   Continue Calcium Carbonate  1 tablet three times daily for now  - Continue Calcitriol two capsules a day  - Continue Over the counter Vitamin D3 400 iu daily   24-Hour Urine Collection   You will be collecting your urine for a 24-hour period of time.  Your timer starts with your first urine of the morning (For example - If you first pee at 9AM, your timer will start at 9AM)  Throw away your first urine of the morning  Collect your urine every time you pee for the next 24 hours STOP your urine collection 24 hours after you started the collection (For example - You would stop at 9AM the day after you started)

## 2020-06-02 ENCOUNTER — Other Ambulatory Visit: Payer: Self-pay

## 2020-06-02 MED ORDER — LEVOTHYROXINE SODIUM 88 MCG PO TABS
88.0000 ug | ORAL_TABLET | Freq: Every day | ORAL | 3 refills | Status: DC
  Filled 2020-06-02: qty 90, 90d supply, fill #0
  Filled 2020-07-26 – 2020-08-24 (×2): qty 90, 90d supply, fill #1

## 2020-06-03 ENCOUNTER — Other Ambulatory Visit: Payer: Self-pay

## 2020-06-03 ENCOUNTER — Other Ambulatory Visit: Payer: Medicare Other

## 2020-06-03 DIAGNOSIS — E892 Postprocedural hypoparathyroidism: Secondary | ICD-10-CM

## 2020-06-04 LAB — CALCIUM, URINE, 24 HOUR: Calcium, 24H Urine: 213 mg/(24.h)

## 2020-06-04 LAB — CREATININE, URINE, 24 HOUR: Creatinine, 24H Ur: 0.46 g/(24.h) — ABNORMAL LOW (ref 0.50–2.15)

## 2020-06-08 ENCOUNTER — Other Ambulatory Visit: Payer: Self-pay

## 2020-06-08 MED ORDER — DORZOLAMIDE HCL-TIMOLOL MAL 2-0.5 % OP SOLN
OPHTHALMIC | 3 refills | Status: AC
  Filled 2020-06-08: qty 10, 37d supply, fill #0
  Filled 2020-06-24: qty 10, 50d supply, fill #0
  Filled 2020-07-26 – 2020-08-04 (×4): qty 10, 50d supply, fill #1

## 2020-06-08 MED ORDER — BRIMONIDINE TARTRATE 0.2 % OP SOLN
OPHTHALMIC | 12 refills | Status: DC
  Filled 2020-06-08 – 2020-06-24 (×2): qty 5, 37d supply, fill #0
  Filled 2020-07-26: qty 5, 37d supply, fill #1

## 2020-06-08 MED ORDER — TRAVOPROST (BAK FREE) 0.004 % OP SOLN
OPHTHALMIC | 6 refills | Status: AC
  Filled 2020-06-08: qty 2.5, 19d supply, fill #0
  Filled 2020-06-24: qty 2.5, 25d supply, fill #0
  Filled 2020-07-26: qty 2.5, 25d supply, fill #1
  Filled 2020-08-24: qty 2.5, 25d supply, fill #2

## 2020-06-09 ENCOUNTER — Other Ambulatory Visit: Payer: Self-pay | Admitting: Physician Assistant

## 2020-06-09 ENCOUNTER — Other Ambulatory Visit: Payer: Self-pay

## 2020-06-19 NOTE — Pre-Procedure Instructions (Signed)
Surgical Instructions    Your procedure is scheduled on Tuesday, June 21st.  Report to [facility] Main Entrance "A" at 10:00 A.M., then check in with the Admitting office.  Call this number if you have problems the morning of surgery:  [phone]   If you have any questions prior to your surgery date call [phone]: Open Monday-Friday 8am-4pm    Remember:  Do not eat after midnight the night before your surgery  You may drink clear liquids until 9:00 a.m. the morning of your surgery.   Clear liquids allowed are: Water, Non-Citrus Juices (without pulp), Carbonated Beverages, Clear Tea, Black Coffee Only, and Gatorade.   Enhanced Recovery after Surgery for Orthopedics Enhanced Recovery after Surgery is a protocol used to improve the stress on your body and your recovery after surgery.  Patient Instructions  The day of surgery (if you have diabetes):  Drink ONE small 10 oz bottle of water by 9:00 am the morning of surgery This bottle was given to you during your hospital  pre-op appointment visit.  Nothing else to drink after completing the  Small 10 oz bottle of water.         If you have questions, please contact your surgeon's office.     Take these medicines the morning of surgery with A SIP OF WATER  amLODipine (NORVASC) atorvastatin (LIPITOR) brimonidine (ALPHAGAN) dorzolamide-timolol (COSOPT)  levothyroxine (SYNTHROID)  acetaminophen (TYLENOL) -as needed for pain.  As of today, STOP taking any Aspirin (unless otherwise instructed by your surgeon) Aleve, Naproxen, Ibuprofen, Motrin, Advil, Goody's, BC's, all herbal medications, fish oil, and all vitamins.          WHAT DO I DO ABOUT MY DIABETES MEDICATION?   Do not take empagliflozin (JARDIANCE) the day before surgery (6/20) or the day of surgery (6/21).  Do not take metFORMIN (GLUCOPHAGE) the day of surgery.   HOW TO MANAGE YOUR DIABETES BEFORE AND AFTER SURGERY  Why is it important to control my blood  sugar before and after surgery? Improving blood sugar levels before and after surgery helps healing and can limit problems. A way of improving blood sugar control is eating a healthy diet by:  Eating less sugar and carbohydrates  Increasing activity/exercise  Talking with your doctor about reaching your blood sugar goals High blood sugars (greater than 180 mg/dL) can raise your risk of infections and slow your recovery, so you will need to focus on controlling your diabetes during the weeks before surgery. Make sure that the doctor who takes care of your diabetes knows about your planned surgery including the date and location.  How do I manage my blood sugar before surgery? Check your blood sugar at least 4 times a day, starting 2 days before surgery, to make sure that the level is not too high or low.  Check your blood sugar the morning of your surgery when you wake up and every 2 hours until you get to the Short Stay unit.  If your blood sugar is less than 70 mg/dL, you will need to treat for low blood sugar: Do not take insulin. Treat a low blood sugar (less than 70 mg/dL) with  cup of clear juice (cranberry or apple), 4 glucose tablets, OR glucose gel. Recheck blood sugar in 15 minutes after treatment (to make sure it is greater than 70 mg/dL). If your blood sugar is not greater than 70 mg/dL on recheck, call [phone] for further instructions. Report your blood sugar to the short stay nurse  when you get to Short Stay.  If you are admitted to the hospital after surgery: Your blood sugar will be checked by the staff and you will probably be given insulin after surgery (instead of oral diabetes medicines) to make sure you have good blood sugar levels. The goal for blood sugar control after surgery is 80-180 mg/dL.   Do NOT Smoke (Tobacco/Vaping) or drink Alcohol 24 hours prior to your procedure.  If you use a CPAP at night, you may bring all equipment for your overnight stay.    Contacts, glasses, piercing's, hearing aid's, dentures or partials may not be worn into surgery, please bring cases for these belongings.    For patients admitted to the hospital, discharge time will be determined by your treatment team.   Patients discharged the day of surgery will not be allowed to drive home, and someone needs to stay with them for 24 hours.    Special instructions:   [facility]- Preparing For Surgery  Before surgery, you can play an important role. Because skin is not sterile, your skin needs to be as free of germs as possible. You can reduce the number of germs on your skin by washing with CHG (chlorahexidine gluconate) Soap before surgery.  CHG is an antiseptic cleaner which kills germs and bonds with the skin to continue killing germs even after washing.    Oral Hygiene is also important to reduce your risk of infection.  Remember - BRUSH YOUR TEETH THE MORNING OF SURGERY WITH YOUR REGULAR TOOTHPASTE  Please do not use if you have an allergy to CHG or antibacterial soaps. If your skin becomes reddened/irritated stop using the CHG.  Do not shave (including legs and underarms) for at least 48 hours prior to first CHG shower. It is OK to shave your face.  Please follow these instructions carefully.   Shower the NIGHT BEFORE SURGERY and the MORNING OF SURGERY  If you chose to wash your hair, wash your hair first as usual with your normal shampoo.  After you shampoo, rinse your hair and body thoroughly to remove the shampoo.  Use CHG Soap as you would any other liquid soap. You can apply CHG directly to the skin and wash gently with a scrungie or a clean washcloth.   Apply the CHG Soap to your body ONLY FROM THE NECK DOWN.  Do not use on open wounds or open sores. Avoid contact with your eyes, ears, mouth and genitals (private parts). Wash Face and genitals (private parts)  with your normal soap.   Wash thoroughly, paying special attention to the area where your  surgery will be performed.  Thoroughly rinse your body with warm water from the neck down.  DO NOT shower/wash with your normal soap after using and rinsing off the CHG Soap.  Pat yourself dry with a CLEAN TOWEL.  Wear CLEAN PAJAMAS to bed the night before surgery  Place CLEAN SHEETS on your bed the night before your surgery  DO NOT SLEEP WITH PETS.   Day of Surgery: Shower with CHG soap. Do not wear jewelry, make up, or nail polish. Artifical nails must be removed prior to surgery.  Do not wear lotions, powders, perfumes, or deodorant. Do not shave 48 hours prior to surgery.   Do not bring valuables to the hospital. [facility] is not responsible for any belongings or valuables. Wear Clean/Comfortable clothing the morning of surgery Remember to brush your teeth WITH YOUR REGULAR TOOTHPASTE.   Please read over  the following fact sheets that you were given.

## 2020-06-22 ENCOUNTER — Encounter (HOSPITAL_COMMUNITY): Payer: Self-pay

## 2020-06-22 ENCOUNTER — Other Ambulatory Visit: Payer: Self-pay

## 2020-06-22 ENCOUNTER — Encounter (HOSPITAL_COMMUNITY)
Admission: RE | Admit: 2020-06-22 | Discharge: 2020-06-22 | Disposition: A | Discharge status: Home / Self Care | Payer: Medicare Other | Source: Ambulatory Visit | Attending: Orthopaedic Surgery | Admitting: Orthopaedic Surgery

## 2020-06-22 DIAGNOSIS — Z20822 Contact with and (suspected) exposure to covid-19: Secondary | ICD-10-CM | POA: Diagnosis not present

## 2020-06-22 DIAGNOSIS — Z01818 Encounter for other preprocedural examination: Secondary | ICD-10-CM | POA: Insufficient documentation

## 2020-06-22 LAB — BASIC METABOLIC PANEL
Anion gap: 7 (ref 5–15)
BUN: 22 mg/dL (ref 8–23)
CO2: 27 mmol/L (ref 22–32)
Calcium: 8.8 mg/dL — ABNORMAL LOW (ref 8.9–10.3)
Chloride: 103 mmol/L (ref 98–111)
Creatinine, Ser: 0.88 mg/dL (ref 0.44–1.00)
GFR, Estimated: >60 mL/min (ref >60)
Glucose, Bld: 101 mg/dL — ABNORMAL HIGH (ref 70–99)
Potassium: 4 mmol/L (ref 3.5–5.1)
Sodium: 137 mmol/L (ref 135–145)

## 2020-06-22 LAB — CBC
HCT: 36.2 % (ref 36.0–46.0)
Hemoglobin: 11.9 g/dL — ABNORMAL LOW (ref 12.0–15.0)
MCH: 30.9 pg (ref 26.0–34.0)
MCHC: 32.9 g/dL (ref 30.0–36.0)
MCV: 94 fL (ref 80.0–100.0)
Platelets: 213 10*3/uL (ref 150–400)
RBC: 3.85 MIL/uL — ABNORMAL LOW (ref 3.87–5.11)
RDW: 14 % (ref 11.5–15.5)
WBC: 6.1 10*3/uL (ref 4.0–10.5)
nRBC: 0 % (ref 0.0–0.2)

## 2020-06-22 LAB — SARS CORONAVIRUS 2 (TAT 6-24 HRS): SARS Coronavirus 2: NEGATIVE

## 2020-06-22 LAB — GLUCOSE, CAPILLARY: Glucose-Capillary: 176 mg/dL — ABNORMAL HIGH (ref 70–99)

## 2020-06-22 LAB — TYPE AND SCREEN
ABO/RH(D): O POS
Antibody Screen: NEGATIVE

## 2020-06-22 LAB — SURGICAL PCR SCREEN
MRSA, PCR: POSITIVE — AB
Staphylococcus aureus: POSITIVE — AB

## 2020-06-22 NOTE — Progress Notes (Signed)
PCP - Dr. Shan Levans Cardiologist - denies  Chest x-ray - n/a EKG - 06/22/20 Stress Test - denies ECHO - 04/11/20 Cardiac Cath -denies   Sleep Study - denies CPAP - denies  Fasting Blood Sugar - 80-99 Checks Blood Sugar 2 times a day CBG in PAT176. Pt had breakfast prior to appointment Will collect A1C today.  Blood Thinner Instructions: n/a Aspirin Instructions: n/a  ERAS Protcol - clear liquids until 0900 DOS.  PRE-SURGERY Ensure or G2- Pt given 10oz bottle of water.  COVID TEST- 06/22/20 in PAT  Anesthesia review: No  Patient denies shortness of breath, fever, cough and chest pain at PAT appointment   All instructions explained to the patient, with a verbal understanding of the material. Patient agrees to go over the instructions while at home for a better understanding. Patient also instructed to self quarantine after being tested for COVID-19. The opportunity to ask questions was provided.  Pt speaks [place] Arabic. Due to dialect specification, pt's son requested that he is able to stay the night. In the past, IPAD interpreters and in-person interpreters were not effective. Permission granted by Interior and spatial designer of [employer], Maureen Ralphs and Edwina Barth.

## 2020-06-23 ENCOUNTER — Other Ambulatory Visit: Payer: Self-pay

## 2020-06-23 ENCOUNTER — Ambulatory Visit (HOSPITAL_COMMUNITY): Payer: Medicare Other | Admitting: Anesthesiology

## 2020-06-23 ENCOUNTER — Encounter (HOSPITAL_COMMUNITY)
Admission: RE | Disposition: A | Discharge status: Home Health | Payer: Self-pay | Source: Home / Self Care | Attending: Orthopaedic Surgery

## 2020-06-23 ENCOUNTER — Ambulatory Visit (HOSPITAL_COMMUNITY): Payer: Medicare Other

## 2020-06-23 ENCOUNTER — Inpatient Hospital Stay (HOSPITAL_COMMUNITY)
Admission: RE | Admit: 2020-06-23 | Discharge: 2020-06-25 | DRG: 470 | Disposition: A | Discharge status: Home Health | Payer: Medicare Other | Attending: Orthopaedic Surgery | Admitting: Orthopaedic Surgery

## 2020-06-23 ENCOUNTER — Encounter (HOSPITAL_COMMUNITY): Payer: Self-pay | Admitting: Orthopaedic Surgery

## 2020-06-23 ENCOUNTER — Observation Stay (HOSPITAL_COMMUNITY): Payer: Medicare Other

## 2020-06-23 DIAGNOSIS — M1611 Unilateral primary osteoarthritis, right hip: Secondary | ICD-10-CM | POA: Diagnosis not present

## 2020-06-23 DIAGNOSIS — Z90710 Acquired absence of both cervix and uterus: Secondary | ICD-10-CM

## 2020-06-23 DIAGNOSIS — E785 Hyperlipidemia, unspecified: Secondary | ICD-10-CM | POA: Diagnosis present

## 2020-06-23 DIAGNOSIS — M25751 Osteophyte, right hip: Secondary | ICD-10-CM | POA: Diagnosis not present

## 2020-06-23 DIAGNOSIS — E890 Postprocedural hypothyroidism: Secondary | ICD-10-CM | POA: Diagnosis present

## 2020-06-23 DIAGNOSIS — Z880 Allergy status to penicillin: Secondary | ICD-10-CM

## 2020-06-23 DIAGNOSIS — E1140 Type 2 diabetes mellitus with diabetic neuropathy, unspecified: Secondary | ICD-10-CM | POA: Diagnosis not present

## 2020-06-23 DIAGNOSIS — E1169 Type 2 diabetes mellitus with other specified complication: Secondary | ICD-10-CM | POA: Diagnosis not present

## 2020-06-23 DIAGNOSIS — H401410 Capsular glaucoma with pseudoexfoliation of lens, right eye, stage unspecified: Secondary | ICD-10-CM | POA: Diagnosis present

## 2020-06-23 DIAGNOSIS — Z9181 History of falling: Secondary | ICD-10-CM

## 2020-06-23 DIAGNOSIS — Z833 Family history of diabetes mellitus: Secondary | ICD-10-CM | POA: Diagnosis not present

## 2020-06-23 DIAGNOSIS — E892 Postprocedural hypoparathyroidism: Secondary | ICD-10-CM | POA: Diagnosis not present

## 2020-06-23 DIAGNOSIS — H401420 Capsular glaucoma with pseudoexfoliation of lens, left eye, stage unspecified: Secondary | ICD-10-CM | POA: Diagnosis present

## 2020-06-23 DIAGNOSIS — Z96641 Presence of right artificial hip joint: Secondary | ICD-10-CM

## 2020-06-23 DIAGNOSIS — Z419 Encounter for procedure for purposes other than remedying health state, unspecified: Secondary | ICD-10-CM

## 2020-06-23 DIAGNOSIS — I10 Essential (primary) hypertension: Secondary | ICD-10-CM | POA: Diagnosis not present

## 2020-06-23 HISTORY — PX: TOTAL HIP ARTHROPLASTY: SHX124

## 2020-06-23 LAB — HEMOGLOBIN A1C
Hgb A1c MFr Bld: 7.4 % — ABNORMAL HIGH (ref 4.8–5.6)
Mean Plasma Glucose: 166 mg/dL

## 2020-06-23 LAB — GLUCOSE, CAPILLARY
Glucose-Capillary: 101 mg/dL — ABNORMAL HIGH (ref 70–99)
Glucose-Capillary: 124 mg/dL — ABNORMAL HIGH (ref 70–99)
Glucose-Capillary: 92 mg/dL (ref 70–99)

## 2020-06-23 LAB — ABO/RH: ABO/RH(D): O POS

## 2020-06-23 SURGERY — ARTHROPLASTY, HIP, TOTAL, ANTERIOR APPROACH
Anesthesia: Monitor Anesthesia Care | Site: Hip | Laterality: Right

## 2020-06-23 MED ORDER — CHOLECALCIFEROL 10 MCG (400 UNIT) PO TABS
400.0000 [IU] | ORAL_TABLET | Freq: Every day | ORAL | Status: DC
  Administered 2020-06-24 – 2020-06-25 (×2): 400 [IU] via ORAL
  Filled 2020-06-23 (×3): qty 1

## 2020-06-23 MED ORDER — FENTANYL CITRATE (PF) 100 MCG/2ML IJ SOLN
INTRAMUSCULAR | Status: AC
  Filled 2020-06-23: qty 2

## 2020-06-23 MED ORDER — CLINDAMYCIN PHOSPHATE 900 MG/50ML IV SOLN
900.0000 mg | INTRAVENOUS | Status: AC
  Administered 2020-06-23: 900 mg via INTRAVENOUS

## 2020-06-23 MED ORDER — ORAL CARE MOUTH RINSE: 15.0000 mL | Freq: Once | OROMUCOSAL | Status: AC

## 2020-06-23 MED ORDER — MENTHOL 3 MG MT LOZG: 1.0000 | LOZENGE | OROMUCOSAL | Status: DC | PRN

## 2020-06-23 MED ORDER — PHENYLEPHRINE HCL-NACL 10-0.9 MG/250ML-% IV SOLN
INTRAVENOUS | Status: DC | PRN
  Administered 2020-06-23: 25 ug/min via INTRAVENOUS

## 2020-06-23 MED ORDER — OXYCODONE HCL 5 MG/5ML PO SOLN: 5.0000 mg | Freq: Once | ORAL | Status: DC | PRN

## 2020-06-23 MED ORDER — VANCOMYCIN HCL IN DEXTROSE 1-5 GM/200ML-% IV SOLN: 1000.0000 mg | Freq: Once | INTRAVENOUS | Status: AC

## 2020-06-23 MED ORDER — ASPIRIN 81 MG PO CHEW
81.0000 mg | CHEWABLE_TABLET | Freq: Two times a day (BID) | ORAL | Status: DC
  Administered 2020-06-23 – 2020-06-25 (×4): 81 mg via ORAL
  Filled 2020-06-23 (×4): qty 1

## 2020-06-23 MED ORDER — ONDANSETRON HCL 4 MG/2ML IJ SOLN: 4.0000 mg | Freq: Four times a day (QID) | INTRAMUSCULAR | Status: DC | PRN

## 2020-06-23 MED ORDER — ONDANSETRON HCL 4 MG/2ML IJ SOLN
INTRAMUSCULAR | Status: AC
  Filled 2020-06-23: qty 2

## 2020-06-23 MED ORDER — MORPHINE SULFATE (PF) 2 MG/ML IV SOLN: 0.5000 mg | INTRAVENOUS | Status: DC | PRN

## 2020-06-23 MED ORDER — PHENOL 1.4 % MT LIQD: 1.0000 | OROMUCOSAL | Status: DC | PRN

## 2020-06-23 MED ORDER — METOCLOPRAMIDE HCL 5 MG PO TABS: 5.0000 mg | ORAL_TABLET | Freq: Three times a day (TID) | ORAL | Status: DC | PRN

## 2020-06-23 MED ORDER — LEVOTHYROXINE SODIUM 88 MCG PO TABS
88.0000 ug | ORAL_TABLET | Freq: Every day | ORAL | Status: DC
  Administered 2020-06-24 – 2020-06-25 (×2): 88 ug via ORAL
  Filled 2020-06-23 (×2): qty 1

## 2020-06-23 MED ORDER — EMPAGLIFLOZIN 10 MG PO TABS
10.0000 mg | ORAL_TABLET | Freq: Every day | ORAL | Status: DC
  Administered 2020-06-24 – 2020-06-25 (×2): 10 mg via ORAL
  Filled 2020-06-23 (×2): qty 1

## 2020-06-23 MED ORDER — METHOCARBAMOL 500 MG PO TABS: 500.0000 mg | ORAL_TABLET | Freq: Four times a day (QID) | ORAL | Status: DC | PRN

## 2020-06-23 MED ORDER — METHOCARBAMOL 1000 MG/10ML IJ SOLN: 500.0000 mg | Freq: Four times a day (QID) | INTRAVENOUS | Status: DC | PRN

## 2020-06-23 MED ORDER — ALUM & MAG HYDROXIDE-SIMETH 200-200-20 MG/5ML PO SUSP: 30.0000 mL | ORAL | Status: DC | PRN

## 2020-06-23 MED ORDER — HYDROCODONE-ACETAMINOPHEN 7.5-325 MG PO TABS: 1.0000 | ORAL_TABLET | ORAL | Status: DC | PRN

## 2020-06-23 MED ORDER — EPHEDRINE 5 MG/ML INJ: 5.0000 mg | INTRAVENOUS | Status: DC | PRN

## 2020-06-23 MED ORDER — CALCITRIOL 0.5 MCG PO CAPS
0.5000 ug | ORAL_CAPSULE | Freq: Every day | ORAL | Status: DC
  Administered 2020-06-24 – 2020-06-25 (×2): 0.5 ug via ORAL
  Filled 2020-06-23 (×2): qty 1

## 2020-06-23 MED ORDER — TRAMADOL HCL 50 MG PO TABS
50.0000 mg | ORAL_TABLET | Freq: Four times a day (QID) | ORAL | Status: DC
  Administered 2020-06-23 – 2020-06-25 (×7): 50 mg via ORAL
  Filled 2020-06-23 (×7): qty 1

## 2020-06-23 MED ORDER — SODIUM CHLORIDE 0.9 % IR SOLN
Status: DC | PRN
  Administered 2020-06-23: 1000 mL

## 2020-06-23 MED ORDER — ALBUMIN HUMAN 5 % IV SOLN
INTRAVENOUS | Status: AC
  Filled 2020-06-23: qty 250

## 2020-06-23 MED ORDER — FENTANYL CITRATE (PF) 250 MCG/5ML IJ SOLN
INTRAMUSCULAR | Status: AC
  Filled 2020-06-23: qty 5

## 2020-06-23 MED ORDER — METOCLOPRAMIDE HCL 5 MG/ML IJ SOLN: 5.0000 mg | Freq: Three times a day (TID) | INTRAMUSCULAR | Status: DC | PRN

## 2020-06-23 MED ORDER — TRANEXAMIC ACID-NACL 1000-0.7 MG/100ML-% IV SOLN
1000.0000 mg | INTRAVENOUS | Status: AC
  Administered 2020-06-23: 1000 mg via INTRAVENOUS

## 2020-06-23 MED ORDER — BUPIVACAINE IN DEXTROSE 0.75-8.25 % IT SOLN
INTRATHECAL | Status: DC | PRN
  Administered 2020-06-23: 1.8 mL via INTRATHECAL

## 2020-06-23 MED ORDER — ONDANSETRON HCL 4 MG PO TABS: 4.0000 mg | ORAL_TABLET | Freq: Four times a day (QID) | ORAL | Status: DC | PRN

## 2020-06-23 MED ORDER — CLINDAMYCIN PHOSPHATE 900 MG/50ML IV SOLN
INTRAVENOUS | Status: AC
  Filled 2020-06-23: qty 50

## 2020-06-23 MED ORDER — LACTATED RINGERS IV SOLN: INTRAVENOUS | Status: DC

## 2020-06-23 MED ORDER — ALBUMIN HUMAN 5 % IV SOLN
12.5000 g | Freq: Once | INTRAVENOUS | Status: AC
  Administered 2020-06-23: 12.5 g via INTRAVENOUS

## 2020-06-23 MED ORDER — MIDAZOLAM HCL 2 MG/2ML IJ SOLN
INTRAMUSCULAR | Status: AC
  Filled 2020-06-23: qty 2

## 2020-06-23 MED ORDER — ACETAMINOPHEN 325 MG PO TABS: 325.0000 mg | ORAL_TABLET | Freq: Four times a day (QID) | ORAL | Status: DC | PRN

## 2020-06-23 MED ORDER — ONDANSETRON HCL 4 MG/2ML IJ SOLN
4.0000 mg | Freq: Once | INTRAMUSCULAR | Status: AC | PRN
  Administered 2020-06-23: 4 mg via INTRAVENOUS

## 2020-06-23 MED ORDER — SODIUM CHLORIDE 0.9 % IV SOLN: INTRAVENOUS | Status: DC

## 2020-06-23 MED ORDER — EPHEDRINE SULFATE-NACL 50-0.9 MG/10ML-% IV SOSY
PREFILLED_SYRINGE | INTRAVENOUS | Status: DC | PRN
  Administered 2020-06-23 (×6): 10 mg via INTRAVENOUS

## 2020-06-23 MED ORDER — AMLODIPINE BESYLATE 5 MG PO TABS
5.0000 mg | ORAL_TABLET | Freq: Two times a day (BID) | ORAL | Status: DC
  Filled 2020-06-23 (×2): qty 1

## 2020-06-23 MED ORDER — MIDAZOLAM HCL 5 MG/5ML IJ SOLN
INTRAMUSCULAR | Status: DC | PRN
  Administered 2020-06-23: 2 mg via INTRAVENOUS

## 2020-06-23 MED ORDER — ACETAMINOPHEN 500 MG PO TABS
1000.0000 mg | ORAL_TABLET | Freq: Once | ORAL | Status: AC
  Administered 2020-06-23: 1000 mg via ORAL
  Filled 2020-06-23: qty 2

## 2020-06-23 MED ORDER — ONDANSETRON HCL 4 MG/2ML IJ SOLN
INTRAMUSCULAR | Status: DC | PRN
  Administered 2020-06-23: 4 mg via INTRAVENOUS

## 2020-06-23 MED ORDER — PANTOPRAZOLE SODIUM 40 MG PO TBEC
40.0000 mg | DELAYED_RELEASE_TABLET | Freq: Every day | ORAL | Status: DC
  Administered 2020-06-23: 40 mg via ORAL
  Filled 2020-06-23 (×2): qty 1

## 2020-06-23 MED ORDER — METFORMIN HCL 850 MG PO TABS
850.0000 mg | ORAL_TABLET | Freq: Two times a day (BID) | ORAL | Status: DC
  Administered 2020-06-24 – 2020-06-25 (×3): 850 mg via ORAL
  Filled 2020-06-23 (×4): qty 1

## 2020-06-23 MED ORDER — ACETAMINOPHEN 325 MG PO TABS
325.0000 mg | ORAL_TABLET | Freq: Four times a day (QID) | ORAL | Status: DC | PRN
  Administered 2020-06-23: 325 mg via ORAL
  Filled 2020-06-23: qty 1

## 2020-06-23 MED ORDER — EPHEDRINE 5 MG/ML INJ
INTRAVENOUS | Status: AC
  Filled 2020-06-23: qty 10

## 2020-06-23 MED ORDER — FENTANYL CITRATE (PF) 100 MCG/2ML IJ SOLN
25.0000 ug | INTRAMUSCULAR | Status: DC | PRN
  Administered 2020-06-23: 25 ug via INTRAVENOUS

## 2020-06-23 MED ORDER — FENTANYL CITRATE (PF) 100 MCG/2ML IJ SOLN
INTRAMUSCULAR | Status: DC | PRN
  Administered 2020-06-23: 50 ug via INTRAVENOUS

## 2020-06-23 MED ORDER — VANCOMYCIN HCL IN DEXTROSE 1-5 GM/200ML-% IV SOLN
INTRAVENOUS | Status: AC
  Administered 2020-06-23: 1000 mg via INTRAVENOUS
  Filled 2020-06-23: qty 200

## 2020-06-23 MED ORDER — PROPOFOL 10 MG/ML IV BOLUS
INTRAVENOUS | Status: DC | PRN
  Administered 2020-06-23: 20 mg via INTRAVENOUS

## 2020-06-23 MED ORDER — PROPOFOL 500 MG/50ML IV EMUL
INTRAVENOUS | Status: DC | PRN
  Administered 2020-06-23: 50 ug/kg/min via INTRAVENOUS

## 2020-06-23 MED ORDER — CHLORHEXIDINE GLUCONATE 0.12 % MT SOLN
15.0000 mL | Freq: Once | OROMUCOSAL | Status: AC
  Administered 2020-06-23: 15 mL via OROMUCOSAL
  Filled 2020-06-23: qty 15

## 2020-06-23 MED ORDER — VANCOMYCIN HCL 1000 MG/200ML IV SOLN
1000.0000 mg | Freq: Two times a day (BID) | INTRAVENOUS | Status: AC
  Filled 2020-06-23: qty 200

## 2020-06-23 MED ORDER — ATORVASTATIN CALCIUM 10 MG PO TABS
20.0000 mg | ORAL_TABLET | Freq: Every day | ORAL | Status: DC
  Administered 2020-06-23 – 2020-06-24 (×2): 20 mg via ORAL
  Filled 2020-06-23 (×2): qty 2

## 2020-06-23 MED ORDER — GLYCOPYRROLATE 0.2 MG/ML IJ SOLN
0.2000 mg | Freq: Once | INTRAMUSCULAR | Status: AC
  Administered 2020-06-23: 0.2 mg via INTRAVENOUS

## 2020-06-23 MED ORDER — LIDOCAINE HCL (PF) 2 % IJ SOLN
INTRAMUSCULAR | Status: AC
  Filled 2020-06-23: qty 5

## 2020-06-23 MED ORDER — 0.9 % SODIUM CHLORIDE (POUR BTL) OPTIME
TOPICAL | Status: DC | PRN
  Administered 2020-06-23: 1000 mL

## 2020-06-23 MED ORDER — POVIDONE-IODINE 10 % EX SWAB
2.0000 "application " | Freq: Once | CUTANEOUS | Status: AC
  Administered 2020-06-23: 2 via TOPICAL

## 2020-06-23 MED ORDER — HYDROCODONE-ACETAMINOPHEN 5-325 MG PO TABS
1.0000 | ORAL_TABLET | ORAL | Status: DC | PRN
  Administered 2020-06-24: 2 via ORAL
  Filled 2020-06-23: qty 2

## 2020-06-23 MED ORDER — TRANEXAMIC ACID-NACL 1000-0.7 MG/100ML-% IV SOLN
INTRAVENOUS | Status: AC
  Filled 2020-06-23: qty 100

## 2020-06-23 MED ORDER — LOSARTAN POTASSIUM 50 MG PO TABS
25.0000 mg | ORAL_TABLET | Freq: Every day | ORAL | Status: DC
  Filled 2020-06-23 (×2): qty 1

## 2020-06-23 MED ORDER — DOCUSATE SODIUM 100 MG PO CAPS
100.0000 mg | ORAL_CAPSULE | Freq: Two times a day (BID) | ORAL | Status: DC
  Administered 2020-06-23 – 2020-06-25 (×4): 100 mg via ORAL
  Filled 2020-06-23 (×3): qty 1

## 2020-06-23 MED ORDER — LATANOPROST 0.005 % OP SOLN
1.0000 [drp] | Freq: Every day | OPHTHALMIC | Status: DC
  Filled 2020-06-23: qty 2.5

## 2020-06-23 MED ORDER — DORZOLAMIDE HCL-TIMOLOL MAL 2-0.5 % OP SOLN
1.0000 [drp] | Freq: Two times a day (BID) | OPHTHALMIC | Status: DC
  Filled 2020-06-23: qty 10

## 2020-06-23 MED ORDER — GLYCOPYRROLATE 0.2 MG/ML IJ SOLN
INTRAMUSCULAR | Status: AC
  Filled 2020-06-23: qty 1

## 2020-06-23 MED ORDER — DIPHENHYDRAMINE HCL 12.5 MG/5ML PO ELIX: 12.5000 mg | ORAL_SOLUTION | ORAL | Status: DC | PRN

## 2020-06-23 MED ORDER — OXYCODONE HCL 5 MG PO TABS
5.0000 mg | ORAL_TABLET | Freq: Once | ORAL | Status: DC | PRN
Start: 2020-06-23 — End: 2020-06-23

## 2020-06-23 SURGICAL SUPPLY — 52 items
BENZOIN TINCTURE PRP APPL 2/3 (GAUZE/BANDAGES/DRESSINGS) ×2 IMPLANT
BLADE CLIPPER SURG (BLADE) IMPLANT
BLADE SAW SGTL 18X1.27X75 (BLADE) ×2 IMPLANT
COVER SURGICAL LIGHT HANDLE (MISCELLANEOUS) ×2 IMPLANT
COVER WAND RF STERILE (DRAPES) ×2 IMPLANT
CUP SECTOR GRIPTON 50MM (Cup) ×2 IMPLANT
DRAPE C-ARM 42X72 X-RAY (DRAPES) ×2 IMPLANT
DRAPE STERI IOBAN 125X83 (DRAPES) ×2 IMPLANT
DRAPE U-SHAPE 47X51 STRL (DRAPES) ×6 IMPLANT
DRSG AQUACEL AG ADV 3.5X10 (GAUZE/BANDAGES/DRESSINGS) ×2 IMPLANT
DURAPREP 26ML APPLICATOR (WOUND CARE) ×2 IMPLANT
ELECT BLADE 4.0 EZ CLEAN MEGAD (MISCELLANEOUS) ×2
ELECT BLADE 6.5 EXT (BLADE) IMPLANT
ELECT REM PT RETURN 9FT ADLT (ELECTROSURGICAL) ×2
ELECTRODE BLDE 4.0 EZ CLN MEGD (MISCELLANEOUS) ×1 IMPLANT
ELECTRODE REM PT RTRN 9FT ADLT (ELECTROSURGICAL) ×1 IMPLANT
FACESHIELD WRAPAROUND (MASK) ×4 IMPLANT
GLOVE ECLIPSE 8.0 STRL XLNG CF (GLOVE) ×2 IMPLANT
GLOVE ORTHO TXT STRL SZ7.5 (GLOVE) ×4 IMPLANT
GLOVE SRG 8 PF TXTR STRL LF DI (GLOVE) ×2 IMPLANT
GLOVE SURG UNDER POLY LF SZ8 (GLOVE) ×2
GOWN STRL REUS W/ TWL LRG LVL3 (GOWN DISPOSABLE) ×2 IMPLANT
GOWN STRL REUS W/ TWL XL LVL3 (GOWN DISPOSABLE) ×2 IMPLANT
GOWN STRL REUS W/TWL LRG LVL3 (GOWN DISPOSABLE) ×2
GOWN STRL REUS W/TWL XL LVL3 (GOWN DISPOSABLE) ×2
HANDPIECE INTERPULSE COAX TIP (DISPOSABLE) ×1
HEAD FEM STD 32X+1 STRL (Hips) ×2 IMPLANT
KIT BASIN OR (CUSTOM PROCEDURE TRAY) ×2 IMPLANT
KIT TURNOVER KIT B (KITS) ×2 IMPLANT
LINER ACETABULAR 32X50 (Liner) ×2 IMPLANT
MANIFOLD NEPTUNE II (INSTRUMENTS) ×2 IMPLANT
NS IRRIG 1000ML POUR BTL (IV SOLUTION) ×2 IMPLANT
PACK TOTAL JOINT (CUSTOM PROCEDURE TRAY) ×2 IMPLANT
PAD ARMBOARD 7.5X6 YLW CONV (MISCELLANEOUS) ×2 IMPLANT
SET HNDPC FAN SPRY TIP SCT (DISPOSABLE) ×1 IMPLANT
STAPLER VISISTAT 35W (STAPLE) IMPLANT
STEM CORAIL KA10 (Stem) ×2 IMPLANT
STRIP CLOSURE SKIN 1/2X4 (GAUZE/BANDAGES/DRESSINGS) ×4 IMPLANT
SUT ETHIBOND NAB CT1 #1 30IN (SUTURE) ×2 IMPLANT
SUT MNCRL AB 4-0 PS2 18 (SUTURE) IMPLANT
SUT VIC AB 0 CT1 27 (SUTURE) ×1
SUT VIC AB 0 CT1 27XBRD ANBCTR (SUTURE) ×1 IMPLANT
SUT VIC AB 1 CT1 27 (SUTURE) ×1
SUT VIC AB 1 CT1 27XBRD ANBCTR (SUTURE) ×1 IMPLANT
SUT VIC AB 2-0 CT1 27 (SUTURE) ×1
SUT VIC AB 2-0 CT1 TAPERPNT 27 (SUTURE) ×1 IMPLANT
TOWEL GREEN STERILE (TOWEL DISPOSABLE) ×2 IMPLANT
TOWEL GREEN STERILE FF (TOWEL DISPOSABLE) ×2 IMPLANT
TRAY CATH 16FR W/PLASTIC CATH (SET/KITS/TRAYS/PACK) ×2 IMPLANT
TRAY FOLEY W/BAG SLVR 16FR (SET/KITS/TRAYS/PACK)
TRAY FOLEY W/BAG SLVR 16FR ST (SET/KITS/TRAYS/PACK) IMPLANT
WATER STERILE IRR 1000ML POUR (IV SOLUTION) ×4 IMPLANT

## 2020-06-23 NOTE — Brief Op Note (Signed)
06/23/2020  2:57 PM  PATIENT:  [name]  70 y.o. female  PRE-OPERATIVE DIAGNOSIS:  Osteoarthritis Right Hip  POST-OPERATIVE DIAGNOSIS:  Osteoarthritis Right Hip  PROCEDURE:  Procedure(s): RIGHT TOTAL HIP ARTHROPLASTY ANTERIOR APPROACH (Right)  SURGEON:  Surgeon(s) and Role:    Kathryne Hitch, MD - Primary  PHYSICIAN ASSISTANT:  Rexene Edison, PA-C  ANESTHESIA:   spinal  EBL:  200 mL   COUNTS:  YES  DICTATION: .Other Dictation: Dictation Number [id number]  PLAN OF CARE: Admit for overnight observation  PATIENT DISPOSITION:  PACU - hemodynamically stable.   Delay start of Pharmacological VTE agent (>24hrs) due to surgical blood loss or risk of bleeding: no

## 2020-06-23 NOTE — Op Note (Signed)
Norma Clay, Norma Clay MEDICAL RECORD NO: [id number] ACCOUNT NO: [id number] DATE OF BIRTH: 31-Jul-1950 FACILITY: [facility] LOCATION: [facility] PHYSICIAN: Vanita Panda. Magnus Ivan, MD  Operative Report   DATE OF PROCEDURE: 06/23/2020  PREOPERATIVE DIAGNOSIS:  Right hip osteoarthritis with slight protrusio.  POSTOPERATIVE DIAGNOSIS:  Right hip osteoarthritis with slight protrusio.  PROCEDURE:  Right total hip arthroplasty through direct anterior approach.  IMPLANTS:  DePuy Sector Gription acetabular component size 50, size 32+0 neutral polyethylene liner, size 10 Corail femoral component with standard offset, size 32+1 metal hip ball.  SURGEON:  Vanita Panda. Magnus Ivan, MD  ASSISTANT:  Rexene Edison, PA-C  ANESTHESIA:  Spinal.  ANTIBIOTICS:  900 mg IV vancomycin.  ESTIMATED BLOOD LOSS:  200 mL.  COMPLICATIONS:  None.  INDICATIONS:  The patient is a very pleasant 70 year old female with debilitating arthritis involving her right hip.  She is non-English speaking, but I know her son who is a patient of mine.  He speaks [place] well.  We have seen her in the office on  several occasions.  She has end-stage arthritis of her right hip.  It is detrimentally affecting her mobility, her quality of life, and activities of daily living.  She has had at least 3 separate intra-articular steroid injections under imaging in that  right hip joint.  They used to work great.  Now, it does not work anymore.  Her pain is daily, and at this point, she does wish to proceed with a total hip arthroplasty given that she is young and quite active.  We talked about the risk of acute blood  loss anemia, nerve or vessel injury, fracture, infection, dislocation, DVT, implant failure, skin and soft tissue issues, and leg length discrepancy.  We talked about our goals being decreased pain, improved mobility, and overall improved quality of  life.  DESCRIPTION OF PROCEDURE:  After informed consent was obtained, appropriate right  hip was marked.  She was brought to the operating room and sat up on a stretcher where spinal anesthesia was obtained. She was laid in supine position on the stretcher.   Foley catheter was placed.  Traction boots were placed on both her feet.  Next, she was placed supine on the Hana fracture table, perineal post in place and both legs in line skeletal traction device and no traction applied.  Her right operative hip was  prepped and draped with DuraPrep and sterile drapes.  A timeout was called.  She was identified as correct patient, correct right hip.  We then made an incision just inferior and posterior to the anterior superior iliac spine and carried this obliquely  down the leg.  We dissected down tensor fascia lata muscle.  Tensor fascia was then divided longitudinally to proceed with direct anterior approach to the hip.  We identified and cauterized circumflex vessels, then identified the hip capsule, opened the  hip capsule in L type format finding moderate joint effusion and significant arthritis around the femoral head and neck.  Again, the femoral head was in more protrusio type of position.  We opened up the joint capsule again and placed Cobra traction on  the medial and lateral femoral neck and made our femoral neck cut with oscillating saw just proximal to the lesser trochanter and completed this with an osteotome, placed a corkscrew guide in the femoral head and removed the femoral head in its entirety  and found a wide area devoid of cartilage.  We had to remove periarticular osteophytes from all around the rim of the  acetabulum and remnants of acetabular labrum and other debris.  I placed a bent Hohmann over the medial acetabular rim and then began  reaming under direct visualization from a size 43 reamer in stepwise increments going up to a size 49 with all reamers placed under direct visualization with the last reamer also placed under direct fluoroscopy, so I can obtain my depth of  reaming, my  inclination and anteversion.  I then placed the real DePuy Sector Gription acetabular component size 50 with a 32+0 neutral polyethylene liner based off her offset where we were changing her anatomy bringing the cuff back out.  We then turned attention  to the femur.  The leg was externally rotated to 120 degrees, extended and adducted. We were able to place a Mueller retractor medially and a Hohmann retractor behind the greater trochanter.  We released lateral joint capsule and used a box cutting  osteotome to enter the femoral canal and a rongeur to lateralize.  We then began broaching using Corail broaching system going from a size 8 just up to a size 10.  With size 10 in place, we trialed standard offset femoral neck and a 32+1 hip ball reduced  this in acetabulum and it was stable on exam, but definitely longer.  I dislocated the hip.  We tried to seat the stem deeper.  I was not comfortable with going with a varus offset femoral neck because we had already increased her offset and was not  comfortable with going with a shorter neck based on the possibility of impingement.  We decided to go with a standard offset with a size 10 stem.  We removed the trial instrumentation, placed the real Corail femoral component with standard offset size 10  with a 32+1 hip ball, reduced this in the acetabulum, and it was very stable, but we definitely increased her leg length and offset.  We then irrigated the soft tissue with normal saline solution using pulsatile lavage.  There was only minimal part of  the joint capsule I closed with #1 Ethibond suture.  The tensor fascia was closed with #1 Vicryl. 0 Vicryl was used to close the deep tissue and 2-0 Vicryl was used to close subcutaneous tissue.  The skin was reapproximated with staples.  An Aquacel  dressing was applied.  She was taken off Hana table and taken to recovery room in stable condition with all final counts being correct.  No complications  noted.   Of note, Rexene Edison, PA-C, did assist during the entire case.  His assistance was crucial for facilitating all aspects of this case.   ROH D: 06/23/2020 2:55:19 pm T: 06/23/2020 4:00:00 pm  JOB: [id number]/ [id number]

## 2020-06-23 NOTE — Anesthesia Postprocedure Evaluation (Signed)
Anesthesia Post Note  Patient: Furniture conservator/restorer  Procedure(s) Performed: RIGHT TOTAL HIP ARTHROPLASTY ANTERIOR APPROACH (Right: Hip)     Patient location during evaluation: PACU Anesthesia Type: MAC and Spinal Level of consciousness: oriented and awake and alert Pain management: pain level controlled Vital Signs Assessment: post-procedure vital signs reviewed and stable Respiratory status: spontaneous breathing, respiratory function stable and patient connected to nasal cannula oxygen Cardiovascular status: blood pressure returned to baseline and stable Postop Assessment: no headache, no backache and no apparent nausea or vomiting Anesthetic complications: no   No notable events documented.  Last Vitals:  Vitals:   06/23/20 1659 06/23/20 1724  BP: (!) 101/52 (!) 90/50  Pulse: 79 71  Resp: 16 13  Temp:  (!) 36.1 C  SpO2: 100% 100%    Last Pain:  Vitals:   06/23/20 1659  TempSrc:   PainSc: [place] Mintie Wither[place]

## 2020-06-23 NOTE — H&P (Signed)
TOTAL HIP ADMISSION H&P  Patient is admitted for right total hip arthroplasty.  Subjective:  Chief Complaint: right hip pain  HPI: Norma Clay, 70 y.o. female, has a history of pain and functional disability in the right hip(s) due to arthritis and patient has failed non-surgical conservative treatments for greater than 12 weeks to include NSAID's and/or analgesics, corticosteriod injections, flexibility and strengthening excercises, use of assistive devices, and activity modification.  Onset of symptoms was gradual starting 2 years ago with gradually worsening course since that time.The patient noted no past surgery on the right hip(s).  Patient currently rates pain in the right hip at 9 out of 10 with activity. Patient has night pain, worsening of pain with activity and weight bearing, pain that interfers with activities of daily living, and pain with passive range of motion. Patient has evidence of subchondral sclerosis, periarticular osteophytes, and joint space narrowing by imaging studies. This condition presents safety issues increasing the risk of falls.  There is no current active infection.  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, severe stage 04/23/2018   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, moderate stage 04/23/2018   Osteoarthritis of right hip  05/16/2016   Type 2 diabetes mellitus without complication, without long-term current use of insulin (HCC) 04/18/2016   Past Medical History:  Diagnosis Date   Diabetes mellitus without complication (HCC)    Glaucoma    Hypertension    Posterior capsular opacification of both eyes, 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    Past Surgical History:  Procedure Laterality Date   ABDOMINAL HYSTERECTOMY     GLAUCOMA SURGERY     THYROIDECTOMY      Current Facility-Administered Medications  Medication Dose Route Frequency Provider Last Rate Last Admin   clindamycin (CLEOCIN) 900 MG/50ML IVPB            clindamycin (CLEOCIN) IVPB 900 mg  900 mg Intravenous On Call to OR Kirtland Bouchard, PA-C       lactated ringers infusion   Intravenous Continuous Kipp Brood, MD       povidone-iodine 10 % swab 2 application  2 application Topical Once Kirtland Bouchard, PA-C       tranexamic acid (CYKLOKAPRON) 1000MG /176mL IVPB            tranexamic acid (CYKLOKAPRON) IVPB 1,000 mg  1,000 mg Intravenous To OR 80m W, PA-C       vancomycin (VANCOCIN) 1-5 GM/200ML-% IVPB            vancomycin (VANCOCIN) IVPB 1000 mg/200 mL premix  1,000 mg Intravenous Once M, MD       Allergies  Allergen Reactions   Penicillins Rash    Patient was placed on a form of penicillin by her dentist and developed a rash therefore medication was changed to clindamycin.  Per son, patient is not allergic to clindamycin    Social History   Tobacco  Use   Smoking status: Never   Smokeless tobacco: Never  Substance Use Topics   Alcohol use: No    Family History  Problem Relation Age of Onset   Diabetes Mother    Colon cancer Neg Hx    Stomach cancer Neg Hx    Esophageal cancer Neg Hx      Review of Systems  Musculoskeletal:  Positive for gait problem.  All other systems reviewed and are negative.  Objective:  Physical Exam Vitals reviewed.  Constitutional:      Appearance: Normal appearance.   HENT:     Head: Normocephalic and atraumatic.  Cardiovascular:     Rate and Rhythm: Normal rate and regular rhythm.  Pulmonary:     Effort: Pulmonary effort is normal.  Abdominal:     Palpations: Abdomen is soft.  Musculoskeletal:     Cervical back: Normal range of motion and neck supple.     Right hip: Tenderness and bony tenderness present. Decreased range of motion. Decreased strength.  Neurological:     Mental Status: She is alert and oriented to person, place, and time.  Psychiatric:        Behavior: Behavior normal.    Vital signs in last 24 hours: Temp:  [97.6 F (36.4 C)] 97.6 F (36.4 C) (06/21 1049) Pulse Rate:  [78] 78 (06/21 1049) Resp:  [18] 18 (06/21 1049) BP: (146)/(64) 146/64 (06/21 1049) SpO2:  [100 %] 100 % (06/21 1049) Weight:  [67 kg] 67 kg (06/21 1049)  Labs:   Estimated body mass index is 23.84 kg/m as calculated from the following:   Height as of this encounter: 5\' 6"  (1.676 m).   Weight as of this encounter: 67 kg.   Imaging Review Plain radiographs demonstrate severe degenerative joint disease of the right hip(s). The bone quality appears to be good for age and reported activity level.      Assessment/Plan:  End stage arthritis, right hip(s)  The patient history, physical examination, clinical judgement of the provider and imaging studies are consistent with end stage degenerative joint disease of the right hip(s) and total hip arthroplasty is deemed medically necessary. The treatment options including medical management, injection therapy, arthroscopy and arthroplasty were discussed at length. The risks and benefits of total hip arthroplasty were presented and reviewed. The risks due to aseptic loosening, infection, stiffness, dislocation/subluxation,  thromboembolic complications and other imponderables were discussed.  The patient acknowledged the explanation, agreed to proceed with the plan and consent was signed. Patient is being admitted  for inpatient treatment for surgery, pain control, PT, OT, prophylactic antibiotics, VTE prophylaxis, progressive ambulation and ADL's and discharge planning.The patient is planning to be discharged home with home health services

## 2020-06-23 NOTE — Progress Notes (Signed)
PT Cancellation Note  Patient Details Name: Teandra Harlan MRN: [id number] DOB: [birth year]   Cancelled Treatment:    Reason Eval/Treat Not Completed: Other (comment).  Pt had surgery today with anesthesia finish around 1516.  Pt not in room at this time.  Will f/u as able.   Anise Salvo, PT Acute Rehab Services Pager [phone] [facility] [phone]    Rayetta Humphrey 06/23/2020, 4:59 PM

## 2020-06-23 NOTE — Transfer of Care (Signed)
Immediate Anesthesia Transfer of Care Note  Patient: Norma Clay  Procedure(s) Performed: RIGHT TOTAL HIP ARTHROPLASTY ANTERIOR APPROACH (Right: Hip)  Patient Location: PACU  Anesthesia Type:MAC and Spinal  Level of Consciousness: awake and alert   Airway & Oxygen Therapy: Patient Spontanous Breathing and Patient connected to nasal cannula oxygen  Post-op Assessment: Report given to RN and Post -op Vital signs reviewed and stable  Post vital signs: Reviewed and stable  Last Vitals:  Vitals Value Taken Time  BP    Temp    Pulse 58 06/23/20 1515  Resp 12 06/23/20 1515  SpO2 100 % 06/23/20 1515  Vitals shown include unvalidated device data.  Last Pain:  Vitals:   06/23/20 1049  TempSrc: Oral  PainSc:          Complications: No notable events documented.

## 2020-06-23 NOTE — Anesthesia Preprocedure Evaluation (Addendum)
Anesthesia Evaluation  Patient identified by MRN, date of birth, ID band Patient awake    Reviewed: Allergy & Precautions, NPO status , Patient's Chart, lab work & pertinent test results  Airway Mallampati: II  TM Distance: >3 FB Neck ROM: Full    Dental  (+) Edentulous Upper, Edentulous Lower Metal posts upper and lower:   Pulmonary neg pulmonary ROS,    Pulmonary exam normal breath sounds clear to auscultation       Cardiovascular hypertension, Pt. on medications Normal cardiovascular exam Rhythm:Regular Rate:Normal  Echo 04/2020: 1. Left ventricular ejection fraction by 3D volume is 66 %. The left  ventricle has normal function. The left ventricle has no regional wall  motion abnormalities. Left ventricular diastolic parameters were normal.  2. Right ventricular systolic function is normal. The right ventricular  size is normal.  3. The mitral valve is grossly normal. Trivial mitral valve  regurgitation.  4. The aortic valve is normal in structure. Aortic valve regurgitation is  not visualized.    Neuro/Psych negative neurological ROS  negative psych ROS   GI/Hepatic negative GI ROS, Neg liver ROS,   Endo/Other  diabetes, Well Controlled, Type 2, Oral Hypoglycemic AgentsHypothyroidism a1c 7.4  Renal/GU negative Renal ROS  negative genitourinary   Musculoskeletal  (+) Arthritis , Osteoarthritis,  OA right hip   Abdominal   Peds  Hematology negative hematology ROS (+) hct 36.2, plt 213   Anesthesia Other Findings   Reproductive/Obstetrics negative OB ROS                            Anesthesia Physical Anesthesia Plan  ASA: 2  Anesthesia Plan: MAC and Spinal   Post-op Pain Management:    Induction:   PONV Risk Score and Plan: 2 and Propofol infusion, TIVA and Treatment may vary due to age or medical condition  Airway Management Planned: Natural Airway and Simple Face  Mask  Additional Equipment: None  Intra-op Plan:   Post-operative Plan: Extubation in OR  Informed Consent: I have reviewed the patients History and Physical, chart, labs and discussed the procedure including the risks, benefits and alternatives for the proposed anesthesia with the patient or authorized representative who has indicated his/her understanding and acceptance.     Dental advisory given  Plan Discussed with: CRNA  Anesthesia Plan Comments:        Anesthesia Quick Evaluation

## 2020-06-24 ENCOUNTER — Encounter (HOSPITAL_COMMUNITY): Payer: Self-pay | Admitting: Orthopaedic Surgery

## 2020-06-24 ENCOUNTER — Other Ambulatory Visit: Payer: Self-pay

## 2020-06-24 DIAGNOSIS — I10 Essential (primary) hypertension: Secondary | ICD-10-CM | POA: Diagnosis not present

## 2020-06-24 DIAGNOSIS — H401410 Capsular glaucoma with pseudoexfoliation of lens, right eye, stage unspecified: Secondary | ICD-10-CM | POA: Diagnosis not present

## 2020-06-24 DIAGNOSIS — H401420 Capsular glaucoma with pseudoexfoliation of lens, left eye, stage unspecified: Secondary | ICD-10-CM | POA: Diagnosis not present

## 2020-06-24 DIAGNOSIS — Z833 Family history of diabetes mellitus: Secondary | ICD-10-CM | POA: Diagnosis not present

## 2020-06-24 DIAGNOSIS — Z90710 Acquired absence of both cervix and uterus: Secondary | ICD-10-CM | POA: Diagnosis not present

## 2020-06-24 DIAGNOSIS — E1140 Type 2 diabetes mellitus with diabetic neuropathy, unspecified: Secondary | ICD-10-CM | POA: Diagnosis not present

## 2020-06-24 DIAGNOSIS — Z9181 History of falling: Secondary | ICD-10-CM | POA: Diagnosis not present

## 2020-06-24 DIAGNOSIS — E890 Postprocedural hypothyroidism: Secondary | ICD-10-CM | POA: Diagnosis not present

## 2020-06-24 DIAGNOSIS — M1611 Unilateral primary osteoarthritis, right hip: Secondary | ICD-10-CM | POA: Diagnosis present

## 2020-06-24 DIAGNOSIS — E1169 Type 2 diabetes mellitus with other specified complication: Secondary | ICD-10-CM | POA: Diagnosis not present

## 2020-06-24 DIAGNOSIS — E785 Hyperlipidemia, unspecified: Secondary | ICD-10-CM | POA: Diagnosis not present

## 2020-06-24 DIAGNOSIS — E892 Postprocedural hypoparathyroidism: Secondary | ICD-10-CM | POA: Diagnosis not present

## 2020-06-24 DIAGNOSIS — M25751 Osteophyte, right hip: Secondary | ICD-10-CM | POA: Diagnosis not present

## 2020-06-24 DIAGNOSIS — Z880 Allergy status to penicillin: Secondary | ICD-10-CM | POA: Diagnosis not present

## 2020-06-24 LAB — BASIC METABOLIC PANEL
Anion gap: 11 (ref 5–15)
BUN: 22 mg/dL (ref 8–23)
CO2: 23 mmol/L (ref 22–32)
Calcium: 8 mg/dL — ABNORMAL LOW (ref 8.9–10.3)
Chloride: 101 mmol/L (ref 98–111)
Creatinine, Ser: 0.83 mg/dL (ref 0.44–1.00)
GFR, Estimated: >60 mL/min (ref >60)
Glucose, Bld: 117 mg/dL — ABNORMAL HIGH (ref 70–99)
Potassium: 3.8 mmol/L (ref 3.5–5.1)
Sodium: 135 mmol/L (ref 135–145)

## 2020-06-24 LAB — CBC
HCT: 31.7 % — ABNORMAL LOW (ref 36.0–46.0)
Hemoglobin: 10.4 g/dL — ABNORMAL LOW (ref 12.0–15.0)
MCH: 30.3 pg (ref 26.0–34.0)
MCHC: 32.8 g/dL (ref 30.0–36.0)
MCV: 92.4 fL (ref 80.0–100.0)
Platelets: 183 10*3/uL (ref 150–400)
RBC: 3.43 MIL/uL — ABNORMAL LOW (ref 3.87–5.11)
RDW: 14 % (ref 11.5–15.5)
WBC: 9 10*3/uL (ref 4.0–10.5)
nRBC: 0 % (ref 0.0–0.2)

## 2020-06-24 MED FILL — Empagliflozin Tab 10 MG: ORAL | 30 days supply | Qty: 30 | Fill #2 | Status: CN

## 2020-06-24 MED FILL — Empagliflozin Tab 10 MG: ORAL | 30 days supply | Qty: 30 | Fill #2 | Status: AC

## 2020-06-24 NOTE — TOC Initial Note (Signed)
Transition of Care [facility]) - Initial/Assessment Note    Patient Details  Name: Norma Clay MRN: [id number] Date of Birth: 1950/08/04  Transition of Care [facility]) CM/SW Contact:    Epifanio Lesches, RN Phone Number: 06/24/2020, 4:24 PM  Clinical Narrative:                    -s/p Right total hip arthroplasty,6/21 From home with son Hussein/ family. PTA independent with ADL's,no DME usage. NCM spoke with pt's son regarding d/c planning. Pt doesn't speak English. Speaks Arabic. Per son who speaks [place], son states pt is  agreeable to home health services. Choice offered.Pt with preference for home health agency. Referral made with [facility] for HHPT services and  accepted.  TOC following and will continue to assist with needs....   Expected Discharge Plan: Home w Home Health Services Barriers to Discharge: Continued Medical Work up   Patient Goals and CMS Choice     Choice offered to / list presented to : Adult Children  Expected Discharge Plan and Services Expected Discharge Plan: Home w Home Health Services   Discharge Planning Services: CM Consult   Living arrangements for the past 2 months: Single Family Home                     HH Arranged: PT   Date HH Agency Contacted: 06/24/20 Time HH Agency Contacted: 1623 Representative spoke with at [facility] Agency: Kandee Keen  Prior Living Arrangements/Services Living arrangements for the past 2 months: Single Family Home Lives with:: Adult Children Patient language and need for interpreter reviewed:: Yes        Need for Family Participation in Patient Care: Yes (Comment) Care giver support system in place?: Yes (comment)   Criminal Activity/Legal Involvement Pertinent to Current Situation/Hospitalization: No - Comment as needed  Activities of Daily Living Home Assistive Devices/Equipment: Cane (specify quad or straight), Eyeglasses, CBG Meter, Blood pressure cuff, Dentures (specify type) ADL Screening (condition at time of  admission) Patient's cognitive ability adequate to safely complete daily activities?: Yes Is the patient deaf or have difficulty hearing?: No Does the patient have difficulty seeing, even when wearing glasses/contacts?: No Does the patient have difficulty concentrating, remembering, or making decisions?: No Patient able to express need for assistance with ADLs?: Yes Does the patient have difficulty dressing or bathing?: No Independently performs ADLs?: Yes (appropriate for developmental age) Does the patient have difficulty walking or climbing stairs?: No Weakness of Legs: Right Weakness of Arms/Hands: None  Permission Sought/Granted                  Emotional Assessment       Orientation: : Oriented to Place, Oriented to Self, Oriented to  Time, Oriented to Situation Alcohol / Substance Use: Not Applicable Psych Involvement: No (comment)  Admission diagnosis:  Status post total replacement of right hip [Z96.641] Patient Active Problem List   Diagnosis Date Noted   Status post total replacement of right hip 06/23/2020   Primary osteoarthritis of right hip 05/06/2020   Diabetic neuropathy (HCC) 04/13/2020   Colon cancer screening 12/16/2019   Acquired hypothyroidism 12/16/2019   Hyperlipidemia associated with type 2 diabetes mellitus (HCC) 12/16/2019   Hammer toes of both feet 12/16/2019   Degenerative arthritis of lumbar spine 12/16/2019   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   Essential hypertension 02/19/2019   Postsurgical states following surgery of eye and adnexa 10/01/2018   Pseudophakia of both eyes 04/23/2018   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 [place] [address] Phone: [phone] Fax: [phone]  [id number] [place], [place] - [address] AT [facility] [address] [place] [address] Phone: [phone] Fax: [phone]     Social Determinants of Health (SDOH) Interventions    Readmission Risk Interventions No flowsheet data found.

## 2020-06-24 NOTE — Progress Notes (Signed)
Subjective: 1 Day Post-Op Procedure(s) (LRB): RIGHT TOTAL HIP ARTHROPLASTY ANTERIOR APPROACH (Right) Patient reports pain as moderate.  Son at the bedside to interpret.  Vitals and labs stable.  Objective: Vital signs in last 24 hours: Temp:  [97 F (36.1 C)-98.7 F (37.1 C)] 98.2 F (36.8 C) (06/22 0300) Pulse Rate:  [52-84] 82 (06/22 0300) Resp:  [11-19] 17 (06/22 0300) BP: (74-146)/(49-64) 122/61 (06/22 0300) SpO2:  [97 %-100 %] 97 % (06/22 0300) Weight:  [67 kg] 67 kg (06/21 1049)  Intake/Output from previous day: 06/21 0701 - 06/22 0700 In: 850 [I.V.:700; IV Piggyback:150] Out: 1700 [Urine:1500; Blood:200] Intake/Output this shift: No intake/output data recorded.  Recent Labs    06/22/20 1030 06/24/20 0354  HGB 11.9* 10.4*   Recent Labs    06/22/20 1030 06/24/20 0354  WBC 6.1 9.0  RBC 3.85* 3.43*  HCT 36.2 31.7*  PLT 213 183   Recent Labs    06/22/20 1030 06/24/20 0354  NA 137 135  K 4.0 3.8  CL 103 101  CO2 27 23  BUN 22 22  CREATININE 0.88 0.83  GLUCOSE 101* 117*  CALCIUM 8.8* 8.0*   No results for input(s): LABPT, INR in the last 72 hours.  Sensation intact distally Intact pulses distally Dorsiflexion/Plantar flexion intact Incision: scant drainage   Assessment/Plan: 1 Day Post-Op Procedure(s) (LRB): RIGHT TOTAL HIP ARTHROPLASTY ANTERIOR APPROACH (Right) Up with therapy Discharge home with home health when cleared by therapy.      Kathryne Hitch 06/24/2020, 7:41 AM

## 2020-06-24 NOTE — Anesthesia Procedure Notes (Signed)
Spinal  Patient location during procedure: OR Start time: 06/23/2020 1:35 PM End time: 06/23/2020 1:43 PM Reason for block: surgical anesthesia Staffing Performed: anesthesiologist  Anesthesiologist: Lannie Fields, DO Preanesthetic Checklist Completed: patient identified, IV checked, risks and benefits discussed, surgical consent, monitors and equipment checked, pre-op evaluation and timeout performed Spinal Block Patient position: sitting Prep: DuraPrep and site prepped and draped Patient monitoring: cardiac monitor, continuous pulse ox and blood pressure Approach: midline Location: L3-4 Injection technique: single-shot Needle Needle type: Sprotte  Needle gauge: 22 G Needle length: 9 cm Assessment Sensory level: T6 Events: CSF return Additional Notes Functioning IV was confirmed and monitors were applied. Sterile prep and drape, including hand hygiene and sterile gloves were used. The patient was positioned and the spine was prepped. The skin was anesthetized with lidocaine.  Free flow of clear CSF was obtained prior to injecting local anesthetic into the CSF.  The spinal needle aspirated freely following injection.  The needle was carefully withdrawn.  The patient tolerated the procedure well.

## 2020-06-24 NOTE — Addendum Note (Signed)
Addendum  created 06/24/20 1531 by Lannie Fields, DO   Child order released for a procedure order, Clinical Note Signed, Intraprocedure Blocks edited, SmartForm saved

## 2020-06-24 NOTE — Progress Notes (Signed)
Physical Therapy Treatment Patient Details Name: Norma Clay MRN: [id number] DOB: [birth year] Today's Date: 06/24/2020    History of Present Illness Pt is a 69 y.o. female who presented 6/21 with a history of pain and functional disability in the right hip due to arthritis and patient has failed non-surgical conservative treatments for greater than 12 weeks to include NSAID's and/or analgesics, corticosteriod injections, flexibility and strengthening excercises, use of assistive devices, and activity modification. 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 PMH: DM, glaucoma, HTN.    PT Comments    Pt ambulating an increased distance of up to ~225 ft with min guard utilizing a RW. Pt with improved L step length and thus R stance time this session, but needing continual cues to improve her posture. Performed standing exercises to further address her deficits in R lower extremity ROM and strength, see Exercises below. Will continue to follow acutely. Current recommendations remain appropriate.    Follow Up Recommendations  Home health PT;Supervision for mobility/OOB     Equipment Recommendations  Rolling walker with 5" wheels    Recommendations for Other Services       Precautions / Restrictions Precautions Precautions: Fall;Anterior Hip Restrictions Weight Bearing Restrictions: Yes RLE Weight Bearing: Weight bearing as tolerated    Mobility  Bed Mobility Overal bed mobility: Needs Assistance Bed Mobility: Sit to Supine       Sit to supine: Min assist   General bed mobility comments: MinA to lift R leg onto bed.    Transfers Overall transfer level: Needs assistance Equipment used: Rolling walker (2 wheeled) Transfers: Sit to/from Stand Sit to Stand: Min guard         General transfer comment: Increased time to power up to stand, min guard for safety.  Ambulation/Gait Ambulation/Gait assistance: Min guard Gait Distance (Feet): 225 Feet Assistive device:  Rolling walker (2 wheeled) Gait Pattern/deviations: Step-through pattern;Decreased step length - left;Decreased stance time - right;Decreased stride length;Decreased dorsiflexion - right;Decreased dorsiflexion - left;Decreased weight shift to right;Antalgic;Trunk flexed Gait velocity: reduced Gait velocity interpretation: <1.8 ft/sec, indicate of risk for recurrent falls General Gait Details: Pt with poor posture, ambulating with a flexed trunk, needing repeated cues to momentarily correct. Pt with antalgic gait pattern with decreased R weight shift and stance time and thus decreased L step length. Cues provided to increase L step length, mod success. No overt LOB, but decreased gait speed, min guard for safety. Pt left her RW once approaching her bed at end of session. Educated pt on need to keep RW proximal even when approaching target sitting surface to ensure her safety.   Stairs             Wheelchair Mobility    Modified Rankin (Stroke Patients Only)       Balance Overall balance assessment: Needs assistance Sitting-balance support: No upper extremity supported;Feet supported Sitting balance-Leahy Scale: Fair Sitting balance - Comments: Static sitting EOB no LOB, supervision for safety.   Standing balance support: No upper extremity supported;Bilateral upper extremity supported;During functional activity;Single extremity supported Standing balance-Leahy Scale: Fair Standing balance comment: Able to stand statically without UE support but needs at least 1 UE support for mobility, min guard.                            Cognition Arousal/Alertness: Awake/alert Behavior During Therapy: WFL for tasks assessed/performed Overall Cognitive Status: Within Functional Limits for tasks assessed  General Comments: Pt with difficulty comprehending education on need to keep RW proximal to her. repeated education provided that the RN  was already aware and will be addressing her reports of difficulty with urinating.      Exercises General Exercises - Lower Extremity Hip Flexion/Marching: Standing;10 reps;Right;AROM (with RW) Heel Raises: Strengthening;Both;10 reps;Standing (with RW) Mini-Sqauts: Strengthening;Both;10 reps;Standing (without UE support) Other Exercises Other Exercises: Standing hamstring curls on R, AROM, 10x, UEs on RW    General Comments General comments (skin integrity, edema, etc.): Translator Norma Clay [phone] utilized during session      Pertinent Vitals/Pain Pain Assessment: Faces Faces Pain Scale: Hurts even more Pain Location: R hip Pain Descriptors / Indicators: Grimacing;Guarding;Discomfort Pain Intervention(s): Limited activity within patient's tolerance;Monitored during session;Repositioned    Home Living                      Prior Function            PT Goals (current goals can now be found in the care plan section) Acute Rehab PT Goals Patient Stated Goal: to urinate PT Goal Formulation: With patient/family Time For Goal Achievement: 06/29/20 Potential to Achieve Goals: Good Progress towards PT goals: Progressing toward goals    Frequency    7X/week (attempt to see BID)      PT Plan Current plan remains appropriate    Co-evaluation              AM-PAC PT "6 Clicks" Mobility   Outcome Measure  Help needed turning from your back to your side while in a flat bed without using bedrails?: A Little Help needed moving from lying on your back to sitting on the side of a flat bed without using bedrails?: A Little Help needed moving to and from a bed to a chair (including a wheelchair)?: A Little Help needed standing up from a chair using your arms (e.g., wheelchair or bedside chair)?: A Little Help needed to walk in hospital room?: A Little Help needed climbing 3-5 steps with a railing? : A Little 6 Click Score: 18    End of Session Equipment Utilized  During Treatment: Gait belt Activity Tolerance: Patient tolerated treatment well Patient left: with call bell/phone within reach;in bed;with bed alarm set Nurse Communication: Mobility status;Other (comment) (difficulty urinating) PT Visit Diagnosis: Unsteadiness on feet (R26.81);Other abnormalities of gait and mobility (R26.89);Muscle weakness (generalized) (M62.81);Difficulty in walking, not elsewhere classified (R26.2);Pain Pain - Right/Left: Right Pain - part of body: Hip     Time: 0347-4259 PT Time Calculation (min) (ACUTE ONLY): 30 min  Charges:  $Gait Training: 8-22 mins $Therapeutic Exercise: 8-22 mins                     Raymond Gurney, PT, DPT Acute Rehabilitation Services  Pager: [phone] Office: [phone]    Norma Clay 06/24/2020, 4:33 PM

## 2020-06-24 NOTE — Discharge Instructions (Signed)

## 2020-06-24 NOTE — Evaluation (Signed)
Physical Therapy Evaluation Patient Details Name: Norma Clay MRN: [id number] DOB: Feb 21, 1950 Today's Date: 06/24/2020   History of Present Illness  Pt is a 70 y.o. female who presented 6/21 with a history of pain and functional disability in the right hip due to arthritis and patient has failed non-surgical conservative treatments for greater than 12 weeks to include NSAID's and/or analgesics, corticosteriod injections, flexibility and strengthening excercises, use of assistive devices, and activity modification. 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 PMH: DM, glaucoma, HTN.   Clinical Impression  Pt presents with condition above and deficits mentioned below, see PT Problem List. PTA, she was mod I with mobility using a quad-cane. Pt lives with her adult son and his family in a 2-level townhouse, in which the showers are on the 2nd story but her bed is on the main floor. Currently, pt demonstrates pain in her R lower extremity along with decreased R lower extremity strength and coordination that are impacting her balance and independence with mobility. She is currently requiring min guard assist for transfers, household distance gait on even surfaces with a RW, and stairs with 1-2 UE support. Educated pt and her son on appropriate sequencing of feet with stairs, safe guarding of the pt with mobility, car transfers, and sit > supine transitions. Pt would benefit from further acute PT and follow-up HHPT services to further progress her independence and safety with all functional mobility. Will continue to follow acutely. Pt's son translated during session.    Follow Up Recommendations Home health PT;Supervision for mobility/OOB    Equipment Recommendations  Rolling walker with 5" wheels    Recommendations for Other Services OT consult     Precautions / Restrictions Precautions Precautions: Fall;Anterior Hip Restrictions Weight Bearing Restrictions: Yes RLE Weight Bearing: Weight  bearing as tolerated      Mobility  Bed Mobility Overal bed mobility: Needs Assistance Bed Mobility: Supine to Sit     Supine to sit: Min assist     General bed mobility comments: Bed flat and rails down, cuing pt and her son to allow pt to attempt without physical assistance first but son wanting to help, providing likely around minA to ascend her trunk initially. Pt then able to scoot to EOB without physical assistance.    Transfers Overall transfer level: Needs assistance Equipment used: Rolling walker (2 wheeled) Transfers: Sit to/from Stand Sit to Stand: Min guard         General transfer comment: Increased time to power up to stand, min guard for safety, 3x during session, 1x from EOB, 1x from recliner, and 1x from toilet with use of wall grab bar.  Ambulation/Gait Ambulation/Gait assistance: Min guard Gait Distance (Feet): 180 Feet (x3 bouts of ~30 ft > ~20 ft > ~180 ft) Assistive device: Rolling walker (2 wheeled) Gait Pattern/deviations: Step-through pattern;Decreased step length - left;Decreased stance time - right;Decreased stride length;Decreased dorsiflexion - right;Decreased dorsiflexion - left;Decreased weight shift to right;Antalgic;Trunk flexed Gait velocity: reduced Gait velocity interpretation: <1.31 ft/sec, indicative of household ambulator General Gait Details: Pt with poor posture, ambulating with a flexed trunk, needing repeated cues to momentarily correct. Pt with antalgic gait pattern with decreased R weight shift and stance time and thus decreased L step length. No overt LOB, but decreased gait speed, min guard for safety.  Stairs Stairs: Yes Stairs assistance: Min guard Stair Management: One rail Right;One rail Left;Step to pattern;Forwards;Sideways Number of Stairs: 6 General stair comments: Ascends forward with use of R rail, leading up  with L leg as cued. Descends sideways with bil hands on L handrail, leading down with R leg as cued. Increased  time, no LOB, min guard for safety.  Wheelchair Mobility    Modified Rankin (Stroke Patients Only)       Balance Overall balance assessment: Needs assistance Sitting-balance support: No upper extremity supported;Feet supported Sitting balance-Leahy Scale: Fair Sitting balance - Comments: Static sitting EOB no LOB, supervision for safety.   Standing balance support: No upper extremity supported;Bilateral upper extremity supported;During functional activity;Single extremity supported Standing balance-Leahy Scale: Fair Standing balance comment: Able to stand statically without UE support but needs at least 1 UE support for mobility, min guard.                             Pertinent Vitals/Pain Pain Assessment: 0-10 Pain Score: 10-Worst pain ever Pain Location: R hip Pain Descriptors / Indicators: Grimacing;Guarding;Discomfort Pain Intervention(s): Limited activity within patient's tolerance;Monitored during session;Premedicated before session;Repositioned    Home Living Family/patient expects to be discharged to:: Private residence Living Arrangements: Children;Other relatives (adult son and his wife and children) Available Help at Discharge: Family;Available 24 hours/day Type of Home: House (townhouse) Home Access: Stairs to enter Entrance Stairs-Rails: None Entrance Stairs-Number of Steps: 1 Home Layout: Two level;Bed/bath upstairs;Able to live on main level with bedroom/bathroom (showers are on 2nd level) Home Equipment: Cane - quad;Shower seat;Other (comment) (arm rests to attach to toilet)      Prior Function Level of Independence: Independent with assistive device(s)         Comments: Mod I with use of quad cane for mobility. Does not cook.     Hand Dominance        Extremity/Trunk Assessment   Upper Extremity Assessment Upper Extremity Assessment: Defer to OT evaluation    Lower Extremity Assessment Lower Extremity Assessment: RLE  deficits/detail RLE Deficits / Details: Generalized weakness, impacted by pain; denies numbness/tingling; maintains hip in internal rotation with mobility, son states this is her baseline RLE Sensation:  (denies numbness/tingling) RLE Coordination: decreased gross motor    Cervical / Trunk Assessment Cervical / Trunk Assessment: Kyphotic  Communication   Communication: Prefers language other than English (Arabic)  Cognition Arousal/Alertness: Awake/alert Behavior During Therapy: WFL for tasks assessed/performed Overall Cognitive Status: Within Functional Limits for tasks assessed                                 General Comments: Pt is a bit impulsive to want to move prior to being cued, but likely baseline. Difficult to assess due to language barrier.      General Comments General comments (skin integrity, edema, etc.): Educated pt to elevate leg and perform ankle pumps; son translated during session; educated pt and her son on backing up to bed or car seat and lifting buttocks to reach to sit then scoot posteriorly and raise legs onto bed or into car with assisatance as needed    Exercises General Exercises - Lower Extremity Ankle Circles/Pumps: Both;10 reps;Seated   Assessment/Plan    PT Assessment Patient needs continued PT services  PT Problem List Decreased strength;Decreased range of motion;Decreased activity tolerance;Decreased balance;Decreased mobility;Decreased coordination;Decreased knowledge of use of DME;Pain       PT Treatment Interventions DME instruction;Gait training;Stair training;Functional mobility training;Therapeutic activities;Therapeutic exercise;Balance training;Neuromuscular re-education;Patient/family education    PT Goals (Current goals can be found in the Care  Plan section)  Acute Rehab PT Goals Patient Stated Goal: to improve and walk PT Goal Formulation: With patient/family Time For Goal Achievement: 06/29/20 Potential to Achieve  Goals: Good    Frequency 7X/week (attempt to see BID)   Barriers to discharge        Co-evaluation               AM-PAC PT "6 Clicks" Mobility  Outcome Measure Help needed turning from your back to your side while in a flat bed without using bedrails?: A Little Help needed moving from lying on your back to sitting on the side of a flat bed without using bedrails?: A Little Help needed moving to and from a bed to a chair (including a wheelchair)?: A Little Help needed standing up from a chair using your arms (e.g., wheelchair or bedside chair)?: A Little Help needed to walk in hospital room?: A Little Help needed climbing 3-5 steps with a railing? : A Little 6 Click Score: 18    End of Session Equipment Utilized During Treatment: Gait belt Activity Tolerance: Patient tolerated treatment well Patient left: in chair;with call bell/phone within reach;with chair alarm set;with family/visitor present Nurse Communication: Mobility status PT Visit Diagnosis: Unsteadiness on feet (R26.81);Other abnormalities of gait and mobility (R26.89);Muscle weakness (generalized) (M62.81);Difficulty in walking, not elsewhere classified (R26.2);Pain Pain - Right/Left: Right Pain - part of body: Hip    Time: 0929-1018 PT Time Calculation (min) (ACUTE ONLY): 49 min   Charges:   PT Evaluation $PT Eval Low Complexity: 1 Low PT Treatments $Gait Training: 8-22 mins $Therapeutic Activity: 8-22 mins        Raymond Gurney, PT, DPT Acute Rehabilitation Services  Pager: [phone] Office: [phone]   Jewel Baize 06/24/2020, 10:36 AM

## 2020-06-25 DIAGNOSIS — M1611 Unilateral primary osteoarthritis, right hip: Secondary | ICD-10-CM | POA: Diagnosis not present

## 2020-06-25 MED ORDER — HYDROCODONE-ACETAMINOPHEN 5-325 MG PO TABS: 1.0000 | ORAL_TABLET | Freq: Four times a day (QID) | ORAL | 0 refills | Status: DC | PRN

## 2020-06-25 MED ORDER — ASPIRIN 81 MG PO CHEW: 81.0000 mg | CHEWABLE_TABLET | Freq: Two times a day (BID) | ORAL | 0 refills | Status: DC

## 2020-06-25 NOTE — Evaluation (Signed)
Occupational Therapy Evaluation Patient Details Name: Norma Clay MRN: [id number] DOB: 08/11/1950 Today's Date: 06/25/2020    History of Present Illness Pt is a 70 y.o. female who presented 6/21 with a history of pain and functional disability in the right hip due to arthritis and patient has failed non-surgical conservative treatments for greater than 12 weeks to include NSAID's and/or analgesics, corticosteriod injections, flexibility and strengthening excercises, use of assistive devices, and activity modification. 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 PMH: DM, glaucoma, HTN.   Clinical Impression   Pt admitted to [facility] for procedure listed above. PTA pt and son reported independence with all ADL's, IADL's were taken care of by her family. Pt has been limited with activity tolerance by pain prior to this surgery. At this time, pt reports that her pain is much more tolerable and she demonstrates steady activity tolerance with ambulating OOB and completing ADL's. Pt and family were educated on all hip precautions, as well as compensatory techniques with ADL's. Acute OT will follow to assist pt with returning to independence.     Follow Up Recommendations  Home health OT;Supervision/Assistance - 24 hour    Equipment Recommendations  3 in 1 bedside commode;Other (comment) (RW)    Recommendations for Other Services       Precautions / Restrictions Precautions Precautions: Fall;Anterior Hip Precaution Booklet Issued: Yes (comment) Precaution Comments: discussed verbally with son and pt Restrictions Weight Bearing Restrictions: Yes RLE Weight Bearing: Weight bearing as tolerated      Mobility Bed Mobility Overal bed mobility: Needs Assistance Bed Mobility: Sit to Supine     Supine to sit: Min assist     General bed mobility comments: Up walking in hall at beginning of session, in recliner at end of session.    Transfers Overall transfer level: Needs  assistance Equipment used: Rolling walker (2 wheeled) Transfers: Sit to/from Stand Sit to Stand: Min guard         General transfer comment: Increased time to power up to stand, min guard for safety.    Balance Overall balance assessment: Needs assistance Sitting-balance support: No upper extremity supported;Feet supported Sitting balance-Leahy Scale: Fair Sitting balance - Comments: Static sitting EOB no LOB, supervision for safety.   Standing balance support: No upper extremity supported;Bilateral upper extremity supported;During functional activity;Single extremity supported Standing balance-Leahy Scale: Fair Standing balance comment: Able to stand statically without UE support but needs at least 1 UE support for mobility, min guard.                           ADL either performed or assessed with clinical judgement   ADL Overall ADL's : Needs assistance/impaired Eating/Feeding: Independent;Sitting   Grooming: Wash/dry hands;Wash/dry face;Standing Grooming Details (indicate cue type and reason): standing at sink, has to do it in small increments Upper Body Bathing: Supervision/ safety;Sitting   Lower Body Bathing: Min guard;Sitting/lateral leans;Sit to/from stand   Upper Body Dressing : Independent;Sitting   Lower Body Dressing: Minimal assistance;Sitting/lateral leans;Sit to/from stand Lower Body Dressing Details (indicate cue type and reason): Pt unable to reach L foot to don and doff socks and shoes. Education provided on AE as well as compensatory techniques. Toilet Transfer: Min guard;Ambulation Toilet Transfer Details (indicate cue type and reason): Needs hand rails to control her descent down to seat Toileting- Clothing Manipulation and Hygiene: Min guard;Sitting/lateral lean;Sit to/from stand   Tub/ Shower Transfer: Min guard;Ambulation   Functional mobility during ADLs: Min  guard;Rolling walker General ADL Comments: Pt tolerating activity well this  session, completed walk with PT, then demonstrated and simulated all ADL's with OT right after. Min guard to min A for all ADL's.     Vision Baseline Vision/History: No visual deficits Patient Visual Report: No change from baseline Vision Assessment?: No apparent visual deficits     Perception Perception Perception Tested?: No   Praxis Praxis Praxis tested?: Not tested    Pertinent Vitals/Pain Pain Assessment: Faces Faces Pain Scale: Hurts little more Pain Location: R hip Pain Descriptors / Indicators: Grimacing;Guarding;Discomfort Pain Intervention(s): Limited activity within patient's tolerance;Monitored during session;Repositioned     Hand Dominance Right   Extremity/Trunk Assessment Upper Extremity Assessment Upper Extremity Assessment: Overall WFL for tasks assessed   Lower Extremity Assessment Lower Extremity Assessment: Defer to PT evaluation   Cervical / Trunk Assessment Cervical / Trunk Assessment: Kyphotic   Communication Communication Communication: Prefers language other than English   Cognition Arousal/Alertness: Awake/alert Behavior During Therapy: WFL for tasks assessed/performed Overall Cognitive Status: Within Functional Limits for tasks assessed                                 General Comments: repeated education provided on posture and positioning of RW, pt son reinforcing education. asking good questions about precautions.   General Comments  Translator Tobi Bastos [phone] used, pt son stating they did not need translator as he could provide translation    Exercises     Shoulder Instructions      Home Living Family/patient expects to be discharged to:: Private residence Living Arrangements: Children;Other relatives Available Help at Discharge: Family;Available 24 hours/day Type of Home: House Home Access: Stairs to enter [employer] of Steps: 1 Entrance Stairs-Rails: None Home Layout: Two level;Bed/bath upstairs;Able to  live on main level with bedroom/bathroom Alternate Level Stairs-Number of Steps: 12 Alternate Level Stairs-Rails: Right Bathroom Shower/Tub: Tub/shower unit;Walk-in shower   Bathroom Toilet: Standard Bathroom Accessibility: Yes How Accessible: Accessible via walker Home Equipment: Cane - quad;Shower seat;Grab bars - toilet          Prior Functioning/Environment Level of Independence: Independent with assistive device(s)        Comments: Mod I with use of quad cane for mobility. Does not cook.        OT Problem List: Decreased strength;Decreased activity tolerance;Impaired balance (sitting and/or standing);Decreased coordination;Decreased safety awareness;Decreased knowledge of use of DME or AE;Decreased knowledge of precautions;Pain      OT Treatment/Interventions: Self-care/ADL training;Therapeutic exercise;Energy conservation;DME and/or AE instruction;Therapeutic activities;Patient/family education;Balance training    OT Goals(Current goals can be found in the care plan section) Acute Rehab OT Goals Patient Stated Goal: to go home OT Goal Formulation: With patient/family Time For Goal Achievement: 07/09/20 Potential to Achieve Goals: Good ADL Goals Additional ADL Goal #1: Pt will complete 3 grooming tasks standing at sink to improve activity tolerance. Additional ADL Goal #2: Pt will recall all hip precautions, 100% of the time. Additional ADL Goal #3: Pt will verbalize 3 fall prevention techniques she can use at home for her safety.  OT Frequency: Min 2X/week   Barriers to D/C:            Co-evaluation              AM-PAC OT "6 Clicks" Daily Activity     Outcome Measure Help from another person eating meals?: None Help from another person taking care of personal grooming?: A  Little Help from another person toileting, which includes using toliet, bedpan, or urinal?: A Little Help from another person bathing (including washing, rinsing, drying)?: A  Little Help from another person to put on and taking off regular upper body clothing?: None Help from another person to put on and taking off regular lower body clothing?: A Little 6 Click Score: 20   End of Session Equipment Utilized During Treatment: Rolling walker;Gait belt Nurse Communication: Mobility status  Activity Tolerance: Patient tolerated treatment well Patient left: in chair;with call bell/phone within reach;with chair alarm set;with family/visitor present  OT Visit Diagnosis: Unsteadiness on feet (R26.81);Other abnormalities of gait and mobility (R26.89);Muscle weakness (generalized) (M62.81)                Time: 6010-9323 OT Time Calculation (min): 28 min Charges:  OT General Charges $OT Visit: 1 Visit OT Evaluation $OT Eval Moderate Complexity: 1 Mod OT Treatments $Self Care/Home Management : 8-22 mins  Paras Kreider H., OTR/L Acute Rehabilitation  Madelyne Millikan Elane Kyona Chauncey 06/25/2020, 12:04 PM

## 2020-06-25 NOTE — Discharge Summary (Signed)
Patient ID: Norma Clay MRN: [id number] DOB/AGE: September 12, 1950 70 y.o.  Admit date: 06/23/2020 Discharge date: 06/25/2020  Admission Diagnoses:  Principal Problem:   Primary osteoarthritis of right hip Active Problems:   Status post total replacement of right hip   Discharge Diagnoses:  Status post right total hip arthroplasty  Past Medical History:  Diagnosis Date   Diabetes mellitus without complication ([place])    Glaucoma    Hypertension    Posterior capsular opacification of both eyes, 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    Surgeries: Procedure(s): RIGHT TOTAL HIP ARTHROPLASTY ANTERIOR APPROACH on 06/23/2020   Consultants:   Discharged Condition: Improved  Hospital Course: Norma Clay is an 70 y.o. female who was admitted 06/23/2020 for operative treatment ofPrimary osteoarthritis of right hip. Patient has severe unremitting pain that affects sleep, daily activities, and work/hobbies. After pre-op clearance the patient was taken to the operating room on 06/23/2020 and underwent  Procedure(s): RIGHT TOTAL HIP ARTHROPLASTY ANTERIOR APPROACH.    Patient was given perioperative antibiotics:  Anti-infectives (From admission, onward)    Start     Dose/Rate Route Frequency Ordered Stop   06/23/20 1515  vancomycin (VANCOREADY) IVPB 1000 mg/200 mL        1,000 mg 200 mL/hr over 60 Minutes Intravenous Every 12 hours 06/23/20 1513 06/24/20 0314   06/23/20 1030  vancomycin (VANCOCIN) IVPB 1000 mg/200 mL premix        1,000 mg 200 mL/hr over 60 Minutes Intravenous  Once 06/23/20 1020 06/23/20 1312   06/23/20 1030  clindamycin (CLEOCIN) IVPB 900 mg        900 mg 100 mL/hr over 30 Minutes Intravenous On call to O.R. 06/23/20 1020 06/23/20 1412   06/23/20 1026  clindamycin (CLEOCIN) 900 MG/50ML IVPB       Note to Pharmacy: Laurita Quint   : cabinet override      06/23/20 1026 06/23/20 1410        Patient was given sequential compression devices, early ambulation, and chemoprophylaxis to  prevent DVT.  Patient benefited maximally from hospital stay and there were no complications.    Recent vital signs: Patient Vitals for the past 24 hrs:  BP Temp Temp src Pulse Resp SpO2  06/24/20 1937 115/65 98.7 F (37.1 C) Oral 78 19 93 %  06/24/20 1529 (!) 149/67 98.3 F (36.8 C) Oral 85 20 95 %  06/24/20 1216 (!) 100/57 97.7 F (36.5 C) Oral 66 20 95 %     Recent laboratory studies:  Recent Labs    06/22/20 1030 06/24/20 0354  WBC 6.1 9.0  HGB 11.9* 10.4*  HCT 36.2 31.7*  PLT 213 183  NA 137 135  K 4.0 3.8  CL 103 101  CO2 27 23  BUN 22 22  CREATININE 0.88 0.83  GLUCOSE 101* 117*  CALCIUM 8.8* 8.0*     Discharge Medications:   Allergies as of 06/25/2020       Reactions   Penicillins Rash   Patient was placed on a form of penicillin by her dentist and developed a rash therefore medication was changed to clindamycin.  Per son, patient is not allergic to clindamycin        Medication List     STOP taking these medications    acetaminophen 325 MG tablet Commonly known as: Tylenol       TAKE these medications    Accu-Chek Guide Me w/Device Kit Use to check TID.   aspirin 81 MG chewable tablet Chew 1 tablet (81 mg  total) by mouth 2 (two) times daily.   atorvastatin 20 MG tablet Commonly known as: LIPITOR Take 1 tablet (20 mg total) by mouth daily.   brimonidine 0.2 % ophthalmic solution Commonly known as: ALPHAGAN Place 1 drop into the right eye 2 (two) times daily   calcium carbonate 1250 (500 Ca) MG chewable tablet Commonly known as: OS-CAL Chew 1 tablet by mouth 3 (three) times daily.   dorzolamide-timolol 22.3-6.8 MG/ML ophthalmic solution Commonly known as: COSOPT Place 1 drop into both eyes 2 (two) times daily   HYDROcodone-acetaminophen 5-325 MG tablet Commonly known as: NORCO/VICODIN Take 1-2 tablets by mouth every 6 (six) hours as needed for moderate pain (pain score 4-6).   levothyroxine 88 MCG tablet Commonly known as:  SYNTHROID Take 1 tablet (88 mcg total) by mouth daily.   Travatan Z 0.004 % Soln ophthalmic solution Generic drug: Travoprost (BAK Free) Place 1 drop into both eyes nightly   Vitamin D3 10 MCG (400 UNIT) tablet Take 1 tablet (400 Units total) by mouth daily.       ASK your doctor about these medications    Accu-Chek Guide test strip Generic drug: glucose blood USE TO CHECK 3 TIMES DAILY   Accu-Chek Softclix Lancets lancets USE AS INSTRUCTED USE TO CHECK 3 TIMES DAILY   amLODipine 5 MG tablet Commonly known as: NORVASC TAKE 1 TABLET (5 MG TOTAL) BY MOUTH DAILY. TO LOWER BLOOD PRESSURE   calcitRIOL 0.25 MCG capsule Commonly known as: ROCALTROL TAKE 2 CAPSULES (0.5 MCG TOTAL) BY MOUTH DAILY.   Jardiance 10 MG Tabs tablet Generic drug: empagliflozin TAKE 1 TABLET (10 MG TOTAL) BY MOUTH DAILY BEFORE BREAKFAST.   losartan 25 MG tablet Commonly known as: COZAAR TAKE 1 TABLET (25 MG TOTAL) BY MOUTH DAILY.   metFORMIN 850 MG tablet Commonly known as: GLUCOPHAGE TAKE 1 TABLET (850 MG TOTAL) BY MOUTH 2 (TWO) TIMES DAILY WITH A MEAL.               Durable Medical Equipment  (From admission, onward)           Start     Ordered   06/23/20 1757  DME 3 n 1  Once        06/23/20 1756   06/23/20 1757  DME Walker rolling  Once       Question Answer Comment  Walker: With 5 Inch Wheels   Patient needs a walker to treat with the following condition Status post total replacement of right hip      06/23/20 1756            Diagnostic Studies: DG Pelvis Portable  Result Date: 06/23/2020 CLINICAL DATA:  Post right hip replacement. EXAM: PORTABLE PELVIS 1-2 VIEWS COMPARISON:  None. FINDINGS: Right hip arthroplasty in expected alignment. No periprosthetic lucency or fracture. Recent postsurgical change includes air and edema in the joint space and soft tissues with lateral skin staples. IMPRESSION: Right hip arthroplasty without immediate postoperative complication.  Electronically Signed   By: Keith Rake M.D.   On: 06/23/2020 15:42   DG C-Arm 1-60 Min  Result Date: 06/23/2020 CLINICAL DATA:  Right hip arthroplasty. EXAM: DG C-ARM 1-60 MIN COMPARISON:  Radiograph 04/02/2020 FINDINGS: Three fluoroscopic spot views of the pelvis and right hip were submitted in frontal projection. Placement of right hip arthroplasty. Fluoroscopy time 20 seconds. Dose 1.62 mGy. IMPRESSION: Procedural fluoroscopy for right hip arthroplasty. Electronically Signed   By: Keith Rake M.D.   On: 06/23/2020 15:12  DG HIP OPERATIVE UNILAT WITH PELVIS RIGHT  Result Date: 06/23/2020 CLINICAL DATA:  Right hip arthroplasty. EXAM: OPERATIVE RIGHT HIP (WITH PELVIS IF PERFORMED) TECHNIQUE: Fluoroscopic spot image(s) were submitted for interpretation post-operatively. COMPARISON:  Radiograph 04/02/2020 FINDINGS: Three fluoroscopic spot views of the pelvis and right hip were submitted in frontal projection. Placement of right hip arthroplasty. Fluoroscopy time 20 seconds. Dose 1.62 mGy. IMPRESSION: Procedural fluoroscopy for right hip arthroplasty. Electronically Signed   By: Keith Rake M.D.   On: 06/23/2020 15:09    Disposition: Discharge disposition: 01-Home or [place]     Mcarthur Rossetti, MD Follow up in 2 week(s).   Specialty: Orthopedic Surgery Contact information: [address] [place] [address] [phone]                  Signed: Erskine Emery 06/25/2020, 8:14 AM

## 2020-06-25 NOTE — Progress Notes (Signed)
Subjective: 2 Days Post-Op Procedure(s) (LRB): RIGHT TOTAL HIP ARTHROPLASTY ANTERIOR APPROACH (Right) Patient reports pain as moderate.  Tolerating diet well. No complaints . Son present in room and interprets  for patient. Voiding and positive BM.   Objective: Vital signs in last 24 hours: Temp:  [97.7 F (36.5 C)-98.7 F (37.1 C)] 98.7 F (37.1 C) (06/22 1937) Pulse Rate:  [66-85] 78 (06/22 1937) Resp:  [16-20] 19 (06/22 1937) BP: (100-149)/(53-67) 115/65 (06/22 1937) SpO2:  [93 %-98 %] 93 % (06/22 1937)  Intake/Output from previous day: 06/22 0701 - 06/23 0700 In: 934.6 [I.V.:934.6] Out: 450 [Urine:450] Intake/Output this shift: No intake/output data recorded.  Recent Labs    06/22/20 1030 06/24/20 0354  HGB 11.9* 10.4*   Recent Labs    06/22/20 1030 06/24/20 0354  WBC 6.1 9.0  RBC 3.85* 3.43*  HCT 36.2 31.7*  PLT 213 183   Recent Labs    06/22/20 1030 06/24/20 0354  NA 137 135  K 4.0 3.8  CL 103 101  CO2 27 23  BUN 22 22  CREATININE 0.88 0.83  GLUCOSE 101* 117*  CALCIUM 8.8* 8.0*   No results for input(s): LABPT, INR in the last 72 hours.  Sensation intact distally Dorsiflexion/Plantar flexion intact Incision: dressing C/D/I Compartment soft   Assessment/Plan: 2 Days Post-Op Procedure(s) (LRB): RIGHT TOTAL HIP ARTHROPLASTY ANTERIOR APPROACH (Right) Up with therapy Discharge to home if patient remains stable and safe from PT standpoint.      Norma Clay 06/25/2020, 8:08 AM

## 2020-06-25 NOTE — Progress Notes (Signed)
Physical Therapy Treatment Patient Details Name: Norma Clay MRN: [id number] DOB: [birth year] Today's Date: 06/25/2020    History of Present Illness Pt is a 70 y.o. female who presented 6/21 with a history of pain and functional disability in the right hip due to arthritis and patient has failed non-surgical conservative treatments for greater than 12 weeks to include NSAID's and/or analgesics, corticosteriod injections, flexibility and strengthening excercises, use of assistive devices, and activity modification. 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 PMH: DM, glaucoma, HTN.    PT Comments    The pt was in bed upon arrival of PT, able to come to sitting EOB with minA provided by her son. Both pt and her son (caregiver) were educated in anterior hip precautions and expressed understanding. The pt was able to demo multiple sit-stand transfers from various surfaces, and demos improved safety and positioning of RW with mobility at this session. All questions regarding mobility and exercise at home were answered this morning. Continue to recommend HHPT to further progress pt strength and endurance to complete stair navigation as well as improved independence with mobility in the home.     Follow Up Recommendations  Home health PT;Supervision for mobility/OOB     Equipment Recommendations  Rolling walker with 5" wheels    Recommendations for Other Services       Precautions / Restrictions Precautions Precautions: Fall;Anterior Hip Precaution Booklet Issued: Yes (comment) Precaution Comments: discussed verbally with son and pt Restrictions Weight Bearing Restrictions: Yes RLE Weight Bearing: Weight bearing as tolerated    Mobility  Bed Mobility Overal bed mobility: Needs Assistance Bed Mobility: Sit to Supine     Supine to sit: Min assist     General bed mobility comments: minA to move RLE to EOB    Transfers Overall transfer level: Needs assistance Equipment  used: Rolling walker (2 wheeled) Transfers: Sit to/from Stand Sit to Stand: Min guard         General transfer comment: Increased time to power up to stand, min guard for safety.  Ambulation/Gait Ambulation/Gait assistance: Min guard Gait Distance (Feet): 150 Feet Assistive device: Rolling walker (2 wheeled) Gait Pattern/deviations: Step-through pattern;Decreased step length - left;Decreased stance time - right;Decreased stride length;Decreased dorsiflexion - right;Decreased dorsiflexion - left;Decreased weight shift to right;Antalgic;Trunk flexed Gait velocity: reduced Gait velocity interpretation: <1.31 ft/sec, indicative of household ambulator General Gait Details: pt with poor posture, flexed trunk, needing cues to correct. pt with decreased wt acceptance to RLE, increased use of arms resulting in forward flexed posture. repeated cues to correct with gait.     Balance Overall balance assessment: Needs assistance Sitting-balance support: No upper extremity supported;Feet supported Sitting balance-Leahy Scale: Fair Sitting balance - Comments: Static sitting EOB no LOB, supervision for safety.   Standing balance support: No upper extremity supported;Bilateral upper extremity supported;During functional activity;Single extremity supported Standing balance-Leahy Scale: Fair Standing balance comment: Able to stand statically without UE support but needs at least 1 UE support for mobility, min guard.                            Cognition Arousal/Alertness: Awake/alert Behavior During Therapy: WFL for tasks assessed/performed Overall Cognitive Status: Within Functional Limits for tasks assessed                                 General Comments: repeated education provided on posture and  positioning of RW, pt son reinforcing education. asking good questions about precautions.      Exercises      General Comments General comments (skin integrity, edema,  etc.): Translator Tobi Bastos [id number] used, pt son stating they did not need translator as he could provide translation      Pertinent Vitals/Pain Pain Assessment: Faces Faces Pain Scale: Hurts little more Pain Location: R hip Pain Descriptors / Indicators: Grimacing;Guarding;Discomfort Pain Intervention(s): Limited activity within patient's tolerance;Monitored during session;Repositioned     PT Goals (current goals can now be found in the care plan section) Acute Rehab PT Goals Patient Stated Goal: to go home PT Goal Formulation: With patient/family Time For Goal Achievement: 06/29/20 Potential to Achieve Goals: Good Progress towards PT goals: Progressing toward goals    Frequency    7X/week      PT Plan Current plan remains appropriate       AM-PAC PT "6 Clicks" Mobility   Outcome Measure  Help needed turning from your back to your side while in a flat bed without using bedrails?: A Little Help needed moving from lying on your back to sitting on the side of a flat bed without using bedrails?: A Little Help needed moving to and from a bed to a chair (including a wheelchair)?: A Little Help needed standing up from a chair using your arms (e.g., wheelchair or bedside chair)?: A Little Help needed to walk in hospital room?: A Little Help needed climbing 3-5 steps with a railing? : A Little 6 Click Score: 18    End of Session Equipment Utilized During Treatment: Gait belt Activity Tolerance: Patient tolerated treatment well Patient left: in chair;with call bell/phone within reach;with family/visitor present (with OT) Nurse Communication: Mobility status PT Visit Diagnosis: Unsteadiness on feet (R26.81);Other abnormalities of gait and mobility (R26.89);Muscle weakness (generalized) (M62.81);Difficulty in walking, not elsewhere classified (R26.2);Pain Pain - Right/Left: Right Pain - part of body: Hip     Time: [id number] PT Time Calculation (min) (ACUTE ONLY): 35  min  Charges:  $Gait Training: 8-22 mins $Self Care/Home Management: 8-22                     Rolm Baptise, PT, DPT   Acute Rehabilitation Department Pager #: [phone]    Gaetana Michaelis 06/25/2020, 10:25 AM

## 2020-06-26 ENCOUNTER — Telehealth: Payer: Self-pay | Admitting: *Deleted

## 2020-06-26 ENCOUNTER — Telehealth: Payer: Self-pay

## 2020-06-26 NOTE — Telephone Encounter (Signed)
Transition Care Management Follow-up Telephone Call Date of discharge and from where: 06/25/2020 [facility] How have you been since you were released from the hospital? "In some pain but I am okay" Any questions or concerns? No  Items Reviewed: Did the pt receive and understand the discharge instructions provided? Yes  Medications obtained and verified? Yes  Other? No  Any new allergies since your discharge? No  Dietary orders reviewed? No Do you have support at home? Yes   Home Care and Equipment/Supplies: Were home health services ordered? no If so, what is the name of the agency? N/A  Has the agency set up a time to come to the patient's home? not applicable Were any new equipment or medical supplies ordered?  Yes: Dan Humphreys What is the name of the medical supply agency? [facility] Were you able to get the supplies/equipment? yes Do you have any questions related to the use of the equipment or supplies? No  Functional Questionnaire: (I = Independent and D = Dependent) ADLs: I  Bathing/Dressing- D  Meal Prep- D  Eating- I  Maintaining continence- I  Transferring/Ambulation- D  Managing Meds- I  Follow up appointments reviewed:  PCP Hospital f/u appt confirmed? Yes  Scheduled to see Dr. Delford Field on 07/13/2020 @ 0930. Specialist Hospital f/u appt confirmed? Yes  Scheduled to see Dr. Magnus Ivan for post op on 07/07/2020 @ 0915. Are transportation arrangements needed? No  If their condition worsens, is the pt aware to call PCP or go to the Emergency Dept.? Yes Was the patient provided with contact information for the PCP's office or ED? Yes Was to pt encouraged to call back with questions or concerns? Yes

## 2020-06-26 NOTE — Telephone Encounter (Signed)
Transition Care Management Follow-up Telephone Call Date of discharge and from where: [facility] on 06/25/2020 How have you been since you were released from the hospital? called pt son answered / [facility] DPR SIGNED ON 12-17-19 MAY SPEAK WITH HUSSEIN [facility] stated "she is doing okay" Any questions or concerns? No   Items Reviewed: Did the pt receive and understand the discharge instructions provided? Yes  Medications obtained and verified? Yes  Other? No  Any new allergies since your discharge? No  Dietary orders reviewed? No Do you have support at home? Yes    Home Care and Equipment/Supplies: Were home health services ordered? YES [facility] If so, what is the name of the agency? [facility]  Has the agency set up a time to come to the patient's home?scheduled for today at 2pm Were any new equipment or medical supplies ordered?  Yes: Dan Humphreys What is the name of the medical supply agency? [facility] Were you able to get the supplies/equipment? yes Do you have any questions related to the use of the equipment or supplies? No   Functional Questionnaire: (I = Independent and D = Dependent) ADLs: I     Follow up appointments reviewed:   PCP Hospital f/u appt confirmed? Yes  Scheduled to see Dr. Delford Field on 07/13/2020 @ 0930. Specialist Hospital f/u appt confirmed? Yes  Scheduled to see Dr. Magnus Ivan for post op on 07/07/2020 @ 0915. Are transportation arrangements needed? No  If their condition worsens, is the pt aware to call PCP or go to the Emergency Dept.? Yes Was the patient provided with contact information for the PCP's office or ED? Yes Was to pt encouraged to call back with questions or concerns? Yes

## 2020-07-07 ENCOUNTER — Encounter: Payer: Medicare Other | Admitting: Orthopaedic Surgery

## 2020-07-07 ENCOUNTER — Ambulatory Visit (INDEPENDENT_AMBULATORY_CARE_PROVIDER_SITE_OTHER): Payer: Medicare Other | Admitting: Orthopaedic Surgery

## 2020-07-07 ENCOUNTER — Encounter: Payer: Self-pay | Admitting: Orthopaedic Surgery

## 2020-07-07 VITALS — Ht 66.0 in | Wt 147.0 lb

## 2020-07-07 DIAGNOSIS — Z96641 Presence of right artificial hip joint: Secondary | ICD-10-CM

## 2020-07-07 MED ORDER — HYDROCODONE-ACETAMINOPHEN 5-325 MG PO TABS: 1.0000 | ORAL_TABLET | Freq: Four times a day (QID) | ORAL | 0 refills | Status: DC | PRN

## 2020-07-07 NOTE — Progress Notes (Signed)
Patient is now 2 weeks status post a right total hip arthroplasty.  She is non-English-speaking and has an interpreter with her as well as her son.  She has been on aspirin just once a day even though we monitor her on twice a day and I think that was used communication issues.  The right hip incision looks good.  I remove the staples in place Steri-Strips.  There was a moderate seroma and I aspirated about 50 cc of fluid off of the hip.  There was no worrisome features.  I explained this to the interpreter to them.  They are requesting a refill of hydrocodone which I think is appropriate.  She is ambulate with a cane and overall feels like things are going well.  I gave her instructions about wound care.  We will see her back in 4 weeks to see how she is doing overall but no x-rays are needed.

## 2020-07-10 ENCOUNTER — Telehealth: Payer: Self-pay | Admitting: Orthopaedic Surgery

## 2020-07-10 NOTE — Telephone Encounter (Signed)
Charlie Pitter is the PT working with patient. She would like orders to extend PT 2x wk for 3 wks. Her call back number is [phone]

## 2020-07-12 NOTE — Progress Notes (Signed)
Subjective:    Patient ID: Norma Clay, female    DOB: Nov 17, 1950, 70 y.o.   MRN: [id number]  70 y.o. F  Here to re est pcp former [facility] pt  12/16/19 The visit is assisted by Deforest Hoyles the patient's son who preserved as interpreter Htn T2DM Hypoparathyroidism surgical s/p partial thyroidectomy in [place] years ago.  This is a former primary care patient of Dr. Chapman Fitch here to establish.  Patient's not been seen in the clinic since May of this year.  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  The patient's been referred to physical therapy and was given a short course of tramadol.  The patient states her pain in the back is still there but is better since the physical therapy.  Also there is history of parathyroidectomy and thyroidectomy in the past.  Follow-up ultrasound of blood is left of the thyroid shows a residual right lobe of the thyroid with a nodule seen present.  This was ordered by endocrinology and this will need to be followed up upon.  Note the patient does need a mammogram Pneumovax flu vaccine and A1c along with foot exam and colon cancer screening  The patient was given the flu vaccine and Pneumovax in this visit and the A1c on arrival was at 7.7 with blood glucose of 272.  The patient states through her son the interpreter that she has trouble with maintaining her Metformin dosing and often skips doses also her glimepiride is supposed to be at meals and she often skips these doses as well.  Her blood glucose often is in the 90-80 range late in the afternoon but note today she had a fairly heavy carbohydrate meal and her blood glucose is 272  Note on arrival blood pressure 108/72 and the patient is compliant with her blood pressure medications.  The patient has a myriad of complaints including pain in the right ear, scalp irritation, pain in the hip and back, wanting to know what the ultrasound of the  throat showed.  Also the patient has bilateral foot pain is seen podiatry in the past has hammertoe in the second digit of both feet and was prescribed orthotics with the patient cannot afford that now the patient has Medicaid and may be able to afford some type of special shoe.  Note she arrives with thick socks and house shoes  The son does state the patient had a Covid vaccine is actually recently had her booster shot she received the [place] vaccine series  02/26/2020 Patient is seen today in follow-up the visit is done in [place] assisted by the patient's daughter who is is with her today  The patient had since seen neurology they ordered an MRI it was done 6 January but nobody has followed the patient up with this result as of yet.  MRI is reviewed and shows small vessel disease but no changes compatible with Alzheimer's dementia  I reviewed the neurology note the patient's dementia blood work-up was normal  Patient does not drink alcohol  In reviewing the patient's medications is completely unclear to me which medicine she is taking and not taking.  She is taking both the Metformin and the Trulicity but complains of injection site pain  She is only using her thighs for the injections the daughter is helping her with this  Patient needs reeducation in this regard  Blood pressure on arrival is adequate 91/60 it is unclear  to me whether she is taking her thyroxine and wrote Calcitrol for her post surgical hypothyroidism and hypoparathyroidism  She is taking the vitamin D  Note on arrival hemoglobin A1c is 6.8 improved from prior values  Question of MRI changes compatible with liver disease note ammonia level is normal and liver functions were normal in February 2021  04/13/20 Today the patient is accompanied by her son who provides Arabic interpretation he actually works as an Astronomer himself.  His name is Deforest Hoyles                           This patient was seen by her clinical  pharmacist on on March 30 documentation is as below [facility] 3/30: Diabetes longstanding currently controlled. Medication adherence appears optimal. Patient experiencing injection site pain and does not want to continue Trulicity - pt prefers an oral option. Will start an SGLT-2 inhibitor and check BMP at next visit.    -Discontinued Trulicity (dulaglutide) -Started Jardiance (empaglflozin) 10 mg daily -Continued metformin 850 mg BID -F/u labs ordered - BMET at next visit -Extensively discussed pathophysiology of diabetes, recommended lifestyle interventions, dietary effects on blood sugar control -Counseled on s/sx of and management of hypoglycemia -Next A1C anticipated May 2022.    ASCVD risk - primary prevention in patient with diabetes. Last LDL is controlled. ASCVD risk score is not >20%  - moderate intensity statin indicated.  -Continued atorvastatin 20 mg daily  Patient saw orthopedics March 31 documentation is as below Saw Ortho 04/02/20: Primary osteoarthritis of right hip      Plan: Impression is end-stage right hip degenerative joint disease.  Through a language interpreter I was able to explain to her the various treatment options that included nonsurgical versus surgical and their likelihood of providing sustained relief.  I explained that her medical comorbidities are not contraindications to having surgery but that we would certainly make sure that they were optimized before performing surgery to mitigate perioperative risk.  She will take some time to think about her options and have a discussion with her family members.  She will follow-up with either Dr. Junius Roads or Dr. Ernestina Patches periodically for cortisone injections if she chooses to have additional ones.  She will follow up with me when she is ready for surgery.   Patient is having difficulty ambulating now with a cane has increased pain in the right hip but is not wishing to have a hip replacement.  She has been referred to  pain management for corticosteroid injection of the right hip.  Patient also has been seen by neurology previously they have not made a complete diagnosis on her memory loss her brain MRI was unremarkable she had mild changes on her mental status exam questions.  Patient is also been to see podiatry they recommend orthotic shoes for her diabetic neuropathy and chronic pain in the feet.  The patient was sent to an [facility] and they are wishing documentation from this clinic regarding this patient's diabetes.  This patient does have type 2 diabetes ICD 10 code is as noted on the documentation of this chart.  Patient had elevated glucose levels and A1c however today on arrival A1c is now to 6.6 blood glucoses are improved on Metformin and Jardiance.  Patient is under comprehensive pain plan of care for her diabetes and see assessment for the plan of care  Patient does meet necessity for diabetic shoes see assessment and plan of  care    The podiatrist documentation is included with this.  I agree with the plan of care of the podiatrist.  The patient has severe peripheral neuropathy and evidence of callus formation in the feet.   The patient is yet to achieve the mammogram and is not turned on her fecal occult study.  The patient is compliant with her thyroid medicine and thyroid function has been normal on current dose.  Patient had a recent diabetic eye exam retina is good but she has severe glaucoma and is on now 5 different eyedrops for uveitis and  7/11 this visit was assisted with Arabic interpretation by video interpreter loBrana [phone] Patient seen in return follow-up since the last visit the patient did have a right total hip replacement she is doing well with this she is tapering down off pain medicines.  She does have some constipation with this and some reflux type symptoms with epigastric pain.  Blood pressure on arrival is good 127/78.  She stopped her amlodipine and losartan  because she is having low numbers at home.  Patient still has issues with diabetic neuropathy and hammertoe and is followed by podiatry  Patient is compliant with her diabetic program and A1c was down to 6.6 previously she is on the Jardiance and metformin.  She does have excess urination on the Jardiance  Patient does maintain her Synthroid patient also maintains her eyedrops for glaucoma  Past Medical History:  Diagnosis Date   Diabetes mellitus without complication ([place])    Glaucoma    Hypertension    Osteoarthritis of right hip 05/16/2016   Posterior capsular opacification of both eyes, 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     Family History  Problem Relation Age of Onset   Diabetes Mother    Colon cancer Neg Hx    Stomach cancer Neg Hx    Esophageal cancer Neg Hx      Social History   Socioeconomic History   Marital status: Widowed    Spouse name: Not on file   Number of children: Not on file   Years of education: Not on file   Highest education level: Not on file  Occupational History   Not on file  Tobacco Use   Smoking status: Never   Smokeless tobacco: Never  Vaping Use   Vaping Use: Never used  Substance and Sexual Activity   Alcohol use: No   Drug use: No   Sexual activity: Not Currently  Other Topics Concern   Not on file  Social History Narrative   Not on file   Social Determinants of Health   Financial Resource Strain: Not on file  Food Insecurity: Not on file  Transportation Needs: Not on file  Physical Activity: Not on file  Stress: Not on file  Social Connections: Not on file  Intimate Partner Violence: Not on file     Allergies  Allergen Reactions   Penicillins Rash    Patient was placed on a form of penicillin by her dentist and developed a rash therefore medication was changed to clindamycin.  Per son, patient is not allergic to clindamycin     Outpatient Medications Prior to Visit  Medication Sig Dispense Refill   Accu-Chek Softclix Lancets  lancets USE AS INSTRUCTED USE TO CHECK 3 TIMES DAILY (Patient taking differently: USE AS INSTRUCTED USE TO CHECK 3 TIMES DAILY) 100 each 2   Blood Glucose Monitoring Suppl (ACCU-CHEK GUIDE ME) w/Device KIT Use to check TID. 1 kit 0   brimonidine (  ALPHAGAN) 0.2 % ophthalmic solution Place 1 drop into the right eye 2 (two) times daily 5 mL 12   calcium carbonate (OS-CAL) 1250 (500 Ca) MG chewable tablet Chew 1 tablet by mouth 3 (three) times daily.     Cholecalciferol (VITAMIN D3) 10 MCG (400 UNIT) tablet Take 1 tablet (400 Units total) by mouth daily. 90 tablet 1   dorzolamide-timolol (COSOPT) 22.3-6.8 MG/ML ophthalmic solution Place 1 drop into both eyes 2 (two) times daily 40 mL 3   empagliflozin (JARDIANCE) 10 MG TABS tablet TAKE 1 TABLET (10 MG TOTAL) BY MOUTH DAILY BEFORE BREAKFAST. (Patient taking differently: Take 10 mg by mouth daily before breakfast.) 30 tablet 11   glucose blood test strip USE TO CHECK 3 TIMES DAILY (Patient taking differently: USE TO CHECK 3 TIMES DAILY) 100 strip 2   HYDROcodone-acetaminophen (NORCO/VICODIN) 5-325 MG tablet Take 1-2 tablets by mouth every 6 (six) hours as needed for moderate pain (pain score 4-6). 30 tablet 0   levothyroxine (SYNTHROID) 88 MCG tablet Take 1 tablet (88 mcg total) by mouth daily. 90 tablet 3   Travoprost, BAK Free, (TRAVATAN) 0.004 % SOLN ophthalmic solution Place 1 drop into both eyes nightly 2.5 mL 6   atorvastatin (LIPITOR) 20 MG tablet Take 1 tablet (20 mg total) by mouth daily. 30 tablet 2   brimonidine (ALPHAGAN) 0.2 % ophthalmic solution Apply to eye.     calcitRIOL (ROCALTROL) 0.25 MCG capsule TAKE 2 CAPSULES (0.5 MCG TOTAL) BY MOUTH DAILY. (Patient taking differently: Take 0.5 mcg by mouth daily.) 180 capsule 3   metFORMIN (GLUCOPHAGE) 850 MG tablet TAKE 1 TABLET (850 MG TOTAL) BY MOUTH 2 (TWO) TIMES DAILY WITH A MEAL. (Patient taking differently: Take 850 mg by mouth 2 (two) times daily with a meal.) 180 tablet 2   amLODipine  (NORVASC) 5 MG tablet TAKE 1 TABLET (5 MG TOTAL) BY MOUTH DAILY. TO LOWER BLOOD PRESSURE (Patient not taking: Reported on 07/13/2020) 90 tablet 3   aspirin 81 MG chewable tablet Chew 1 tablet (81 mg total) by mouth 2 (two) times daily. (Patient not taking: Reported on 07/13/2020) 30 tablet 0   losartan (COZAAR) 25 MG tablet TAKE 1 TABLET (25 MG TOTAL) BY MOUTH DAILY. (Patient not taking: Reported on 07/13/2020) 90 tablet 3   oseltamivir (TAMIFLU) 75 MG capsule Take 75 mg by mouth 2 (two) times daily.     No facility-administered medications prior to visit.      Review of Systems  Respiratory:  Negative for cough, choking, chest tightness and shortness of breath.   Cardiovascular:  Positive for chest pain. Negative for palpitations.  Gastrointestinal:  Positive for abdominal pain.      Objective:   Physical Exam Vitals:   07/13/20 0943  BP: 127/77  Pulse: 73  SpO2: 100%  Weight: 150 lb (68 kg)  Height: $Remove'5\' 6"'fItAfHo$  (1.676 m)    Gen: Pleasant, well-nourished, in no distress,  normal affect  ENT: No lesions,  mouth clear,  oropharynx clear, no postnasal drip  Neck: No JVD, no TMG, no carotid bruits  Lungs: No use of accessory muscles, no dullness to percussion, clear without rales or rhonchi  Cardiovascular: RRR, heart sounds normal, no murmur or gallops, no peripheral edema  Abdomen: soft and NT, no HSM,  BS normal  Musculoskeletal: No deformities, no cyanosis or clubbing  Neuro: alert, non focal  Skin: Warm, dry scalp, oily hair  MRI 01/09/20 MRI brain without and with contrast:   INDICATION:  Memory loss  IMPRESSION:   1.  No acute intracranial pathology identified.   2.  Mild chronic small vessel ischemic change.   3.  T1 shortening in the bilateral basal ganglia may reflect hepatic disease or other mineral deposition.   4.  Mild cerebral atrophy. No disproportionate atrophy seen in the medial temporal lobe hippocampi to suggest Alzheimer's disease    TECHNIQUE:  Imaging of the brain was performed in 3 planes utilizing a combination of T1, FLAIR, and T2 weighting. Diffusion images were performed. In addition, sagittal and axial T1-weighted sequences were performed after intravenous injection of 5 mL Gadavist.   COMPARISON: None   FINDINGS:  #  Brain parenchyma: There are mild scattered and confluent foci of high T2/FLAIR signal abnormality within the periventricular white matter most  commonly seen with small vessel ischemic changes.   #  T1 shortening in the bilateral basal ganglia may reflect hepatic disease or other mineral deposition.   #  Mild cerebral atrophy. No disproportionate atrophy seen in the medial temporal lobe hippocampi to suggest Alzheimer's disease   #   Diffusion-weighted images are normal indicating no acute infarct.   #   No evidence mass or hemorrhage.  No abnormal enhancement.  #    #  Ventricles: Normal without dilatation, mass effect, or midline shift.   #  Sella and parasellar region: Normal.  #  Craniovertebral junction: Normal.   #  Mastoids and paranasal sinuses: Clear.  #  Orbits: Normal   #  Major cerebral vessels and dural sinuses: Patent.  #  Bony structures and scalp: Unremarkable.      Assessment & Plan:  I personally reviewed all images and lab data in the [facility] system as well as any outside material available during this office visit and agree with the  radiology impressions.   Essential hypertension We will hold blood pressure medicines for now and observe Obtain metabolic panel  Type 2 diabetes mellitus without complication, without long-term current use of insulin (HCC) Diabetes is at goal maintain metformin and Jardiance  Acquired hypothyroidism Maintain Synthroid as prescribed  Hyperlipidemia associated with type 2 diabetes mellitus ([place]) Maintain atorvastatin  Post-surgical hypoparathyroidism (HCC) Continue Ro Calcitrol  Osteoarthritis of right hip Status post total hip replacement on  the right   Saman was seen today for diabetes.  Diagnoses and all orders for this visit:  Type 2 diabetes mellitus without complication, without long-term current use of insulin (HCC) -     POCT glucose (manual entry) -     metFORMIN (GLUCOPHAGE) 850 MG tablet; Take 1 tablet (850 mg total) by mouth 2 (two) times daily with a meal. -     Basic Metabolic Panel  Hyperlipidemia associated with type 2 diabetes mellitus (HCC) -     atorvastatin (LIPITOR) 20 MG tablet; Take 1 tablet (20 mg total) by mouth daily.  Essential hypertension  Acquired hypothyroidism  Post-surgical hypoparathyroidism (HCC)  Primary osteoarthritis of right hip  Other orders -     calcitRIOL (ROCALTROL) 0.25 MCG capsule; Take 2 capsules (0.5 mcg total) by mouth daily. -     omeprazole (PRILOSEC) 40 MG capsule; Take 1 capsule (40 mg total) by mouth daily.

## 2020-07-13 ENCOUNTER — Other Ambulatory Visit: Payer: Self-pay

## 2020-07-13 ENCOUNTER — Encounter: Payer: Medicare Other | Admitting: Orthopaedic Surgery

## 2020-07-13 ENCOUNTER — Encounter: Payer: Self-pay | Admitting: Critical Care Medicine

## 2020-07-13 ENCOUNTER — Ambulatory Visit: Payer: Medicare Other | Attending: Critical Care Medicine | Admitting: Critical Care Medicine

## 2020-07-13 VITALS — BP 127/77 | HR 73 | Ht 66.0 in | Wt 150.0 lb

## 2020-07-13 DIAGNOSIS — E1169 Type 2 diabetes mellitus with other specified complication: Secondary | ICD-10-CM | POA: Diagnosis not present

## 2020-07-13 DIAGNOSIS — R1013 Epigastric pain: Secondary | ICD-10-CM | POA: Diagnosis not present

## 2020-07-13 DIAGNOSIS — E892 Postprocedural hypoparathyroidism: Secondary | ICD-10-CM | POA: Diagnosis not present

## 2020-07-13 DIAGNOSIS — E039 Hypothyroidism, unspecified: Secondary | ICD-10-CM | POA: Diagnosis not present

## 2020-07-13 DIAGNOSIS — E119 Type 2 diabetes mellitus without complications: Secondary | ICD-10-CM | POA: Diagnosis not present

## 2020-07-13 DIAGNOSIS — E1142 Type 2 diabetes mellitus with diabetic polyneuropathy: Secondary | ICD-10-CM | POA: Diagnosis present

## 2020-07-13 DIAGNOSIS — E785 Hyperlipidemia, unspecified: Secondary | ICD-10-CM | POA: Insufficient documentation

## 2020-07-13 DIAGNOSIS — Z7984 Long term (current) use of oral hypoglycemic drugs: Secondary | ICD-10-CM | POA: Diagnosis not present

## 2020-07-13 DIAGNOSIS — Z79899 Other long term (current) drug therapy: Secondary | ICD-10-CM | POA: Diagnosis not present

## 2020-07-13 DIAGNOSIS — Z833 Family history of diabetes mellitus: Secondary | ICD-10-CM | POA: Insufficient documentation

## 2020-07-13 DIAGNOSIS — M1611 Unilateral primary osteoarthritis, right hip: Secondary | ICD-10-CM | POA: Diagnosis not present

## 2020-07-13 DIAGNOSIS — I10 Essential (primary) hypertension: Secondary | ICD-10-CM | POA: Diagnosis not present

## 2020-07-13 LAB — GLUCOSE, POCT (MANUAL RESULT ENTRY): POC Glucose: 159 mg/dl — AB (ref 70–99)

## 2020-07-13 MED ORDER — CALCITRIOL 0.25 MCG PO CAPS
0.5000 ug | ORAL_CAPSULE | Freq: Every day | ORAL | 11 refills | Status: DC
  Filled 2020-07-13 – 2020-07-27 (×3): qty 60, 30d supply, fill #0
  Filled 2020-08-24: qty 60, 30d supply, fill #1

## 2020-07-13 MED ORDER — OMEPRAZOLE 40 MG PO CPDR
40.0000 mg | DELAYED_RELEASE_CAPSULE | Freq: Every day | ORAL | 3 refills | Status: DC
  Filled 2020-07-13 – 2020-07-27 (×3): qty 30, 30d supply, fill #0
  Filled 2020-08-24: qty 30, 30d supply, fill #1

## 2020-07-13 MED ORDER — ATORVASTATIN CALCIUM 20 MG PO TABS
20.0000 mg | ORAL_TABLET | Freq: Every day | ORAL | 2 refills | Status: DC
  Filled 2020-07-13 – 2020-07-26 (×2): qty 30, 30d supply, fill #0
  Filled 2020-08-24: qty 30, 30d supply, fill #1

## 2020-07-13 MED ORDER — METFORMIN HCL 850 MG PO TABS
850.0000 mg | ORAL_TABLET | Freq: Two times a day (BID) | ORAL | 11 refills | Status: DC
  Filled 2020-07-13 – 2020-07-27 (×3): qty 60, 30d supply, fill #0
  Filled 2020-08-24: qty 60, 30d supply, fill #1

## 2020-07-13 NOTE — Progress Notes (Signed)
Having headaches daily. Swelling and pain in right ankle.

## 2020-07-13 NOTE — Assessment & Plan Note (Signed)
Status post total hip replacement on the right

## 2020-07-13 NOTE — Assessment & Plan Note (Signed)
Diabetes is at goal maintain metformin and Jardiance

## 2020-07-13 NOTE — Assessment & Plan Note (Signed)
We will hold blood pressure medicines for now and observe Obtain metabolic panel

## 2020-07-13 NOTE — Assessment & Plan Note (Signed)
Maintain atorvastatin 

## 2020-07-13 NOTE — Telephone Encounter (Signed)
Verbal order left on VM  

## 2020-07-13 NOTE — Assessment & Plan Note (Signed)
Maintain Synthroid as prescribed

## 2020-07-13 NOTE — Assessment & Plan Note (Signed)
Continue Ro Calcitrol

## 2020-07-13 NOTE — Patient Instructions (Signed)
Using the brochure we gave you contact the cancer control program for free breast cancer screening  Please process the fecal occult kit you already have at home and bring it and so we can get you screened for colon cancer  Labs today are ordered to check for body chemistry values  Discontinue amlodipine and discontinue losartan  Refills on other medications sent to our pharmacy  Begin omeprazole 1 daily this was sent to our pharmacy take it half hour before eating  See attachment for reflux diet and also how to increase fiber in the diet  Monitor the blood pressure at home and return in 2 months for reassessment  ?????? ?????? ???? ?????? ?? ? ???? ??????? ?????? ??????? ?????? ??? ??? ????? ?????? ?????  ????? ?????? ?????? ????? ????? ?? ?????? ???? ???? ?????? ?? ?????? ???????? ??? ????? ?? ???? ????? ?? ????? ???????  ?? ??? ????????? ????? ?????? ?? ??? ?????? ?????  ???? ?? ????? ????????? ????? ?? ??????? ??????????  ????? ??????? ?????? ??????? ??? ????????  ???? ?????? ?????????? 1 ?????? ??? ?????? ??? ???????? ??? ?????? ???? ????  ???? ??? ???? ?????? ??????? ????? ????? ????? ????? ??????? ?? ?????? ???????  ?????? ??? ???? ?? ?????? ??????? ??? ????? ?????? ??????? biastikhdam alkutayb aladhi qadamnah lak , aitasal bibarnamaj mukafahat alsaratan lilhusul ealaa fahs majaaniin lisaratan althady yurja muealajat majmueat 'adawat alkashf ean alburaz alati ladayk bialfiel fi almanzil wa'iihdariha hataa natamakan min fahsik lilkashf ean saratan alqawlun tama talab almukhtabarat alyawm liltahaquq min qiam kimya' aljism tawaqaf ean tanawul 'umludibin watawaqaf ean astikhdam alluwisaratan eubuaat al'adwiat al'ukhraa almursalat 'iilaa alsaydalia abda bitanawul 'uwmibrazul 1 ywmyan watama 'iirsaluh 'iilaa alsaydaliat qabl tanawulih binisf saea anzur 'iilaa mulhaq alnizam alghidhayiyi liljuzur [place] ziadat al'alyaf fi alnizam alghidhayiyi muraqabat daght aldam fi  almanzil waleawdat baed shahrayn li'iieadat altaqyim

## 2020-07-14 LAB — BASIC METABOLIC PANEL
BUN/Creatinine Ratio: 23 (ref 12–28)
BUN: 23 mg/dL (ref 8–27)
CO2: 24 mmol/L (ref 20–29)
Calcium: 9.4 mg/dL (ref 8.7–10.3)
Chloride: 101 mmol/L (ref 96–106)
Creatinine, Ser: 1.01 mg/dL — ABNORMAL HIGH (ref 0.57–1.00)
Glucose: 134 mg/dL — ABNORMAL HIGH (ref 65–99)
Potassium: 4.6 mmol/L (ref 3.5–5.2)
Sodium: 139 mmol/L (ref 134–144)
eGFR: 60 mL/min/{1.73_m2} (ref >59)

## 2020-07-20 ENCOUNTER — Other Ambulatory Visit: Payer: Self-pay

## 2020-07-25 ENCOUNTER — Other Ambulatory Visit: Payer: Self-pay

## 2020-07-25 ENCOUNTER — Ambulatory Visit (HOSPITAL_COMMUNITY)
Admission: EM | Admit: 2020-07-25 | Discharge: 2020-07-25 | Disposition: A | Discharge status: Home / Self Care | Payer: Medicare Other | Attending: Urgent Care | Admitting: Urgent Care

## 2020-07-25 ENCOUNTER — Encounter (HOSPITAL_COMMUNITY): Payer: Self-pay

## 2020-07-25 DIAGNOSIS — E118 Type 2 diabetes mellitus with unspecified complications: Secondary | ICD-10-CM

## 2020-07-25 DIAGNOSIS — E119 Type 2 diabetes mellitus without complications: Secondary | ICD-10-CM

## 2020-07-25 DIAGNOSIS — Z760 Encounter for issue of repeat prescription: Secondary | ICD-10-CM

## 2020-07-25 DIAGNOSIS — Z7984 Long term (current) use of oral hypoglycemic drugs: Secondary | ICD-10-CM

## 2020-07-25 DIAGNOSIS — Z9889 Other specified postprocedural states: Secondary | ICD-10-CM

## 2020-07-25 MED ORDER — EMPAGLIFLOZIN 10 MG PO TABS: ORAL_TABLET | Freq: Every day | ORAL | 0 refills | Status: DC

## 2020-07-25 MED ORDER — ACETAMINOPHEN 325 MG PO TABS: 325.0000 mg | ORAL_TABLET | Freq: Four times a day (QID) | ORAL | 0 refills | Status: AC | PRN

## 2020-07-25 NOTE — ED Triage Notes (Signed)
Pt requesting medication refill

## 2020-07-25 NOTE — Discharge Instructions (Addendum)
Please make sure you follow up with the orthopedist that did your surgery. I do not recommend using hydrocodone for as long as you have. Please let your surgeon know that you are still having pain. In the meantime, use please just use Tylenol at a dose of 500mg -650mg  once every 6 hours as needed for your aches, pains, fevers.

## 2020-07-25 NOTE — ED Provider Notes (Signed)
[place]   MRN: [id number] DOB: 1950/08/31  Subjective:   Norma Clay is a 70 y.o. female presenting for medication refill.  Patient is a type II diabetic without use of insulin and needs a refill of her Jardiance.  She also had a total hip arthroplasty of the right side in June.  Has been using hydrocodone daily for this.  Has gotten multiple refills as confirmed by database.  She has a follow-up scheduled with her surgeon in 2 days.  Denies fever, nausea, vomiting, chest pain, abdominal pain, falls, trauma.  No current facility-administered medications for this encounter.  Current Outpatient Medications:    HYDROcodone-acetaminophen (NORCO/VICODIN) 5-325 MG tablet, Take 1-2 tablets by mouth every 6 (six) hours as needed for moderate pain (pain score 4-6)., Disp: 30 tablet, Rfl: 0   Accu-Chek Softclix Lancets lancets, USE AS INSTRUCTED USE TO CHECK 3 TIMES DAILY (Patient taking differently: USE AS INSTRUCTED USE TO CHECK 3 TIMES DAILY), Disp: 100 each, Rfl: 2   atorvastatin (LIPITOR) 20 MG tablet, Take 1 tablet (20 mg total) by mouth daily., Disp: 30 tablet, Rfl: 2   Blood Glucose Monitoring Suppl (ACCU-CHEK GUIDE ME) w/Device KIT, Use to check TID., Disp: 1 kit, Rfl: 0   brimonidine (ALPHAGAN) 0.2 % ophthalmic solution, Place 1 drop into the right eye 2 (two) times daily, Disp: 5 mL, Rfl: 12   calcitRIOL (ROCALTROL) 0.25 MCG capsule, Take 2 capsules (0.5 mcg total) by mouth daily., Disp: 60 capsule, Rfl: 11   calcium carbonate (OS-CAL) 1250 (500 Ca) MG chewable tablet, Chew 1 tablet by mouth 3 (three) times daily., Disp: , Rfl:    Cholecalciferol (VITAMIN D3) 10 MCG (400 UNIT) tablet, Take 1 tablet (400 Units total) by mouth daily., Disp: 90 tablet, Rfl: 1   dorzolamide-timolol (COSOPT) 22.3-6.8 MG/ML ophthalmic solution, Place 1 drop into both eyes 2 (two) times daily, Disp: 40 mL, Rfl: 3   empagliflozin (JARDIANCE) 10 MG TABS tablet, TAKE 1 TABLET (10 MG TOTAL) BY  MOUTH DAILY BEFORE BREAKFAST. (Patient taking differently: Take 10 mg by mouth daily before breakfast.), Disp: 30 tablet, Rfl: 11   glucose blood test strip, USE TO CHECK 3 TIMES DAILY (Patient taking differently: USE TO CHECK 3 TIMES DAILY), Disp: 100 strip, Rfl: 2   levothyroxine (SYNTHROID) 88 MCG tablet, Take 1 tablet (88 mcg total) by mouth daily., Disp: 90 tablet, Rfl: 3   metFORMIN (GLUCOPHAGE) 850 MG tablet, Take 1 tablet (850 mg total) by mouth 2 (two) times daily with a meal., Disp: 60 tablet, Rfl: 11   omeprazole (PRILOSEC) 40 MG capsule, Take 1 capsule (40 mg total) by mouth daily., Disp: 30 capsule, Rfl: 3   Travoprost, BAK Free, (TRAVATAN) 0.004 % SOLN ophthalmic solution, Place 1 drop into both eyes nightly, Disp: 2.5 mL, Rfl: 6   Allergies  Allergen Reactions   Penicillins Rash    Patient was placed on a form of penicillin by her dentist and developed a rash therefore medication was changed to clindamycin.  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     Past Surgical History:  Procedure Laterality Date   ABDOMINAL HYSTERECTOMY     GLAUCOMA SURGERY     THYROIDECTOMY  TOTAL HIP ARTHROPLASTY Right 06/23/2020   Procedure: RIGHT TOTAL HIP ARTHROPLASTY ANTERIOR APPROACH;  Surgeon: Mcarthur Rossetti, MD;  Location: [facility];  Service: Orthopedics;  Laterality: Right;    Family History  Problem Relation Age of Onset   Diabetes Mother    Colon cancer Neg Hx    Stomach cancer Neg Hx    Esophageal cancer Neg Hx     Social History   Tobacco Use   Smoking status: Never   Smokeless tobacco: Never  Vaping Use   Vaping Use: Never used  Substance Use Topics   Alcohol use: No   Drug use: No    ROS   Objective:   Vitals: BP  (!) 159/85 (BP Location: Left Arm)   Pulse 96   Temp 97.9 F (36.6 C) (Oral)   Resp 18   SpO2 97%   Physical Exam Constitutional:      General: She is not in acute distress.    Appearance: Normal appearance. She is well-developed. She is not ill-appearing.  HENT:     Head: Normocephalic and atraumatic.     Nose: Nose normal.     Mouth/Throat:     Mouth: Mucous membranes are moist.     Pharynx: Oropharynx is clear.  Eyes:     General: No scleral icterus.    Extraocular Movements: Extraocular movements intact.     Pupils: Pupils are equal, round, and reactive to light.  Cardiovascular:     Rate and Rhythm: Normal rate.  Pulmonary:     Effort: Pulmonary effort is normal.  Skin:    General: Skin is warm and dry.  Neurological:     General: No focal deficit present.     Mental Status: She is alert and oriented to person, place, and time.  Psychiatric:        Mood and Affect: Mood normal.        Behavior: Behavior normal.      Assessment and Plan :   PDMP not reviewed this encounter.  1. Medication refill   2. Type 2 diabetes mellitus treated without insulin ([place])   3. Type 2 diabetes mellitus without complication, without long-term current use of insulin ([facility])   4. Status post hip surgery     I provided her with a 60-day refill of her Jardiance.  This will bridge her appointment with her PCP within that timeframe.  I counseled against continued use of hydrocodone this far out from her surgery.  Recommended she switch to Tylenol and keep her follow-up appointment with her orthopedic surgeon. Counseled patient on potential for adverse effects with medications prescribed/recommended today, ER and return-to-clinic precautions discussed, patient verbalized understanding.    Jaynee Eagles, [place] 07/25/20 1744

## 2020-07-27 ENCOUNTER — Other Ambulatory Visit: Payer: Self-pay

## 2020-07-27 ENCOUNTER — Ambulatory Visit (INDEPENDENT_AMBULATORY_CARE_PROVIDER_SITE_OTHER): Payer: Medicare Other | Admitting: Orthopaedic Surgery

## 2020-07-27 ENCOUNTER — Encounter: Payer: Self-pay | Admitting: Orthopaedic Surgery

## 2020-07-27 DIAGNOSIS — Z96641 Presence of right artificial hip joint: Secondary | ICD-10-CM

## 2020-07-27 NOTE — Progress Notes (Signed)
The patient is between 4 and 5 weeks status post a right total hip arthroplasty.  She is a 70 year old female.  She is non-English-speaking.  There is interpreter she states that the hip is doing well but she is having left-sided low back pain and right foot and ankle pain.  She actually injured this foot after a mechanical fall prior to her hip surgery.  She is ambulating with a cane.  She does have a therapist that comes to her house.  Her son is with her today as well.  Her right operative hip moves smoothly and fluidly.  The incision looks good.  I did remove the last Steri-Strip.  Her leg lengths are equal.  She is experiencing left-sided low back pain and right foot and ankle pain.  Hopefully we can just give these other areas time and she will improve the further she gets out from surgery.  We can see her back in 4 weeks for repeat exam to see how she is doing overall but no x-rays are needed.  If she still having significant back pain, I would recommend 2 views of the lumbar spine.  We can also x-ray the right ankle if she is continue to have right ankle pain.

## 2020-07-31 ENCOUNTER — Other Ambulatory Visit: Payer: Self-pay

## 2020-08-04 ENCOUNTER — Other Ambulatory Visit: Payer: Self-pay

## 2020-08-24 ENCOUNTER — Other Ambulatory Visit: Payer: Self-pay

## 2020-08-24 ENCOUNTER — Other Ambulatory Visit: Payer: Self-pay | Admitting: Critical Care Medicine

## 2020-08-24 DIAGNOSIS — E119 Type 2 diabetes mellitus without complications: Secondary | ICD-10-CM

## 2020-08-24 MED ORDER — EMPAGLIFLOZIN 10 MG PO TABS: ORAL_TABLET | Freq: Every day | ORAL | 6 refills | Status: DC

## 2020-08-24 NOTE — Telephone Encounter (Signed)
Notes to clinic:  Patient needed a 60 day refill, please disregard previous script for 30 day supply Medication filled by a different provider   Requested Prescriptions  Pending Prescriptions Disp Refills   empagliflozin (JARDIANCE) 10 MG TABS tablet 60 tablet 0    Sig: TAKE 1 TABLET (10 MG TOTAL) BY MOUTH DAILY BEFORE BREAKFAST.     Endocrinology:  Diabetes - SGLT2 Inhibitors Failed - 08/24/2020  9:46 AM      Failed - Cr in normal range and within 360 days    Creat  Date Value Ref Range Status  07/26/2019 0.85 0.60 - 0.93 mg/dL Final    Comment:    For patients >77 years of age, the reference limit for Creatinine is approximately 13% higher for people identified as African-American. .    Creatinine, Ser  Date Value Ref Range Status  07/13/2020 1.01 (H) 0.57 - 1.00 mg/dL Final          Failed - LDL in normal range and within 360 days    LDL Chol Calc (NIH)  Date Value Ref Range Status  04/17/2019 59 0 - 99 mg/dL Final          Passed - HBA1C is between 0 and 7.9 and within 180 days    HbA1c, POC (prediabetic range)  Date Value Ref Range Status  09/14/2017 7.4 (A) 5.7 - 6.4 % Final   HbA1c, POC (controlled diabetic range)  Date Value Ref Range Status  04/14/2020 6.6 0.0 - 7.0 % Final   Hgb A1c MFr Bld  Date Value Ref Range Status  06/22/2020 7.4 (H) 4.8 - 5.6 % Final    Comment:    (NOTE)         Prediabetes: 5.7 - 6.4         Diabetes: >6.4         Glycemic control for adults with diabetes: <7.0           Passed - eGFR in normal range and within 360 days    GFR calc Af Amer  Date Value Ref Range Status  02/08/2019 66 >59 mL/min/1.73 Final   GFR, Estimated  Date Value Ref Range Status  06/24/2020 >60 >60 mL/min Final    Comment:    (NOTE) Calculated using the CKD-EPI Creatinine Equation (2021)    GFR  Date Value Ref Range Status  11/22/2019 60.54 >60.00 mL/min Final    Comment:    Calculated using the CKD-EPI Creatinine Equation (2021)   eGFR   Date Value Ref Range Status  07/13/2020 60 >59 mL/min/1.73 Final          Passed - Valid encounter within last 6 months    Recent Outpatient Visits           1 month ago Essential hypertension   [place], Patrick E, MD   3 months ago Type 2 diabetes mellitus without complication, without long-term current use of insulin [facility])   [place], Annie Main L, RPH-CPP   4 months ago Type 2 diabetes mellitus without complication, without long-term current use of insulin ([place])   [place] Elsie Stain, MD   4 months ago Type 2 diabetes mellitus without complication, without long-term current use of insulin [facility])   [place], Annie Main L, RPH-CPP   6 months ago Type 2 diabetes mellitus without complication, without long-term current use of  insulin [facility])   [place], MD       Future Appointments             In 3 weeks Elsie Stain, MD [place]

## 2020-08-25 ENCOUNTER — Other Ambulatory Visit: Payer: Self-pay

## 2020-08-31 ENCOUNTER — Ambulatory Visit (INDEPENDENT_AMBULATORY_CARE_PROVIDER_SITE_OTHER): Payer: Medicare Other | Admitting: Orthopaedic Surgery

## 2020-08-31 ENCOUNTER — Ambulatory Visit: Payer: Self-pay

## 2020-08-31 ENCOUNTER — Encounter: Payer: Self-pay | Admitting: Orthopaedic Surgery

## 2020-08-31 ENCOUNTER — Other Ambulatory Visit: Payer: Self-pay

## 2020-08-31 DIAGNOSIS — Z96641 Presence of right artificial hip joint: Secondary | ICD-10-CM

## 2020-08-31 DIAGNOSIS — M5459 Other low back pain: Secondary | ICD-10-CM

## 2020-08-31 DIAGNOSIS — M545 Low back pain, unspecified: Secondary | ICD-10-CM

## 2020-08-31 DIAGNOSIS — M25571 Pain in right ankle and joints of right foot: Secondary | ICD-10-CM

## 2020-08-31 NOTE — Progress Notes (Signed)
Office Visit Note   Patient: Norma Clay           Date of Birth: Mar 23, 1950           MRN: [id number] Visit Date: 08/31/2020              Requested by: Storm Frisk, MD [address] [place],  [place] [address] PCP: Storm Frisk, MD   Assessment & Plan: Visit Diagnoses:  1. Status post total replacement of right hip   2. Acute bilateral low back pain without sciatica   3. Pain in right ankle and joints of right foot     Plan: She does walk with a significant lurch to her gait right side.  At this point, she would benefit from outpatient physical therapy to work on her balance and coordination in her gait in general.  This could include strengthening of her bilateral lower extremities and her lumbar spine that will hopefully help improve her posture and her gait in general.  They agree with this treatment plan through the interpreter.  They would like to have physical therapy set up here at [facility].  I will see her back in 6 weeks to see how she is doing overall but no x-rays are needed.  Follow-Up Instructions: Return in about 6 weeks (around 10/12/2020).   Orders:  Orders Placed This Encounter  Procedures   XR Lumbar Spine 2-3 Views   XR Ankle Complete Right   No orders of the defined types were placed in this encounter.     Procedures: No procedures performed   Clinical Data: No additional findings.   Subjective: Chief Complaint  Patient presents with   Right Ankle - Follow-up  The patient is a 70 year old female who is just past 8-week status post a right total hip arthroplasty to treat severe end-stage arthritis of the right hip.  She is non-English-speaking.  There is been concerned that she is still having low back pain to the right side and right ankle pain and she does not walk with a normal gait as result of those issues.  She denies any significant pain with her right operative hip.  There is an interpreter here with her that speaks  Arabic.  Family is with her as well.  She does ambulate with a quad cane.  She is a diabetic but reports good control.  HPI  Review of Systems There is currently listed no headache, chest pain, shortness of breath, fever, chills, nausea, vomiting  Objective: Vital Signs: There were no vitals taken for this visit.  Physical Exam She is alert and oriented and in no acute distress.  She follows commands appropriately. Ortho Exam Examination of her right operative hip shows that it moves smoothly and fluidly with no blocks to rotation and no pain.  Her right ankle shows some pain globally and there is some numbness around her right foot but there is no blocks to motion of her ankle and is well located.  She is a very thin individual.  There is pain in the lumbar spine throughout the lumbar spine. Specialty Comments:  No specialty comments available.  Imaging: XR Ankle Complete Right  Result Date: 08/31/2020 3 views of the right ankle show no acute findings.  XR Lumbar Spine 2-3 Views  Result Date: 08/31/2020 2 views of the lumbar spine show no acute findings.    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   Essential hypertension 02/19/2019   Postsurgical states following surgery of eye and adnexa 10/01/2018   Pseudophakia of both eyes 04/23/2018   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, severe stage 04/23/2018   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, moderate stage 04/23/2018   Type 2 diabetes mellitus  without complication, without long-term current use of insulin (HCC) 04/18/2016   Past Medical History:  Diagnosis Date   Diabetes mellitus without complication (HCC)    Glaucoma    Hypertension    Osteoarthritis of right hip 05/16/2016   Posterior capsular opacification of both eyes, 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    Family History  Problem Relation Age of Onset   Diabetes Mother    Colon cancer Neg Hx    Stomach cancer Neg Hx    Esophageal cancer Neg Hx     Past Surgical History:  Procedure Laterality Date   ABDOMINAL HYSTERECTOMY     GLAUCOMA SURGERY     THYROIDECTOMY     TOTAL HIP ARTHROPLASTY Right 06/23/2020   Procedure: RIGHT TOTAL HIP ARTHROPLASTY ANTERIOR APPROACH;  Surgeon: Kathryne Hitch, MD;  Location: [facility];  Service: Orthopedics;  Laterality: Right;   Social History   Occupational History   Not on file  Tobacco Use   Smoking status: Never   Smokeless tobacco: Never  Vaping Use   Vaping Use: Never used  Substance and Sexual Activity   Alcohol use: No   Drug use: No   Sexual activity: Not Currently

## 2020-09-02 ENCOUNTER — Ambulatory Visit: Payer: Medicare Other | Admitting: Rehabilitative and Restorative Service Providers"

## 2020-09-12 NOTE — Progress Notes (Signed)
Established Patient Office Visit  Subjective:  Patient ID: Norma Clay, female    DOB: 08-Mar-1950  Age: 70 y.o. MRN: [id number]  CC:  Chief Complaint  Patient presents with   Hypertension   Diabetes    HPI Norma Clay presents for primary care follow-up with complaints of [facility] now that she has Medicaid.  Patient complains of burning in both the right and left foot with some numbness and fungal type changes between the toes.  She has physical therapy upcoming for her hip replacement.  Patient has been at goal with her diabetes maintaining metformin and Jardiance.  Blood sugars have been good at 110 or less at home today she is 158 on arrival.  Patient has complaints of itching in the ears but no other complaints.  She needs a mammogram and needs to turn in her fecal occult study.  She also needs a urine microalbumin.    Past Medical History:  Diagnosis Date   Diabetes mellitus without complication ([place])    Glaucoma    Hypertension    Osteoarthritis of right hip 05/16/2016   Posterior capsular opacification of both eyes, 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   Primary osteoarthritis of right hip 05/06/2020    Past Surgical History:  Procedure Laterality Date   ABDOMINAL HYSTERECTOMY     GLAUCOMA SURGERY     THYROIDECTOMY     TOTAL HIP ARTHROPLASTY Right 06/23/2020   Procedure: RIGHT TOTAL HIP ARTHROPLASTY ANTERIOR APPROACH;  Surgeon: Mcarthur Rossetti, MD;  Location: [facility];  Service: Orthopedics;  Laterality: Right;    Family History  Problem Relation Age of Onset   Diabetes Mother    Colon cancer Neg Hx    Stomach cancer Neg Hx    Esophageal cancer Neg Hx     Social History   Socioeconomic History   Marital status: Widowed    Spouse name: Not on file   Number of children: Not on file   Years of education: Not on file   Highest education level: Not on file  Occupational History   Not on file  Tobacco Use   Smoking status: Never    Smokeless tobacco: Never  Vaping Use   Vaping Use: Never used  Substance and Sexual Activity   Alcohol use: No   Drug use: No   Sexual activity: Not Currently  Other Topics Concern   Not on file  Social History Narrative   Not on file   Social Determinants of Health   Financial Resource Strain: Not on file  Food Insecurity: Not on file  Transportation Needs: Not on file  Physical Activity: Not on file  Stress: Not on file  Social Connections: Not on file  Intimate Partner Violence: Not on file    Outpatient Medications Prior to Visit  Medication Sig Dispense Refill   brimonidine (ALPHAGAN) 0.2 % ophthalmic solution Apply to eye.     acetaminophen (TYLENOL) 325 MG tablet Take 1-2 tablets (325-650 mg total) by mouth every 6 (six) hours as needed for moderate pain. 30 tablet 0   Blood Glucose Monitoring Suppl (ACCU-CHEK GUIDE ME) w/Device KIT Use to check TID. 1 kit 0   calcium carbonate (OS-CAL) 1250 (500 Ca) MG chewable tablet Chew 1 tablet by mouth 3 (three) times daily.     Cholecalciferol (VITAMIN D3) 10 MCG (400 UNIT) tablet Take 1 tablet (400 Units total) by mouth daily. 90 tablet 1   dorzolamide-timolol (COSOPT) 22.3-6.8 MG/ML ophthalmic solution Place 1  drop into both eyes 2 (two) times daily 40 mL 3   empagliflozin (JARDIANCE) 10 MG TABS tablet TAKE 1 TABLET (10 MG TOTAL) BY MOUTH DAILY BEFORE BREAKFAST. 60 tablet 6   Travoprost, BAK Free, (TRAVATAN) 0.004 % SOLN ophthalmic solution Place 1 drop into both eyes nightly 2.5 mL 6   Accu-Chek Softclix Lancets lancets USE AS INSTRUCTED USE TO CHECK 3 TIMES DAILY (Patient taking differently: USE AS INSTRUCTED USE TO CHECK 3 TIMES DAILY) 100 each 2   atorvastatin (LIPITOR) 20 MG tablet Take 1 tablet (20 mg total) by mouth daily. 30 tablet 2   brimonidine (ALPHAGAN) 0.2 % ophthalmic solution Place 1 drop into the right eye 2 (two) times daily 5 mL 12   calcitRIOL (ROCALTROL) 0.25 MCG capsule Take 2 capsules (0.5 mcg total) by mouth  daily. 60 capsule 11   glucose blood test strip USE TO CHECK 3 TIMES DAILY (Patient taking differently: USE TO CHECK 3 TIMES DAILY) 100 strip 2   HYDROcodone-acetaminophen (NORCO/VICODIN) 5-325 MG tablet Take 1-2 tablets by mouth every 6 (six) hours as needed for moderate pain (pain score 4-6). 30 tablet 0   levothyroxine (SYNTHROID) 88 MCG tablet Take 1 tablet (88 mcg total) by mouth daily. 90 tablet 3   metFORMIN (GLUCOPHAGE) 850 MG tablet Take 1 tablet (850 mg total) by mouth 2 (two) times daily with a meal. 60 tablet 11   omeprazole (PRILOSEC) 40 MG capsule Take 1 capsule (40 mg total) by mouth daily. 30 capsule 3   No facility-administered medications prior to visit.    Allergies  Allergen Reactions   Penicillins Rash    Patient was placed on a form of penicillin by her dentist and developed a rash therefore medication was changed to clindamycin.  Per son, patient is not allergic to clindamycin    ROS Review of Systems  Constitutional:  Negative for chills, diaphoresis and fever.  HENT:  Positive for ear pain. Negative for congestion, ear discharge, hearing loss, nosebleeds, sore throat and tinnitus.   Eyes:  Negative for photophobia and discharge.  Respiratory:  Negative for cough, shortness of breath, wheezing and stridor.        No excess mucus  Cardiovascular:  Negative for chest pain, palpitations and leg swelling.  Gastrointestinal:  Negative for abdominal pain, blood in stool, constipation, diarrhea, nausea and vomiting.  Endocrine: Negative for polydipsia.  Genitourinary:  Negative for dysuria, flank pain, frequency, hematuria and urgency.  Musculoskeletal:  Negative for back pain, myalgias and neck pain.       RIGHT HIP PAIN  Skin:  Negative for rash.  Allergic/Immunologic: Negative for environmental allergies.  Neurological:  Positive for numbness. Negative for dizziness, tremors, seizures, weakness and headaches.  Hematological:  Does not bruise/bleed easily.   Psychiatric/Behavioral:  Negative for hallucinations and suicidal ideas. The patient is not nervous/anxious.   All other systems reviewed and are negative.    Objective:    Physical Exam Vitals reviewed.  Constitutional:      Appearance: Normal appearance. She is well-developed. She is not diaphoretic.  HENT:     Head: Normocephalic and atraumatic.     Right Ear: Tympanic membrane and ear canal normal. There is no impacted cerumen.     Left Ear: Tympanic membrane and ear canal normal. There is no impacted cerumen.     Nose: Nose normal. No nasal deformity, septal deviation, mucosal edema or rhinorrhea.     Right Sinus: No maxillary sinus tenderness or frontal sinus tenderness.  Left Sinus: No maxillary sinus tenderness or frontal sinus tenderness.     Mouth/Throat:     Mouth: Mucous membranes are moist.     Pharynx: No oropharyngeal exudate or posterior oropharyngeal erythema.  Eyes:     General: No scleral icterus.    Conjunctiva/sclera: Conjunctivae normal.     Pupils: Pupils are equal, round, and reactive to light.  Neck:     Thyroid: No thyromegaly.     Vascular: No carotid bruit or JVD.     Trachea: Trachea normal. No tracheal tenderness or tracheal deviation.  Cardiovascular:     Rate and Rhythm: Normal rate and regular rhythm.     Chest Wall: PMI is not displaced.     Pulses: Normal pulses. No decreased pulses.     Heart sounds: Normal heart sounds, S1 normal and S2 normal. Heart sounds not distant. No murmur heard. No systolic murmur is present.  No diastolic murmur is present.    No friction rub. No gallop. No S3 or S4 sounds.  Pulmonary:     Effort: No tachypnea, accessory muscle usage or respiratory distress.     Breath sounds: No stridor. No decreased breath sounds, wheezing, rhonchi or rales.  Chest:     Chest wall: No tenderness.  Abdominal:     General: Bowel sounds are normal. There is no distension.     Palpations: Abdomen is soft. Abdomen is not  rigid.     Tenderness: There is no abdominal tenderness. There is no guarding or rebound.  Musculoskeletal:        General: Normal range of motion.     Cervical back: Normal range of motion and neck supple. No edema, erythema or rigidity. No muscular tenderness. Normal range of motion.  Lymphadenopathy:     Head:     Right side of head: No submental or submandibular adenopathy.     Left side of head: No submental or submandibular adenopathy.     Cervical: No cervical adenopathy.  Skin:    General: Skin is warm and dry.     Coloration: Skin is not pale.     Findings: Rash present.     Nails: There is no clubbing.     Comments: Evidence of tinea pedis both feet underneath all small toes  Neurological:     Mental Status: She is alert and oriented to person, place, and time.     Sensory: No sensory deficit.  Psychiatric:        Speech: Speech normal.        Behavior: Behavior normal.    BP 137/80   Pulse 80   Resp 16   Wt 147 lb (66.7 kg)   SpO2 98%   BMI 23.73 kg/m  Wt Readings from Last 3 Encounters:  09/14/20 147 lb (66.7 kg)  07/13/20 150 lb (68 kg)  07/07/20 147 lb (66.7 kg)     Health Maintenance Due  Topic Date Due   COLON CANCER SCREENING ANNUAL FOBT  Never done   MAMMOGRAM  Never done   URINE MICROALBUMIN  04/18/2017   COVID-19 Vaccine (3 - Pfizer risk series) 04/16/2019    There are no preventive care reminders to display for this patient.  Lab Results  Component Value Date   TSH 0.31 (L) 05/29/2020   Lab Results  Component Value Date   WBC 9.0 06/24/2020   HGB 10.4 (L) 06/24/2020   HCT 31.7 (L) 06/24/2020   MCV 92.4 06/24/2020   PLT 183 06/24/2020  Lab Results  Component Value Date   NA 139 07/13/2020   K 4.6 07/13/2020   CO2 24 07/13/2020   GLUCOSE 134 (H) 07/13/2020   BUN 23 07/13/2020   CREATININE 1.01 (H) 07/13/2020   BILITOT 0.6 04/17/2019   ALKPHOS 94 04/17/2019   AST 40 04/17/2019   ALT 38 (H) 04/17/2019   PROT 6.8 04/17/2019    ALBUMIN 3.6 05/29/2020   CALCIUM 9.4 07/13/2020   ANIONGAP 11 06/24/2020   EGFR 60 07/13/2020   GFR 60.54 11/22/2019   Lab Results  Component Value Date   CHOL 138 04/17/2019   Lab Results  Component Value Date   HDL 68 04/17/2019   Lab Results  Component Value Date   LDLCALC 59 04/17/2019   Lab Results  Component Value Date   TRIG 50 04/17/2019   Lab Results  Component Value Date   CHOLHDL 2.0 04/17/2019   Lab Results  Component Value Date   HGBA1C 7.4 (H) 06/22/2020      Assessment & Plan:   Problem List Items Addressed This Visit       Cardiovascular and Mediastinum   Essential hypertension    Blood pressure well controlled no changes made      Relevant Medications   atorvastatin (LIPITOR) 20 MG tablet     Endocrine   Type 2 diabetes mellitus without complication, without long-term current use of insulin ([facility]) - Primary    Diabetes well controlled at this time no changes      Relevant Medications   metFORMIN (GLUCOPHAGE) 850 MG tablet   atorvastatin (LIPITOR) 20 MG tablet   Other Relevant Orders   Microalbumin / creatinine urine ratio   POCT glucose (manual entry) (Completed)   Post-surgical hypoparathyroidism ([facility]CC)    Continue oral calcitriol and calcium supplement      Acquired hypothyroidism    Continue Synthroid      Relevant Medications   levothyroxine (SYNTHROID) 88 MCG tablet   Hyperlipidemia associated with type 2 diabetes mellitus ([facility])    Continue cholesterol medication      Relevant Medications   metFORMIN (GLUCOPHAGE) 850 MG tablet   atorvastatin (LIPITOR) 20 MG tablet     Musculoskeletal and Integument   Hammer toes of both feet    Hammertoes in both feet with tinea pedis apply topical antifungal        Other   Status post total replacement of right hip    Patient to follow through with physical therapy      Other Visit Diagnoses     Encounter for screening mammogram for malignant neoplasm of breast        Relevant Orders   MM DIGITAL SCREENING BILATERAL   Dysuria       Relevant Orders   Urinalysis   Need for immunization against influenza       Relevant Orders   Flu Vaccine QUAD 17moIM (Fluarix, Fluzone & Alfiuria Quad PF) (Completed)       Meds ordered this encounter  Medications   calcitRIOL (ROCALTROL) 0.25 MCG capsule    Sig: Take 2 capsules (0.5 mcg total) by mouth daily.    Dispense:  60 capsule    Refill:  11   levothyroxine (SYNTHROID) 88 MCG tablet    Sig: Take 1 tablet (88 mcg total) by mouth daily.    Dispense:  90 tablet    Refill:  3   metFORMIN (GLUCOPHAGE) 850 MG tablet    Sig: Take 1 tablet (850 mg total) by  mouth 2 (two) times daily with a meal.    Dispense:  60 tablet    Refill:  11   omeprazole (PRILOSEC) 40 MG capsule    Sig: Take 1 capsule (40 mg total) by mouth daily.    Dispense:  30 capsule    Refill:  3   atorvastatin (LIPITOR) 20 MG tablet    Sig: Take 1 tablet (20 mg total) by mouth daily.    Dispense:  30 tablet    Refill:  2   Accu-Chek Softclix Lancets lancets    Sig: USE AS INSTRUCTED USE TO CHECK 3 TIMES DAILY    Dispense:  100 each    Refill:  2   glucose blood test strip    Sig: USE TO CHECK 3 TIMES DAILY    Dispense:  100 strip    Refill:  2   clotrimazole (LOTRIMIN) 1 % cream    Sig: Apply 1 application topically 2 (two) times daily. To both feet at base of toes    Dispense:  30 g    Refill:  0    Follow-up: Return in about 4 months (around 01/14/2021).    Asencion Noble, MD

## 2020-09-14 ENCOUNTER — Encounter: Payer: Self-pay | Admitting: Critical Care Medicine

## 2020-09-14 ENCOUNTER — Ambulatory Visit: Payer: Medicaid Other | Attending: Critical Care Medicine | Admitting: Critical Care Medicine

## 2020-09-14 ENCOUNTER — Other Ambulatory Visit: Payer: Self-pay

## 2020-09-14 VITALS — BP 137/80 | HR 80 | Resp 16 | Wt 147.0 lb

## 2020-09-14 DIAGNOSIS — E119 Type 2 diabetes mellitus without complications: Secondary | ICD-10-CM | POA: Diagnosis not present

## 2020-09-14 DIAGNOSIS — E1169 Type 2 diabetes mellitus with other specified complication: Secondary | ICD-10-CM | POA: Diagnosis not present

## 2020-09-14 DIAGNOSIS — M2041 Other hammer toe(s) (acquired), right foot: Secondary | ICD-10-CM | POA: Diagnosis not present

## 2020-09-14 DIAGNOSIS — R200 Anesthesia of skin: Secondary | ICD-10-CM | POA: Diagnosis not present

## 2020-09-14 DIAGNOSIS — Z7984 Long term (current) use of oral hypoglycemic drugs: Secondary | ICD-10-CM | POA: Diagnosis not present

## 2020-09-14 DIAGNOSIS — R300 Dysuria: Secondary | ICD-10-CM | POA: Insufficient documentation

## 2020-09-14 DIAGNOSIS — E892 Postprocedural hypoparathyroidism: Secondary | ICD-10-CM | POA: Insufficient documentation

## 2020-09-14 DIAGNOSIS — Z96641 Presence of right artificial hip joint: Secondary | ICD-10-CM

## 2020-09-14 DIAGNOSIS — Z79899 Other long term (current) drug therapy: Secondary | ICD-10-CM | POA: Insufficient documentation

## 2020-09-14 DIAGNOSIS — I10 Essential (primary) hypertension: Secondary | ICD-10-CM | POA: Diagnosis not present

## 2020-09-14 DIAGNOSIS — Z23 Encounter for immunization: Secondary | ICD-10-CM | POA: Insufficient documentation

## 2020-09-14 DIAGNOSIS — Z1231 Encounter for screening mammogram for malignant neoplasm of breast: Secondary | ICD-10-CM | POA: Insufficient documentation

## 2020-09-14 DIAGNOSIS — E785 Hyperlipidemia, unspecified: Secondary | ICD-10-CM

## 2020-09-14 DIAGNOSIS — M2042 Other hammer toe(s) (acquired), left foot: Secondary | ICD-10-CM

## 2020-09-14 DIAGNOSIS — E039 Hypothyroidism, unspecified: Secondary | ICD-10-CM | POA: Diagnosis not present

## 2020-09-14 LAB — GLUCOSE, POCT (MANUAL RESULT ENTRY): POC Glucose: 158 mg/dl — AB (ref 70–99)

## 2020-09-14 MED ORDER — ATORVASTATIN CALCIUM 20 MG PO TABS
20.0000 mg | ORAL_TABLET | Freq: Every day | ORAL | 2 refills | Status: DC
Start: 2020-09-14 — End: 2020-11-30

## 2020-09-14 MED ORDER — CLOTRIMAZOLE 1 % EX CREA: 1.0000 "application " | TOPICAL_CREAM | Freq: Two times a day (BID) | CUTANEOUS | 0 refills | Status: DC

## 2020-09-14 MED ORDER — GLUCOSE BLOOD VI STRP: ORAL_STRIP | 2 refills | Status: AC

## 2020-09-14 MED ORDER — CALCITRIOL 0.25 MCG PO CAPS: 0.5000 ug | ORAL_CAPSULE | Freq: Every day | ORAL | 11 refills | Status: DC

## 2020-09-14 MED ORDER — OMEPRAZOLE 40 MG PO CPDR: 40.0000 mg | DELAYED_RELEASE_CAPSULE | Freq: Every day | ORAL | 3 refills | Status: DC

## 2020-09-14 MED ORDER — ACCU-CHEK SOFTCLIX LANCETS MISC: 2 refills | Status: AC

## 2020-09-14 MED ORDER — METFORMIN HCL 850 MG PO TABS: 850.0000 mg | ORAL_TABLET | Freq: Two times a day (BID) | ORAL | 11 refills | Status: DC

## 2020-09-14 MED ORDER — LEVOTHYROXINE SODIUM 88 MCG PO TABS: 88.0000 ug | ORAL_TABLET | Freq: Every day | ORAL | 3 refills | Status: DC

## 2020-09-14 NOTE — Assessment & Plan Note (Signed)
Continue Synthroid °

## 2020-09-14 NOTE — Assessment & Plan Note (Signed)
Continue oral calcitriol and calcium supplement

## 2020-09-14 NOTE — Patient Instructions (Signed)
Please process the stool kit for colon cancer screening and bring it back to the lab  A mammogram will be ordered  Please attend your physical therapy appointments regarding your right hip pain  Antifungal cream sent to your pharmacy for the foot fungus  No change in medications refills on all medicines [facility]  Urinalysis will be obtained today along with a urine for protein  Return to Dr. Joya Gaskins 4 months

## 2020-09-14 NOTE — Assessment & Plan Note (Signed)
Hammertoes in both feet with tinea pedis apply topical antifungal

## 2020-09-14 NOTE — Assessment & Plan Note (Signed)
Continue cholesterol medication 

## 2020-09-14 NOTE — Assessment & Plan Note (Signed)
Diabetes well controlled at this time no changes

## 2020-09-14 NOTE — Progress Notes (Signed)
Pt states she is having some discomfort in her right hip  Pt states she is having numbness in b/l feet   Pt son is requesting all medications [facility] be [facility] [address]

## 2020-09-14 NOTE — Assessment & Plan Note (Signed)
Blood pressure well controlled no changes made ?

## 2020-09-14 NOTE — Assessment & Plan Note (Signed)
Patient to follow through with physical therapy

## 2020-09-15 LAB — URINALYSIS
Bilirubin, UA: NEGATIVE
Ketones, UA: NEGATIVE
Leukocytes,UA: NEGATIVE
Nitrite, UA: NEGATIVE
Protein,UA: NEGATIVE
RBC, UA: NEGATIVE
Specific Gravity, UA: 1.021 (ref 1.005–1.030)
Urobilinogen, Ur: 0.2 mg/dL (ref 0.2–1.0)
pH, UA: 6.5 (ref 5.0–7.5)

## 2020-09-15 LAB — MICROALBUMIN / CREATININE URINE RATIO
Creatinine, Urine: 34.1 mg/dL
Microalb/Creat Ratio: 44 mg/g creat — ABNORMAL HIGH (ref 0–29)
Microalbumin, Urine: 15 ug/mL

## 2020-09-16 ENCOUNTER — Telehealth: Payer: Self-pay

## 2020-09-16 NOTE — Telephone Encounter (Signed)
Contacted pt son to go over lab results. Pt son is aware and doesn't have any questions or concerns

## 2020-09-17 ENCOUNTER — Other Ambulatory Visit: Payer: Self-pay

## 2020-09-17 ENCOUNTER — Encounter: Payer: Self-pay | Admitting: Physical Therapy

## 2020-09-17 ENCOUNTER — Ambulatory Visit: Payer: Medicare Other | Attending: Orthopaedic Surgery | Admitting: Physical Therapy

## 2020-09-17 DIAGNOSIS — G8929 Other chronic pain: Secondary | ICD-10-CM | POA: Diagnosis present

## 2020-09-17 DIAGNOSIS — R2689 Other abnormalities of gait and mobility: Secondary | ICD-10-CM | POA: Insufficient documentation

## 2020-09-17 DIAGNOSIS — R293 Abnormal posture: Secondary | ICD-10-CM | POA: Diagnosis present

## 2020-09-17 DIAGNOSIS — M25551 Pain in right hip: Secondary | ICD-10-CM | POA: Insufficient documentation

## 2020-09-17 DIAGNOSIS — M545 Low back pain, unspecified: Secondary | ICD-10-CM | POA: Diagnosis not present

## 2020-09-17 DIAGNOSIS — M6281 Muscle weakness (generalized): Secondary | ICD-10-CM | POA: Insufficient documentation

## 2020-09-17 NOTE — Therapy (Signed)
[facility] Outpatient Rehabilitation [facility] [address] [place], [place], [address] Phone: [phone]   Fax:  [phone]  Physical Therapy Evaluation  Patient Details  Name: Norma Clay MRN: [id number] Date of Birth: Sep 07, 1950 Referring Provider (PT): Dr. Doneen Poisson   Encounter Date: 09/17/2020   PT End of Session - 09/17/20 1504     Visit Number 1    Number of Visits 12    Date for PT Re-Evaluation 10/29/20    Authorization Type [employer]    PT Start Time 1235    PT Stop Time 1315    PT Time Calculation (min) 40 min             Past Medical History:  Diagnosis Date   Diabetes mellitus without complication (HCC)    Glaucoma    Hypertension    Osteoarthritis of right hip 05/16/2016   Posterior capsular opacification of both eyes, 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   Primary osteoarthritis of right hip 05/06/2020    Past Surgical History:  Procedure Laterality Date   ABDOMINAL HYSTERECTOMY     GLAUCOMA SURGERY     THYROIDECTOMY     TOTAL HIP ARTHROPLASTY Right 06/23/2020   Procedure: RIGHT TOTAL HIP ARTHROPLASTY ANTERIOR APPROACH;  Surgeon: Kathryne Hitch, MD;  Location: [facility] OR;  Service: Orthopedics;  Laterality: Right;    There were no vitals filed for this visit.    Subjective Assessment - 09/17/20 1237     Subjective This patient underwent THR 06/23/20 by Dr. Magnus Ivan.  "The surgery helped me so much". She has back pain and occasional hip pain that limits her walking.  Back pain > 5 yrs., pain when she walks and lays down.  Her son wants her to stand up straight.  She had HHPT. for several visits (2 mos.) as well as in 2021 for back pain .  She states she feels about how she did her last episode.    Patient is accompained by: Interpreter    Pertinent History THR anterior approach, Diabetes, OA, back pain with PT 1 yr ago    Limitations Lifting;Standing;Walking;House hold activities    How long can you stand comfortably? can  do 30 min some days in the home    Diagnostic tests --    Patient Stated Goals To walk better get her back straight.    Currently in Pain? Yes    Pain Score 0-No pain    Pain Location Hip    Pain Orientation Right    Pain Descriptors / Indicators Aching    Pain Type Surgical pain;Chronic pain    Pain Radiating Towards Rt back to Rt hip    Pain Onset More than a month ago    Pain Frequency Intermittent    Aggravating Factors  walking    Pain Relieving Factors not sure , rest.    Multiple Pain Sites Yes    Pain Score 7    Pain Location Back    Pain Orientation Posterior;Mid;Lower;Right;Left    Pain Descriptors / Indicators Aching    Pain Type Chronic pain    Pain Radiating Towards Rt hip    Pain Onset More than a month ago    Pain Frequency Constant    Aggravating Factors  standing, walking , laying    Pain Relieving Factors massage    Effect of Pain on Daily Activities has made her fall 2 yrs ago                [facility]  PT Assessment - 09/17/20 0001       Assessment   Medical Diagnosis Rt THR , back pain    Referring Provider (PT) Dr. Doneen Poisson    Onset Date/Surgical Date 06/15/20    Prior Therapy Yes 1 yr      Precautions   Precautions Anterior Hip      Restrictions   Weight Bearing Restrictions No      Balance Screen   Has the patient fallen in the past 6 months Yes    How many times? 2    Has the patient had a decrease in activity level because of a fear of falling?  Yes    Is the patient reluctant to leave their home because of a fear of falling?  Yes      Home Environment   Living Environment Private residence    Type of Home House    Home Access Stairs to enter    Home Equipment [place] - quad    Additional Comments --      Prior Function   Level of Independence Independent with household mobility with device;Independent with community mobility with device    Vocation --      Cognition   Overall Cognitive Status Within Functional Limits for  tasks assessed      Observation/Other Assessments   Focus on Therapeutic Outcomes (FOTO)  NT language      Sensation   Light Touch Impaired by gross assessment    Additional Comments feet/toes Rt > L.      Posture/Postural Control   Posture/Postural Control Postural limitations      AROM   Overall AROM Comments trunk flexion WNL, extension limited to lacking neutral by about 20 deg.  Rotation 50% or more..  Knees limited in  flexion as well, not measured      PROM   Overall PROM Comments Rt hip ER/IR about 20-25 deg , hip flex to 100 degm full PROM on L.t hip      Strength   Right Hip Flexion 4+/5    Left Hip Flexion 4+/5    Right Knee Flexion 4+/5    Right Knee Extension 4+/5    Left Knee Flexion 5/5    Left Knee Extension 5/5      Palpation   Palpation comment pain throughout grossly to middle and lower back      Special Tests   Other special tests neg SLR  but full L hamstring stretch increases pain on contralateral post hip, back      Bed Mobility   Bed Mobility --   mod I     Transfers   Five time sit to stand comments  18.58 sec used hands from standard chair      Ambulation/Gait   Assistive device Small based quad cane    Gait Pattern Antalgic;Decreased trunk rotation;Trunk flexed                        Objective measurements completed on examination: See above findings.                PT Education - 09/17/20 1504     Education Details PT/POC, posture, mobiity general today due to time constraints    Person(s) Educated Patient    Methods Explanation;Demonstration    Comprehension Verbalized understanding;Verbal cues required;Tactile cues required;Need further instruction                 PT Long Term  Goals - 09/17/20 1505       PT LONG TERM GOAL #1   Title Pt will be I and compliant with long term HEP for progression and maintenance of condition.    Baseline reprorts doing her home health HEP for hip    Time 6     Period Weeks    Status New    Target Date 10/29/20      PT LONG TERM GOAL #2   Title Pt will be able to perform 5 x STS without hands in < 30 sec    Baseline 38 sec unable to do x 1 without hands    Time 8    Period Weeks    Status New    Target Date 10/29/20      PT LONG TERM GOAL #3   Title Pt will complete Berg Balance test, TBA depending on iniital score for decreased fall risk.    Baseline unable to do on eval    Time 6    Period Weeks    Status New      PT LONG TERM GOAL #4   Title Pt will be able to stand for home tasks 10-15 min  with pain in back and hip < 4/10 for periods of 30 min    Baseline pain can be 7/10 or more    Time 6    Period Weeks    Status New    Target Date 10/29/20      PT LONG TERM GOAL #5   Title Pt will reach arms overhead and achieve neutral spine in standing (extension) with min pain < 2/10    Baseline lacks about 20 deg ext in spine and pain in  bilateral UEs with overhead reach    Time 6    Period Weeks    Status New                    Plan - 09/17/20 1511     Clinical Impression Statement Patient presents for mod complexity eval of bilateral lumbar pain in addition to Rt hip pain ongoing since her surgery.  She had a successful Rt THR and has min pain overall related to that. The reduced mobility has likely played a role in lumbar exacerbation along with an abnormal gait pattern.  Time was short today due to time spent with interpretation as well as lengthy replies to PT history and assessment.  Will further assess balance and functional mobility at the next visit as well as provde an updated HEP.  She had had PT visits this year, so we will need to check the remaining number of visits for the year.    Personal Factors and Comorbidities Age;Past/Current Experience;Comorbidity 3+;Social Background    Comorbidities Diabetes, OA, chronic back pain    Examination-Activity Limitations Squat;Bed Mobility;Lift;Locomotion  Level;Stand;Stairs;Reach Overhead;Transfers    Examination-Participation Restrictions Cleaning;Shop;Community Activity;Interpersonal Relationship;Yard Work;Meal Prep    Stability/Clinical Decision Making Evolving/Moderate complexity    Rehab Potential Good    PT Frequency 2x / week    PT Duration 6 weeks    PT Treatment/Interventions ADLs/Self Care Home Management;Cryotherapy;Moist Heat;Gait training;Stair training;Functional mobility training;Manual techniques;Balance training;Therapeutic exercise;Patient/family education    PT Next Visit Plan HEP. Berg, posture and gait    PT Home Exercise Plan none current has HHPT             Patient will benefit from skilled therapeutic intervention in order to improve the following deficits and impairments:  Abnormal  gait, Increased fascial restricitons, Pain, Improper body mechanics, Postural dysfunction, Decreased mobility, Decreased range of motion, Decreased endurance, Decreased strength, Hypomobility, Impaired UE functional use, Impaired flexibility, Decreased balance, Difficulty walking, Increased edema  Visit Diagnosis: Chronic bilateral low back pain without sciatica  Pain in right hip  Muscle weakness (generalized)  Abnormal posture  Other abnormalities of gait and mobility     Problem List Patient Active Problem List   Diagnosis Date Noted   Status post total replacement of right hip 06/23/2020   Diabetic neuropathy (HCC) 04/13/2020   Acquired hypothyroidism 12/16/2019   Hyperlipidemia associated with type 2 diabetes mellitus (HCC) 12/16/2019   Hammer toes of both feet 12/16/2019   Degenerative arthritis of lumbar spine 12/16/2019   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   Essential hypertension 02/19/2019   Postsurgical states following surgery of eye and adnexa 10/01/2018   Pseudophakia of both eyes 04/23/2018   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 [place], [place], [address] Phone: [phone]   Fax:  [phone]  Name: Norma Clay MRN: [id number] Date of Birth: 01-25-1950  Karie Mainland, PT 09/17/20 3:25 PM Phone: [phone] Fax: [phone]   [employer] Authorization   Choose one: Rehabilitative  Standardized Assessment or Functional Outcome Tool: See Pain Assessment and 5x sit to stand  Score: unable to do without UEs, 38 sec.  Body Parts Treated (Select each separately):  Lumbopelvic. Overall deficits/functional limitations for body part selected: severe Other hip  . Overall deficits/functional limitations for body part selected: mild

## 2020-09-21 DIAGNOSIS — H209 Unspecified iridocyclitis: Secondary | ICD-10-CM | POA: Insufficient documentation

## 2020-09-23 ENCOUNTER — Other Ambulatory Visit: Payer: Self-pay

## 2020-09-23 ENCOUNTER — Ambulatory Visit: Payer: Medicare Other | Admitting: Physical Therapy

## 2020-09-23 DIAGNOSIS — M545 Low back pain, unspecified: Secondary | ICD-10-CM

## 2020-09-23 DIAGNOSIS — M25551 Pain in right hip: Secondary | ICD-10-CM

## 2020-09-23 DIAGNOSIS — G8929 Other chronic pain: Secondary | ICD-10-CM

## 2020-09-23 DIAGNOSIS — M6281 Muscle weakness (generalized): Secondary | ICD-10-CM

## 2020-09-23 DIAGNOSIS — R2689 Other abnormalities of gait and mobility: Secondary | ICD-10-CM

## 2020-09-23 DIAGNOSIS — R293 Abnormal posture: Secondary | ICD-10-CM

## 2020-09-23 NOTE — Patient Instructions (Signed)
Access Code [email or web address] Access Code: [email or web address] URL: [email or web address] Date: 09/23/2020 Prepared by: Karie Mainland  Exercises Supine Lower Trunk Rotation - 1-2 x daily - 7 x weekly - 2 sets - 10 reps - 15 hold Supine Bridge with Resistance Band - 1 x daily - 7 x weekly - 2 sets - 10 reps - 5 hold Hooklying Clamshell with Resistance - 1 x daily - 7 x weekly - 2 sets - 10 reps - 5 hold Supine Single Knee to Chest Stretch - 1 x daily - 7 x weekly - 1 sets - 5 reps - 30 hold Standing Marching - 1 x daily - 7 x weekly - 2 sets - 10 reps - 5 hold Single Leg Stance with Support - 1 x daily - 7 x weekly - 1 sets - 5 reps - 10-30 hold Sit to Stand Without Arm Support - 1 x daily - 7 x weekly - 2 sets - 10 reps - 5 hold

## 2020-09-23 NOTE — Therapy (Signed)
[facility] Outpatient Rehabilitation [facility] [address] [place], [place], [address] Phone: [phone]   Fax:  [phone]  Physical Therapy Treatment  Patient Details  Name: Norma Clay MRN: [id number] Date of Birth: [birth year] Referring Provider (PT): Dr. Doneen Poisson   Encounter Date: 09/23/2020   PT End of Session - 09/23/20 1112     Visit Number 2    Number of Visits 12    Date for PT Re-Evaluation 10/29/20    Authorization Type [employer]    PT Start Time 1105    PT Stop Time 1146    PT Time Calculation (min) 41 min    Activity Tolerance Patient tolerated treatment well    Behavior During Therapy [facility] for tasks assessed/performed             Past Medical History:  Diagnosis Date   Diabetes mellitus without complication (HCC)    Glaucoma    Hypertension    Osteoarthritis of right hip 05/16/2016   Posterior capsular opacification of both eyes, 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   Primary osteoarthritis of right hip 05/06/2020    Past Surgical History:  Procedure Laterality Date   ABDOMINAL HYSTERECTOMY     GLAUCOMA SURGERY     THYROIDECTOMY     TOTAL HIP ARTHROPLASTY Right 06/23/2020   Procedure: RIGHT TOTAL HIP ARTHROPLASTY ANTERIOR APPROACH;  Surgeon: Kathryne Hitch, MD;  Location: [facility] OR;  Service: Orthopedics;  Laterality: Right;    There were no vitals filed for this visit.   Subjective Assessment - 09/23/20 1108     Subjective Patient brings her HEP from HHPT today.    Patient is accompained by: Interpreter    Currently in Pain? Yes    Pain Score 5     Pain Location Hip    Pain Orientation Right    Pain Descriptors / Indicators Aching    Pain Type Chronic pain    Pain Onset More than a month ago    Pain Frequency Intermittent    Aggravating Factors  walking    Pain Relieving Factors not sure    Pain Score 5    Pain Location Back    Pain Orientation Lower;Mid;Upper    Pain Descriptors / Indicators Aching;Sore     Pain Type Chronic pain    Pain Onset More than a month ago    Aggravating Factors  standing up straight    Pain Relieving Factors massage                Skilled therapy interventions:  Therapeutic Exercise:  Sit to stand x 10 no UEs from mat table (sl. Elevated)   High knees march x 10 with 1 HHA   Stand on Rt LE , <5 sec   Hip abduction Rt LE x 10 poor technique (tight ant hip)   Heel raises x 15   Mini squat x 10, mod cues for technique  Supine knee to chest x multiple reps cues Bridging x 2 x 10 (green band)  Clam x 20 Lower trunk rotation x 10 , x 10 sec  Attempted to lean on wall for supported wall slides but too fearful despite cues and encouragement.    Supine trunk rotation x 10 cues  Self care/Pt. Education:    HEP , rationale for stretching, cue for posture throughout       PT Education - 09/23/20 1154     Education Details posture, HEP    Person(s) Educated Patient    Methods Explanation  Comprehension Verbalized understanding                 PT Long Term Goals - 09/17/20 1505       PT LONG TERM GOAL #1   Title Pt will be I and compliant with long term HEP for progression and maintenance of condition.    Baseline reprorts doing her home health HEP for hip    Time 6    Period Weeks    Status New    Target Date 10/29/20      PT LONG TERM GOAL #2   Title Pt will be able to perform 5 x STS without hands in < 30 sec    Baseline 38 sec unable to do x 1 without hands    Time 8    Period Weeks    Status New    Target Date 10/29/20      PT LONG TERM GOAL #3   Title Pt will complete Berg Balance test, TBA depending on iniital score for decreased fall risk.    Baseline unable to do on eval    Time 6    Period Weeks    Status New      PT LONG TERM GOAL #4   Title Pt will be able to stand for home tasks 10-15 min  with pain in back and hip < 4/10 for periods of 30 min    Baseline pain can be 7/10 or more    Time 6    Period Weeks     Status New    Target Date 10/29/20      PT LONG TERM GOAL #5   Title Pt will reach arms overhead and achieve neutral spine in standing (extension) with min pain < 2/10    Baseline lacks about 20 deg ext in spine and pain in  bilateral UEs with overhead reach    Time 6    Period Weeks    Status New                   Plan - 09/23/20 1155     Clinical Impression Statement Pt fearful of falling when in standing against the wall. Trunk flexion is due to weakness but fear is a component as well.  Upgrade to HE today focusing on increasing hip mobility and strength, light core rotation.  Unclear if she felt better after session, walked out of clinic without a limp.    PT Treatment/Interventions ADLs/Self Care Home Management;Cryotherapy;Moist Heat;Gait training;Stair training;Functional mobility training;Manual techniques;Balance training;Therapeutic exercise;Patient/family education    PT Next Visit Plan Berg, posture and gait    PT Home Exercise Plan Access Code: [email or web address]  URL: [email or web address]  Date: 09/23/2020  Prepared by: Karie Mainland    Exercises  Supine Lower Trunk Rotation - 1-2 x daily - 7 x weekly - 2 sets - 10 reps - 15 hold  Supine Bridge with Resistance Band - 1 x daily - 7 x weekly - 2 sets - 10 reps - 5 hold  Hooklying Clamshell with Resistance - 1 x daily - 7 x weekly - 2 sets - 10 reps - 5 hold  Supine Single Knee to Chest Stretch - 1 x daily - 7 x weekly - 1 sets - 5 reps - 30 hold  Standing Marching - 1 x daily - 7 x weekly - 2 sets - 10 reps - 5 hold  Single Leg Stance with Support - 1 x daily - 7 x weekly -  1 sets - 5 reps - 10-30 hold  Sit to Stand Without Arm Support - 1 x daily - 7 x weekly - 2 sets - 10 reps - 5 hold    Consulted and Agree with Plan of Care Patient             Patient will benefit from skilled therapeutic intervention in order to improve the following deficits and impairments:  Abnormal gait, Increased fascial restricitons,  Pain, Improper body mechanics, Postural dysfunction, Decreased mobility, Decreased range of motion, Decreased endurance, Decreased strength, Hypomobility, Impaired UE functional use, Impaired flexibility, Decreased balance, Difficulty walking, Increased edema  Visit Diagnosis: Chronic bilateral low back pain without sciatica  Pain in right hip  Muscle weakness (generalized)  Abnormal posture  Other abnormalities of gait and mobility     Problem List Patient Active Problem List   Diagnosis Date Noted   Status post total replacement of right hip 06/23/2020   Diabetic neuropathy (HCC) 04/13/2020   Acquired hypothyroidism 12/16/2019   Hyperlipidemia associated with type 2 diabetes mellitus (HCC) 12/16/2019   Hammer toes of both feet 12/16/2019   Degenerative arthritis of lumbar spine 12/16/2019   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   Essential hypertension 02/19/2019   Postsurgical states following surgery of eye and adnexa 10/01/2018   Pseudophakia of both eyes 04/23/2018   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 [place], [place], [address] Phone: [phone]   Fax:  [phone]  Name: Ketzia Guzek MRN: [id number] Date of Birth: July 31, 1950   Karie Mainland, PT 09/23/20 11:59 AM Phone: [phone] Fax: [phone]

## 2020-09-25 ENCOUNTER — Telehealth: Payer: Self-pay | Admitting: *Deleted

## 2020-09-25 ENCOUNTER — Other Ambulatory Visit: Payer: Self-pay | Admitting: Critical Care Medicine

## 2020-09-25 ENCOUNTER — Other Ambulatory Visit: Payer: Self-pay

## 2020-09-25 ENCOUNTER — Ambulatory Visit: Payer: Medicare Other | Admitting: Physical Therapy

## 2020-09-25 DIAGNOSIS — M545 Low back pain, unspecified: Secondary | ICD-10-CM | POA: Diagnosis not present

## 2020-09-25 DIAGNOSIS — R293 Abnormal posture: Secondary | ICD-10-CM

## 2020-09-25 DIAGNOSIS — M25551 Pain in right hip: Secondary | ICD-10-CM

## 2020-09-25 DIAGNOSIS — G8929 Other chronic pain: Secondary | ICD-10-CM

## 2020-09-25 DIAGNOSIS — R2689 Other abnormalities of gait and mobility: Secondary | ICD-10-CM

## 2020-09-25 DIAGNOSIS — M6281 Muscle weakness (generalized): Secondary | ICD-10-CM

## 2020-09-25 NOTE — Therapy (Signed)
[facility] Outpatient Rehabilitation [facility] [address] [place], [place], [address] Phone: [phone]   Fax:  [phone]  Physical Therapy Treatment  Patient Details  Name: Norma Clay MRN: [id number] Date of Birth: [birth year] Referring Provider (PT): Dr. Doneen Poisson   Encounter Date: 09/25/2020     Past Medical History:  Diagnosis Date   Diabetes mellitus without complication (HCC)    Glaucoma    Hypertension    Osteoarthritis of right hip 05/16/2016   Posterior capsular opacification of both eyes, 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   Primary osteoarthritis of right hip 05/06/2020    Past Surgical History:  Procedure Laterality Date   ABDOMINAL HYSTERECTOMY     GLAUCOMA SURGERY     THYROIDECTOMY     TOTAL HIP ARTHROPLASTY Right 06/23/2020   Procedure: RIGHT TOTAL HIP ARTHROPLASTY ANTERIOR APPROACH;  Surgeon: Kathryne Hitch, MD;  Location: [facility] OR;  Service: Orthopedics;  Laterality: Right;    There were no vitals filed for this visit.    Therapeutic exercise:  Nustep L5 UE and LE for 5 min  Standing at countertop  Partial squats 2 x 15  Standing heel raise x 20  Lateral walking , stepping 40 feet no UEs needed , close S by PT   Balance Narrow stance, tandem stance, briefly with light A from countertop   Sidelying leg work each leg, cues needed for form   Hip abduction x 15   Clam x 15   Supine SLR 2 x 10 Rt LE  Supine bridge x 2 x 10  Lower trunk bilateral x 10 slow, cues needed for this.         PT Long Term Goals - 09/17/20 1505       PT LONG TERM GOAL #1   Title Pt will be I and compliant with long term HEP for progression and maintenance of condition.    Baseline reprorts doing her home health HEP for hip    Time 6    Period Weeks    Status New    Target Date 10/29/20      PT LONG TERM GOAL #2   Title Pt will be able to perform 5 x STS without hands in < 30 sec    Baseline 38 sec unable to do x 1 without  hands    Time 8    Period Weeks    Status New    Target Date 10/29/20      PT LONG TERM GOAL #3   Title Pt will complete Berg Balance test, TBA depending on iniital score for decreased fall risk.    Baseline unable to do on eval    Time 6    Period Weeks    Status New      PT LONG TERM GOAL #4   Title Pt will be able to stand for home tasks 10-15 min  with pain in back and hip < 4/10 for periods of 30 min    Baseline pain can be 7/10 or more    Time 6    Period Weeks    Status New    Target Date 10/29/20      PT LONG TERM GOAL #5   Title Pt will reach arms overhead and achieve neutral spine in standing (extension) with min pain < 2/10    Baseline lacks about 20 deg ext in spine and pain in  bilateral UEs with overhead reach    Time 6    Period Weeks  Status New                     Patient will benefit from skilled therapeutic intervention in order to improve the following deficits and impairments:  Abnormal gait, Increased fascial restricitons, Pain, Improper body mechanics, Postural dysfunction, Decreased mobility, Decreased range of motion, Decreased endurance, Decreased strength, Hypomobility, Impaired UE functional use, Impaired flexibility, Decreased balance, Difficulty walking, Increased edema  Visit Diagnosis: Chronic bilateral low back pain without sciatica  Muscle weakness (generalized)  Pain in right hip  Abnormal posture  Other abnormalities of gait and mobility     Problem List Patient Active Problem List   Diagnosis Date Noted   Status post total replacement of right hip 06/23/2020   Diabetic neuropathy (HCC) 04/13/2020   Acquired hypothyroidism 12/16/2019   Hyperlipidemia associated with type 2 diabetes mellitus (HCC) 12/16/2019   Hammer toes of both feet 12/16/2019   Degenerative arthritis of lumbar spine 12/16/2019   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   Essential hypertension 02/19/2019   Postsurgical states  following surgery of eye and adnexa 10/01/2018   Pseudophakia of both eyes 04/23/2018   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 [place], [place], [address] Phone: [phone]   Fax:  [phone]  Name: Merida Alcantar MRN: [id number] Date of Birth: February 02, 1950   Karie Mainland, PT 09/28/20 8:20 AM Phone: [phone] Fax: [phone]

## 2020-09-25 NOTE — Telephone Encounter (Signed)
I called [facility] on [address] in [place], [place] because we got duplicate requests for the clotrimazole 1% cream and the omeprazole 40 mg.  It happened because of the way the pt reordered it that it created another request.  Pt has picked up both of these prescriptions.

## 2020-09-28 ENCOUNTER — Other Ambulatory Visit: Payer: Self-pay

## 2020-09-28 ENCOUNTER — Ambulatory Visit: Payer: Medicare Other | Admitting: Physical Therapy

## 2020-09-28 DIAGNOSIS — G8929 Other chronic pain: Secondary | ICD-10-CM

## 2020-09-28 DIAGNOSIS — R2689 Other abnormalities of gait and mobility: Secondary | ICD-10-CM

## 2020-09-28 DIAGNOSIS — M545 Low back pain, unspecified: Secondary | ICD-10-CM | POA: Diagnosis not present

## 2020-09-28 DIAGNOSIS — M6281 Muscle weakness (generalized): Secondary | ICD-10-CM

## 2020-09-28 DIAGNOSIS — M25551 Pain in right hip: Secondary | ICD-10-CM

## 2020-09-28 NOTE — Therapy (Signed)
[facility] Outpatient Rehabilitation [facility] [address] [place], [place], [address] Phone: [phone]   Fax:  [phone]  Physical Therapy Treatment  Patient Details  Name: Norma Clay MRN: [id number] Date of Birth: 1950-04-30 Referring Provider (PT): Dr. Doneen Poisson   Encounter Date: 09/28/2020   PT End of Session - 09/28/20 1111     Visit Number 4    Number of Visits 12    Date for PT Re-Evaluation 10/29/20    Authorization Type [employer] MCD    PT Start Time 1106   pt not late, time for interpreter i pad   PT Stop Time 1145    PT Time Calculation (min) 39 min    Activity Tolerance Patient tolerated treatment well    Behavior During Therapy [facility] for tasks assessed/performed             Past Medical History:  Diagnosis Date   Diabetes mellitus without complication (HCC)    Glaucoma    Hypertension    Osteoarthritis of right hip 05/16/2016   Posterior capsular opacification of both eyes, 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   Primary osteoarthritis of right hip 05/06/2020    Past Surgical History:  Procedure Laterality Date   ABDOMINAL HYSTERECTOMY     GLAUCOMA SURGERY     THYROIDECTOMY     TOTAL HIP ARTHROPLASTY Right 06/23/2020   Procedure: RIGHT TOTAL HIP ARTHROPLASTY ANTERIOR APPROACH;  Surgeon: Kathryne Hitch, MD;  [facility] OR;  Service: Orthopedics;  Laterality: Right;    There were no vitals filed for this visit.   Subjective Assessment - 09/28/20 1109     Subjective Pt here without complaints initially.  I'm OK. Before I cannot lift my leg.  I do have some pain but otherwise OK.    Pertinent History THR anterior approach, Diabetes, OA, back pain with PT 1 yr ago    Limitations Lifting;Standing;Walking;House hold activities    Currently in Pain? Yes    Pain Score 2     Pain Location Hip    Pain Orientation Right    Pain Descriptors / Indicators Aching    Pain Type Chronic pain;Surgical pain    Pain Radiating Towards  back and hip    Pain Onset More than a month ago    Pain Frequency Intermittent    Aggravating Factors  walking, standing    Pain Relieving Factors rest             Skilled therapy interventions:   Therapeutic Exercise:  Seated hip flexion green band x 15  Sit to stand green band at thighs x 15   Wall slides for spine extension x 10 Rt LE   Leaning on wall shoulder flexion and then abduction x 10   Quadruped rocking, unable to lower hips due to knee pain, able to lower head and chest. Needed physical A for safety for getting out of this position.    Supine SLR with 2 lbs 2 x 10 cue for knee ext.  Bridging x 15    Self care/Pt. Education:    Transferring to all fours for prayer vs childs pose    Assessing goals and difficulties with mobility    PT Long Term Goals - 09/28/20 1114       PT LONG TERM GOAL #1   Title Pt will be I and compliant with long term HEP for progression and maintenance of condition.    Baseline compliant thus far    Period Days    Status  On-going      PT LONG TERM GOAL #2   Title Pt will be able to perform 5 x STS without hands in < 30 sec    Status On-going      PT LONG TERM GOAL #3   Title Pt will complete Berg Balance test, TBA depending on iniital score for decreased fall risk.    Status On-going      PT LONG TERM GOAL #4   Title Pt will be able to stand for home tasks 10-15 min  with pain in back and hip < 4/10 for periods of 30 min    Status On-going      PT LONG TERM GOAL #5   Title Pt will reach arms overhead and achieve neutral spine in standing (extension) with min pain < 2/10    Baseline pain in shoulders, poor spine extension    Status On-going                   Plan - 09/28/20 1137     Clinical Impression Statement Worked today on postural strength as well as quadruped for hip and back mobility.  She was fearful due to past trauma and will be OK with seated praying as long as she needs to, does not wish to work  on this further.  She lacks R quad strength with SLR.  Will cont POC until she sees MD 10/17/20.    PT Treatment/Interventions ADLs/Self Care Home Management;Cryotherapy;Moist Heat;Gait training;Stair training;Functional mobility training;Manual techniques;Balance training;Therapeutic exercise;Patient/family education    PT Next Visit Plan Sharlene Motts?  posture and gait, standing as tolerated with rest breaks offered.    PT Home Exercise Plan Access Code: [email or web address]    Consulted and Agree with Plan of Care Patient             Patient will benefit from skilled therapeutic intervention in order to improve the following deficits and impairments:  Abnormal gait, Increased fascial restricitons, Pain, Improper body mechanics, Postural dysfunction, Decreased mobility, Decreased range of motion, Decreased endurance, Decreased strength, Hypomobility, Impaired UE functional use, Impaired flexibility, Decreased balance, Difficulty walking, Increased edema  Visit Diagnosis: Chronic bilateral low back pain without sciatica  Muscle weakness (generalized)  Pain in right hip  Other abnormalities of gait and mobility     Problem List Patient Active Problem List   Diagnosis Date Noted   Status post total replacement of right hip 06/23/2020   Diabetic neuropathy (HCC) 04/13/2020   Acquired hypothyroidism 12/16/2019   Hyperlipidemia associated with type 2 diabetes mellitus (HCC) 12/16/2019   Hammer toes of both feet 12/16/2019   Degenerative arthritis of lumbar spine 12/16/2019   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   Essential hypertension 02/19/2019   Postsurgical states following surgery of eye and adnexa 10/01/2018   Pseudophakia of both eyes 04/23/2018   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 [place], [place], [address] Phone: [phone]   Fax:  [phone]  Name: Norma Clay MRN: [id number] Date of Birth: [birth year]   Karie Mainland, PT 09/28/20 1:11 PM Phone: [phone] Fax: [phone]

## 2020-10-01 ENCOUNTER — Encounter: Payer: Self-pay | Admitting: Physical Therapy

## 2020-10-01 ENCOUNTER — Ambulatory Visit: Payer: Medicare Other | Admitting: Physical Therapy

## 2020-10-01 ENCOUNTER — Other Ambulatory Visit: Payer: Self-pay

## 2020-10-01 DIAGNOSIS — R293 Abnormal posture: Secondary | ICD-10-CM

## 2020-10-01 DIAGNOSIS — M6281 Muscle weakness (generalized): Secondary | ICD-10-CM

## 2020-10-01 DIAGNOSIS — M25551 Pain in right hip: Secondary | ICD-10-CM

## 2020-10-01 DIAGNOSIS — R2689 Other abnormalities of gait and mobility: Secondary | ICD-10-CM

## 2020-10-01 DIAGNOSIS — G8929 Other chronic pain: Secondary | ICD-10-CM

## 2020-10-01 DIAGNOSIS — M545 Low back pain, unspecified: Secondary | ICD-10-CM | POA: Diagnosis not present

## 2020-10-01 NOTE — Therapy (Addendum)
[place], [place], [address] Phone: [phone]   Fax:  [phone]  Physical Therapy Treatment/ERO/DC  Patient Details  Name: Norma Clay MRN: [id number] Date of Birth: [birth year] Referring Provider (PT): Dr. Jean Rosenthal   Encounter Date: 10/01/2020   PT End of Session - 10/01/20 1213     Visit Number 5    Number of Visits 12    Date for PT Re-Evaluation 10/29/20    Authorization Type [employer] M[employer]    PT Start Time 1148    PT Stop Time 5537    PT Time Calculation (min) 47 min    Activity Tolerance Patient tolerated treatment well    Behavior During Therapy [facility] for tasks assessed/performed             Past Medical History:  Diagnosis Date   Diabetes mellitus without complication ([place])    Glaucoma    Hypertension    Osteoarthritis of right hip 05/16/2016   Posterior capsular opacification of both eyes, 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   Primary osteoarthritis of right hip 05/06/2020    Past Surgical History:  Procedure Laterality Date   ABDOMINAL HYSTERECTOMY     GLAUCOMA SURGERY     THYROIDECTOMY     TOTAL HIP ARTHROPLASTY Right 06/23/2020   Procedure: RIGHT TOTAL HIP ARTHROPLASTY ANTERIOR APPROACH;  Surgeon: Mcarthur Rossetti, MD;  Location: [place];  Service: Orthopedics;  Laterality: Right;    There were no vitals filed for this visit.   Subjective Assessment - 10/01/20 1154     Subjective Pt with new insurance, family is handling that going forward.  She has pain in Rt hip and back 5/10 No questions about her HEP.    Pertinent History THR anterior approach, Diabetes, OA, back pain with PT 1 yr ago    Limitations Lifting;Standing;Walking;House hold activities    How long can you stand comfortably? can do 30 min some days in the home    Currently in Pain? Yes    Pain Score 5     Pain Location Hip    Pain Orientation Right    Pain Descriptors / Indicators Aching;Sore    Pain  Onset More than a month ago    Pain Frequency Intermittent    Aggravating Factors  walking, standing, activity    Pain Relieving Factors rest    Multiple Pain Sites Yes    Pain Score 5    Pain Location Back    Pain Orientation Lower    Pain Descriptors / Indicators Aching    Pain Type Chronic pain    Pain Onset More than a month ago    Pain Frequency Constant    Aggravating Factors  standing up straight , bending forward to pray    Pain Relieving Factors rest                [facility] PT Assessment - 10/01/20 0001       Assessment   Medical Diagnosis Rt THR , back pain    Referring Provider (PT) Dr. Jean Rosenthal    Onset Date/Surgical Date 06/15/20    Prior Therapy Yes 1 yr      Precautions   Precautions Anterior Hip      Restrictions   Weight Bearing Restrictions No      Balance Screen   Has the patient fallen in the past 6 months Yes    How many times? 2    Has the patient had a decrease  in activity level because of a fear of falling?  Yes    Is the patient reluctant to leave their home because of a fear of falling?  Yes      [place] residence    Type of [place] to enter    [employer] - quad      Prior Function   Level of Independence Independent with household mobility with device;Independent with community mobility with device      Cognition   Overall Cognitive Status Within Functional Limits for tasks assessed      Observation/Other Assessments   Focus on Therapeutic Outcomes (FOTO)  NT language      Sensation   Light Touch Impaired by gross assessment    Additional Comments feet/toes Rt > L.      Posture/Postural Control   Posture/Postural Control Postural limitations      AROM   Overall AROM Comments trunk flexion WNL, extension limited to lacking neutral by about 20 deg.  Rotation 50% or more..  Knees limited in  flexion as well, not measured      PROM   Overall PROM  Comments Rt hip ER/IR about 20-25 deg , hip flex to 100 degm full PROM on L.t hip      Strength   Right Hip ABduction 3+/5    Right Knee Flexion 4+/5    Right Knee Extension 4+/5    Left Knee Flexion 5/5    Left Knee Extension 5/5             Hip abduction x 10 on Rt LE   Quad set max cues x 10  SLR Rt LE x 10   Long arcs quad 5 lbs x 20 with tactile cues to Rt quad   Small pulse  x 10 Sit to stand x 5 in 29 sec, light UE assist Standing hip activation  x 10 each side cues for Rt LE muscle activation  Standing hip extension x 10 each side   NuSteps 8 min UE and LE level 5       PT Education - 10/01/20 1215     Education Details body mechanics supine to sit to supine to preserve back    Person(s) Educated Patient    Methods Explanation    Comprehension Verbalized understanding;Returned demonstration                 PT Long Term Goals - 10/01/20 1213       PT LONG TERM GOAL #1   Title Pt will be I and compliant with long term HEP for progression and maintenance of condition.    Baseline compliant thus far    Status On-going      PT LONG TERM GOAL #2   Title Pt will be able to perform 5 x STS without hands in < 30 sec    Baseline 29 sec with UE assist, cues    Status Partially Met      PT LONG TERM GOAL #3   Title Pt will complete Berg Balance test, TBA depending on iniital score for decreased fall risk.    Baseline defer, balance deficits due to weakness, will cont to address    Status Deferred      PT LONG TERM GOAL #4   Title Pt will be able to stand for home tasks 10-15 min  with pain in back and hip < 4/10 for periods  of 30 min    Baseline can do this but pain is 5/10 or more    Status On-going      PT LONG TERM GOAL #5   Title Pt will reach arms overhead and achieve neutral spine in standing (extension) with min pain < 2/10    Baseline pain in shoulders, poor spine extension    Status On-going                   Plan - 10/01/20  1210     Clinical Impression Statement Patient continues to be limited by back and Rt hip pain with standing, walking.  Spent time instructing on bed mobility to preserve pain in back. She lacks quad strength on Rt LE, used mutliple cues for techniques to demonstrat this.  [employer] interpreters system was inconsistent, needed 4 separate interpreters for this session.    PT Treatment/Interventions ADLs/Self Care Home Management;Cryotherapy;Moist Heat;Gait training;Stair training;Functional mobility training;Manual techniques;Balance training;Therapeutic exercise;Patient/family education    PT Next Visit Plan cont POC, Rt LE strength, posture, stretch to Rt ant hip , try standing or supine    PT Home Exercise Plan Access Code: [email or web address]    Consulted and Agree with Plan of Care Patient             Patient will benefit from skilled therapeutic intervention in order to improve the following deficits and impairments:  Abnormal gait, Increased fascial restricitons, Pain, Improper body mechanics, Postural dysfunction, Decreased mobility, Decreased range of motion, Decreased endurance, Decreased strength, Hypomobility, Impaired UE functional use, Impaired flexibility, Decreased balance, Difficulty walking, Increased edema  Visit Diagnosis: Chronic bilateral low back pain without sciatica  Pain in right hip  Other abnormalities of gait and mobility  Abnormal posture  Muscle weakness (generalized)     Problem List Patient Active Problem List   Diagnosis Date Noted   Status post total replacement of right hip 06/23/2020   Diabetic neuropathy ([place]) 04/13/2020   Acquired hypothyroidism 12/16/2019   Hyperlipidemia associated with type 2 diabetes mellitus ([place]) 12/16/2019   Hammer toes of both feet 12/16/2019   Degenerative arthritis of lumbar spine 12/16/2019   Post-surgical hypoparathyroidism ([place]) 03/25/2019   Essential hypertension 02/19/2019   Postsurgical states following surgery of eye and  adnexa 10/01/2018   Pseudophakia of both eyes 04/23/2018   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 [place], [place], [address] Phone: [phone]   Fax:  [phone]  Name: Angeliyah Kirkey MRN: [id number] Date of Birth: 09-04-1950  Raeford Razor, PT 10/01/20 1:27 PM Phone: [phone] Fax: [phone]    PHYSICAL THERAPY DISCHARGE SUMMARY  Visits from Start of Care: 5  Current functional level related to goals / functional outcomes: Unknown   Remaining deficits: Unknown   Education / Equipment: HEP   Patient agrees to discharge. Patient goals were partially met. Patient is being discharged due to not returning since the last visit.  Raeford Razor, PT 01/20/21 2:49 PM Phone: [phone] Fax: [phone]

## 2020-10-03 ENCOUNTER — Ambulatory Visit: Payer: Medicaid Other | Admitting: Family Medicine

## 2020-10-14 ENCOUNTER — Ambulatory Visit (INDEPENDENT_AMBULATORY_CARE_PROVIDER_SITE_OTHER): Payer: Medicare Other | Admitting: Orthopaedic Surgery

## 2020-10-14 ENCOUNTER — Other Ambulatory Visit: Payer: Self-pay

## 2020-10-14 ENCOUNTER — Ambulatory Visit: Payer: Self-pay

## 2020-10-14 DIAGNOSIS — M5441 Lumbago with sciatica, right side: Secondary | ICD-10-CM | POA: Diagnosis not present

## 2020-10-14 DIAGNOSIS — M5442 Lumbago with sciatica, left side: Secondary | ICD-10-CM

## 2020-10-14 DIAGNOSIS — M4807 Spinal stenosis, lumbosacral region: Secondary | ICD-10-CM

## 2020-10-14 NOTE — Progress Notes (Signed)
    HPI: Patient returns today for follow-up of her right hip.  She states the hip overall is doing well.  She underwent a right total hip on 06/23/2020.  She continues to have low back pain.  She has been to physical therapy feels this helped about 50% but still having radicular symptoms down both legs into her lower legs.  She notes she does have some numbness in her feet.  She has diabetes and some diabetic neuropathy better.  Pain is worse with prolonged sitting and standing.  She denies any waking pain bowel or bladder dysfunction or saddle anesthesia like symptoms.  She does note that she has had some increased urination.  She states her back pain is 5 out of 10 at worst.   Review of systems: See HPI otherwise negative.  Physical exam: General well-developed well-nourished female no acute distress. Bilateral hips good range of motion bilateral hips without pain. Lower extremities 5 out of 5 strength throughout against resistance.  Negative straight leg raise bilaterally.  Radiographs radiographs lumbar spine 2 views: No acute fractures.  Loss of lordosis.  No spinal listhesis.  Mild degenerative changes.  This space overall well-maintained.  Impression: Status post right total hip arthroplasty 06/23/2020 Low back pain with radicular symptoms bilateral lower extremities  Plan: Explained to the patient through the interpreter was present throughout the exam today that would recommend MRI of her lumbar spine to rule rule out a disc herniation as the source of her lower back pain and radicular symptoms down the legs.  In regards to her increased urination suggest that she see her primary care physician.  Questions were encouraged and answered.  We will see her back after the MRI to go over results and discuss further treatment.  In regards to her right hip we can see her back in June 2023 and obtain an AP pelvis and lateral view of the right hip at that time.  Questions were encouraged and  answered

## 2020-11-04 ENCOUNTER — Telehealth: Payer: Self-pay | Admitting: Orthopaedic Surgery

## 2020-11-05 ENCOUNTER — Other Ambulatory Visit: Payer: Medicare Other

## 2020-11-05 ENCOUNTER — Other Ambulatory Visit: Payer: Self-pay

## 2020-11-05 ENCOUNTER — Ambulatory Visit
Admission: RE | Admit: 2020-11-05 | Discharge: 2020-11-05 | Disposition: A | Discharge status: Home / Self Care | Payer: Medicare Other | Source: Ambulatory Visit | Attending: Orthopaedic Surgery | Admitting: Orthopaedic Surgery

## 2020-11-05 DIAGNOSIS — M4807 Spinal stenosis, lumbosacral region: Secondary | ICD-10-CM

## 2020-11-07 ENCOUNTER — Ambulatory Visit (INDEPENDENT_AMBULATORY_CARE_PROVIDER_SITE_OTHER): Payer: Medicaid Other | Admitting: Family Medicine

## 2020-11-07 ENCOUNTER — Encounter: Payer: Self-pay | Admitting: Family Medicine

## 2020-11-07 DIAGNOSIS — R4586 Emotional lability: Secondary | ICD-10-CM

## 2020-11-07 NOTE — Progress Notes (Signed)
  Patient Name: Norma Clay Date of Birth: Jan 20, 1950 Date of Visit: 11/07/20 PCP: Storm Frisk, MD  Chief Complaint: form completion   Subjective: Norma Clay is a pleasant 69 y.o. with medical history significant for type 2 diabetes, thyroid disease presenting today for completion of N-648 form.   The patient speaks Arabic as their primary language.  An interpreter was used for the entire visit.   The purpose of this visit was explained with to the patient and family members. The patient was interviewed alone.   Identification confirmed and documented on N-648.   She is alone today, with glasses in place. She reports she lives with her son. She had 9 children. One died at age 68 and this has caused sadness. She additionally endorses chronic migraines.   [employer] Screener-15 Score: 2 RUDAS Score: 22  (22 or less indicates cognitive impairment)   Independent with ADL Function:  Ambulating: Yes Feeding:Yes Bathing:Yes Dressing:Yes Toileting: Yes Transferring: Yes  Independent with Instrumental ADL Function   Finances:No Transportation: No Meal preparation: No Household chores: No Communication with others: No Medications: No   PMH:   Past Medical History:  Diagnosis Date   Diabetes mellitus without complication (HCC)    Glaucoma    Hypertension    Osteoarthritis of right hip 05/16/2016   Posterior capsular opacification of both eyes, 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   Primary osteoarthritis of right hip 05/06/2020    PSH: Past Surgical History:  Procedure Laterality Date   ABDOMINAL HYSTERECTOMY     GLAUCOMA SURGERY     THYROIDECTOMY     TOTAL HIP ARTHROPLASTY Right 06/23/2020   Procedure: RIGHT TOTAL HIP ARTHROPLASTY ANTERIOR APPROACH;  Surgeon: Kathryne Hitch, MD;  [facility];  Service: Orthopedics;  Laterality: Right;     Social History: Has 9 children Lives with son  Requires help with basic items like making tea   ROS: Per HPI.    I have reviewed the patient's medical, surgical, family, and social history as appropriate.   HEENT: Sclera anicteric. Dentition is poor. + glasses . Appears well hydrated. Cardiac: Warm well perfused.  Capillary refill less than 3 seconds Respiratory breathing comfortably on room air Psych: Pleasant normal affect, appropriate, normal rate of speech  Psych: Pleasant and appropriate   Mood Disorder and Memory Impairment, suspected mood disorder based upon RHS and history. Has evidence of cognitive impairment as noted by RUDAS. No imaging available for review, possible dementia (? Vascular cause). Will complete N 648.   Evaluated with Clinical research associate (interpreter)  [employer] Student Jessica and Simmie Davies, MD  [facility] Medicine Teaching Service

## 2020-11-27 ENCOUNTER — Other Ambulatory Visit: Payer: Self-pay | Admitting: Critical Care Medicine

## 2020-11-27 DIAGNOSIS — E785 Hyperlipidemia, unspecified: Secondary | ICD-10-CM

## 2020-11-27 DIAGNOSIS — E1169 Type 2 diabetes mellitus with other specified complication: Secondary | ICD-10-CM

## 2020-11-29 NOTE — Telephone Encounter (Signed)
Last RF 09/14/20 ended 12/13/20 Overdue labs Pt has upcoming appt Requested Prescriptions  Pending Prescriptions Disp Refills   atorvastatin (LIPITOR) 20 MG tablet [Pharmacy Med Name: ATORVASTATIN 20 MG TABLET] 90 tablet     Sig: TAKE 1 TABLET BY MOUTH EVERY DAY     Cardiovascular:  Antilipid - Statins Failed - 11/27/2020  3:49 PM      Failed - Total Cholesterol in normal range and within 360 days    Cholesterol, Total  Date Value Ref Range Status  04/17/2019 138 100 - 199 mg/dL Final          Failed - LDL in normal range and within 360 days    LDL Chol Calc (NIH)  Date Value Ref Range Status  04/17/2019 59 0 - 99 mg/dL Final          Failed - HDL in normal range and within 360 days    HDL  Date Value Ref Range Status  04/17/2019 68 >39 mg/dL Final          Failed - Triglycerides in normal range and within 360 days    Triglycerides  Date Value Ref Range Status  04/17/2019 50 0 - 149 mg/dL Final          Passed - Patient is not pregnant      Passed - Valid encounter within last 12 months    Recent Outpatient Visits           2 months ago Type 2 diabetes mellitus without complication, without long-term current use of insulin (HCC)   [facility] Storm Frisk, MD   4 months ago Essential hypertension   [facility] Storm Frisk, MD   6 months ago Type 2 diabetes mellitus without complication, without long-term current use of insulin [facility])   [facility] [place], Jeannett Senior L, RPH-CPP   7 months ago Type 2 diabetes mellitus without complication, without long-term current use of insulin [facility])   [facility] Storm Frisk, MD   8 months ago Type 2 diabetes mellitus without complication, without long-term current use of insulin [facility])    [facility] Lois Huxley, Cornelius Moras, RPH-CPP       Future Appointments              In 2 weeks Delford Field Charlcie Cradle, MD [facility]   In 2 weeks Magnus Ivan, Vanita Panda, MD [facility]

## 2020-12-04 ENCOUNTER — Encounter: Payer: Self-pay | Admitting: Internal Medicine

## 2020-12-04 ENCOUNTER — Other Ambulatory Visit: Payer: Self-pay | Admitting: Internal Medicine

## 2020-12-04 ENCOUNTER — Ambulatory Visit (INDEPENDENT_AMBULATORY_CARE_PROVIDER_SITE_OTHER): Payer: Medicare Other | Admitting: Internal Medicine

## 2020-12-04 ENCOUNTER — Other Ambulatory Visit: Payer: Self-pay

## 2020-12-04 VITALS — BP 146/94 | HR 65 | Ht 66.0 in | Wt 146.0 lb

## 2020-12-04 DIAGNOSIS — E041 Nontoxic single thyroid nodule: Secondary | ICD-10-CM | POA: Diagnosis not present

## 2020-12-04 DIAGNOSIS — R350 Frequency of micturition: Secondary | ICD-10-CM | POA: Diagnosis not present

## 2020-12-04 DIAGNOSIS — E892 Postprocedural hypoparathyroidism: Secondary | ICD-10-CM | POA: Diagnosis not present

## 2020-12-04 DIAGNOSIS — E890 Postprocedural hypothyroidism: Secondary | ICD-10-CM

## 2020-12-04 NOTE — Progress Notes (Signed)
Name: Norma Clay  MRN/ DOB: [id number], June 13, 1950    Age/ Sex: 70 y.o., female     PCP: Elsie Stain, MD   Reason for Endocrinology Evaluation: Post-surgical hypoparathyroidism     Initial Endocrinology Clinic Visit: 12/08/17    PATIENT IDENTIFIER: Norma Clay is a 70 y.o., female with a past medical history of Total thyroidectomy and T2DM. She has followed with [facility] since 12/08/2017 for consultative assistance with management of her Post-surgical hypoparathyroidism.   HISTORICAL SUMMARY:  She  moved from [place] in 2019. 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 She denies history of thyroid cancer. Pt noted hand spasms following 2nd surgery and has been on Calcium and Calcitriol since. Historically has compliance issues which results in serum calcium fluctuations.  24-hr urine collection normal 06/2018  SUBJECTIVE:   Today (12/04/2020):  Norma Clay is here for a follow up on hypocalcemia and hypothyroidism   Has occasional dysphagia and neck pain    Denies  numbness or perioral tingling. Has occasional constipation  S/P right hip replacement 06/2020  She has noted frequency for a while associated with flanks pain and suprapubic pain    Medication: Calcium carbonate (oyster shell) 500 mg, 1 tabs TID Calcitriol 0.25 MCG, 2 caps daily Vitamin D3 400 IU daily Levothyroxine 88 mcg daily      HISTORY:  Past Medical History:  Past Medical History:  Diagnosis Date   Diabetes mellitus without complication ([place])    Glaucoma    Hypertension    Osteoarthritis of right hip 05/16/2016   Posterior capsular opacification of both eyes, 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   Primary osteoarthritis of right hip 05/06/2020   Past Surgical History:  Past Surgical History:  Procedure Laterality Date   ABDOMINAL HYSTERECTOMY     GLAUCOMA SURGERY     THYROIDECTOMY     TOTAL HIP  ARTHROPLASTY Right 06/23/2020   Procedure: RIGHT TOTAL HIP ARTHROPLASTY ANTERIOR APPROACH;  Surgeon: Mcarthur Rossetti, MD;  Location: [facility];  Service: Orthopedics;  Laterality: Right;   Social History:  reports that she has never smoked. She has never used smokeless tobacco. She reports that she does not drink alcohol and does not use drugs. Family History: family history includes Diabetes in her mother.   HOME MEDICATIONS: Allergies as of 12/04/2020       Reactions   Penicillins Rash   Patient was placed on a form of penicillin by her dentist and developed a rash therefore medication was changed to clindamycin.  Per son, patient is not allergic to clindamycin        Medication List        Accurate as of December 04, 2020  4:19 PM. If you have any questions, ask your nurse or doctor.          Accu-Chek Guide Me w/Device Kit Use to check TID.   Accu-Chek Softclix Lancets lancets USE AS INSTRUCTED USE TO CHECK 3 TIMES DAILY   acetaminophen 325 MG tablet Commonly known as: Tylenol Take 1-2 tablets (325-650 mg total) by mouth every 6 (six) hours as needed for moderate pain.   atorvastatin 20 MG tablet Commonly known as: LIPITOR TAKE 1 TABLET BY MOUTH EVERY DAY   brimonidine 0.2 % ophthalmic solution Commonly known as: ALPHAGAN Apply to eye.   calcitRIOL 0.25 MCG capsule Commonly known as: ROCALTROL Take 2 capsules (0.5 mcg total) by mouth daily.  calcium carbonate 1250 (500 Ca) MG chewable tablet Commonly known as: OS-CAL Chew 1 tablet by mouth 3 (three) times daily.   clotrimazole 1 % cream Commonly known as: LOTRIMIN Apply 1 application topically 2 (two) times daily. To both feet at base of toes   dorzolamide-timolol 22.3-6.8 MG/ML ophthalmic solution Commonly known as: COSOPT Place 1 drop into both eyes 2 (two) times daily   empagliflozin 10 MG Tabs tablet Commonly known as: JARDIANCE TAKE 1 TABLET (10 MG TOTAL) BY MOUTH DAILY BEFORE BREAKFAST.    glucose blood test strip USE TO CHECK 3 TIMES DAILY   levothyroxine 88 MCG tablet Commonly known as: SYNTHROID Take 1 tablet (88 mcg total) by mouth daily.   metFORMIN 850 MG tablet Commonly known as: GLUCOPHAGE Take 1 tablet (850 mg total) by mouth 2 (two) times daily with a meal.   omeprazole 40 MG capsule Commonly known as: PRILOSEC Take 1 capsule (40 mg total) by mouth daily.   Travatan Z 0.004 % Soln ophthalmic solution Generic drug: Travoprost (BAK Free) Place 1 drop into both eyes nightly   Vitamin D3 10 MCG (400 UNIT) tablet Take 1 tablet (400 Units total) by mouth daily.          OBJECTIVE:   PHYSICAL EXAM: VS:BP (!) 146/94 (BP Location: Left Arm, Patient Position: Sitting, Cuff Size: Small)   Pulse 65   Ht _0  (1.676 m)   Wt 146 lb (66.2 kg)   SpO2 99%   BMI 23.57 kg/m   EXAM: General: Pt appears well and is in NAD Soft localized swelling at the anterior neck with tenderness -chronic  Lungs: Clear with good BS bilat with no rales, rhonchi, or wheezes  Heart: Auscultation: RRR.  Extremities: No pretibial edema   Mental Status: Judgment, insight: Intact Mood and affect: No depression, anxiety, or agitation     DATA REVIEWED:   Latest Reference Range & Units 12/04/20 03:33  Sodium 134 - 144 mmol/L 139  Potassium 3.5 - 5.2 mmol/L 4.6  Chloride 96 - 106 mmol/L 102  CO2 20 - 29 mmol/L 22  Glucose 70 - 99 mg/dL 223 (H)  BUN 8 - 27 mg/dL 33 (H)  Creatinine 0.57 - 1.00 mg/dL 1.06 (H)  Calcium 8.7 - 10.3 mg/dL 8.9  BUN/Creatinine Ratio 12 - 28  31 (H)  eGFR >59 mL/min/1.73 56 (L)  Albumin 3.7 - 4.7 g/dL 4.3  Vitamin D, 25-Hydroxy 30.0 - 100.0 ng/mL 34.1  TSH 0.450 - 4.500 uIU/mL 1.210    Thyroid ULtrasound 12/2019 The thyroid gland is surgically absent. Evaluation of the left resection bed demonstrates no evidence of residual or recurrent thyroid tissue.   However, evaluation of the right thyroid resection bed demonstrates a recurrent  rounded nodular focus of soft tissue measuring 2.2 x 1.3 x 1.1 cm. The structure is solid and mildly heterogeneous in appearance. No internal calcifications or punctate echogenic foci. No suspicious lymphadenopathy.   IMPRESSION: 1. Recurrent nodule in the right thyroid resection bed. If the patient's prior thyroidectomy was performed for malignancy, then fine-needle aspiration biopsy is recommended. 2. Surgical changes of thyroidectomy without evidence of recurrent thyroid tissue or nodule in the left resection bed.    ASSESSMENT / PLAN / RECOMMENDATIONS:   Surgical Hypoparathyroidism :    - Pt is asymptomatic  - Calcium level is normal  -24-hour urine collection was acceptable at 213 on 06/03/2020, this was 166 in 2020   Medications:  Continue Calcium Carbonate 500 mg 1 tabs TID Continue  Calcitriol 0.25 mcg ( 2 Caps ) daily   Continue Vitamin D3 400 iu daily     2. Postsurgical hypothyroidism :  - Clinically euthyroid  - Pt with local neck symptoms, will proceed with thyroid bed ultrasound  - TSH is normal   Continue levothyroxine 88 mcg daily     3. Right thyroid nodule :  -Patient with chronic neck tenderness ultrasound last year showed right thyroid bed nodule  - Will proceed with thyroid ultrasound this year    4.  Urinary frequency:   -Differential diagnosis includes UTI, hyperglycemia, neurogenic bladder -UA is pending but unfortunately this was delivered to the wrong lab, pt may return here for a repeat , or she through PCP 's office as she has an upcoming appointment   F/u in 6 months        Signed electronically by: Mack Guise, MD  [facility]  [facility] [address] [place], [place] [address] Phone: [phone] FAX: [phone]      CC: Elsie Stain, MD [address] [place] [address] Phone: [phone]  Fax: [phone]   Return to Endocrinology clinic as  below: Future Appointments  Date Time Provider [place]  12/14/2020  9:00 AM Elsie Stain, MD [facility] None  12/16/2020  1:30 PM Mcarthur Rossetti, MD [facility] None  12/23/2020 12:10 PM [facility] MM 2 [facility]

## 2020-12-04 NOTE — Patient Instructions (Signed)
-   Continue Calcium Carbonate  1 tablet three times daily for now  - Continue Calcitriol two capsules a day  - Continue Over the counter Vitamin D3 400 iu daily  - Continue Levothyroxine 88 mcg daily

## 2020-12-07 ENCOUNTER — Encounter: Payer: Self-pay | Admitting: Internal Medicine

## 2020-12-07 ENCOUNTER — Other Ambulatory Visit: Payer: Self-pay

## 2020-12-07 LAB — SPECIMEN STATUS REPORT

## 2020-12-08 LAB — BASIC METABOLIC PANEL
BUN/Creatinine Ratio: 31 — ABNORMAL HIGH (ref 12–28)
BUN: 33 mg/dL — ABNORMAL HIGH (ref 8–27)
CO2: 22 mmol/L (ref 20–29)
Calcium: 8.9 mg/dL (ref 8.7–10.3)
Chloride: 102 mmol/L (ref 96–106)
Creatinine, Ser: 1.06 mg/dL — ABNORMAL HIGH (ref 0.57–1.00)
Glucose: 223 mg/dL — ABNORMAL HIGH (ref 70–99)
Potassium: 4.6 mmol/L (ref 3.5–5.2)
Sodium: 139 mmol/L (ref 134–144)
eGFR: 56 mL/min/{1.73_m2} — ABNORMAL LOW (ref >59)

## 2020-12-08 LAB — TSH: TSH: 1.21 u[IU]/mL (ref 0.450–4.500)

## 2020-12-08 LAB — VITAMIN D 25 HYDROXY (VIT D DEFICIENCY, FRACTURES): Vit D, 25-Hydroxy: 34.1 ng/mL (ref 30.0–100.0)

## 2020-12-08 LAB — SPECIMEN STATUS REPORT

## 2020-12-08 LAB — ALBUMIN: Albumin: 4.3 g/dL (ref 3.7–4.7)

## 2020-12-08 MED ORDER — LEVOTHYROXINE SODIUM 88 MCG PO TABS: 88.0000 ug | ORAL_TABLET | Freq: Every day | ORAL | 3 refills | Status: DC

## 2020-12-08 MED ORDER — VITAMIN D3 10 MCG (400 UNIT) PO TABS: 400.0000 [IU] | ORAL_TABLET | Freq: Every day | ORAL | 3 refills | Status: AC

## 2020-12-08 MED ORDER — CALCIUM CARBONATE 1250 (500 CA) MG PO CHEW: 1.0000 | CHEWABLE_TABLET | Freq: Three times a day (TID) | ORAL | 3 refills | Status: DC

## 2020-12-08 MED ORDER — CALCITRIOL 0.25 MCG PO CAPS: 0.5000 ug | ORAL_CAPSULE | Freq: Every day | ORAL | 3 refills | Status: AC

## 2020-12-12 ENCOUNTER — Encounter: Payer: Self-pay | Admitting: Critical Care Medicine

## 2020-12-13 NOTE — Progress Notes (Signed)
Established Patient Office Visit  Subjective:  Patient ID: Norma Clay, female    DOB: [birth year]  Age: 70 y.o. MRN: [id number]  CC:  Chief Complaint  Patient presents with   Diabetes    HPI Norma Clay presents for primary care follow-up.  This visit was assisted by Arabic interpreter Samantha Crimes [phone] Patient returns in follow-up 70 year old female with diabetes hypertension prior thyroidectomy.  Patient is having increased difficulty with swallowing and throat irritations.  Patient seen by endocrinology and because of this she has an ultrasound of the thyroid pending.  It is uncertain if the entire thyroid was been removed.  She has been tested for hypoparathyroidism hypothyroidism as well and calcium levels are normal and there is no evidence of hypothyroidism on testing.  All medications were renewed by endocrinology.  We managing her diabetes she is on metformin alone.  She will need follow-up lab testing as well.  Blood pressure mildly elevated at this visit.  She does have an upcoming mammogram in December later this month. Patient complains of dysuria and lower and upper abdominal pain and periumbilical pain as well.  She has no nausea or vomiting.  There are no other complaints     Past Medical History:  Diagnosis Date   Diabetes mellitus without complication ([place])    Glaucoma    Hypertension    Osteoarthritis of right hip 05/16/2016   Posterior capsular opacification of both eyes, 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   Primary osteoarthritis of right hip 05/06/2020    Past Surgical History:  Procedure Laterality Date   ABDOMINAL HYSTERECTOMY     GLAUCOMA SURGERY     THYROIDECTOMY     TOTAL HIP ARTHROPLASTY Right 06/23/2020   Procedure: RIGHT TOTAL HIP ARTHROPLASTY ANTERIOR APPROACH;  Surgeon: Mcarthur Rossetti, MD;  Location: [facility];  Service: Orthopedics;  Laterality: Right;    Family History  Problem Relation Age of Onset   Diabetes Mother    Colon cancer Neg Hx     Stomach cancer Neg Hx    Esophageal cancer Neg Hx     Social History   Socioeconomic History   Marital status: Widowed    Spouse name: Not on file   Number of children: Not on file   Years of education: Not on file   Highest education level: Not on file  Occupational History   Not on file  Tobacco Use   Smoking status: Never   Smokeless tobacco: Never  Vaping Use   Vaping Use: Never used  Substance and Sexual Activity   Alcohol use: No   Drug use: No   Sexual activity: Not Currently  Other Topics Concern   Not on file  Social History Narrative   Not on file   Social Determinants of Health   Financial Resource Strain: Not on file  Food Insecurity: Not on file  Transportation Needs: Not on file  Physical Activity: Not on file  Stress: Not on file  Social Connections: Not on file  Intimate Partner Violence: Not on file    Outpatient Medications Prior to Visit  Medication Sig Dispense Refill   Accu-Chek Softclix Lancets lancets USE AS INSTRUCTED USE TO CHECK 3 TIMES DAILY 100 each 2   acetaminophen (TYLENOL) 325 MG tablet Take 1-2 tablets (325-650 mg total) by mouth every 6 (six) hours as needed for moderate pain. 30 tablet 0   Blood Glucose Monitoring Suppl (ACCU-CHEK GUIDE ME) w/Device KIT Use to check TID. 1 kit 0   brimonidine (ALPHAGAN) 0.2 %  ophthalmic solution Apply to eye.     calcitRIOL (ROCALTROL) 0.25 MCG capsule Take 2 capsules (0.5 mcg total) by mouth daily. 180 capsule 3   calcium carbonate (OS-CAL) 1250 (500 Ca) MG chewable tablet Chew 1 tablet (1,250 mg total) by mouth 3 (three) times daily. 270 tablet 3   Cholecalciferol (VITAMIN D3) 10 MCG (400 UNIT) tablet Take 1 tablet (400 Units total) by mouth daily. 90 tablet 3   clotrimazole (LOTRIMIN) 1 % cream Apply 1 application topically 2 (two) times daily. To both feet at base of toes 30 g 0   dorzolamide-timolol (COSOPT) 22.3-6.8 MG/ML ophthalmic solution Place 1 drop into both eyes 2 (two) times daily 40  mL 3   glucose blood test strip USE TO CHECK 3 TIMES DAILY 100 strip 2   levothyroxine (SYNTHROID) 88 MCG tablet Take 1 tablet (88 mcg total) by mouth daily. 90 tablet 3   Travoprost, BAK Free, (TRAVATAN) 0.004 % SOLN ophthalmic solution Place 1 drop into both eyes nightly 2.5 mL 6   atorvastatin (LIPITOR) 20 MG tablet TAKE 1 TABLET BY MOUTH EVERY DAY 30 tablet 0   empagliflozin (JARDIANCE) 10 MG TABS tablet TAKE 1 TABLET (10 MG TOTAL) BY MOUTH DAILY BEFORE BREAKFAST. 60 tablet 6   metFORMIN (GLUCOPHAGE) 850 MG tablet Take 1 tablet (850 mg total) by mouth 2 (two) times daily with a meal. 60 tablet 11   omeprazole (PRILOSEC) 40 MG capsule Take 1 capsule (40 mg total) by mouth daily. 30 capsule 3   No facility-administered medications prior to visit.    Allergies  Allergen Reactions   Penicillins Rash    Patient was placed on a form of penicillin by her dentist and developed a rash therefore medication was changed to clindamycin.  Per son, patient is not allergic to clindamycin    ROS Review of Systems  Constitutional:  Negative for chills, diaphoresis and fever.  HENT:  Positive for sore throat and trouble swallowing. Negative for congestion, hearing loss, nosebleeds and tinnitus.   Eyes:  Negative for photophobia and redness.  Respiratory:  Negative for cough, shortness of breath, wheezing and stridor.   Cardiovascular:  Negative for chest pain, palpitations and leg swelling.  Gastrointestinal:  Negative for abdominal pain, blood in stool, constipation, diarrhea, nausea and vomiting.  Endocrine: Negative for polydipsia.  Genitourinary:  Positive for pelvic pain and urgency. Negative for dysuria, flank pain and frequency.  Musculoskeletal:  Negative for back pain, myalgias and neck pain.  Skin:  Negative for rash.  Allergic/Immunologic: Negative for environmental allergies.  Neurological:  Negative for dizziness, tremors, seizures, weakness and headaches.  Hematological:  Does not  bruise/bleed easily.  Psychiatric/Behavioral:  Negative for suicidal ideas. The patient is not nervous/anxious.      Objective:    Physical Exam Vitals reviewed.  Constitutional:      Appearance: Normal appearance. She is well-developed. She is not diaphoretic.  HENT:     Head: Normocephalic and atraumatic.     Nose: No nasal deformity, septal deviation, mucosal edema or rhinorrhea.     Right Sinus: No maxillary sinus tenderness or frontal sinus tenderness.     Left Sinus: No maxillary sinus tenderness or frontal sinus tenderness.     Mouth/Throat:     Mouth: Mucous membranes are moist.     Pharynx: Oropharynx is clear. No oropharyngeal exudate.  Eyes:     General: No scleral icterus.    Conjunctiva/sclera: Conjunctivae normal.     Pupils: Pupils are equal, round,  and reactive to light.  Neck:     Thyroid: No thyromegaly.     Vascular: No carotid bruit or JVD.     Trachea: Trachea normal. No tracheal tenderness or tracheal deviation.     Comments: Scar present from prior thyroidectomy Cardiovascular:     Rate and Rhythm: Normal rate and regular rhythm.     Chest Wall: PMI is not displaced.     Pulses: Normal pulses. No decreased pulses.     Heart sounds: Normal heart sounds, S1 normal and S2 normal. Heart sounds not distant. No murmur heard. No systolic murmur is present.  No diastolic murmur is present.    No friction rub. No gallop. No S3 or S4 sounds.  Pulmonary:     Effort: No tachypnea, accessory muscle usage or respiratory distress.     Breath sounds: No stridor. No decreased breath sounds, wheezing, rhonchi or rales.  Chest:     Chest wall: No tenderness.  Abdominal:     General: Bowel sounds are normal. There is no distension.     Palpations: Abdomen is soft. Abdomen is not rigid.     Tenderness: There is no abdominal tenderness. There is no guarding or rebound.  Musculoskeletal:        General: Normal range of motion.     Cervical back: Normal range of motion  and neck supple. No edema, erythema, rigidity or tenderness. No muscular tenderness. Normal range of motion.  Lymphadenopathy:     Head:     Right side of head: No submental or submandibular adenopathy.     Left side of head: No submental or submandibular adenopathy.     Cervical: No cervical adenopathy.  Skin:    General: Skin is warm and dry.     Coloration: Skin is not pale.     Findings: No rash.     Nails: There is no clubbing.  Neurological:     Mental Status: She is alert and oriented to person, place, and time.     Sensory: No sensory deficit.  Psychiatric:        Mood and Affect: Mood normal.        Speech: Speech normal.        Behavior: Behavior normal.        Thought Content: Thought content normal.    BP (!) 146/75   Pulse 74   Resp 16   Wt 159 lb 3.2 oz (72.2 kg)   SpO2 100%   BMI 25.70 kg/m  Wt Readings from Last 3 Encounters:  12/14/20 159 lb 3.2 oz (72.2 kg)  12/04/20 146 lb (66.2 kg)  09/14/20 147 lb (66.7 kg)     Health Maintenance Due  Topic Date Due   COLON CANCER SCREENING ANNUAL FOBT  Never done   MAMMOGRAM  Never done   COVID-19 Vaccine (3 - Pfizer risk series) 04/16/2019    There are no preventive care reminders to display for this patient.  Lab Results  Component Value Date   TSH 1.210 12/04/2020   Lab Results  Component Value Date   WBC 9.0 06/24/2020   HGB 10.4 (L) 06/24/2020   HCT 31.7 (L) 06/24/2020   MCV 92.4 06/24/2020   PLT 183 06/24/2020   Lab Results  Component Value Date   NA 139 12/04/2020   K 4.6 12/04/2020   CO2 22 12/04/2020   GLUCOSE 223 (H) 12/04/2020   BUN 33 (H) 12/04/2020   CREATININE 1.06 (H) 12/04/2020   BILITOT 0.6  04/17/2019   ALKPHOS 94 04/17/2019   AST 40 04/17/2019   ALT 38 (H) 04/17/2019   PROT 6.8 04/17/2019   ALBUMIN 4.3 12/04/2020   CALCIUM 8.9 12/04/2020   ANIONGAP 11 06/24/2020   EGFR 56 (L) 12/04/2020   GFR 60.54 11/22/2019   Lab Results  Component Value Date   CHOL 138 04/17/2019    Lab Results  Component Value Date   HDL 68 04/17/2019   Lab Results  Component Value Date   LDLCALC 59 04/17/2019   Lab Results  Component Value Date   TRIG 50 04/17/2019   Lab Results  Component Value Date   CHOLHDL 2.0 04/17/2019   Lab Results  Component Value Date   HGBA1C 7.3 (A) 12/14/2020      Assessment & Plan:   Problem List Items Addressed This Visit       Cardiovascular and Mediastinum   Essential hypertension    Blood pressure still elevated at this time  Diastolic pressure is low systolic pressure only mildly elevated  Plan to monitor for now without medications        Relevant Medications   atorvastatin (LIPITOR) 20 MG tablet   PAD (peripheral artery disease) (HCC)   Relevant Medications   atorvastatin (LIPITOR) 20 MG tablet     Endocrine   Type 2 diabetes mellitus without complication, without long-term current use of insulin (HCC) - Primary    Type 2 diabetes we will continue metformin and Jardiance      Relevant Medications   atorvastatin (LIPITOR) 20 MG tablet   empagliflozin (JARDIANCE) 10 MG TABS tablet   metFORMIN (GLUCOPHAGE) 850 MG tablet   Other Relevant Orders   POCT glucose (manual entry) (Completed)   POCT glycosylated hemoglobin (Hb A1C) (Completed)   Comprehensive metabolic panel   Lipid panel   Acquired hypothyroidism    Thyroid levels normal has ultrasound pending      Hyperlipidemia associated with type 2 diabetes mellitus (HCC)    Continue atorvastatin      Relevant Medications   atorvastatin (LIPITOR) 20 MG tablet   empagliflozin (JARDIANCE) 10 MG TABS tablet   metFORMIN (GLUCOPHAGE) 850 MG tablet     Other   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, severe stage    Continue management per eye physician      Dysuria    Check urinalysis urine culture      Relevant Orders   Urine Culture   Urinalysis   Uveitis of right eye    As per eye specialist      Pelvic pain    Relevant Orders   Urine Culture   Urinalysis   Lower abdominal pain    Patient with periumbilical and lower abdominal pain  Check metabolic panel and blood counts  Obtain ultrasound of abdomen      Relevant Orders   CBC with Differential/Platelet   US Abdomen Complete   Other Visit Diagnoses     Colon cancer screening       Relevant Orders   Fecal occult blood, imunochemical       Meds ordered this encounter  Medications   atorvastatin (LIPITOR) 20 MG tablet    Sig: Take 1 tablet (20 mg total) by mouth daily.    Dispense:  90 tablet    Refill:  2    Must keep upcoming office visit for refills   empagliflozin (JARDIANCE) 10 MG TABS tablet    Sig: TAKE 1 TABLET (10 MG TOTAL)  BY MOUTH DAILY BEFORE BREAKFAST.    Dispense:  60 tablet    Refill:  6    Patient needed a 60 day refill, please disregard previous script for 30 day supply.   metFORMIN (GLUCOPHAGE) 850 MG tablet    Sig: Take 1 tablet (850 mg total) by mouth 2 (two) times daily with a meal.    Dispense:  60 tablet    Refill:  11   omeprazole (PRILOSEC) 40 MG capsule    Sig: Take 1 capsule (40 mg total) by mouth daily.    Dispense:  30 capsule    Refill:  3  38 minutes spent going over patient's history and physical using an interpreter complex decision making high  Follow-up: Return in about 2 months (around 02/14/2021).  Needs colon cancer screening issued fecal occult kit  Asencion Noble, MD

## 2020-12-14 ENCOUNTER — Encounter: Payer: Self-pay | Admitting: Critical Care Medicine

## 2020-12-14 ENCOUNTER — Other Ambulatory Visit: Payer: Self-pay

## 2020-12-14 ENCOUNTER — Encounter: Payer: Self-pay | Admitting: Orthopaedic Surgery

## 2020-12-14 ENCOUNTER — Ambulatory Visit: Payer: Medicare Other | Attending: Critical Care Medicine | Admitting: Critical Care Medicine

## 2020-12-14 VITALS — BP 146/75 | HR 74 | Resp 16 | Wt 159.2 lb

## 2020-12-14 DIAGNOSIS — I739 Peripheral vascular disease, unspecified: Secondary | ICD-10-CM | POA: Diagnosis not present

## 2020-12-14 DIAGNOSIS — R102 Pelvic and perineal pain unspecified side: Secondary | ICD-10-CM

## 2020-12-14 DIAGNOSIS — E119 Type 2 diabetes mellitus without complications: Secondary | ICD-10-CM

## 2020-12-14 DIAGNOSIS — E785 Hyperlipidemia, unspecified: Secondary | ICD-10-CM | POA: Diagnosis not present

## 2020-12-14 DIAGNOSIS — E039 Hypothyroidism, unspecified: Secondary | ICD-10-CM

## 2020-12-14 DIAGNOSIS — E1169 Type 2 diabetes mellitus with other specified complication: Secondary | ICD-10-CM

## 2020-12-14 DIAGNOSIS — R1030 Lower abdominal pain, unspecified: Secondary | ICD-10-CM | POA: Insufficient documentation

## 2020-12-14 DIAGNOSIS — H401413 Capsular glaucoma with pseudoexfoliation of lens, right eye, severe stage: Secondary | ICD-10-CM

## 2020-12-14 DIAGNOSIS — R300 Dysuria: Secondary | ICD-10-CM

## 2020-12-14 DIAGNOSIS — H209 Unspecified iridocyclitis: Secondary | ICD-10-CM

## 2020-12-14 DIAGNOSIS — Z1211 Encounter for screening for malignant neoplasm of colon: Secondary | ICD-10-CM

## 2020-12-14 DIAGNOSIS — I10 Essential (primary) hypertension: Secondary | ICD-10-CM

## 2020-12-14 LAB — GLUCOSE, POCT (MANUAL RESULT ENTRY): POC Glucose: 163 mg/dl — AB (ref 70–99)

## 2020-12-14 LAB — POCT GLYCOSYLATED HEMOGLOBIN (HGB A1C): HbA1c, POC (controlled diabetic range): 7.3 % — AB (ref 0.0–7.0)

## 2020-12-14 MED ORDER — OMEPRAZOLE 40 MG PO CPDR: 40.0000 mg | DELAYED_RELEASE_CAPSULE | Freq: Every day | ORAL | 3 refills | Status: DC

## 2020-12-14 MED ORDER — METFORMIN HCL 850 MG PO TABS: 850.0000 mg | ORAL_TABLET | Freq: Two times a day (BID) | ORAL | 11 refills | Status: DC

## 2020-12-14 MED ORDER — ATORVASTATIN CALCIUM 20 MG PO TABS: 20.0000 mg | ORAL_TABLET | Freq: Every day | ORAL | 2 refills | Status: DC

## 2020-12-14 MED ORDER — EMPAGLIFLOZIN 10 MG PO TABS: ORAL_TABLET | Freq: Every day | ORAL | 6 refills | Status: AC

## 2020-12-14 NOTE — Assessment & Plan Note (Signed)
Patient with periumbilical and lower abdominal pain  Check metabolic panel and blood counts  Obtain ultrasound of abdomen

## 2020-12-14 NOTE — Assessment & Plan Note (Signed)
As per eye specialist

## 2020-12-14 NOTE — Assessment & Plan Note (Signed)
Check urinalysis urine culture

## 2020-12-14 NOTE — Assessment & Plan Note (Signed)
Blood pressure still elevated at this time  Diastolic pressure is low systolic pressure only mildly elevated  Plan to monitor for now without medications

## 2020-12-14 NOTE — Patient Instructions (Addendum)
No change in medications medicines sent to your pharmacy  Complete set of screening labs urine study is obtained  Keep your mammogram appointment December 21  Keep your thyroid ultrasound appointment when it is scheduled  Abdominal ultrasound will be obtained  Return to see Dr. Delford Field 2 months    ?????? ?????  ?????? ????? ??????? ??????? ????? ?? 21 ??????  ????? ????? ??? ????? ??????? ???????? ??? ??????? ????? ??? ??????  ???? ?????? ??? ??????? ??? ??????? ?????  ?????? ????? ??????? ???? ????? la yujad taghyir fi al'adwiat alati yatimu 'iirsaluha 'iilaa alsaydaliat alkhasat bik tama alhusul ealaa majmueat kamilat min maeamil alfahs wadirasat albawl aihtafizi bimaweid altaswir alshueaeii lilthady fi 21 disambir aihtafaz bimaweid fahs alghudat aldaraqiat bialmawjat fawq alsawtiat eindama yatimu tahdiduh sayatimu alhusul ealaa almawjat fawq alsawtiat lilbatn aleawdat liruyat alduktur rayit shahrayn

## 2020-12-14 NOTE — Assessment & Plan Note (Signed)
Continue atorvastatin

## 2020-12-14 NOTE — Assessment & Plan Note (Signed)
Type 2 diabetes we will continue metformin and Jardiance

## 2020-12-14 NOTE — Assessment & Plan Note (Signed)
Thyroid levels normal has ultrasound pending

## 2020-12-14 NOTE — Assessment & Plan Note (Signed)
Continue management per eye physician

## 2020-12-15 LAB — COMPREHENSIVE METABOLIC PANEL
ALT: 20 IU/L (ref 0–32)
AST: 26 IU/L (ref 0–40)
Albumin/Globulin Ratio: 1.5 (ref 1.2–2.2)
Albumin: 4.4 g/dL (ref 3.7–4.7)
Alkaline Phosphatase: 112 IU/L (ref 44–121)
BUN/Creatinine Ratio: 26 (ref 12–28)
BUN: 26 mg/dL (ref 8–27)
Bilirubin Total: 0.4 mg/dL (ref 0.0–1.2)
CO2: 23 mmol/L (ref 20–29)
Calcium: 9.2 mg/dL (ref 8.7–10.3)
Chloride: 100 mmol/L (ref 96–106)
Creatinine, Ser: 1 mg/dL (ref 0.57–1.00)
Globulin, Total: 3 g/dL (ref 1.5–4.5)
Glucose: 119 mg/dL — ABNORMAL HIGH (ref 70–99)
Potassium: 4 mmol/L (ref 3.5–5.2)
Sodium: 137 mmol/L (ref 134–144)
Total Protein: 7.4 g/dL (ref 6.0–8.5)
eGFR: 60 mL/min/{1.73_m2} (ref >59)

## 2020-12-15 LAB — CBC WITH DIFFERENTIAL/PLATELET
Basophils Absolute: 0.1 10*3/uL (ref 0.0–0.2)
Basos: 1 %
EOS (ABSOLUTE): 0.1 10*3/uL (ref 0.0–0.4)
Eos: 2 %
Hematocrit: 31.3 % — ABNORMAL LOW (ref 34.0–46.6)
Hemoglobin: 9.4 g/dL — ABNORMAL LOW (ref 11.1–15.9)
Immature Grans (Abs): 0 10*3/uL (ref 0.0–0.1)
Immature Granulocytes: 0 %
Lymphocytes Absolute: 1.5 10*3/uL (ref 0.7–3.1)
Lymphs: 24 %
MCH: 24 pg — ABNORMAL LOW (ref 26.6–33.0)
MCHC: 30 g/dL — ABNORMAL LOW (ref 31.5–35.7)
MCV: 80 fL (ref 79–97)
Monocytes Absolute: 0.6 10*3/uL (ref 0.1–0.9)
Monocytes: 10 %
Neutrophils Absolute: 4.1 10*3/uL (ref 1.4–7.0)
Neutrophils: 63 %
Platelets: 277 10*3/uL (ref 150–450)
RBC: 3.92 x10E6/uL (ref 3.77–5.28)
RDW: 16.7 % — ABNORMAL HIGH (ref 11.7–15.4)
WBC: 6.5 10*3/uL (ref 3.4–10.8)

## 2020-12-15 LAB — URINALYSIS
Bilirubin, UA: NEGATIVE
Ketones, UA: NEGATIVE
Nitrite, UA: NEGATIVE
Protein,UA: NEGATIVE
RBC, UA: NEGATIVE
Specific Gravity, UA: 1.022 (ref 1.005–1.030)
Urobilinogen, Ur: 0.2 mg/dL (ref 0.2–1.0)
pH, UA: 6.5 (ref 5.0–7.5)

## 2020-12-15 LAB — LIPID PANEL
Chol/HDL Ratio: 2.1 ratio (ref 0.0–4.4)
Cholesterol, Total: 177 mg/dL (ref 100–199)
HDL: 84 mg/dL (ref >39)
LDL Chol Calc (NIH): 77 mg/dL (ref 0–99)
Triglycerides: 88 mg/dL (ref 0–149)
VLDL Cholesterol Cal: 16 mg/dL (ref 5–40)

## 2020-12-16 ENCOUNTER — Ambulatory Visit (INDEPENDENT_AMBULATORY_CARE_PROVIDER_SITE_OTHER): Payer: Medicare Other | Admitting: Orthopaedic Surgery

## 2020-12-16 ENCOUNTER — Other Ambulatory Visit: Payer: Self-pay

## 2020-12-16 ENCOUNTER — Encounter: Payer: Self-pay | Admitting: Orthopaedic Surgery

## 2020-12-16 DIAGNOSIS — M25571 Pain in right ankle and joints of right foot: Secondary | ICD-10-CM

## 2020-12-16 DIAGNOSIS — M5442 Lumbago with sciatica, left side: Secondary | ICD-10-CM

## 2020-12-16 DIAGNOSIS — M4807 Spinal stenosis, lumbosacral region: Secondary | ICD-10-CM | POA: Diagnosis not present

## 2020-12-16 DIAGNOSIS — M5441 Lumbago with sciatica, right side: Secondary | ICD-10-CM

## 2020-12-16 MED ORDER — MELOXICAM 15 MG PO TABS: 15.0000 mg | ORAL_TABLET | Freq: Every day | ORAL | 3 refills | Status: DC

## 2020-12-16 NOTE — Progress Notes (Signed)
The patient is a very pleasant 70 year old female regular patient of mine who I have replaced her right hip.  She is being seen in follow-up due to chronic low back pain.  This is to go over MRI of her lumbar spine if the failure of conservative treatment.  She is still unable to get any type of outpatient therapy and does have a home exercise program.  Her back pain seems to be consistent along the lower aspect of her lumbar spine.  Even on exam today that is where she hurts the most.  There is not a radicular component.  She is a diabetic but has tried to have good blood glucose control.  MRI of her lumbar spine shows some mild degenerative changes at multiple levels but no nerve compression.  There is no foraminal or spinal stenosis.  Even the facet joints do not appear to be overly arthritic.  I think only thing is can really help her back will be core strengthening exercises and occasional anti-inflammatories.  From my standpoint I will see her back in 3 months with a standing low AP pelvis so we can assess her right hip replacement.  She is Arabic and through her interpreter she did let me know that she has lesser toe deformities on both of her feet.  It would be appropriate to refer her to the podiatrist at the [facility] given the extent of her lesser toe deformities on both feet when she showed them to me.  They definitely have more expertise from a surgical and nonsurgical aspect when dealing with deformity such as these.

## 2020-12-17 ENCOUNTER — Ambulatory Visit
Admission: RE | Admit: 2020-12-17 | Discharge: 2020-12-17 | Disposition: A | Discharge status: Home / Self Care | Payer: Medicare Other | Source: Ambulatory Visit | Attending: Internal Medicine | Admitting: Internal Medicine

## 2020-12-17 ENCOUNTER — Telehealth: Payer: Self-pay

## 2020-12-17 DIAGNOSIS — E041 Nontoxic single thyroid nodule: Secondary | ICD-10-CM

## 2020-12-17 NOTE — Telephone Encounter (Signed)
-----   Message from Storm Frisk, MD sent at 12/15/2020  6:08 AM EST ----- Call pts son for results:   kidney, liver normal   she is slightly anemic, may be from abdominal pain, urine is abnormal, may be a UTI, waiting on urine culture and ultrasound results

## 2020-12-17 NOTE — Telephone Encounter (Signed)
Called pt's son and he is aware of results, DOB was confirmed

## 2020-12-19 LAB — URINE CULTURE

## 2020-12-21 ENCOUNTER — Ambulatory Visit: Payer: Medicare Other

## 2020-12-21 ENCOUNTER — Ambulatory Visit (INDEPENDENT_AMBULATORY_CARE_PROVIDER_SITE_OTHER): Payer: Medicare Other | Admitting: Podiatry

## 2020-12-21 ENCOUNTER — Other Ambulatory Visit: Payer: Self-pay

## 2020-12-21 DIAGNOSIS — M2041 Other hammer toe(s) (acquired), right foot: Secondary | ICD-10-CM | POA: Diagnosis not present

## 2020-12-21 DIAGNOSIS — M2042 Other hammer toe(s) (acquired), left foot: Secondary | ICD-10-CM | POA: Diagnosis not present

## 2020-12-21 DIAGNOSIS — M7750 Other enthesopathy of unspecified foot: Secondary | ICD-10-CM

## 2020-12-21 NOTE — Progress Notes (Signed)
°  Subjective:  Patient ID: Norma Clay, female    DOB: [birth year],  MRN: [id number]  Chief Complaint  Patient presents with   Lewie Chamber Toe   Diabetes   Tendonitis    70 y.o. female returns for follow-up with the above complaint. History confirmed with patient.  Here with a translator who translates from Arabic to [place].  Still having pain in the tips of the toes  Objective:  Physical Exam: warm, good capillary refill, no trophic changes or ulcerative lesions and normal DP and PT pulses.  Loss of protective sensation.  Bilaterally she has semirigid hammertoe deformities.  Callus to the tips of the toes These are painful.  Pes planus noted. Radiographs: X-ray of both feet: no fracture, dislocation, swelling or degenerative changes noted and digital contractures are present Assessment:   1. Hammertoe of right foot   2. Hammertoe of left foot      Plan:  Patient was evaluated and treated and all questions answered.  We again discussed correction of hammertoe deformities which she would prefer to avoid surgery.  I recommended nonsurgical treatment offloading with silicone pads which she will try.  I debrided the callus lesions as well today.  She will return to see me as needed.    Return if symptoms worsen or fail to improve.

## 2020-12-23 ENCOUNTER — Ambulatory Visit
Admission: RE | Admit: 2020-12-23 | Discharge: 2020-12-23 | Disposition: A | Discharge status: Home / Self Care | Payer: Medicare Other | Source: Ambulatory Visit | Attending: Critical Care Medicine | Admitting: Critical Care Medicine

## 2020-12-23 ENCOUNTER — Encounter: Payer: Self-pay | Admitting: Critical Care Medicine

## 2020-12-23 DIAGNOSIS — Z1231 Encounter for screening mammogram for malignant neoplasm of breast: Secondary | ICD-10-CM

## 2020-12-24 ENCOUNTER — Telehealth: Payer: Self-pay

## 2020-12-24 ENCOUNTER — Other Ambulatory Visit: Payer: Self-pay

## 2020-12-24 ENCOUNTER — Ambulatory Visit (HOSPITAL_COMMUNITY)
Admission: RE | Admit: 2020-12-24 | Discharge: 2020-12-24 | Disposition: A | Discharge status: Home / Self Care | Payer: Medicare Other | Source: Ambulatory Visit | Attending: Critical Care Medicine | Admitting: Critical Care Medicine

## 2020-12-24 DIAGNOSIS — R1030 Lower abdominal pain, unspecified: Secondary | ICD-10-CM | POA: Insufficient documentation

## 2020-12-24 NOTE — Telephone Encounter (Signed)
Pt was called and vm was left, Information has been sent to nurse pool.    Interpreter: Pearletha Alfred

## 2020-12-24 NOTE — Telephone Encounter (Signed)
-----   Message from Storm Frisk, MD sent at 12/24/2020  8:43 AM EST ----- Let pt know mammogram negative recheck one year

## 2020-12-25 ENCOUNTER — Telehealth: Payer: Self-pay

## 2020-12-25 NOTE — Telephone Encounter (Signed)
-----   Message from Storm Frisk, MD sent at 12/25/2020  7:37 AM EST ----- Let pt (call he son speaks english) that abdominal US was NORMAL

## 2020-12-25 NOTE — Telephone Encounter (Signed)
Pt's son was called and vm was left, Information has been sent to nurse pool.

## 2020-12-26 ENCOUNTER — Encounter: Payer: Self-pay | Admitting: Critical Care Medicine

## 2021-03-15 ENCOUNTER — Ambulatory Visit: Payer: Medicare Other | Admitting: Physician Assistant

## 2021-04-03 ENCOUNTER — Other Ambulatory Visit: Payer: Self-pay | Admitting: Orthopaedic Surgery

## 2021-04-26 DIAGNOSIS — E119 Type 2 diabetes mellitus without complications: Secondary | ICD-10-CM | POA: Diagnosis not present

## 2021-04-26 DIAGNOSIS — H401113 Primary open-angle glaucoma, right eye, severe stage: Secondary | ICD-10-CM | POA: Diagnosis not present

## 2021-04-26 DIAGNOSIS — Z961 Presence of intraocular lens: Secondary | ICD-10-CM | POA: Diagnosis not present

## 2021-04-26 DIAGNOSIS — H401121 Primary open-angle glaucoma, left eye, mild stage: Secondary | ICD-10-CM | POA: Diagnosis not present

## 2021-04-26 DIAGNOSIS — H26492 Other secondary cataract, left eye: Secondary | ICD-10-CM | POA: Diagnosis not present

## 2021-05-17 NOTE — Telephone Encounter (Signed)
error 

## 2021-07-13 DIAGNOSIS — K219 Gastro-esophageal reflux disease without esophagitis: Secondary | ICD-10-CM | POA: Insufficient documentation

## 2021-07-13 DIAGNOSIS — E663 Overweight: Secondary | ICD-10-CM | POA: Insufficient documentation

## 2021-07-14 ENCOUNTER — Other Ambulatory Visit: Payer: Self-pay | Admitting: Orthopaedic Surgery

## 2021-08-04 DIAGNOSIS — E782 Mixed hyperlipidemia: Secondary | ICD-10-CM | POA: Insufficient documentation

## 2021-11-18 ENCOUNTER — Other Ambulatory Visit: Payer: Self-pay

## 2022-01-01 ENCOUNTER — Other Ambulatory Visit: Payer: Self-pay | Admitting: Internal Medicine

## 2022-01-18 ENCOUNTER — Other Ambulatory Visit: Payer: Self-pay | Admitting: Internal Medicine

## 2022-01-20 ENCOUNTER — Other Ambulatory Visit: Payer: Self-pay | Admitting: Internal Medicine

## 2022-03-10 ENCOUNTER — Encounter: Payer: Self-pay | Admitting: Radiology

## 2022-06-13 ENCOUNTER — Ambulatory Visit: Payer: Self-pay

## 2022-06-13 NOTE — Telephone Encounter (Addendum)
Message from [facility] sent at 06/13/2022  2:16 PM EDT  Summary: experiencing coughing, body aches going for about two weeks.   Pt son stated pt is experiencing coughing, body aches going for about two weeks. Requesting appointment with PCP no appointments available.  Provided details for mobile unit.  Seeking clinical advice.        LM on VM to call back to discuss sx. Third attempt and unable to reach son.  Reason for Disposition . Third attempt to contact caller AND no contact made. Phone number verified.  Protocols used: No Contact or Duplicate Contact Call-A-AH

## 2022-06-13 NOTE — Telephone Encounter (Signed)
Message from [facility] sent at 06/13/2022  2:16 PM EDT  Summary: experiencing coughing, body aches going for about two weeks.   Pt son stated pt is experiencing coughing, body aches going for about two weeks. Requesting appointment with PCP no appointments available.  Provided details for mobile unit.  Seeking clinical advice.        Called and LM on son Hussien's VM to call back to discuss pt's sx.

## 2022-06-13 NOTE — Telephone Encounter (Signed)
Message from [facility] sent at 06/13/2022  2:16 PM EDT  Summary: experiencing coughing, body aches going for about two weeks.   Pt son stated pt is experiencing coughing, body aches going for about two weeks. Requesting appointment with PCP no appointments available.  Provided details for mobile unit.  Seeking clinical advice.        Using Arabic interpreter Mariam id [phone] called Hussein number x 2 and call was answered bit heard nothing and no response on the other end. Called daughter Lina Sar and [place] full.

## 2022-06-13 NOTE — Telephone Encounter (Signed)
4th call Call placed to patient unable to reach message left on VM.

## 2022-06-14 ENCOUNTER — Ambulatory Visit
Admission: RE | Admit: 2022-06-14 | Discharge: 2022-06-14 | Disposition: A | Discharge status: Home / Self Care | Payer: Medicare HMO | Source: Ambulatory Visit | Attending: Nurse Practitioner | Admitting: Nurse Practitioner

## 2022-06-14 ENCOUNTER — Encounter: Payer: Self-pay | Admitting: Nurse Practitioner

## 2022-06-14 ENCOUNTER — Ambulatory Visit: Payer: Medicare HMO | Attending: Nurse Practitioner | Admitting: Nurse Practitioner

## 2022-06-14 VITALS — BP 134/83 | HR 84 | Ht 66.0 in | Wt 169.0 lb

## 2022-06-14 DIAGNOSIS — J301 Allergic rhinitis due to pollen: Secondary | ICD-10-CM | POA: Diagnosis not present

## 2022-06-14 DIAGNOSIS — R053 Chronic cough: Secondary | ICD-10-CM | POA: Diagnosis not present

## 2022-06-14 DIAGNOSIS — R350 Frequency of micturition: Secondary | ICD-10-CM | POA: Diagnosis not present

## 2022-06-14 MED ORDER — FLUTICASONE PROPIONATE 50 MCG/ACT NA SUSP
2.00 | Freq: Every day | NASAL | 0 refills | Status: DC
Start: 2022-06-14 — End: 2022-07-08

## 2022-06-14 MED ORDER — CLOTRIMAZOLE 1 % EX CREA: 1.0000 | TOPICAL_CREAM | Freq: Two times a day (BID) | CUTANEOUS | 1 refills | Status: DC

## 2022-06-14 NOTE — Progress Notes (Signed)
Assessment & Plan:  Norma Clay Clay was seen today for cough, emesis and abdominal pain.  Diagnoses and all orders for this visit:  Chronic cough -     CBC with Differential -     Basic metabolic panel -     DG Chest 2 View; Future  Allergic rhinitis due to pollen, unspecified seasonality -     fluticasone (FLONASE) 50 MCG/ACT nasal spray; Place 2 sprays into both nostrils daily.  Urine frequency -     Urinalysis, Complete  Other orders -     clotrimazole (LOTRIMIN) 1 % cream; Apply 1 Application topically 2 (two) times daily. To both feet at base of toes    Patient has been counseled on age-appropriate routine health concerns for screening and prevention. These are reviewed and up-to-date. Referrals have been placed accordingly. Immunizations are up-to-date or declined.    Subjective:   Chief Complaint  Patient presents with   Cough   Emesis   Abdominal Pain   HPI Norma Clay [age] y.o. female presents to office today for chronic cough.  Norma Clay Clay is accompanied by her daughter who is translating for her.    Cough: Patient complains of nonproductive cough and sore throat.  Symptoms began over 1 month ago.  The cough is non-productive, without wheezing, dyspnea or hemoptysis and is aggravated by nothing Patient does not have new pets. Patient does not have a history of asthma. Patient does not have a history of environmental allergens. Patient did not have recent travel. Patient does not have a history of smoking. Patient  does not have previous Chest X-ray. Patient did not have had a PPD done.  Norma Clay Clay has a previous history of GERD.  Daughter states that patient's granddaughter who lives in the home is well had a cold for a few weeks prior to her mother getting sick.  Norma Clay Clay has been taking an over the counter cough syrup for diabetic patients. Norma Clay Clay did not take a COVID test. Denies fatigue, malaise, fever, hemoptysis.  Endorses urinary frequency and the abdominal pain but unsure if Norma Clay Clay is  unsure if can give a urine sample today.  Review of Systems  Constitutional:  Negative for fever, malaise/fatigue and weight loss.  HENT:  Positive for congestion and sore throat. Negative for ear discharge, ear pain, hearing loss, nosebleeds, sinus pain and tinnitus.   Eyes:  Positive for redness (Right eye). Negative for blurred vision, double vision, photophobia, pain and discharge.  Respiratory:  Positive for cough. Negative for hemoptysis, sputum production, shortness of breath, wheezing and stridor.   Cardiovascular: Negative.  Negative for chest pain, palpitations and leg swelling.  Gastrointestinal:  Positive for abdominal pain. Negative for blood in stool, constipation, diarrhea, heartburn, melena, nausea and vomiting.  Genitourinary:  Positive for frequency. Negative for dysuria, flank pain, hematuria and urgency.  Musculoskeletal: Negative.  Negative for myalgias.  Neurological: Negative.  Negative for dizziness, focal weakness, seizures and headaches.  Endo/Heme/Allergies:  Positive for environmental allergies.  Psychiatric/Behavioral: Negative.  Negative for suicidal ideas.     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   Outpatient Medications Prior to Visit  Medication Sig Dispense Refill   atorvastatin (LIPITOR) 20 MG tablet Take 1 tablet (20 mg total) by mouth daily. 90 tablet 2   Blood Glucose Monitoring Suppl (ACCU-CHEK GUIDE ME) w/Device KIT Use to check TID. 1 kit 0   calcium carbonate (OS-CAL) 1250 (500 Ca) MG chewable tablet Chew 1 tablet (1,250 mg total) by mouth 3 (three) times daily. 270 tablet 3   Cholecalciferol (VITAMIN D3) 10 MCG (400 UNIT) tablet Take 1 tablet (400 Units total) by mouth daily. 90 tablet 3   dorzolamide-timolol (COSOPT) 22.3-6.8 MG/ML ophthalmic solution Place 1 drop into both eyes 2 (two) times daily 40 mL 3   levothyroxine (SYNTHROID) 88 MCG tablet Take 1 tablet (88 mcg total) by mouth daily. 90 tablet 3   meloxicam (MOBIC) 15 MG tablet TAKE 1 TABLET (15 MG TOTAL) BY MOUTH DAILY. 30 tablet 2   metFORMIN (GLUCOPHAGE) 850 MG tablet Take 1 tablet (850 mg total) by mouth 2 (two) times daily with a meal. 60 tablet 11   Travoprost, BAK Free, (TRAVATAN) 0.004 % SOLN ophthalmic solution Place 1 drop into both eyes nightly 2.5 mL 6   acetaminophen (TYLENOL) 325 MG tablet Take 1-2 tablets (325-650 mg total) by mouth every 6 (six) hours as needed for moderate pain. (Patient not taking: Reported on 06/14/2022) 30 tablet 0   omeprazole (PRILOSEC) 40 MG capsule Take 1 capsule (40 mg total) by mouth daily. (Patient not taking: Reported on 06/14/2022) 30 capsule 3   clotrimazole (LOTRIMIN) 1 % cream Apply 1 application topically 2 (two) times daily. To both feet at base of toes (Patient not taking: Reported on 06/14/2022) 30 g 0   No facility-administered medications prior to visit.    Allergies  Allergen Reactions   Clindamycin Rash   Penicillins Rash    Patient was placed on a form of penicillin by her dentist and developed a rash therefore medication was changed to clindamycin.  Per son, patient is not allergic to clindamycin  Patient was placed on a form of penicillin by her  dentist and developed a rash therefore medication was changed to clindamycin.  Per son, patient is not allergic to clindamycin Patient was placed on a form of penicillin by her dentist and developed a rash therefore medication was changed to clindamycin.  Per son, patient is not allergic to clindamycin    Patient was placed on a form of penicillin by her dentist and developed a rash therefore medication was changed to clindamycin.  Per son, patient is not allergic to clindamycin       Objective:    BP 134/83 (BP Location: Left Arm, Patient Position: Sitting, Cuff Size: Normal)   Pulse 84   Ht 5\' 6"  (1.676 m)   Wt 169 lb (76.7 kg)   SpO2 97%   BMI 27.28 kg/m  Wt Readings from Last 3 Encounters:  06/14/22 169 lb (76.7 kg)  12/14/20 159 lb 3.2 oz (72.2 kg)  12/04/20 146 lb (66.2 kg)  Physical Exam Vitals and nursing note reviewed.  Constitutional:      Appearance: Norma Clay Clay is well-developed.  HENT:     Head: Normocephalic and atraumatic.     Nose:     Right Turbinates: Enlarged, swollen and pale.     Left Turbinates: Enlarged, swollen and pale.  Eyes:     Conjunctiva/sclera:     Right eye: Right conjunctiva is injected.  Cardiovascular:     Rate and Rhythm: Normal rate and regular rhythm.     Heart sounds: Normal heart sounds. No murmur heard.    No friction rub. No gallop.  Pulmonary:     Effort: Pulmonary effort is normal. No tachypnea or respiratory distress.     Breath sounds: Normal breath sounds. No decreased breath sounds, wheezing, rhonchi or rales.  Chest:     Chest wall: No tenderness.  Abdominal:     General: Bowel sounds are normal.     Palpations: Abdomen is soft.  Musculoskeletal:        General: Normal range of motion.     Cervical back: Normal range of motion.  Skin:    General: Skin is warm and dry.  Neurological:     Mental Status: Norma Clay Clay is alert and oriented to person, place, and time.     Coordination: Coordination normal.  Psychiatric:         Behavior: Behavior normal. Behavior is cooperative.        Thought Content: Thought content normal.        Judgment: Judgment normal.          Patient has been counseled extensively about nutrition and exercise as well as the importance of adherence with medications and regular follow-up. The patient was given clear instructions to go to ER or return to medical center if symptoms don't improve, worsen or new problems develop. The patient verbalized understanding.   Follow-up: Return if symptoms worsen or fail to improve.   Claiborne Rigg, FNP-BC [facility] [place], [place] [phone]   06/14/2022, 5:06 PM

## 2022-06-15 LAB — BASIC METABOLIC PANEL
BUN/Creatinine Ratio: 28 (ref 12–28)
BUN: 31 mg/dL — ABNORMAL HIGH (ref 8–27)
CO2: 22 mmol/L (ref 20–29)
Calcium: 9.5 mg/dL (ref 8.7–10.3)
Chloride: 100 mmol/L (ref 96–106)
Creatinine, Ser: 1.1 mg/dL — ABNORMAL HIGH (ref 0.57–1.00)
Glucose: 139 mg/dL — ABNORMAL HIGH (ref 70–99)
Potassium: 4.6 mmol/L (ref 3.5–5.2)
Sodium: 137 mmol/L (ref 134–144)
eGFR: 53 mL/min/{1.73_m2} — ABNORMAL LOW (ref >59)

## 2022-06-15 LAB — CBC WITH DIFFERENTIAL/PLATELET
Basophils Absolute: 0 10*3/uL (ref 0.0–0.2)
Basos: 1 %
EOS (ABSOLUTE): 0.1 10*3/uL (ref 0.0–0.4)
Eos: 2 %
Hematocrit: 41.2 % (ref 34.0–46.6)
Hemoglobin: 13.5 g/dL (ref 11.1–15.9)
Immature Grans (Abs): 0 10*3/uL (ref 0.0–0.1)
Immature Granulocytes: 0 %
Lymphocytes Absolute: 1.6 10*3/uL (ref 0.7–3.1)
Lymphs: 26 %
MCH: 31.1 pg (ref 26.6–33.0)
MCHC: 32.8 g/dL (ref 31.5–35.7)
MCV: 95 fL (ref 79–97)
Monocytes Absolute: 0.6 10*3/uL (ref 0.1–0.9)
Monocytes: 10 %
Neutrophils Absolute: 3.6 10*3/uL (ref 1.4–7.0)
Neutrophils: 61 %
Platelets: 213 10*3/uL (ref 150–450)
RBC: 4.34 x10E6/uL (ref 3.77–5.28)
RDW: 11.8 % (ref 11.7–15.4)
WBC: 6 10*3/uL (ref 3.4–10.8)

## 2022-06-20 ENCOUNTER — Ambulatory Visit (INDEPENDENT_AMBULATORY_CARE_PROVIDER_SITE_OTHER): Payer: Medicare HMO | Admitting: Internal Medicine

## 2022-06-20 VITALS — BP 150/80 | Ht 64.5 in | Wt 169.6 lb

## 2022-06-20 DIAGNOSIS — Z7984 Long term (current) use of oral hypoglycemic drugs: Secondary | ICD-10-CM

## 2022-06-20 DIAGNOSIS — E042 Nontoxic multinodular goiter: Secondary | ICD-10-CM | POA: Diagnosis not present

## 2022-06-20 DIAGNOSIS — E892 Postprocedural hypoparathyroidism: Secondary | ICD-10-CM

## 2022-06-20 DIAGNOSIS — E119 Type 2 diabetes mellitus without complications: Secondary | ICD-10-CM

## 2022-06-20 LAB — POCT GLYCOSYLATED HEMOGLOBIN (HGB A1C): Hemoglobin A1C: 7.3 % — AB (ref 4.0–5.6)

## 2022-06-20 LAB — TSH: TSH: 1.11 u[IU]/mL (ref 0.35–5.50)

## 2022-06-20 MED ORDER — CALCIUM CARBONATE 1250 (500 CA) MG PO CHEW: 1.0000 | CHEWABLE_TABLET | Freq: Three times a day (TID) | ORAL | 3 refills | Status: AC

## 2022-06-20 MED ORDER — METFORMIN HCL 850 MG PO TABS
850.0000 mg | ORAL_TABLET | Freq: Two times a day (BID) | ORAL | 3 refills | Status: DC
Start: 2022-06-20 — End: 2022-12-26

## 2022-06-20 MED ORDER — CALCITRIOL 0.25 MCG PO CAPS: 0.5000 ug | ORAL_CAPSULE | Freq: Every day | ORAL | 3 refills | Status: DC

## 2022-06-20 MED ORDER — EMPAGLIFLOZIN 25 MG PO TABS
25.0000 mg | ORAL_TABLET | Freq: Every day | ORAL | 3 refills | Status: DC
Start: 2022-06-20 — End: 2022-12-26

## 2022-06-20 NOTE — Patient Instructions (Signed)
-   Continue Calcium Carbonate  1 tablet three times daily for now  - Continue Calcitriol two capsules a day  - Continue Over the counter Vitamin D3 400 iu daily  - Continue Levothyroxine 88 mcg daily        

## 2022-06-20 NOTE — Progress Notes (Unsigned)
Name: Norma Clay  MRN/ DOB: [id number], 05/04/1950    Age/ Sex: 72 y.o., female     PCP: Storm Frisk, MD   Reason for Endocrinology Evaluation: Post-surgical hypoparathyroidism     Initial Endocrinology Clinic Visit: 12/08/17    PATIENT IDENTIFIER: Norma Clay is a 72 y.o., female with a past medical history of Total thyroidectomy and T2DM. She has followed with [facility] since 12/08/2017 for consultative assistance with management of her Post-surgical hypoparathyroidism.   HISTORICAL SUMMARY:  She  moved from [place] in 2019. 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 She denies history of thyroid cancer. Pt noted hand spasms following 2nd surgery and has been on Calcium and Calcitriol since. Historically has compliance issues which results in serum calcium fluctuations.  24-hr urine collection normal 06/2018  She is s/p right thyroid nodule FNA while in [place] with benign cytology per patient 08/2021   DIABETES HISTORY: She has been diagnosed with diabetes many years ago.  She has been on metformin, she used to be on glimepiride, Jardiance was started in 2022. Her A1c has ranged from 6.9% in 2020, peaking at 10.0% in 2018    SUBJECTIVE:   Today (06/20/2022):  Norma Clay is here for a follow up on hypocalcemia and hypothyroidism.  She is accompanied by her daughter on Monday.  She has NOT been to our clinic in 18 months, as she moved to [place] and is now back in [place].  Since her last visit here she has had a repeat thyroid ultrasound requiring FNA of her right thyroid nodule while living in [place], per patient, biopsy was benign, records not available  She continues to complain of throat discomfort which is chronic in nature She is complaining of eye discharge, had an ophthalmology exam 04/2022  Has occasional constipation -she was prescribed MiraLAX by her PCP in [place] She  was seen by her PCP for URI, she continues to complain about cough but it is improving, but continues to complain of pruritus of the scalp and the nose, she is asking that I check her ears today  Has ankle edema  She has had recent labs done through her PCPs office which were reviewed She is c/o eye discharge, was seen by ophthalmology in April   She has general pruritus over scalp , nose and skin - most likely allergies   Medication: Calcium carbonate (oyster shell) 500 mg, 1 tabs TID Calcitriol 0.25 MCG, 2 caps daily Vitamin D3 400 IU daily Levothyroxine 88 mcg daily  Jardiance 10 mg daily Metformin 850 twice daily   HISTORY:  Past Medical History:  Past Medical History:  Diagnosis Date   Diabetes mellitus without complication (HCC)    Glaucoma    Hypertension    Osteoarthritis of right hip 05/16/2016   Posterior capsular opacification of both eyes, 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 She has never used smokeless tobacco. She reports that she does not drink alcohol  and does not use drugs. Family History: family history includes Diabetes in her mother.   HOME MEDICATIONS: Allergies as of 06/20/2022       Reactions   Clindamycin Rash   Penicillins Rash   Patient was placed on a form of penicillin by her dentist and developed a rash therefore medication was changed to clindamycin.  Per son, patient is not allergic to clindamycin Patient was placed on a form of penicillin by her dentist and developed a rash therefore medication was changed to  clindamycin.  Per son, patient is not allergic to clindamycin Patient was placed on a form of penicillin by her dentist and developed a rash therefore medication was changed to clindamycin.  Per son, patient is not allergic to clindamycin    Patient was placed on a form of penicillin by her dentist and developed a rash therefore medication was changed to clindamycin.  Per son, patient is not allergic to clindamycin        Medication List        Accurate as of June 20, 2022 10:57 AM. If you have any questions, ask your nurse or doctor.          Accu-Chek Guide Me w/Device Kit Use to check TID.   acetaminophen 325 MG tablet Commonly known as: Tylenol Take 1-2 tablets (325-650 mg total) by mouth every 6 (six) hours as needed for moderate pain.   atorvastatin 20 MG tablet Commonly known as: LIPITOR Take 1 tablet (20 mg total) by mouth daily.   brimonidine 0.2 % ophthalmic solution Commonly known as: ALPHAGAN Place 1 drop into the right eye 2 (two) times daily.   calcitRIOL 0.25 MCG capsule Commonly known as: ROCALTROL TAKE 1 CAPSULE ORALLY THREE TIMES A WEEK 30 DAYS   calcium carbonate 1250 (500 Ca) MG chewable tablet Commonly known as: OS-CAL Chew 1 tablet (1,250 mg total) by mouth 3 (three) times daily.   clotrimazole 1 % cream Commonly known as: LOTRIMIN Apply 1 Application topically 2 (two) times daily. To both feet at base of toes   dorzolamide-timolol 2-0.5 % ophthalmic solution Commonly known as: COSOPT Place 1 drop into both eyes 2 (two) times daily   ferrous sulfate 325 (65 FE) MG tablet Take by mouth.   fluticasone 50 MCG/ACT nasal spray Commonly known as: FLONASE Place 2 sprays into both nostrils daily.   FreeStyle Libre 3 Sensor Misc USE 1 EACH EVERY 14 (FOURTEEN) DAYS.   Jardiance 10 MG Tabs tablet Generic drug: empagliflozin TAKE 1 TABLET (10 MG TOTAL) BY MOUTH DAILY FOR TYPE 2 DIABETES MELLITUS   levothyroxine 88 MCG tablet Commonly known as:  SYNTHROID Take 1 tablet (88 mcg total) by mouth daily.   meloxicam 15 MG tablet Commonly known as: MOBIC TAKE 1 TABLET (15 MG TOTAL) BY MOUTH DAILY.   metFORMIN 850 MG tablet Commonly known as: GLUCOPHAGE Take 1 tablet (850 mg total) by mouth 2 (two) times daily with a meal.   omeprazole 40 MG capsule Commonly known as: PRILOSEC Take 1 capsule (40 mg total) by mouth daily.   Travatan Z 0.004 % Soln ophthalmic solution Generic drug: Travoprost (BAK Free) Place 1 drop into both eyes nightly   Vitamin D3 10 MCG (400 UNIT) tablet Take 1 tablet (400 Units total) by mouth daily.          OBJECTIVE:   PHYSICAL EXAM: VS:BP (!) 150/80 (BP Location: Left Arm, Patient Position: Sitting, Cuff Size: Normal)   Ht 5' 4.5" (1.638 m)  Wt 169 lb 9.6 oz (76.9 kg)   BMI 28.66 kg/m   EXAM: General: Pt appears well and is in NAD Bilateral external auditory canals clear with intact tympanic membrane No nodules appreciated on thyroid exam  Lungs: Clear with good BS bilat with no rales, rhonchi, or wheezes  Heart: Auscultation: RRR.  Extremities: No pretibial edema   Mental Status: Judgment, insight: Intact Mood and affect: No depression, anxiety, or agitation     DATA REVIEWED:   Latest Reference Range & Units 06/14/22 16:50  Sodium 134 - 144 mmol/L 137  Potassium 3.5 - 5.2 mmol/L 4.6  Chloride 96 - 106 mmol/L 100  CO2 20 - 29 mmol/L 22  Glucose 70 - 99 mg/dL 409 (H)  BUN 8 - 27 mg/dL 31 (H)  Creatinine 8.11 - 1.00 mg/dL 9.14 (H)  Calcium 8.7 - 10.3 mg/dL 9.5  BUN/Creatinine Ratio 12 - 28  28  eGFR >59 mL/min/1.73 53 (L)  (H): Data is abnormally high (L): Data is abnormally low    Latest Reference Range & Units 06/14/22 16:50  Sodium 134 - 144 mmol/L 137  Potassium 3.5 - 5.2 mmol/L 4.6  Chloride 96 - 106 mmol/L 100  CO2 20 - 29 mmol/L 22  Glucose 70 - 99 mg/dL 782 (H)  BUN 8 - 27 mg/dL 31 (H)  Creatinine 9.56 - 1.00 mg/dL 2.13 (H)  Calcium 8.7 - 10.3 mg/dL 9.5   BUN/Creatinine Ratio 12 - 28  28  eGFR >59 mL/min/1.73 53 (L)     Thyroid ultrasound 08/03/2021 [facility]  There is a 2 cm TR 4 nodule in the midpole region right lobe.  Recommend FNA There is 11 mm TR 3 nodule in the thyroid isthmus Left lobe absent    Thyroid ULtrasound 12/2019 The thyroid gland is surgically absent. Evaluation of the left resection bed demonstrates no evidence of residual or recurrent thyroid tissue.   However, evaluation of the right thyroid resection bed demonstrates a recurrent rounded nodular focus of soft tissue measuring 2.2 x 1.3 x 1.1 cm. The structure is solid and mildly heterogeneous in appearance. No internal calcifications or punctate echogenic foci. No suspicious lymphadenopathy.   IMPRESSION: 1. Recurrent nodule in the right thyroid resection bed. If the patient's prior thyroidectomy was performed for malignancy, then fine-needle aspiration biopsy is recommended. 2. Surgical changes of thyroidectomy without evidence of recurrent thyroid tissue or nodule in the left resection bed.     Old records , labs and images have been reviewed.    ASSESSMENT / PLAN / RECOMMENDATIONS:   Surgical Hypoparathyroidism :    - Pt is asymptomatic  - Calcium level is normal  -24-hour urine collection was acceptable at 213 on 06/03/2020, this was 166 in 2020   Medications:  Continue Calcium Carbonate 500 mg 1 tabs TID Continue  Calcitriol 0.25 mcg ( 2 Caps ) daily   Continue Vitamin D3 400 iu daily     2. Postsurgical hypothyroidism :  - Clinically euthyroid  - TSH is within normal range   Continue levothyroxine 88 mcg daily     3. Multinodular Goiter:  -S/p left thyroide[place] due to benign pathology -Thyroid ultrasound 12/17/2020 showed isthmic and a right thyroid nodule meeting criteria for 1 year follow-up -She had a repeat thyroid ultrasound 08/2021 while living in [place], she is s/p benign FNA of  the right nodule 08/2021 per patient -Will proceed with thyroid ultrasound   4. T2DM, Sub-optimally Controlled   -  Historically this has been managed by her PCP -She brought in her CGM today, we were unable to download, but the patient has been noted with postprandial hyperglycemia -We will continue metformin as below but increase Jardiance  Medication Continue metformin 850 mg twice daily Increase Jardiance 25 mg daily   F/u in 6 months     I spent 42 minutes preparing to see the patient by review of recent labs, imaging and procedures, obtaining and reviewing separately obtained history, communicating with the patient/family or caregiver, ordering medications, tests or procedures, and documenting clinical information in the EHR including the differential Dx, treatment, and any further evaluation and other management     Signed electronically by: Lyndle Herrlich, MD  [facility] Endocrinology  [facility] [address] [place] [place], [place] [address] Phone: [phone] FAX: [phone]      CC: Storm Frisk, MD [address] [place] [address] Phone: [phone]  Fax: [phone]   Return to Endocrinology clinic as below: No future appointments.

## 2022-06-22 ENCOUNTER — Other Ambulatory Visit: Payer: Self-pay | Admitting: Nurse Practitioner

## 2022-06-22 DIAGNOSIS — J4520 Mild intermittent asthma, uncomplicated: Secondary | ICD-10-CM

## 2022-06-22 MED ORDER — OMEPRAZOLE 40 MG PO CPDR
40.0000 mg | DELAYED_RELEASE_CAPSULE | Freq: Every day | ORAL | 3 refills | Status: AC
Start: 2022-06-22 — End: ?

## 2022-06-22 MED ORDER — LEVOTHYROXINE SODIUM 88 MCG PO TABS: 88.0000 ug | ORAL_TABLET | Freq: Every day | ORAL | 3 refills | Status: DC

## 2022-06-22 MED ORDER — ALBUTEROL SULFATE HFA 108 (90 BASE) MCG/ACT IN AERS
2.0000 | INHALATION_SPRAY | Freq: Four times a day (QID) | RESPIRATORY_TRACT | 0 refills | Status: DC | PRN
Start: 2022-06-22 — End: 2022-07-22

## 2022-06-24 ENCOUNTER — Telehealth: Payer: Self-pay

## 2022-06-24 ENCOUNTER — Ambulatory Visit
Admission: RE | Admit: 2022-06-24 | Discharge: 2022-06-24 | Disposition: A | Discharge status: Home / Self Care | Payer: Medicare HMO | Source: Ambulatory Visit | Attending: Internal Medicine | Admitting: Internal Medicine

## 2022-06-24 DIAGNOSIS — E042 Nontoxic multinodular goiter: Secondary | ICD-10-CM

## 2022-06-24 NOTE — Telephone Encounter (Signed)
-----   Message from Claiborne Rigg, NP sent at 06/22/2022  2:49 PM EDT ----- Chest xray negative for PNA or bronchitis or any inflammatory lung disease. White blood count normal and no indication of bacterial infection. Recommend inhaler and resume acid reflux medication. I have sent both to pharmacy

## 2022-06-24 NOTE — Telephone Encounter (Signed)
Pt son who is on DPR  was called and is aware of results, DOB was confirmed. 

## 2022-07-08 ENCOUNTER — Other Ambulatory Visit: Payer: Self-pay | Admitting: Nurse Practitioner

## 2022-07-08 DIAGNOSIS — J301 Allergic rhinitis due to pollen: Secondary | ICD-10-CM

## 2022-07-22 ENCOUNTER — Other Ambulatory Visit: Payer: Self-pay | Admitting: Nurse Practitioner

## 2022-07-22 DIAGNOSIS — J4520 Mild intermittent asthma, uncomplicated: Secondary | ICD-10-CM

## 2022-09-01 ENCOUNTER — Other Ambulatory Visit: Payer: Self-pay | Admitting: Physician Assistant

## 2022-09-01 DIAGNOSIS — J3801 Paralysis of vocal cords and larynx, unilateral: Secondary | ICD-10-CM

## 2022-09-01 DIAGNOSIS — R1310 Dysphagia, unspecified: Secondary | ICD-10-CM

## 2022-09-19 ENCOUNTER — Ambulatory Visit
Admission: RE | Admit: 2022-09-19 | Discharge: 2022-09-19 | Disposition: A | Discharge status: Home / Self Care | Payer: Medicare HMO | Source: Ambulatory Visit | Attending: Physician Assistant | Admitting: Physician Assistant

## 2022-09-19 DIAGNOSIS — J3801 Paralysis of vocal cords and larynx, unilateral: Secondary | ICD-10-CM

## 2022-09-19 MED ORDER — IOPAMIDOL (ISOVUE-300) INJECTION 61%
500.0000 mL | Freq: Once | INTRAVENOUS | Status: AC | PRN
  Administered 2022-09-19: 75 mL via INTRAVENOUS

## 2022-09-22 ENCOUNTER — Ambulatory Visit
Admission: RE | Admit: 2022-09-22 | Discharge: 2022-09-22 | Disposition: A | Discharge status: Home / Self Care | Payer: Medicare HMO | Source: Ambulatory Visit | Attending: Physician Assistant | Admitting: Physician Assistant

## 2022-09-22 DIAGNOSIS — R1310 Dysphagia, unspecified: Secondary | ICD-10-CM

## 2022-09-30 ENCOUNTER — Other Ambulatory Visit: Payer: Self-pay | Admitting: Nurse Practitioner

## 2022-09-30 DIAGNOSIS — J4520 Mild intermittent asthma, uncomplicated: Secondary | ICD-10-CM

## 2022-09-30 NOTE — Telephone Encounter (Signed)
Courtesy refill. Patient will need an office visit for further refills. Requested Prescriptions  Pending Prescriptions Disp Refills   omeprazole (PRILOSEC) 40 MG capsule [Pharmacy Med Name: OMEPRAZOLE DR 40 MG CAPSULE] 30 capsule 0    Sig: TAKE 1 CAPSULE (40 MG TOTAL) BY MOUTH DAILY.     Gastroenterology: Proton Pump Inhibitors Passed - 09/30/2022  1:36 AM      Passed - Valid encounter within last 12 months    Recent Outpatient Visits           3 months ago Chronic cough   [facility] [place], [place] W, NP   1 year ago Type 2 diabetes mellitus without complication, without long-term current use of insulin [facility])   [place] [facility] Storm Frisk, MD   2 years ago Type 2 diabetes mellitus without complication, without long-term current use of insulin [facility])   [place] [facility] Storm Frisk, MD   2 years ago Essential hypertension    [facility] Storm Frisk, MD   2 years ago Type 2 diabetes mellitus without complication, without long-term current use of insulin [facility])   [facility] [place], Cornelius Moras, RPH-CPP

## 2022-09-30 NOTE — Telephone Encounter (Signed)
Called pt to make follow up appt. Spoke with Son, placed me on hold and did not return to phone for 10 minutes. Son is aware that pt needs ov.

## 2022-10-05 ENCOUNTER — Other Ambulatory Visit: Payer: Self-pay | Admitting: Critical Care Medicine

## 2022-10-05 DIAGNOSIS — J4520 Mild intermittent asthma, uncomplicated: Secondary | ICD-10-CM

## 2022-10-25 ENCOUNTER — Ambulatory Visit: Payer: Medicare HMO | Attending: Critical Care Medicine

## 2022-10-25 VITALS — Ht 64.5 in | Wt 169.0 lb

## 2022-10-25 DIAGNOSIS — Z0000 Encounter for general adult medical examination without abnormal findings: Secondary | ICD-10-CM | POA: Diagnosis not present

## 2022-10-25 DIAGNOSIS — Z1231 Encounter for screening mammogram for malignant neoplasm of breast: Secondary | ICD-10-CM

## 2022-10-25 NOTE — Progress Notes (Signed)
Subjective:   Norma Clay is a 72 y.o. female who presents for an Initial Medicare Annual Wellness Visit.  Visit Complete: Virtual I connected with  Marlis Mawhinney on 10/25/22 by a audio enabled telemedicine application and verified that I am speaking with the correct person using two identifiers.  Patient Location: Home  Provider Location: Home Office  I discussed the limitations of evaluation and management by telemedicine. The patient expressed understanding and agreed to proceed.  Vital Signs: Because this visit was a virtual/telehealth visit, some criteria may be missing or patient reported. Any vitals not documented were not able to be obtained and vitals that have been documented are patient reported.  Cardiac Risk Factors include: advanced age (>76men, >46 women);diabetes mellitus;hypertension;dyslipidemia     Objective:    Today's Vitals   10/25/22 0917  Weight: 169 lb (76.7 kg)  Height: 5' 4.5" (1.638 m)   Body mass index is 28.56 kg/m.     10/25/2022    9:56 AM 09/17/2020   12:36 PM 06/23/2020    6:00 PM 06/22/2020    9:36 AM 09/27/2017    9:56 AM 10/18/2016    7:05 PM 06/22/2016   10:36 AM  Advanced Directives  Does Patient Have a Medical Advance Directive? No No No No No No No  Would patient like information on creating a medical advance directive? Yes (MAU/Ambulatory/Procedural Areas - Information given) No - Patient declined No - Patient declined Yes (MAU/Ambulatory/Procedural Areas - Information given)       Current Medications (verified) Outpatient Encounter Medications as of 10/25/2022  Medication Sig   acetaminophen (TYLENOL) 325 MG tablet Take 1-2 tablets (325-650 mg total) by mouth every 6 (six) hours as needed for moderate pain.   albuterol (VENTOLIN HFA) 108 (90 Base) MCG/ACT inhaler TAKE 2 PUFFS BY MOUTH EVERY 6 HOURS AS NEEDED FOR WHEEZE OR SHORTNESS OF BREATH   atorvastatin (LIPITOR) 20 MG tablet Take 1 tablet (20 mg total) by mouth daily.    Blood Glucose Monitoring Suppl (ACCU-CHEK GUIDE ME) w/Device KIT Use to check TID.   brimonidine (ALPHAGAN) 0.2 % ophthalmic solution Place 1 drop into the right eye 2 (two) times daily.   calcitRIOL (ROCALTROL) 0.25 MCG capsule Take 2 capsules (0.5 mcg total) by mouth daily.   calcium carbonate (OS-CAL) 1250 (500 Ca) MG chewable tablet Chew 1 tablet (1,250 mg total) by mouth 3 (three) times daily.   Cholecalciferol (VITAMIN D3) 10 MCG (400 UNIT) tablet Take 1 tablet (400 Units total) by mouth daily.   clotrimazole (LOTRIMIN) 1 % cream Apply 1 Application topically 2 (two) times daily. To both feet at base of toes   Continuous Glucose Sensor (FREESTYLE LIBRE 3 SENSOR) MISC USE 1 EACH EVERY 14 (FOURTEEN) DAYS.   dorzolamide-timolol (COSOPT) 22.3-6.8 MG/ML ophthalmic solution Place 1 drop into both eyes 2 (two) times daily   empagliflozin (JARDIANCE) 25 MG TABS tablet Take 1 tablet (25 mg total) by mouth daily before breakfast.   ferrous sulfate 325 (65 FE) MG tablet Take by mouth.   fluticasone (FLONASE) 50 MCG/ACT nasal spray SPRAY 2 SPRAYS INTO EACH NOSTRIL EVERY DAY   levothyroxine (SYNTHROID) 88 MCG tablet Take 1 tablet (88 mcg total) by mouth daily.   meloxicam (MOBIC) 15 MG tablet TAKE 1 TABLET (15 MG TOTAL) BY MOUTH DAILY.   metFORMIN (GLUCOPHAGE) 850 MG tablet Take 1 tablet (850 mg total) by mouth 2 (two) times daily with a meal.   omeprazole (PRILOSEC) 40 MG capsule TAKE 1 CAPSULE (40  MG TOTAL) BY MOUTH DAILY.   Travoprost, BAK Free, (TRAVATAN) 0.004 % SOLN ophthalmic solution Place 1 drop into both eyes nightly   [DISCONTINUED] glimepiride (AMARYL) 4 MG tablet One pill once per day before a meal   No facility-administered encounter medications on file as of 10/25/2022.    Allergies (verified) Clindamycin and Penicillins   History: Past Medical History:  Diagnosis Date   Diabetes mellitus without complication (HCC)    Glaucoma    Hypertension    Osteoarthritis of right hip  05/16/2016   Posterior capsular opacification of both eyes, 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   Primary osteoarthritis of right hip 05/06/2020   Past Surgical History:  Procedure Laterality Date   ABDOMINAL HYSTERECTOMY     GLAUCOMA SURGERY     THYROIDECTOMY     TOTAL HIP ARTHROPLASTY Right 06/23/2020   Procedure: RIGHT TOTAL HIP ARTHROPLASTY ANTERIOR APPROACH;  Surgeon: Kathryne Hitch, MD;  Location: [facility] OR;  Service: Orthopedics;  Laterality: Right;   Family History  Problem Relation Age of Onset   Diabetes Mother    Colon cancer Neg Hx    Stomach cancer Neg Hx    Esophageal cancer Neg Hx    Social History   Socioeconomic History   Marital status: Widowed    Spouse name: Not on file   Number of children: Not on file   Years of education: Not on file   Highest education level: Not on file  Occupational History   Not on file  Tobacco Use   Smoking status: Never   Smokeless tobacco: Never  Vaping Use   Vaping status: Never Used  Substance and Sexual Activity   Alcohol use: No   Drug use: No   Sexual activity: Not Currently  Other Topics Concern   Not on file  Social History Narrative   Not on file   Social Determinants of Health   Financial Resource Strain: Low Risk  (10/25/2022)   Overall Financial Resource Strain (CARDIA)    Difficulty of Paying Living Expenses: Not hard at all  Food Insecurity: No Food Insecurity (10/25/2022)   Hunger Vital Sign    Worried About Running Out of Food in the Last Year: Never true    Ran Out of Food in the Last Year: Never true  Transportation Needs: No Transportation Needs (10/25/2022)   PRAPARE - Administrator, Civil Service (Medical): No    Lack of Transportation (Non-Medical): No  Physical Activity: Inactive (10/25/2022)   Exercise Vital Sign    Days of Exercise per Week: 0 days    Minutes of Exercise per Session: 0 min  Stress: No Stress Concern Present (10/25/2022)   [employer] of Occupational Health -  Occupational Stress Questionnaire    Feeling of Stress : Not at all  Social Connections: Moderately Isolated (10/25/2022)   Social Connection and Isolation Panel [NHANES]    Frequency of Communication with Friends and Family: More than three times a week    Frequency of Social Gatherings with Friends and Family: Three times a week    Attends Religious Services: 1 to 4 times per year    Active Member of Clubs or Organizations: No    Attends Banker Meetings: Never    Marital Status: Widowed    Tobacco Counseling Counseling given: Not Answered   Clinical Intake:  Pre-visit preparation completed: Yes  Pain : No/denies pain     Diabetes: Yes CBG done?: No Did pt. bring in CBG monitor from  home?: No  How often do you need to have someone help you when you read instructions, pamphlets, or other written materials from your doctor or pharmacy?: 1 - Never  Interpreter Needed?: Yes Interpreter Name: Shirline Frees Interpreter ID: [id number] Patient Declined Interpreter : No Patient signed  waiver: No  Comments: Assisted with visit by daughter Information entered by :: Kandis Fantasia LPN   Activities of Daily Living    10/25/2022    9:55 AM  In your present state of health, do you have any difficulty performing the following activities:  Hearing? 0  Vision? 0  Difficulty concentrating or making decisions? 0  Walking or climbing stairs? 1  Dressing or bathing? 0  Doing errands, shopping? 1  Preparing Food and eating ? N  Using the Toilet? N  In the past six months, have you accidently leaked urine? N  Do you have problems with loss of bowel control? N  Managing your Medications? N  Managing your Finances? N  Housekeeping or managing your Housekeeping? N    Patient Care Team: Storm Frisk, MD as PCP - General (Pulmonary Disease) Moya, Harrietta Guardian, MD as Referring Physician (Ophthalmology) Spainhour, Kermit Balo, PA (Otolaryngology) [facility], Konrad Dolores, MD as Attending Physician (Endocrinology)  Indicate any recent Medical Services you may have received from other than [facility] providers in the past year (date may be approximate).     Assessment:   This is a routine wellness examination for Layney.  Hearing/Vision screen Hearing Screening - Comments:: Denies hearing difficulties   Vision Screening - Comments:: Wears rx glasses - up to date with routine eye exams with Dr. Arville Go     Goals Addressed             This Visit's Progress    Prevent falls        Depression Screen    10/25/2022    9:54 AM 06/14/2022    3:16 PM 12/14/2020    9:45 AM 09/14/2020   10:20 AM 07/13/2020    9:50 AM 04/13/2020    4:24 PM 02/26/2020   11:21 AM  PHQ 2/9 Scores  PHQ - 2 Score 0 0 0 1 0 1 0  PHQ- 9 Score  0   2 6 0    Fall Risk    10/25/2022    9:54 AM 06/14/2022    3:16 PM 12/14/2020    9:45 AM 09/14/2020   10:20 AM 07/13/2020    9:49 AM  Fall Risk   Falls in the past year? 1 0 1 0 0  Number falls in past yr: 1 1 1  0 0  Injury with Fall? 0 0 0 0 0  Risk for fall due to : History of fall(s);Impaired balance/gait;Impaired mobility No Fall Risks Other (Comment) No Fall Risks   Follow up Education provided;Falls prevention discussed;Falls evaluation completed Falls evaluation completed       MEDICARE RISK AT HOME: Medicare Risk at Home Any stairs in or around the home?: No If so, are there any without handrails?: No Home free of loose throw rugs in walkways, pet beds, electrical cords, etc?: Yes Adequate lighting in your home to reduce risk of falls?: Yes Life alert?: No Use of a cane, walker or w/c?: Yes Grab bars in the bathroom?: Yes Shower chair or bench in shower?: No Elevated toilet seat or a handicapped toilet?: Yes  TIMED UP AND GO:  Was the test performed? No    Cognitive Function:  12/17/2019    8:07 AM  MMSE - Mini Mental State Exam  Orientation to time 1  Orientation to Place 2  Registration 1   Attention/ Calculation 0  Recall 1  Language- name 2 objects 2  Language- repeat 0  Language- follow 3 step command 3  Language- read & follow direction 0  Language-read & follow direction-comments unable to complete due to language barrier  Write a sentence 0  Copy design 0  Total score 10        10/25/2022    9:56 AM  6CIT Screen  What Year? 0 points  What month? 0 points  What time? 0 points  Count back from 20 0 points  Months in reverse 0 points  Repeat phrase 0 points  Total Score 0 points    Immunizations Immunization History  Administered Date(s) Administered   Influenza, High Dose Seasonal PF 11/01/2021   Influenza,inj,Quad PF,6+ Mos 12/16/2019, 09/14/2020   PFIZER(Purple Top)SARS-COV-2 Vaccination 02/23/2019, 03/19/2019   PNEUMOCOCCAL CONJUGATE-20 08/02/2021   Pneumococcal Conjugate-13 04/18/2016   Pneumococcal Polysaccharide-23 12/16/2019   Tdap 04/18/2016    TDAP status: Up to date  Flu Vaccine status: Due, Education has been provided regarding the importance of this vaccine. Advised may receive this vaccine at local pharmacy or Health Dept. Aware to provide a copy of the vaccination record if obtained from local pharmacy or Health Dept. Verbalized acceptance and understanding.  Pneumococcal vaccine status: Up to date  Covid-19 vaccine status: Information provided on how to obtain vaccines.   Qualifies for Shingles Vaccine? Yes   Zostavax completed No   Shingrix Completed?: No.    Education has been provided regarding the importance of this vaccine. Patient has been advised to call insurance company to determine out of pocket expense if they have not yet received this vaccine. Advised may also receive vaccine at local pharmacy or Health Dept. Verbalized acceptance and understanding.  Screening Tests Health Maintenance  Topic Date Due   COLON CANCER SCREENING ANNUAL FOBT  Never done   FOOT EXAM  12/15/2020   OPHTHALMOLOGY EXAM  02/06/2021   Diabetic  kidney evaluation - Urine ACR  09/14/2021   INFLUENZA VACCINE  08/04/2022   COVID-19 Vaccine (3 - 2023-24 season) 09/04/2022   HEMOGLOBIN A1C  12/20/2022   MAMMOGRAM  12/24/2022   Diabetic kidney evaluation - eGFR measurement  06/14/2023   Medicare Annual Wellness (AWV)  10/25/2023   DTaP/Tdap/Td (2 - Td or Tdap) 04/19/2026   Pneumonia Vaccine 51+ Years old  Completed   DEXA SCAN  Completed   Hepatitis C Screening  Completed   HPV VACCINES  Aged Out   Colonoscopy  Discontinued   Zoster Vaccines- Shingrix  Discontinued    Health Maintenance  Health Maintenance Due  Topic Date Due   COLON CANCER SCREENING ANNUAL FOBT  Never done   FOOT EXAM  12/15/2020   OPHTHALMOLOGY EXAM  02/06/2021   Diabetic kidney evaluation - Urine ACR  09/14/2021   INFLUENZA VACCINE  08/04/2022   COVID-19 Vaccine (3 - 2023-24 season) 09/04/2022    Colorectal cancer screening:  Declines at this time   Mammogram status: Ordered today. Pt provided with contact info and advised to call to schedule appt.   Bone Density status: Completed 02/14/18. Results reflect: Bone density results: OSTEOPENIA. Repeat every 2 years.  Lung Cancer Screening: (Low Dose CT Chest recommended if Age 73-80 years, 20 pack-year currently smoking OR have quit w/in 15years.) does not qualify.   Lung Cancer Screening  Referral: n/a  Additional Screening:  Hepatitis C Screening: does qualify; Completed 04/18/16  Vision Screening: Recommended annual ophthalmology exams for early detection of glaucoma and other disorders of the eye. Is the patient up to date with their annual eye exam?  Yes  Who is the provider or what is the name of the office in which the patient attends annual eye exams? Dr. Arville Go If pt is not established with a provider, would they like to be referred to a provider to establish care? No .   Dental Screening: Recommended annual dental exams for proper oral hygiene  Diabetic Foot Exam: Diabetic Foot Exam:  Overdue, Pt has been advised about the importance in completing this exam. Pt is scheduled for diabetic foot exam on at next office visit.  Community Resource Referral / Chronic Care Management: CRR required this visit?  No   CCM required this visit?  No     Plan:     I have personally reviewed and noted the following in the patient's chart:   Medical and social history Use of alcohol, tobacco or illicit drugs  Current medications and supplements including opioid prescriptions. Patient is not currently taking opioid prescriptions. Functional ability and status Nutritional status Physical activity Advanced directives List of other physicians Hospitalizations, surgeries, and ER visits in previous 12 months Vitals Screenings to include cognitive, depression, and falls Referrals and appointments  In addition, I have reviewed and discussed with patient certain preventive protocols, quality metrics, and best practice recommendations. A written personalized care plan for preventive services as well as general preventive health recommendations were provided to patient.     Kandis Fantasia [place], [place]   [id number]   After Visit Summary: (MyChart) Due to this being a telephonic visit, the after visit summary with patients personalized plan was offered to patient via MyChart   Nurse Notes: No concerns at this time

## 2022-10-25 NOTE — Addendum Note (Signed)
Addended by: Kandis Fantasia B on: 10/25/2022 10:25 AM   Modules accepted: Orders

## 2022-10-25 NOTE — Patient Instructions (Signed)
Norma Clay , Thank you for taking time to come for your Medicare Wellness Visit. I appreciate your ongoing commitment to your health goals. Please review the following plan we discussed and let me know if I can assist you in the future.   Referrals/Orders/Follow-Ups/Clinician Recommendations: Aim for 30 minutes of exercise or brisk walking, 6-8 glasses of water, and 5 servings of fruits and vegetables each day.  You have an order for:  []   2D Mammogram  [x]   3D Mammogram  []   Bone Density     Please call for appointment:   [facility] [address] [place], [place] [address] [phone]  Make sure to wear two-piece clothing.  No lotions, powders, or deodorants the day of the appointment. Make sure to bring picture ID and insurance card.  Bring list of medications you are currently taking including any supplements.   Schedule your [place] screening mammogram through MyChart!   Log into your MyChart account.  Go to 'Visit' (or 'Appointments' if on mobile App) --> Schedule an Appointment  Under 'Select a Reason for Visit' choose the Mammogram Screening option.  Complete the pre-visit questions and select the time and place that best fits your schedule.    This is a list of the screening recommended for you and due dates:  Health Maintenance  Topic Date Due   Stool Blood Test  Never done   Complete foot exam   12/15/2020   Eye exam for diabetics  02/06/2021   Yearly kidney health urinalysis for diabetes  09/14/2021   Flu Shot  08/04/2022   COVID-19 Vaccine (3 - 2023-24 season) 09/04/2022   Hemoglobin A1C  12/20/2022   Mammogram  12/24/2022   Yearly kidney function blood test for diabetes  06/14/2023   Medicare Annual Wellness Visit  10/25/2023   DTaP/Tdap/Td vaccine (2 - Td or Tdap) 04/19/2026   Pneumonia Vaccine  Completed   DEXA scan (bone density measurement)  Completed   Hepatitis C Screening  Completed   HPV Vaccine  Aged Out   Colon  Cancer Screening  Discontinued   Zoster (Shingles) Vaccine  Discontinued    Advanced directives: (ACP Link)Information on Advanced Care Planning can be found at [facility] of [place] Advance Health Care Directives Advance Health Care Directives ([email or web address]   Next Medicare Annual Wellness Visit scheduled for next year: No

## 2022-11-05 ENCOUNTER — Other Ambulatory Visit: Payer: Self-pay | Admitting: Nurse Practitioner

## 2022-11-05 DIAGNOSIS — J4520 Mild intermittent asthma, uncomplicated: Secondary | ICD-10-CM

## 2022-11-07 NOTE — Telephone Encounter (Signed)
Requested medication (s) are due for refill today - yes  Requested medication (s) are on the active medication list -yes  Future visit scheduled -yes  Last refill: 09/30/22 #30  Notes to clinic: last RF- courtesy-has notes- patient has appointment scheduled 11/18/22- requesting RF  Requested Prescriptions  Pending Prescriptions Disp Refills   omeprazole (PRILOSEC) 40 MG capsule [Pharmacy Med Name: OMEPRAZOLE DR 40 MG CAPSULE] 30 capsule 0    Sig: TAKE 1 CAPSULE (40 MG TOTAL) BY MOUTH DAILY.     Gastroenterology: Proton Pump Inhibitors Passed - 11/05/2022  8:41 AM      Passed - Valid encounter within last 12 months    Recent Outpatient Visits           4 months ago Chronic cough   [place] [facility] [place], [place] W, NP   1 year ago Type 2 diabetes mellitus without complication, without long-term current use of insulin [facility])   [place] [facility] Storm Frisk, MD   2 years ago Type 2 diabetes mellitus without complication, without long-term current use of insulin [facility])   [place] [facility] Storm Frisk, MD   2 years ago Essential hypertension   [place] [facility] Storm Frisk, MD   2 years ago Type 2 diabetes mellitus without complication, without long-term current use of insulin [place] [facility])   [place] [facility] Drucilla Chalet, RPH-CPP       Future Appointments             In 1 week Marcine Matar, MD [facility]               Requested Prescriptions  Pending Prescriptions Disp Refills   omeprazole (PRILOSEC) 40 MG capsule [Pharmacy Med Name: OMEPRAZOLE DR 40 MG CAPSULE] 30 capsule 0    Sig: TAKE 1 CAPSULE (40 MG TOTAL) BY MOUTH DAILY.     Gastroenterology: Proton Pump Inhibitors Passed - 11/05/2022  8:41 AM      Passed - Valid encounter within last 12 months    Recent  Outpatient Visits           4 months ago Chronic cough   [place] [facility] [place], [place] W, NP   1 year ago Type 2 diabetes mellitus without complication, without long-term current use of insulin [facility])   [place] [facility] Storm Frisk, MD   2 years ago Type 2 diabetes mellitus without complication, without long-term current use of insulin [facility])   [place] [facility] Storm Frisk, MD   2 years ago Essential hypertension   [place] [facility] Storm Frisk, MD   2 years ago Type 2 diabetes mellitus without complication, without long-term current use of insulin [facility])    [facility] Drucilla Chalet, RPH-CPP       Future Appointments             In 1 week Marcine Matar, MD [facility]

## 2022-11-13 ENCOUNTER — Other Ambulatory Visit: Payer: Self-pay | Admitting: Family Medicine

## 2022-11-13 DIAGNOSIS — J4520 Mild intermittent asthma, uncomplicated: Secondary | ICD-10-CM

## 2022-11-15 MED ORDER — OMEPRAZOLE 40 MG PO CPDR: 40.0000 mg | DELAYED_RELEASE_CAPSULE | Freq: Every day | ORAL | 0 refills | Status: AC

## 2022-11-15 NOTE — Telephone Encounter (Signed)
Requested Prescriptions  Pending Prescriptions Disp Refills   omeprazole (PRILOSEC) 40 MG capsule [Pharmacy Med Name: OMEPRAZOLE DR 40 MG CAPSULE] 90 capsule 0    Sig: TAKE 1 CAPSULE (40 MG TOTAL) BY MOUTH DAILY.     Gastroenterology: Proton Pump Inhibitors Passed - 11/13/2022  6:48 PM      Passed - Valid encounter within last 12 months    Recent Outpatient Visits           5 months ago Chronic cough   [place] [facility] [place] [place], [place] W, NP   1 year ago Type 2 diabetes mellitus without complication, without long-term current use of insulin (HCC)   [place] [facility] [place] Storm Frisk, MD   2 years ago Type 2 diabetes mellitus without complication, without long-term current use of insulin (HCC)   [place] [facility] [place] Storm Frisk, MD   2 years ago Essential hypertension   [place] [facility] [place] [facility] . [facility] Storm Frisk, MD   2 years ago Type 2 diabetes mellitus without complication, without long-term current use of insulin (HCC)   [place] [facility] Of . [facility] Drucilla Chalet, RPH-CPP       Future Appointments             In 3 days Marcine Matar, MD [facility] [place] - [facility] Of [facility]ideg[facility]. [facility]

## 2022-11-15 NOTE — Addendum Note (Signed)
Addended by: Ross Ludwig on: 11/15/2022 11:37 AM   Modules accepted: Orders

## 2022-11-18 ENCOUNTER — Encounter: Payer: Self-pay | Admitting: Internal Medicine

## 2022-11-18 ENCOUNTER — Ambulatory Visit: Payer: Medicare Other | Attending: Internal Medicine | Admitting: Internal Medicine

## 2022-11-18 VITALS — BP 112/72 | HR 75 | Temp 98.3°F | Ht 64.0 in | Wt 166.0 lb

## 2022-11-18 DIAGNOSIS — E785 Hyperlipidemia, unspecified: Secondary | ICD-10-CM

## 2022-11-18 DIAGNOSIS — Z28310 Unvaccinated for covid-19: Secondary | ICD-10-CM | POA: Diagnosis not present

## 2022-11-18 DIAGNOSIS — J029 Acute pharyngitis, unspecified: Secondary | ICD-10-CM

## 2022-11-18 DIAGNOSIS — R300 Dysuria: Secondary | ICD-10-CM

## 2022-11-18 DIAGNOSIS — E890 Postprocedural hypothyroidism: Secondary | ICD-10-CM | POA: Insufficient documentation

## 2022-11-18 DIAGNOSIS — Z2821 Immunization not carried out because of patient refusal: Secondary | ICD-10-CM

## 2022-11-18 DIAGNOSIS — R058 Other specified cough: Secondary | ICD-10-CM | POA: Diagnosis not present

## 2022-11-18 DIAGNOSIS — R195 Other fecal abnormalities: Secondary | ICD-10-CM | POA: Insufficient documentation

## 2022-11-18 DIAGNOSIS — Z1211 Encounter for screening for malignant neoplasm of colon: Secondary | ICD-10-CM | POA: Diagnosis not present

## 2022-11-18 DIAGNOSIS — Z0000 Encounter for general adult medical examination without abnormal findings: Secondary | ICD-10-CM

## 2022-11-18 DIAGNOSIS — H409 Unspecified glaucoma: Secondary | ICD-10-CM | POA: Insufficient documentation

## 2022-11-18 DIAGNOSIS — Z7984 Long term (current) use of oral hypoglycemic drugs: Secondary | ICD-10-CM | POA: Diagnosis not present

## 2022-11-18 DIAGNOSIS — N1831 Chronic kidney disease, stage 3a: Secondary | ICD-10-CM | POA: Diagnosis not present

## 2022-11-18 DIAGNOSIS — Z0001 Encounter for general adult medical examination with abnormal findings: Secondary | ICD-10-CM | POA: Diagnosis not present

## 2022-11-18 DIAGNOSIS — E782 Mixed hyperlipidemia: Secondary | ICD-10-CM | POA: Insufficient documentation

## 2022-11-18 DIAGNOSIS — Z23 Encounter for immunization: Secondary | ICD-10-CM

## 2022-11-18 DIAGNOSIS — I129 Hypertensive chronic kidney disease with stage 1 through stage 4 chronic kidney disease, or unspecified chronic kidney disease: Secondary | ICD-10-CM | POA: Diagnosis not present

## 2022-11-18 DIAGNOSIS — Z79899 Other long term (current) drug therapy: Secondary | ICD-10-CM | POA: Diagnosis not present

## 2022-11-18 DIAGNOSIS — E1169 Type 2 diabetes mellitus with other specified complication: Secondary | ICD-10-CM

## 2022-11-18 DIAGNOSIS — E1122 Type 2 diabetes mellitus with diabetic chronic kidney disease: Secondary | ICD-10-CM | POA: Insufficient documentation

## 2022-11-18 DIAGNOSIS — E1151 Type 2 diabetes mellitus with diabetic peripheral angiopathy without gangrene: Secondary | ICD-10-CM | POA: Diagnosis present

## 2022-11-18 DIAGNOSIS — E119 Type 2 diabetes mellitus without complications: Secondary | ICD-10-CM

## 2022-11-18 LAB — POCT GLYCOSYLATED HEMOGLOBIN (HGB A1C): HbA1c, POC (controlled diabetic range): 7.9 % — AB (ref 0.0–7.0)

## 2022-11-18 LAB — GLUCOSE, POCT (MANUAL RESULT ENTRY): POC Glucose: 220 mg/dL — AB (ref 70–99)

## 2022-11-18 IMAGING — RF DG HIP (WITH PELVIS) OPERATIVE*R*
1 series · 3 of 3 positions shown · non-contrast
Comparison: Radiograph 04/02/2020

CLINICAL DATA: Right hip arthroplasty.

EXAM:
OPERATIVE RIGHT HIP (WITH PELVIS IF PERFORMED)
TECHNIQUE: Fluoroscopic spot image(s) were submitted for interpretation
post-operatively.

[Series 1: unknown protocol · 0.20mm/px · 3 of 3 slices shown]
[im 1/3]
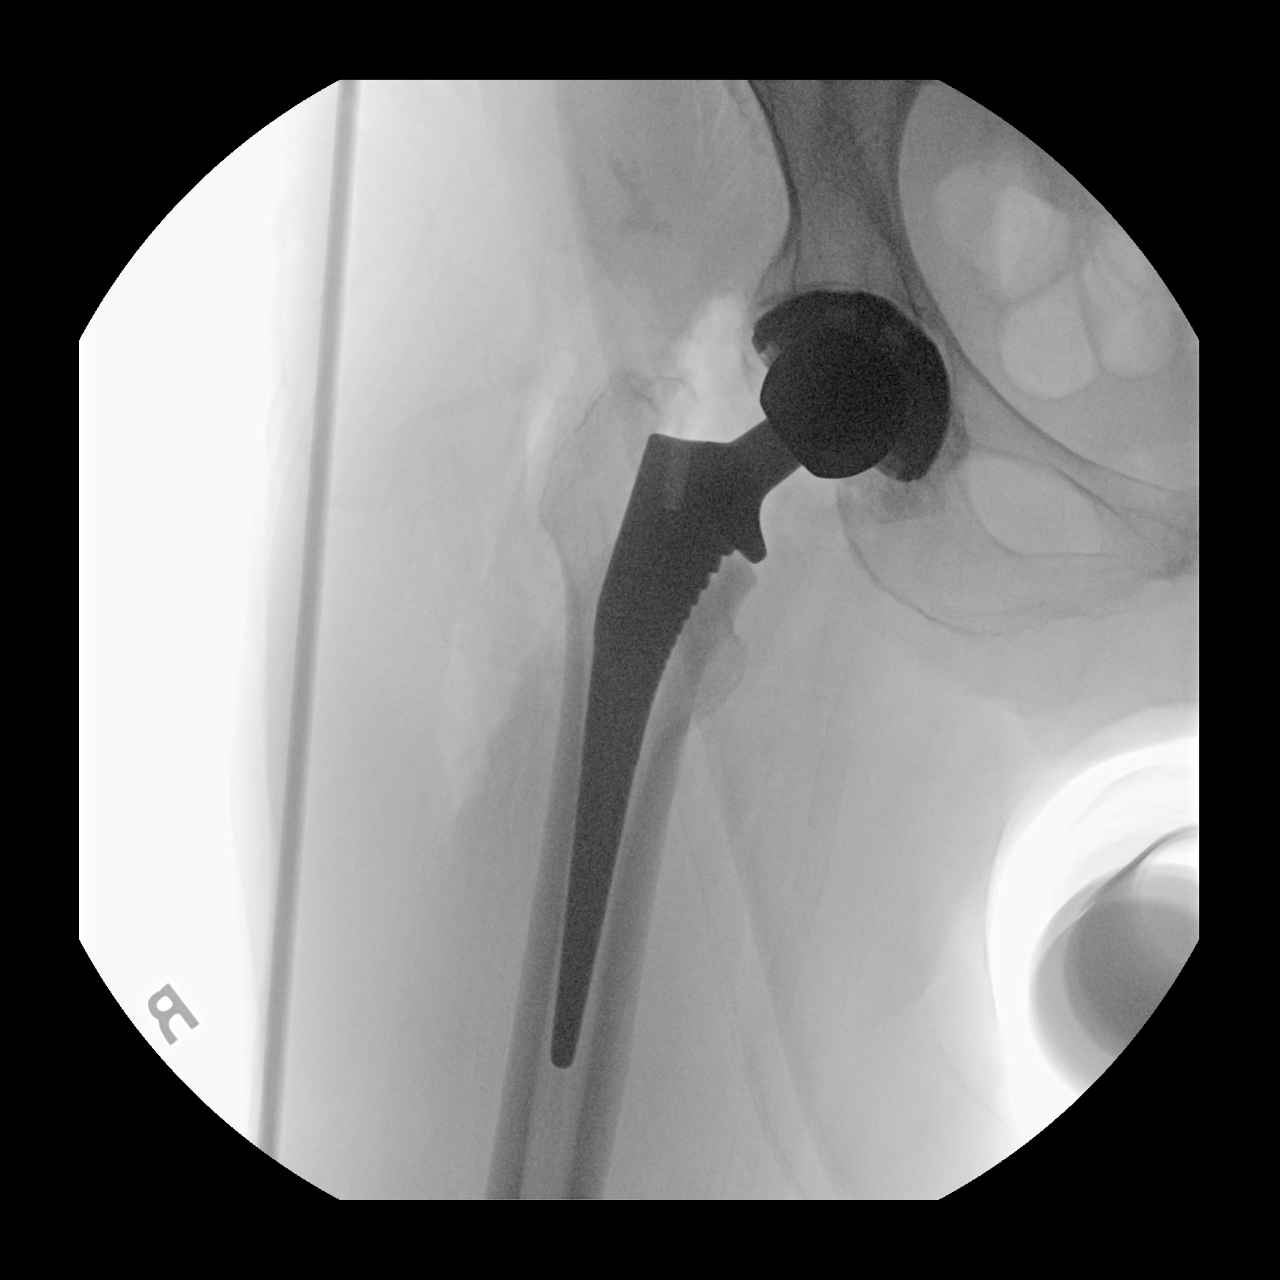
[im 2/3]
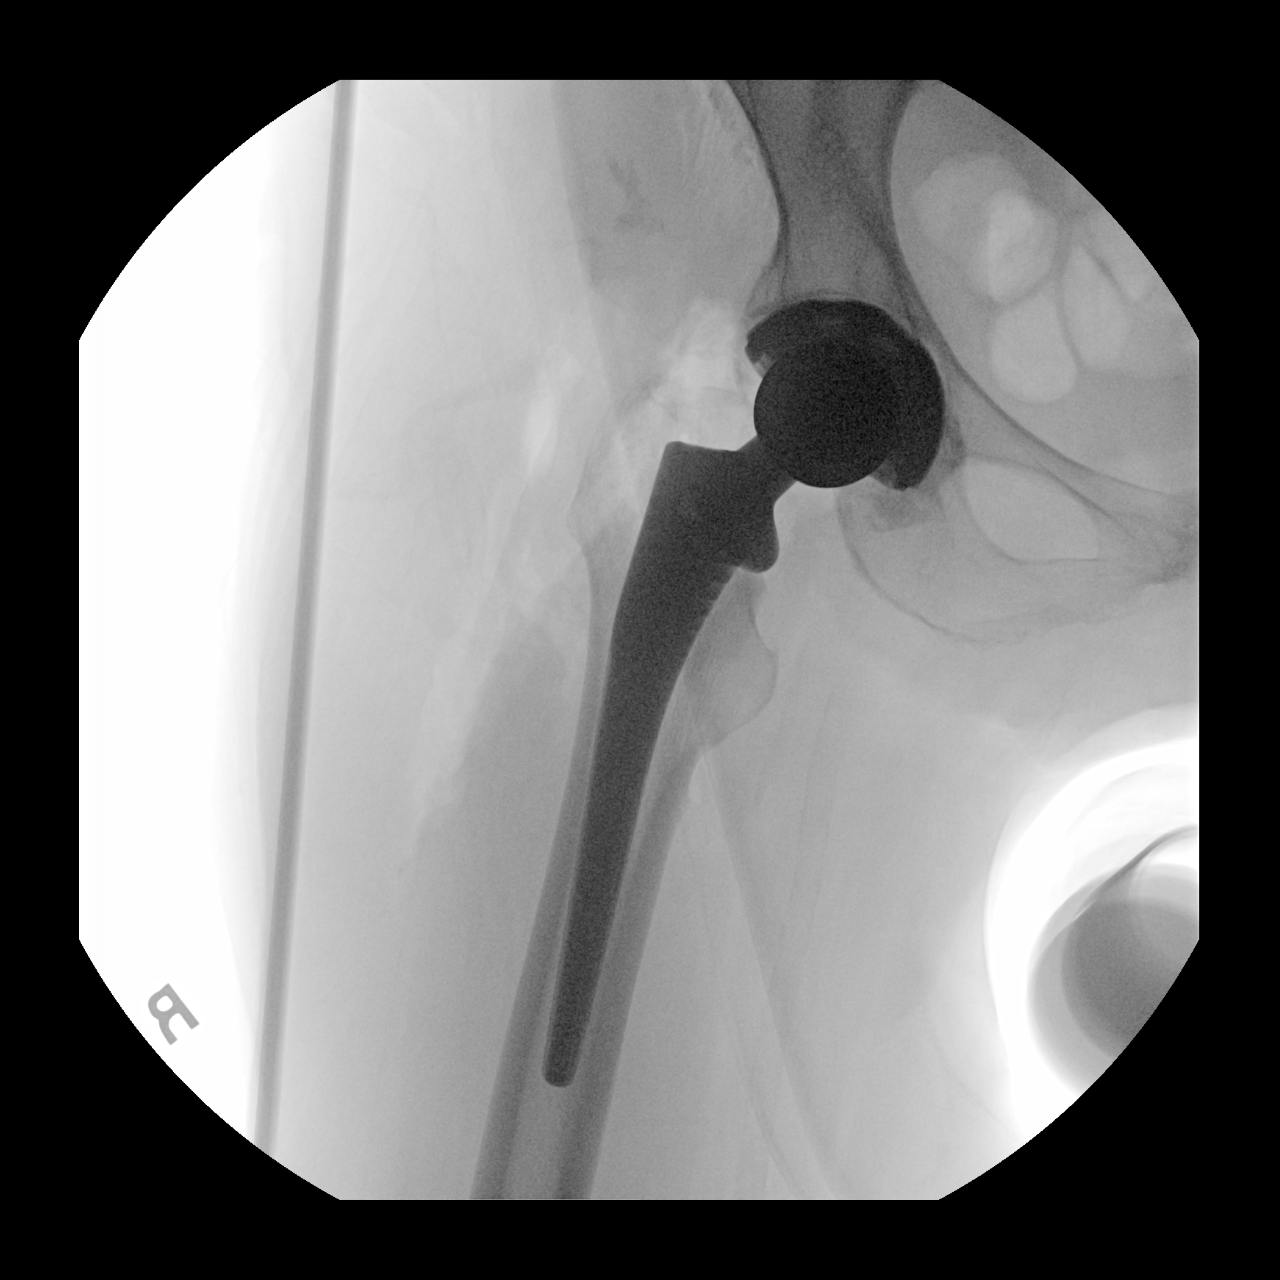
[im 3/3]
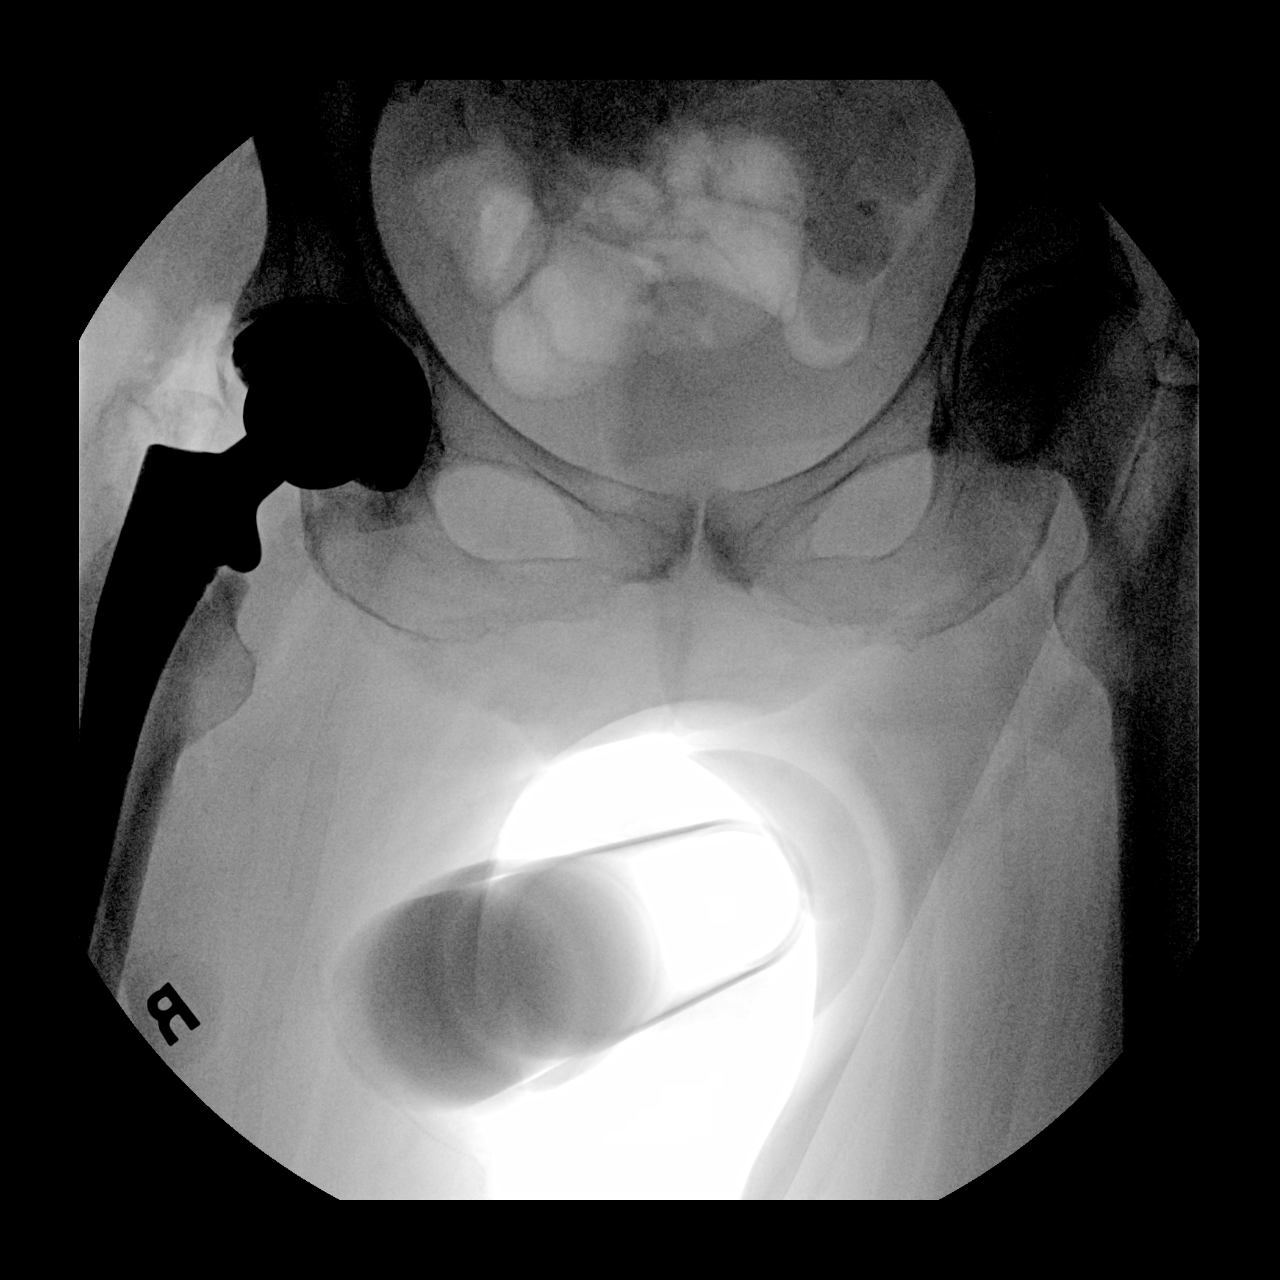

[3 of 3 positions shown; findings below may reference images not displayed]

FINDINGS: Three fluoroscopic spot views of the pelvis and right hip were
submitted in frontal projection. Placement of right hip
arthroplasty. Fluoroscopy time 20 seconds. Dose 1.62 mGy.
IMPRESSION: Procedural fluoroscopy for right hip arthroplasty.

## 2022-11-18 IMAGING — DX DG PORTABLE PELVIS
1 series · 1 of 1 positions shown · non-contrast
Comparison: None.

CLINICAL DATA: Post right hip replacement.

EXAM:
PORTABLE PELVIS 1-2 VIEWS

[pelvis]
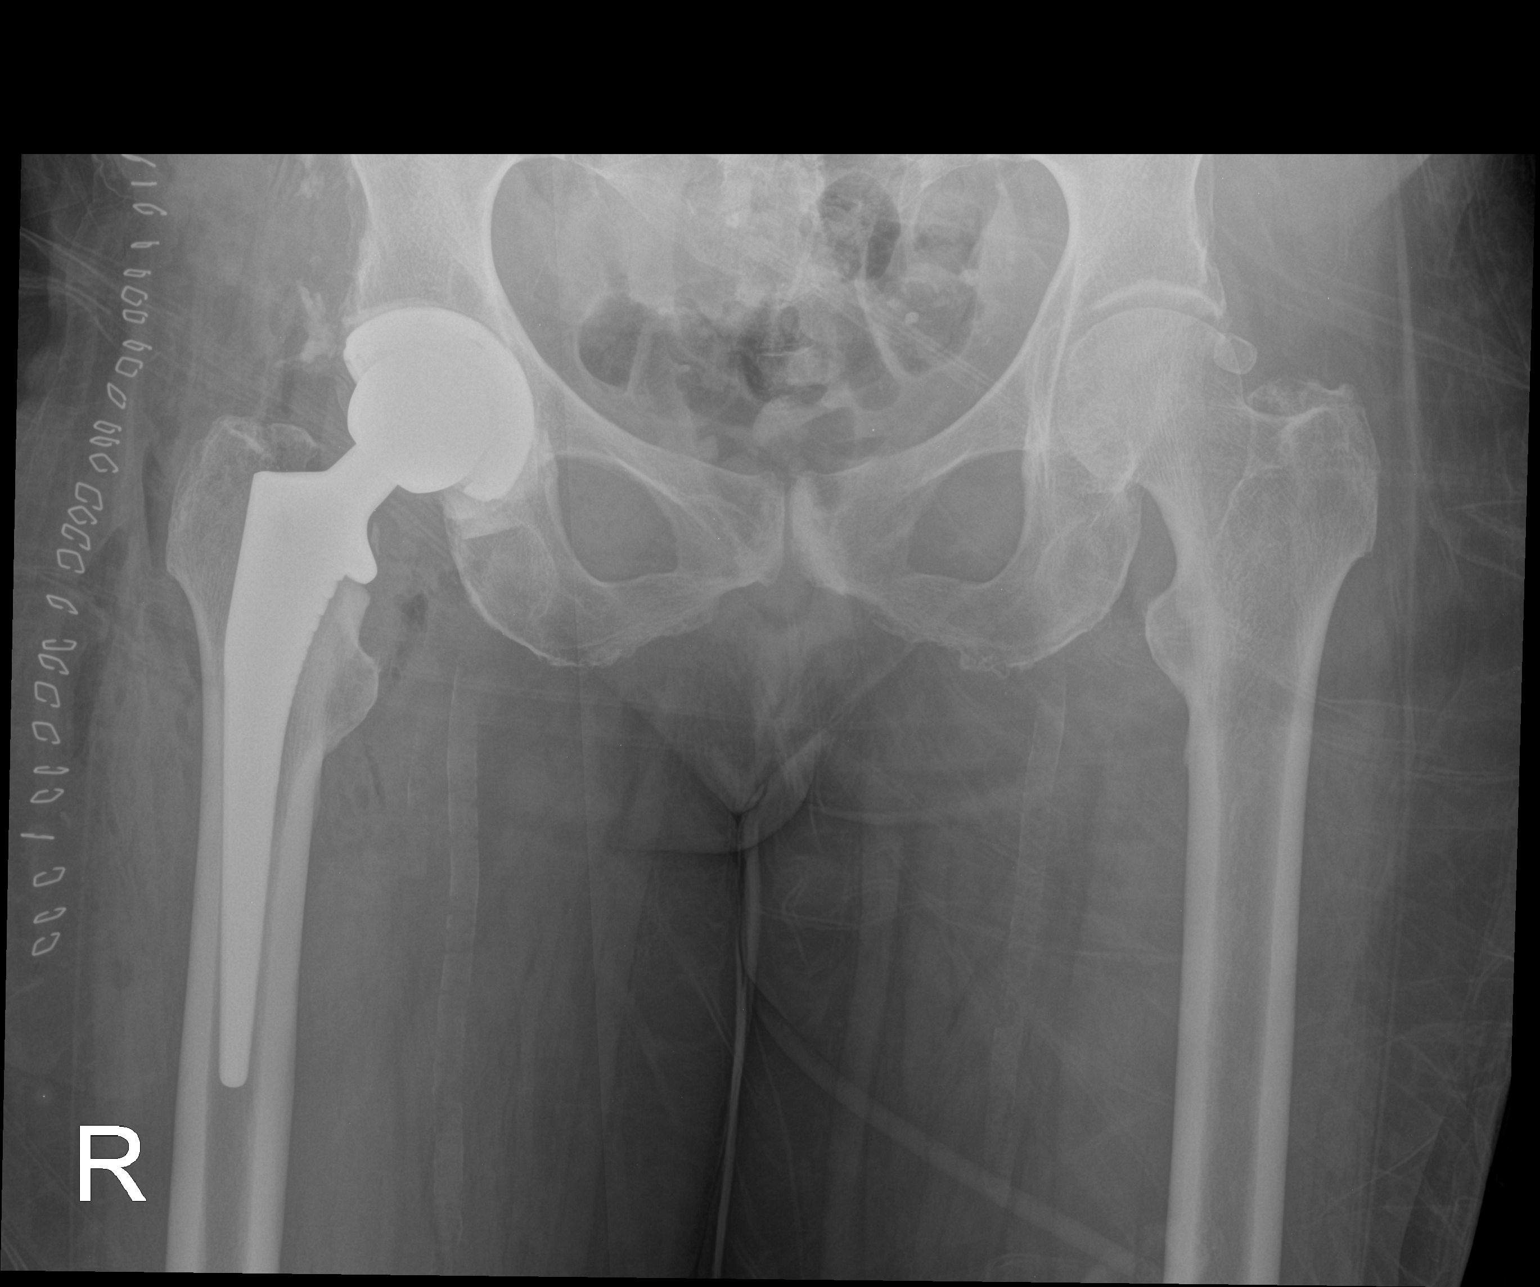

[1 of 1 positions shown; findings below may reference images not displayed]

FINDINGS: Right hip arthroplasty in expected alignment. No periprosthetic
lucency or fracture. Recent postsurgical change includes air and
edema in the joint space and soft tissues with lateral skin staples.
IMPRESSION: Right hip arthroplasty without immediate postoperative complication.

## 2022-11-18 MED ORDER — GLIMEPIRIDE 1 MG PO TABS: 1.0000 mg | ORAL_TABLET | Freq: Every day | ORAL | 1 refills | Status: DC

## 2022-11-18 NOTE — Patient Instructions (Signed)
Your diabetes is not at goal.  We have added a medication called glimepiride 1 mg daily.  ??? ????? ???? ??? ????? Heart-Healthy Eating Plan ??? ????? ????? ????? ????? ??? ???????? ???????? ????? ?????? ?????? ????? ????? ????? ?????? ??????? ???????? ????????. ?????? ??? ????? ????? ?????? ???????. ?????? ??? ??? ????? ??? ?????? ??????? ?????? ?? ???? ?? ???? ???????. ???? ????? ?????? ?????? ????? ???? ???? ?? ?????? ??? ??????? ?????? ?????? ??????? ?????? ????? (????????) ?? ??????? ??????????? ?????? ??????? ???? ????? ??????? ?????? ???????. ?? ?? ????? ?? ????? ????? ??????? ?????? ??????? ?? ??? ???:  ????? ?? ??????? ?? ?????? ????? _________% ?? ??? ?? ?????? ??????? ???????? ???? ???????? ??????.  ????? ?? ??????? ?? ?????? ??????? ????? _________% ?? ??? ?? ?????? ??????? ???????? ???? ???????? ??????.  ????? ???? ??????????? ???? ???? ????? ?? ??????? ?????????? ???? ?? _______ ??? ?? ?????.  ????? ???? ???????? ???? ???? ????? ?? ??????? ?????????? ???? ?? _______ ??? ?? ?????. ?? ??????? ???? ?????? ??? ????? ??? ?????? ????? ???? ??? ?????? ???????? ????? ???? ??? ?????. ????? ?????? ????? ?????? ?? ???????? ??????. ??? ????? ?????? ?????? ??????:  ????? ????? ?? ???????.  ????? ???? ?????? ??????? ?? ??????.  ??? ?????? ?? ??? ?? ???. ????? ???????   ?? ????? ??????? ???? ????? ????? ??? ???? ???: ? ???? ??? ???? ?????????? ??????? ???????. ? ???? ??? ???? ??????? ???????. ? ???? ??? ???? ???????? ??? ?????????? ??????? ?? ??????.  ????? ????? ???? ??? 4 ????? ?? ????????? ??????. ?????? ?????? ?? ???????? ????? ????? ?????? (91 ??) ?? ???????? ?? ???? ????????? ?? ?????? ??????? ?????? ?? ???? ???? ??? ?????? ?? ???? ????? ???? ?????? ?? ???? ????? ?????? ?? ???? ????? ????? ??????? ?? ????? (150 ??) ?? ???????? ??????? ??????.  ????? ?? ??? ???? ??? ????? ???? ?? ??????? ??????. ?????? ?????? ?? ??????? ????? ????? ?????? ?? ???? ?????? ?? ??? ???? (237 ??) ?? ???????  ???????? ?? ??????? ?????? ?? ??? ??? (82 ??) ?? ??????? ???????? ?? ??? ???? (240 ??) ?? ???? ??????? ??????.  ????? ?????? ?? ??????? ???????? ??? ????? ????? ???????. ??? ??? ??????? ?????? ????????? ?????? ????????? ???????. ???? ???? ?????? ??? 25-30 ?? ?? ??????? ??????.  ??? ???????? ?? ????????? ????????? ??????? ??? 4-5 ??? ?? ???????. ?????? ????? ??????? ?? ????????? ??????? ?? ????????? ??? ??? (90 ??) ?? ??? ??????? ?????? ?????? ??????? ?? ???????? ????? ??? ????? (12 ??? ???? ?? 24 ??? ????? ?? 7 ????? ?? ?????)? ?????? ??????? ?? ?????? ????? ??? ????? (8 ??). ??????  ???? ?? ?????? ?????? ??????. ???? ?????? ???????? ????????? ??? ???????? ??? ??? ??????? ???? ???????? ???? ????????? ????? ?????? ?????? ?????? ???????.  ????? ?????? ?? ???? ??????-3. ??????? ?????? ????? ???????? ????????? ???????? ??????? ???? ??? ?????? ???? ?????? ???????. ???? ????? ????? ????? ????? ??? ????? ????????.  ???? ?????? ??????? ???????? ?????? ?????? ??????? ???? ????? ??? ???? ?????? ?? ????? ????? ?? ?????? ???????.  ??? ?? ?????? ?????? ???????. ???? ?????? ??????? ?? ???????? ?????????? ??? ?????? ?????? ???????. ????? ??????? ???????? ?????? ??????? ??? ?????? ???? ?? ?????? ???? ??? ?????.  ???? ??????? ???????? ??? ???? ?????? ??????. ??? ???? ????? ??? ???? ??????. ??? ??????? ??? ??? ???????: ??? ?????????? ???? ??????? ?? ???????? ????????? ?????????? ?????? ?? ?????????.  ???? ??????? ???????. ??????? ????  ???? ?? ????? ??????? ???????? ???? ?? ????? ????? ??????? ???????? ???????? ???????.  ??? ?? ????? ????????? ?? ???? ???? ??????.  ??? ?? ??????? ?????? ????? ?????? ?? ????? ????????? ??????? ??? ??????? ???????? ???????? ?????? ?????? ?????? (????? ?????? ????????? ?????????).  ???? ????? ??? ??? ??????. ???? ???? ?????  5-10% ??? ???? ?? ????? ?? ????? ???? ?????? ??????? ?? ?????? ?? ??????? ??? ?????? ?????? ?????.  ????? ?????? ???????? ?? ????? ??????? ????? ??? ??? ????? ??  ?????? ??? ????. ????? ??????? ??????? ?????? ??????? ?? ???? ?? ??????? ?? ????????.  ???? ????? ?? ??????? ?? ????? ?????? ?? ?? ?????. ?? ??????? ???? ??? ?? ????????? ??????? ???? ????? ??????? ??????? ?? ??????? (?? ???? ?????) ?? ???????. ????????? ???????? ??????? ?? ??????? (?????? ?? ??????? ?? ??????? ?? ???????). ???????? ???????. ?????? ???? ??????. ?? ?????? ????? ?????? ??????? ??????? ?? ???? ?????. ????? ??????????? ??? ?? ??? ????? ????? ?????????? ???????? ?? ????? ??????. ?????? ??????????? ?????? ????? ?????? ???? ????? ???? ??????? ??????? ????? ??????. ???? ?????? ?????? ?????? ?????? ?????. ???? ????? ????? ???????. ???? ????? ???????? ????? ?????? ???? ??????. ???? ????? ?? ????? ????? ?????? ???????????. ?????? ??????? ????????? ?????? ???????. ?????? ????? ????????. ?????? ??????? ?????? ????? ????? ?? ?????? ?????? ??? ?? ??? ??? ???????? ???????????. ?????? ??????? ?? ???? ????? 1% (???? ??? ?????? ?? ??????? ?? ?????? ??? ????? ???). ??? ???? ????? ?? ?????? ???? ?????. ????? ?????? ???? ????? ?? ???? ?????. ?????? ??????? ????????? (????? ???????) ??? ??????? (???? ?? ?????? ????????). ?????? ????????? ????? ??? ?????? ??????? ????? ????? ???????? ?????????? ?????? ???????? ??????? ?????? ????????? ????? ?????. ??? ??????? ?? ????????? ????? ???? ?????. ????????? ?????? (???????? ?? ???????). ?????? ??????. ????? ?????? ??? ??????. ??????? ?????? ????????. ???????? ??????? ???? ?????? ?????????? ??????? ??????? ???????. ????? ????? ?? ?????????? ???????. ??? ?? ??????? ?? ???????? ????. ??????? ????????? ???? ??????? ?????????. ???? ?? ???? ??????? ??????? ????? ??????? ??????? ?? ????????? ?????????? ???? ????? ???????. ????? ??????? ????? ?????? ?????? ?? ????????. ?? ??????? ?????? ??????? ??????? ??????? ??????? ?? ???? ????. ??????? ????? ?????? ?? ???????. ??????? ???????. ??? ?? ??? ?????. ????????? ????????? ???????? ?? ????? ?? ??????? ?? ???? ??????.  ????????? ???????. ?????? ????? ????? ???????? ?????? ??????? ?? ????????? ?? ?????? ?? ?????? ??????. ?????????. ??????? ??????. ???????. ????????? ????? ????? ??? ????? ?????? ????? ?????? ???????. ?????? ??????????? ?????? ?????? ??????? ??? ????? ???? ????????? ??????? ???? ??????? ??????? ???? ????? ?????? ?? ????? ?????? ????. ?????? ??????? ????? ?????? ??? ??????? ??????????. ????????. ???? ?????? ????????. ????? ????????? ??? ?????. ?????? ??????? ?????? ??????? ??????? ??????? ??????? ? ??? ???? ???????. ??????? ???????? ?? ???? ???? ?????. ???? ???? ????? ?? ???? 2% (?????? ?? ???????? ?? ????? ??? ?? ?????). ??? ?????? ???? ?????. ????? ??????? ?? ??? ????? ???? ?????. ??????? ??????? ?? ????? ???? ?????. ?????? ??????? ??? ????? ?? ????????? ??????????? (????????). ???? ??????? ??????? ???????? ???? ?????? ? ??? ??? ????? ???? ???? ??????. ?????? ?????? ??????? ??? ?? ??? ???? ??? ??????? ?????? ???? ??????? ?????? ???? ??????? ???????. ????? ?????? ??? ???????? ?? ?????. ??? ?????? ??????? ?? ????? ?? ??????? ???????. ????????? ??????? ?????? ??????? ??? ??????? ????? ??? ?????? ?????. ???????? ??????? ?????? ???????. ???????. ????????. ?????. ???????. ?????????? ??????? ?? ?????????? ???????. ?????? ??????? ??????. ??? ???? ???? ????? ?? ??????? ????? ????. ?? ?? ???? ??????? ??????? ????? ??????? ??????? ?? ????????? ?????????? ???? ????? ???? ??????. ??? ????? ??????? ??????? ?????? ?????? ?? ?????????. ????  ??? ????? ?????? ?????? ????? ???? ???? ?? ?????? ??? ??????? ?????? ?????? ??????? ?????? ????? (????????) ?? ??????? ??????????? ?????? ??????? ???? ????? ??????? ?????? ???????.  ???? ????? ??? ??? ??????. ???? ???? ????? 5-10% ??? ???? ?? ????? ?? ????? ???? ?????? ??????? ?? ?????? ?? ??????? ??? ?????? ?????? ?????.  ??? ??? ????? ???? ??????? ??? ????????? ????????? ??????? ??????? ????? ????? ?? ?????? ?????? ??????????? ????? ?????? ????????? ??????????? ???????  ???????? ??????? ??????? ?????? ?????. ??? ????? ?? ??? ????????? ?? ???? ?????? ????????? ???? ?????? ???? ??????? ??????. ???? ?? ?????? ??? ????? ???? ?? ???? ?? ???? ??????? ??????.?   Document Revised: 02/12/2021 Document Reviewed: 02/12/2021 Elsevier Patient Education  2024 [employer].

## 2022-11-18 NOTE — Progress Notes (Unsigned)
Patient ID: Norma Clay, female    DOB: 01/10/1950  MRN: [id number]  CC: Annual Exam (Physical./Soret throat, cough x 3 days Valentino Hue to flu vax)   Subjective: Norma Clay is a 72 y.o. female who presents for annual exam.  Previous PCP was Dr. Delford Field who has retired.  Daughter, Norma Clay, is with her.  Pt offered but declined our video interpreter.  Wavier signed.  Daughter interprets. Her concerns today include:  Patient with history of DM type II, HTN, PAD, post surgical hypothyroidism, glaucoma, chorioretinal inflammation bilaterally  C/o sore throat x 3 days; no problems swallowing.   Has dry cough x few mths but getting better; no SOB or fever.  No one sick at home  DM: Results for orders placed or performed in visit on 11/18/22  Microscopic Examination  Result Value Ref Range   WBC, UA 11-30 (A) 0 - 5 /hpf   RBC, Urine 0-2 0 - 2 /hpf   Epithelial Cells (non renal) 0-10 0 - 10 /hpf   Casts None seen None seen /lpf   Bacteria, UA None seen None seen/Few  Microalbumin / creatinine urine ratio  Result Value Ref Range   Creatinine, Urine WILL FOLLOW    Microalbumin, Urine WILL FOLLOW    Microalb/Creat Ratio WILL FOLLOW   Urinalysis, Routine w reflex microscopic  Result Value Ref Range   Specific Gravity, UA 1.023 1.005 - 1.030   pH, UA 6.5 5.0 - 7.5   Color, UA Yellow Yellow   Appearance Ur Clear Clear   Leukocytes,UA 2+ (A) Negative   Protein,UA Negative Negative/Trace   Glucose, UA 3+ (A) Negative   Ketones, UA Negative Negative   RBC, UA Negative Negative   Bilirubin, UA Negative Negative   Urobilinogen, Ur 0.2 0.2 - 1.0 mg/dL   Nitrite, UA Negative Negative   Microscopic Examination See below:   POCT glycosylated hemoglobin (Hb A1C)  Result Value Ref Range   Hemoglobin A1C     HbA1c POC (<> result, manual entry)     HbA1c, POC (prediabetic range)     HbA1c, POC (controlled diabetic range) 7.9 (A) 0.0 - 7.0 %  POCT glucose (manual entry)  Result Value Ref Range    POC Glucose 220 (A) 70 - 99 mg/dl  On Metformin 010 mg BID and Jardiance 25 mg daily; compliant with taking meds.  Reports increase appetite with increase dose Jardiance No sugary drink, eats wheat bread, not much meat.   Has CGM.  TIR for 7 days, 63% of time; above TR 37%; 0% low.  Past 1 mth TIR 68%, above range 30% and below 2% GFR range has been 53-59  HTN: monitored off med.  Practices low-salt diet.  HL:  taking and tolerating Lipitor, last LDL 77  Eye: seeing ophthalmologist [facility].  Last seen 10/25/2022.  Has diagnosis of POAG and chorioretinal inflammation.  Started on AZ 25 mg daily and Valtrex 1 g daily.   Patient Active Problem List   Diagnosis Date Noted   Mixed hyperlipidemia 08/04/2021   Gastroesophageal reflux disease 07/13/2021   Overweight (BMI 25.0-29.9) 07/13/2021   PAD (peripheral artery disease) (HCC) 12/14/2020   Pelvic pain 12/14/2020   Lower abdominal pain 12/14/2020   Uveitis of right eye 09/21/2020   Status post total replacement of right hip 06/23/2020   Diabetic neuropathy (HCC) 04/13/2020   Acquired hypothyroidism 12/16/2019   Hyperlipidemia associated with type 2 diabetes mellitus (HCC) 12/16/2019   Hammer toes of both feet 12/16/2019  Degenerative arthritis of lumbar spine 12/16/2019   Dysuria 11/22/2019   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   Essential hypertension 02/19/2019   Postsurgical states following surgery of eye and adnexa 10/01/2018   Pseudophakia of both eyes 04/23/2018   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 30 tablet 0   albuterol (VENTOLIN HFA) 108 (90 Base) MCG/ACT inhaler TAKE 2 PUFFS BY MOUTH EVERY 6 HOURS AS NEEDED FOR WHEEZE OR SHORTNESS OF BREATH 54 each 0   atorvastatin (LIPITOR) 20 MG tablet Take 1 tablet (20 mg total) by mouth daily. 90 tablet 2   azaTHIOprine (IMURAN) 50 MG tablet Take by mouth.     Blood Glucose Monitoring Suppl (ACCU-CHEK GUIDE ME) w/Device KIT Use to check TID. 1 kit 0   brimonidine (ALPHAGAN) 0.2 % ophthalmic solution Place 1 drop into the right eye 2 (two) times daily.     calcitRIOL (ROCALTROL) 0.25 MCG capsule Take 2 capsules (0.5 mcg total) by mouth daily. 180 capsule 3   calcium carbonate (OS-CAL) 1250 (500 Ca) MG chewable tablet Chew 1 tablet (1,250 mg total) by mouth 3 (three) times daily. 270 tablet 3   Cholecalciferol (VITAMIN D3) 10 MCG (400 UNIT) tablet Take 1 tablet (400 Units total) by mouth daily. 90 tablet 3   clotrimazole (LOTRIMIN) 1 % cream Apply 1 Application topically 2 (two) times daily. To both feet at base of toes 30 g 1   Continuous Glucose Sensor (FREESTYLE LIBRE 3 SENSOR) MISC USE 1 EACH EVERY 14 (FOURTEEN) DAYS.     dorzolamide-timolol (COSOPT) 22.3-6.8 MG/ML ophthalmic solution Place 1 drop into both eyes 2 (two) times daily 40 mL 3   empagliflozin (JARDIANCE) 25 MG TABS tablet Take 1 tablet (25 mg total) by mouth daily before breakfast. 90 tablet 3   ferrous sulfate 325 (65 FE) MG tablet Take by mouth.     fluticasone (FLONASE) 50 MCG/ACT nasal spray SPRAY 2 SPRAYS INTO EACH NOSTRIL EVERY DAY 48 mL 1   levothyroxine (SYNTHROID) 88 MCG tablet Take 1 tablet (88 mcg total) by mouth daily. 90 tablet 3   meloxicam (MOBIC) 15 MG tablet TAKE 1 TABLET (15 MG TOTAL) BY MOUTH DAILY. 30 tablet 2   metFORMIN (GLUCOPHAGE) 850 MG tablet Take 1 tablet (850 mg total) by mouth 2 (two) times daily with a meal. 180 tablet 3   Travoprost, BAK Free, (TRAVATAN) 0.004 % SOLN ophthalmic solution Place  1 drop into both eyes nightly 2.5 mL 6   valACYclovir (VALTREX) 1000 MG tablet Take 1 tablet by mouth daily.     omeprazole (PRILOSEC) 40 MG capsule Take 1 capsule (40 mg total) by mouth daily. (Patient not taking: Reported on 11/18/2022) 90 capsule 0   No current facility-administered medications on file prior to visit.    Allergies  Allergen Reactions   Clindamycin Rash   Penicillins Rash    Patient was placed on a form of penicillin by her dentist and developed a rash therefore medication was changed to clindamycin.  Per son, patient is not allergic to clindamycin  Patient was placed on a form of penicillin by her dentist and developed a rash therefore medication was changed to clindamycin.  Per son, patient is not allergic to clindamycin Patient was placed on a form of penicillin by her dentist and developed a rash therefore medication was changed to clindamycin.  Per son, patient is not allergic to clindamycin    Patient was placed on a form of penicillin by her dentist and developed a rash therefore medication was changed to clindamycin.  Per son, patient is not allergic to clindamycin    Social History   Socioeconomic History   Marital status: Widowed    Spouse name: Not on file   Number of children: Not on file   Years of education: Not on file   Highest education level: Not on file  Occupational History   Not on file  Tobacco Use   Smoking status: Never   Smokeless tobacco: Never  Vaping Use   Vaping status: Never Used  Substance and Sexual Activity   Alcohol use: No   Drug use: No   Sexual activity: Not Currently  Other Topics Concern   Not on file  Social History Narrative   Not on file   Social Determinants of Health   Financial Resource Strain: Low Risk  (10/25/2022)   Overall Financial Resource Strain (CARDIA)    Difficulty of Paying Living Expenses: Not hard at all  Food Insecurity: No Food Insecurity (10/25/2022)   Hunger Vital Sign    Worried About  Running Out of Food in the Last Year: Never true    Ran Out of Food in the Last Year: Never true  Transportation Needs: No Transportation Needs (10/25/2022)   PRAPARE - Administrator, Civil Service (Medical): No    Lack of Transportation (Non-Medical): No  Physical Activity: Inactive (10/25/2022)   Exercise Vital Sign    Days of Exercise per Week: 0 days    Minutes of Exercise per Session: 0 min  Stress: No Stress Concern Present (10/25/2022)   [employer] of Occupational Health - Occupational Stress Questionnaire    Feeling of Stress : Not at all  Social Connections: Moderately Isolated (10/25/2022)   Social Connection and Isolation Panel [NHANES]    Frequency of Communication with Friends and Family: More than three times a week    Frequency of Social Gatherings with Friends and Family: Three times a week    Attends Religious Services: 1 to 4 times per year    Active Member of Clubs or Organizations: No    Attends Banker Meetings: Never    Marital Status: Widowed  Intimate Partner Violence: Not At Risk (10/25/2022)   Humiliation, Afraid, Rape, and Kick questionnaire    Fear of Current or Ex-Partner: No    Emotionally Abused: No    Physically Abused: No    Sexually Abused: No    Family History  Problem Relation Age of Onset   Diabetes Mother    Colon cancer Neg Hx    Stomach cancer Neg Hx    Esophageal cancer Neg Hx     Past Surgical History:  Procedure Laterality Date   ABDOMINAL HYSTERECTOMY     GLAUCOMA SURGERY     THYROIDECTOMY     TOTAL HIP ARTHROPLASTY Right 06/23/2020   Procedure: RIGHT TOTAL HIP ARTHROPLASTY ANTERIOR APPROACH;  Surgeon: Kathryne Hitch, MD;  Location: [facility] OR;  Service: Orthopedics;  Laterality: Right;    ROS: Review of Systems  Constitutional:  No recent falls. Eats small portions. Sleeping okay  Genitourinary:  Positive for dysuria.   Negative except as stated above  PHYSICAL EXAM: BP  112/72 (BP Location: Right Arm, Patient Position: Sitting, Cuff Size: Normal)   Pulse 75   Temp 98.3 F (36.8 C) (Oral)   Ht 5\' 4"  (1.626 m)   Wt 166 lb (75.3 kg)   SpO2 98%   BMI 28.49 kg/m   Wt Readings from Last 3 Encounters:  11/18/22 166 lb (75.3 kg)  10/25/22 169 lb (76.7 kg)  06/20/22 169 lb 9.6 oz (76.9 kg)    Physical Exam  General appearance - alert, well appearing, elderly female and in no distress Mental status - normal mood, behavior, speech, dress, motor activity, and thought processes Eyes - arcus seniles Ears - bilateral TM's and external ear canals normal Nose -enlarged nasal turbinate left side. Mouth - mucous membranes moist, pharynx normal without lesions.  Throat is without erythema or exudates. Neck - supple, no significant adenopathy Lymphatics - no palpable lymphadenopathy, no hepatosplenomegaly Chest - clear to auscultation, no wheezes, rales or rhonchi, symmetric air entry Heart - normal rate, regular rhythm, normal S1, S2, no murmurs, rubs, clicks or gallops Abdomen - soft, nontender, nondistended, no masses or organomegaly Musculoskeletal - no joint tenderness, deformity or swelling Extremities - peripheral pulses normal, no pedal edema, no clubbing or cyanosis Skin -dark material noted in the umbilicus.  Patient asked about this.  Some of it was removed and appeared to be dirt that has accumulated in this area.  Strep test negative.     Latest Ref Rng & Units 06/14/2022    4:50 PM 12/14/2020   10:15 AM 12/04/2020    3:33 AM  CMP  Glucose 70 - 99 mg/dL 161  096  045   BUN 8 - 27 mg/dL 31  26  33   Creatinine 0.57 - 1.00 mg/dL 4.09  8.11  9.14   Sodium 134 - 144 mmol/L 137  137  139   Potassium 3.5 - 5.2 mmol/L 4.6  4.0  4.6   Chloride 96 - 106 mmol/L 100  100  102   CO2 20 - 29 mmol/L 22  23  22    Calcium 8.7 - 10.3 mg/dL 9.5  9.2  8.9   Total Protein 6.0 - 8.5 g/dL  7.4    Total Bilirubin 0.0 - 1.2 mg/dL  0.4    Alkaline Phos 44 - 121 IU/L   112    AST 0 - 40 IU/L  26    ALT 0 - 32 IU/L  20     Lipid Panel     Component Value Date/Time   CHOL 177 12/14/2020 1015   TRIG 88 12/14/2020 1015   HDL 84 12/14/2020 1015   CHOLHDL 2.1 12/14/2020 1015   LDLCALC 77 12/14/2020 1015    CBC    Component Value Date/Time   WBC 6.0 06/14/2022 1650   WBC 9.0 06/24/2020 0354   RBC 4.34 06/14/2022 1650   RBC 3.43 (L) 06/24/2020 0354   HGB 13.5 06/14/2022 1650   HCT 41.2 06/14/2022 1650   PLT 213 06/14/2022 1650   MCV 95 06/14/2022 1650   MCH 31.1 06/14/2022 1650   MCH 30.3 06/24/2020 0354   MCHC 32.8 06/14/2022 1650   MCHC 32.8 06/24/2020 0354   RDW 11.8 06/14/2022 1650   LYMPHSABS 1.6 06/14/2022 1650   MONOABS 0.5 05/01/2014 1948   EOSABS 0.1 06/14/2022 1650   BASOSABS 0.0 06/14/2022  1650   Results for orders placed or performed in visit on 11/18/22  Microscopic Examination  Result Value Ref Range   WBC, UA 11-30 (A) 0 - 5 /hpf   RBC, Urine 0-2 0 - 2 /hpf   Epithelial Cells (non renal) 0-10 0 - 10 /hpf   Casts None seen None seen /lpf   Bacteria, UA None seen None seen/Few  Microalbumin / creatinine urine ratio  Result Value Ref Range   Creatinine, Urine WILL FOLLOW    Microalbumin, Urine WILL FOLLOW    Microalb/Creat Ratio WILL FOLLOW   Urinalysis, Routine w reflex microscopic  Result Value Ref Range   Specific Gravity, UA 1.023 1.005 - 1.030   pH, UA 6.5 5.0 - 7.5   Color, UA Yellow Yellow   Appearance Ur Clear Clear   Leukocytes,UA 2+ (A) Negative   Protein,UA Negative Negative/Trace   Glucose, UA 3+ (A) Negative   Ketones, UA Negative Negative   RBC, UA Negative Negative   Bilirubin, UA Negative Negative   Urobilinogen, Ur 0.2 0.2 - 1.0 mg/dL   Nitrite, UA Negative Negative   Microscopic Examination See below:   POCT glycosylated hemoglobin (Hb A1C)  Result Value Ref Range   Hemoglobin A1C     HbA1c POC (<> result, manual entry)     HbA1c, POC (prediabetic range)     HbA1c, POC (controlled diabetic  range) 7.9 (A) 0.0 - 7.0 %  POCT glucose (manual entry)  Result Value Ref Range   POC Glucose 220 (A) 70 - 99 mg/dl    ASSESSMENT AND PLAN: 1. Annual physical exam -Advised to clean the bellybutton with warm water and Q-tip to remove dead skin cells that have accumulated  2. Type 2 diabetes mellitus without complication, without long-term current use of insulin (HCC) Not at goal. Add Amaryl 1 mg daily. Continue metformin and Jardiance. Continue healthy eating habits. - POCT glycosylated hemoglobin (Hb A1C) - POCT glucose (manual entry) - glimepiride (AMARYL) 1 MG tablet; Take 1 tablet (1 mg total) by mouth daily with breakfast.  Dispense: 90 tablet; Refill: 1 - Microalbumin / creatinine urine ratio  3. Diabetes mellitus treated with oral medication (HCC) See #1 above  4. Hyperlipidemia associated with type 2 diabetes mellitus (HCC) Continue atorvastatin 20 mg daily  5. Pharyngitis, unspecified etiology Strept test negative Most likely viral pharyngitis.  Advise use of over-the-counter throat lozenges - POCT rapid strep A  6. Dry cough Appears to be resolving.  Observe for now.  7. Dysuria - Urinalysis, Routine w reflex microscopic  8. Stage 3a chronic kidney disease (HCC) Stable.  Will continue to observe especially with her being on metformin.  Advised to avoid NSAIDs.  9. Screening for colon cancer - Fecal occult blood, imunochemical([facility])  10. COVID-19 vaccination declined   11. Encounter for immunization - Flu Vaccine Trivalent High Dose (Fluad)     Patient was given the opportunity to ask questions.  Patient verbalized understanding of the plan and was able to repeat key elements of the plan.   This documentation was completed using Paediatric nurse.  Any transcriptional errors are unintentional.  Orders Placed This Encounter  Procedures   Fecal occult blood, imunochemical([facility])   Microscopic Examination   Flu  Vaccine Trivalent High Dose (Fluad)   Microalbumin / creatinine urine ratio   Urinalysis, Routine w reflex microscopic   POCT glycosylated hemoglobin (Hb A1C)   POCT glucose (manual entry)   POCT rapid strep A  Requested Prescriptions   Signed Prescriptions Disp Refills   glimepiride (AMARYL) 1 MG tablet 90 tablet 1    Sig: Take 1 tablet (1 mg total) by mouth daily with breakfast.    No follow-ups on file.  Jonah Blue, MD, FACP

## 2022-11-19 ENCOUNTER — Telehealth: Payer: Self-pay | Admitting: Internal Medicine

## 2022-11-19 MED ORDER — CIPROFLOXACIN HCL 500 MG PO TABS: 500.0000 mg | ORAL_TABLET | Freq: Two times a day (BID) | ORAL | 0 refills | Status: AC

## 2022-11-19 NOTE — Telephone Encounter (Signed)
PC placed to pt's son [place] today.   Informed him that pt has UTI and that I am sending rxn for abx to her pharmacy.  He expressed understanding and will pick yp prescription.  Results for orders placed or performed in visit on 11/18/22  Microscopic Examination  Result Value Ref Range   WBC, UA 11-30 (A) 0 - 5 /hpf   RBC, Urine 0-2 0 - 2 /hpf   Epithelial Cells (non renal) 0-10 0 - 10 /hpf   Casts None seen None seen /lpf   Bacteria, UA None seen None seen/Few  Microalbumin / creatinine urine ratio  Result Value Ref Range   Creatinine, Urine WILL FOLLOW    Microalbumin, Urine WILL FOLLOW    Microalb/Creat Ratio WILL FOLLOW   Urinalysis, Routine w reflex microscopic  Result Value Ref Range   Specific Gravity, UA 1.023 1.005 - 1.030   pH, UA 6.5 5.0 - 7.5   Color, UA Yellow Yellow   Appearance Ur Clear Clear   Leukocytes,UA 2+ (A) Negative   Protein,UA Negative Negative/Trace   Glucose, UA 3+ (A) Negative   Ketones, UA Negative Negative   RBC, UA Negative Negative   Bilirubin, UA Negative Negative   Urobilinogen, Ur 0.2 0.2 - 1.0 mg/dL   Nitrite, UA Negative Negative   Microscopic Examination See below:   POCT glycosylated hemoglobin (Hb A1C)  Result Value Ref Range   Hemoglobin A1C     HbA1c POC (<> result, manual entry)     HbA1c, POC (prediabetic range)     HbA1c, POC (controlled diabetic range) 7.9 (A) 0.0 - 7.0 %  POCT glucose (manual entry)  Result Value Ref Range   POC Glucose 220 (A) 70 - 99 mg/dl

## 2022-11-20 LAB — MICROSCOPIC EXAMINATION
Bacteria, UA: NONE SEEN
Casts: NONE SEEN /[LPF]

## 2022-11-20 LAB — URINALYSIS, ROUTINE W REFLEX MICROSCOPIC
Bilirubin, UA: NEGATIVE
Ketones, UA: NEGATIVE
Nitrite, UA: NEGATIVE
Protein,UA: NEGATIVE
RBC, UA: NEGATIVE
Specific Gravity, UA: 1.023 (ref 1.005–1.030)
Urobilinogen, Ur: 0.2 mg/dL (ref 0.2–1.0)
pH, UA: 6.5 (ref 5.0–7.5)

## 2022-11-20 LAB — MICROALBUMIN / CREATININE URINE RATIO
Creatinine, Urine: 34.7 mg/dL
Microalb/Creat Ratio: 51 mg/g{creat} — ABNORMAL HIGH (ref 0–29)
Microalbumin, Urine: 17.6 ug/mL

## 2022-11-21 LAB — POCT RAPID STREP A (OFFICE): Rapid Strep A Screen: NEGATIVE

## 2022-11-22 ENCOUNTER — Telehealth: Payer: Self-pay

## 2022-11-22 NOTE — Telephone Encounter (Signed)
Pt was called and no vm was left due to mailbox being full. Information has been sent to nurse pool.   Interpreter id # [id number]

## 2022-11-22 NOTE — Telephone Encounter (Signed)
-----   Message from Jonah Blue sent at 11/20/2022  7:55 PM EST ----- Let patient know she has a small amount of protein in the urine which we will observe for now.  She has a urinary tract infection.  I spoke with her son over the weekend informing him of this and letting him know that I had sent a prescription to her pharmacy for an antibiotic called ciprofloxacin.  Please confirm that she got the antibiotics and is taking it or has completed it.

## 2022-11-26 LAB — FECAL OCCULT BLOOD, IMMUNOCHEMICAL: Fecal Occult Bld: NEGATIVE

## 2022-12-21 ENCOUNTER — Ambulatory Visit: Payer: Medicare HMO | Admitting: Internal Medicine

## 2022-12-21 NOTE — Progress Notes (Deleted)
Name: Norma Clay  MRN/ DOB: [id number], 11/02/1950    Age/ Sex: 72 y.o., female     PCP: Marcine Matar, MD   Reason for Endocrinology Evaluation: Post-surgical hypoparathyroidism     Initial Endocrinology Clinic Visit: 12/08/17    PATIENT IDENTIFIER: Norma Clay is a 72 y.o., female with a past medical history of Total thyroidectomy and T2DM. She has followed with [facility] since 12/08/2017 for consultative assistance with management of her Post-surgical hypoparathyroidism.   HISTORICAL SUMMARY:  She  moved from [place] in 2019. 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 She denies history of thyroid cancer. Pt noted hand spasms following 2nd surgery and has been on Calcium and Calcitriol since. Historically has compliance issues which results in serum calcium fluctuations.  24-hr urine collection normal 06/2018  She is s/p right thyroid nodule FNA while in [place] with benign cytology per patient 08/2021   DIABETES HISTORY: She has been diagnosed with diabetes many years ago.  She has been on metformin, she used to be on glimepiride, Jardiance was started in 2022. Her A1c has ranged from 6.9% in 2020, peaking at 10.0% in 2018    SUBJECTIVE:   Today (12/21/2022):  Norma Clay is here for a follow up on hypocalcemia and hypothyroidism.  She is accompanied by her daughter   Patient follows with [facility] for glaucoma 11/28/2022 She was evaluated by ENT 08/2022 for evaluation of chronic throat discomfort, was diagnosed with dysphagia, paretic left vocal cord as well as nasal septal deviation.  They ordered barium swallow as well as CT of the neck  She continues to complain of throat discomfort which is chronic in nature   Has occasional constipation -she was prescribed MiraLAX  She was seen by her PCP for URI, she continues to complain about cough but it is improving,  but continues to complain of pruritus of the scalp and the nose, she is asking that I check her ears today   Medication: Calcium carbonate (oyster shell) 500 mg, 1 tabs TID Calcitriol 0.25 MCG, 2 caps daily Vitamin D3 400 IU daily Levothyroxine 88 mcg daily  Jardiance 10 mg daily Metformin 850 twice daily   HISTORY:  Past Medical History:  Past Medical History:  Diagnosis Date   Diabetes mellitus without complication (HCC)    Glaucoma    Hypertension    Osteoarthritis of right hip 05/16/2016   Posterior capsular opacification of both eyes, 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 She has never used smokeless tobacco. She reports that she does not drink alcohol and does not use drugs. Family History: family history includes Diabetes in her mother.   HOME MEDICATIONS: Allergies as of 12/21/2022       Reactions   Clindamycin Rash   Penicillins Rash   Patient was placed on a form of penicillin by her dentist and developed a rash therefore medication was changed to clindamycin.  Per son, patient is not allergic  to clindamycin Patient was placed on a form of penicillin by her dentist and developed a rash therefore medication was changed to clindamycin.  Per son, patient is not allergic to clindamycin Patient was placed on a form of penicillin by her dentist and developed a rash therefore medication was changed to clindamycin.  Per son, patient is not allergic to clindamycin    Patient was placed on a form of penicillin by her dentist and developed a rash  therefore medication was changed to clindamycin.  Per son, patient is not allergic to clindamycin        Medication List        Accurate as of December 21, 2022  7:26 AM. If you have any questions, ask your nurse or doctor.          Accu-Chek Guide Me w/Device Kit Use to check TID.   acetaminophen 325 MG tablet Commonly known as: Tylenol Take 1-2 tablets (325-650 mg total) by mouth every 6 (six) hours as needed for moderate pain.   albuterol 108 (90 Base) MCG/ACT inhaler Commonly known as: VENTOLIN HFA TAKE 2 PUFFS BY MOUTH EVERY 6 HOURS AS NEEDED FOR WHEEZE OR SHORTNESS OF BREATH   atorvastatin 20 MG tablet Commonly known as: LIPITOR Take 1 tablet (20 mg total) by mouth daily.   azaTHIOprine 50 MG tablet Commonly known as: IMURAN Take by mouth.   brimonidine 0.2 % ophthalmic solution Commonly known as: ALPHAGAN Place 1 drop into the right eye 2 (two) times daily.   calcitRIOL 0.25 MCG capsule Commonly known as: ROCALTROL Take 2 capsules (0.5 mcg total) by mouth daily.   calcium carbonate 1250 (500 Ca) MG chewable tablet Commonly known as: OS-CAL Chew 1 tablet (1,250 mg total) by mouth 3 (three) times daily.   clotrimazole 1 % cream Commonly known as: LOTRIMIN Apply 1 Application topically 2 (two) times daily. To both feet at base of toes   dorzolamide-timolol 2-0.5 % ophthalmic solution Commonly known as: COSOPT Place 1 drop into both eyes 2 (two) times daily   empagliflozin 25 MG Tabs tablet Commonly known as: Jardiance Take 1 tablet (25 mg total) by mouth daily before breakfast.   ferrous sulfate 325 (65 FE) MG tablet Take by mouth.   fluticasone 50 MCG/ACT nasal spray Commonly known as: FLONASE SPRAY 2 SPRAYS INTO EACH NOSTRIL EVERY DAY   FreeStyle Libre 3 Sensor Misc USE 1 EACH EVERY 14 (FOURTEEN) DAYS.   glimepiride 1 MG tablet Commonly known as: AMARYL Take 1 tablet (1 mg total) by mouth daily with breakfast.   levothyroxine 88 MCG  tablet Commonly known as: SYNTHROID Take 1 tablet (88 mcg total) by mouth daily.   meloxicam 15 MG tablet Commonly known as: MOBIC TAKE 1 TABLET (15 MG TOTAL) BY MOUTH DAILY.   metFORMIN 850 MG tablet Commonly known as: GLUCOPHAGE Take 1 tablet (850 mg total) by mouth 2 (two) times daily with a meal.   omeprazole 40 MG capsule Commonly known as: PRILOSEC Take 1 capsule (40 mg total) by mouth daily.   Travatan Z 0.004 % Soln ophthalmic solution Generic drug: Travoprost (BAK Free) Place 1 drop into both eyes nightly   valACYclovir 1000 MG tablet Commonly known as: VALTREX Take 1 tablet by mouth daily.   Vitamin D3 10 MCG (400 UNIT) tablet Take 1 tablet (400 Units total) by mouth daily.          OBJECTIVE:   PHYSICAL EXAM: [id number] were no vitals taken for this visit.  EXAM:  General: Pt appears well and is in NAD Bilateral external auditory canals clear with intact tympanic membrane No nodules appreciated on thyroid exam  Lungs: Clear with good BS bilat with no rales, rhonchi, or wheezes  Heart: Auscultation: RRR.  Extremities: No pretibial edema   Mental Status: Judgment, insight: Intact Mood and affect: No depression, anxiety, or agitation     DATA REVIEWED:   Latest Reference Range & Units 06/14/22 16:50  Sodium 134 - 144 mmol/L 137  Potassium 3.5 - 5.2 mmol/L 4.6  Chloride 96 - 106 mmol/L 100  CO2 20 - 29 mmol/L 22  Glucose 70 - 99 mg/dL 102 (H)  BUN 8 - 27 mg/dL 31 (H)  Creatinine 7.25 - 1.00 mg/dL 3.66 (H)  Calcium 8.7 - 10.3 mg/dL 9.5  BUN/Creatinine Ratio 12 - 28  28  eGFR >59 mL/min/1.73 53 (L)  (H): Data is abnormally high (L): Data is abnormally low    Latest Reference Range & Units 06/14/22 16:50  Sodium 134 - 144 mmol/L 137  Potassium 3.5 - 5.2 mmol/L 4.6  Chloride 96 - 106 mmol/L 100  CO2 20 - 29 mmol/L 22  Glucose 70 - 99 mg/dL 440 (H)  BUN 8 - 27 mg/dL 31 (H)  Creatinine 3.47 - 1.00 mg/dL 4.25 (H)  Calcium 8.7 - 10.3 mg/dL 9.5   BUN/Creatinine Ratio 12 - 28  28  eGFR >59 mL/min/1.73 53 (L)     Thyroid ultrasound 08/03/2021 @[facility]  There is a 2 cm TR 4 nodule in the midpole region right lobe.  Recommend FNA There is 11 mm TR 3 nodule in the thyroid isthmus Left lobe absent    Thyroid ULtrasound 06/24/2022 FINDINGS: Isthmus: Redemonstrated nodular tissue within isthmic resection bed measuring 1.8 x 1.1 x 0.5 cm, previously, 2.2 x 1.3 x 0.3 cm when compared to the 12/2019 examination.   Right lobe: Redemonstrated nodular tissue within the right lobectomy resection bed measuring 2.0 x 1.6 x 1.4 cm, previously, 2.2 x 1.4 x 1.3 cm, when compared to the 12/2019 examination   Left lobe: No definitive residual soft tissue is seen within the left lobectomy resection bed.   _________________________________________________________   No regional cervical lymphadenopathy.   IMPRESSION: Stable sequela of presumed subtotal thyroidectomy with residual nodular tissue within the isthmic and right lobe resection beds, grossly unchanged compared to the 12/2019 examination and without definitive evidence of progressive residual or recurrent disease.       Old records , labs and images have been reviewed.    ASSESSMENT / PLAN / RECOMMENDATIONS:   Surgical Hypoparathyroidism :    - Pt is asymptomatic  - Calcium level is normal  -24-hour urine collection was acceptable at 213 on 06/03/2020, this was 166 in 2020   Medications:  Continue Calcium Carbonate 500 mg 1 tabs TID Continue  Calcitriol 0.25 mcg ( 2 Caps ) daily   Continue Vitamin D3 400 iu daily     2. Postsurgical hypothyroidism :  - Clinically euthyroid  - TSH is within normal range   Continue levothyroxine 88 mcg daily     3. Multinodular Goiter:  -S/p left thyroidectomy while living in sedan due to benign pathology -Thyroid ultrasound 12/17/2020 showed isthmic and a right thyroid nodule meeting criteria for 1  year follow-up -She had a repeat thyroid ultrasound 08/2021 while living in [place], she is s/p benign FNA of the right nodule 08/2021 per patient -Will proceed with thyroid ultrasound   4. T2DM, Sub-optimally  Controlled   -Historically this has been managed by her PCP -She brought in her CGM today, we were unable to download, but the patient has been noted with postprandial hyperglycemia -We will continue metformin as below but increase Jardiance  Medication Continue metformin 850 mg twice daily Glimepiride 1 mg daily Increase Jardiance 25 mg daily   F/u in 6 months        Signed electronically by: Lyndle Herrlich, MD  [facility] Endocrinology  [facility] [address] [place] [place], [place] [address] Phone: [phone] FAX: [phone]      CC: Marcine Matar, MD [address] [address] [place] [address] Phone: [phone]  Fax: [phone]   Return to Endocrinology clinic as below: Future Appointments  Date Time Provider Department Center  12/21/2022 11:10 AM Durenda Pechacek, Konrad Dolores, MD [facility] None  03/20/2023 10:30 AM Marcine Matar, MD [facility] None  10/24/2023  9:20 AM [facility] ANNUAL WELLNESS VISIT [facility] None

## 2022-12-26 ENCOUNTER — Encounter: Payer: Self-pay | Admitting: Internal Medicine

## 2022-12-26 ENCOUNTER — Ambulatory Visit: Payer: Medicare Other | Admitting: Internal Medicine

## 2022-12-26 VITALS — BP 134/80 | HR 71 | Ht 64.0 in | Wt 167.0 lb

## 2022-12-26 DIAGNOSIS — E119 Type 2 diabetes mellitus without complications: Secondary | ICD-10-CM | POA: Diagnosis not present

## 2022-12-26 DIAGNOSIS — E892 Postprocedural hypoparathyroidism: Secondary | ICD-10-CM | POA: Diagnosis not present

## 2022-12-26 DIAGNOSIS — E890 Postprocedural hypothyroidism: Secondary | ICD-10-CM

## 2022-12-26 DIAGNOSIS — Z7984 Long term (current) use of oral hypoglycemic drugs: Secondary | ICD-10-CM

## 2022-12-26 MED ORDER — EMPAGLIFLOZIN 25 MG PO TABS: 25.0000 mg | ORAL_TABLET | Freq: Every day | ORAL | 3 refills | Status: DC

## 2022-12-26 MED ORDER — REPAGLINIDE 0.5 MG PO TABS: 0.5000 mg | ORAL_TABLET | Freq: Every day | ORAL | 3 refills | Status: DC

## 2022-12-26 MED ORDER — METFORMIN HCL 850 MG PO TABS: 850.0000 mg | ORAL_TABLET | Freq: Two times a day (BID) | ORAL | 3 refills | Status: DC

## 2022-12-26 NOTE — Progress Notes (Unsigned)
Name: Norma Clay  MRN/ DOB: [id number], June 16, 1950    Age/ Sex: 72 y.o., female     PCP: Norma Matar, MD   Reason for Endocrinology Evaluation: Post-surgical hypoparathyroidism     Initial Endocrinology Clinic Visit: 12/08/17    PATIENT IDENTIFIER: Norma Clay is a 72 y.o., female with a past medical history of Total thyroidectomy and T2DM. She has followed with [facility] since 12/08/2017 for consultative assistance with management of her Post-surgical hypoparathyroidism.   HISTORICAL SUMMARY:  She  moved from [place] in 2019. 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 She denies history of thyroid cancer. Pt noted hand spasms following 2nd surgery and has been on Calcium and Calcitriol since. Historically has compliance issues which results in serum calcium fluctuations.  24-hr urine collection normal 06/2018  She is s/p right thyroid nodule FNA while in [place] with benign cytology per patient 08/2021   DIABETES HISTORY: She has been diagnosed with diabetes many years ago.  She has been on metformin, she used to be on glimepiride, Jardiance was started in 2022. Her A1c has ranged from 6.9% in 2020, peaking at 10.0% in 2018    SUBJECTIVE:   Today (12/26/2022):  Norma Clay is here for a follow up on hypocalcemia and hypothyroidism.  She is accompanied by her daughter   Patient follows with [facility] for glaucoma 11/28/2022 She was evaluated by ENT 08/2022 for evaluation of chronic throat discomfort, was diagnosed with dysphagia, paretic left vocal cord as well as nasal septal deviation.  They ordered barium swallow as well as CT of the neck  Continue with occasional  constipation She continues with  Has occasional chest pain with movement that she attributes to a fall a few years ago  No palpitations   Medication: Calcium carbonate (oyster shell) 500 mg, 1  tabs TID Calcitriol 0.25 MCG, 2 caps daily Vitamin D3 400 IU daily Levothyroxine 88 mcg daily  Jardiance 25 mg daily Metformin 850 twice daily Glimepiride 1 mg daily      CONTINUOUS GLUCOSE MONITORING RECORD INTERPRETATION    Dates of Recording: 12/10 - 12/26/2022  Sensor description: Freestyle libre  Results statistics:   CGM use % of time 95  Average and SD 129/35.2  Time in range    76    %  % Time Above 180 14  % Time above 250 0  % Time Below target 10   Glycemic patterns summary: BG's trend down overnight and increased throughout the day  Hyperglycemic episodes postprandial  Hypoglycemic episodes occurred overnight  Overnight periods: Trends down   HISTORY:  Past Medical History:  Past Medical History:  Diagnosis Date   Diabetes mellitus without complication (HCC)    Glaucoma    Hypertension    Osteoarthritis of right hip 05/16/2016   Posterior capsular opacification of both eyes, 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 She has never used smokeless tobacco. She reports that she does not  drink alcohol and does not use drugs. Family History: family history includes Diabetes in her mother.   HOME MEDICATIONS: Allergies as of 12/26/2022       Reactions   Clindamycin Rash   Penicillins Rash   Patient was placed on a form of penicillin by her dentist and developed a rash therefore medication was changed to clindamycin.  Per son, patient is not allergic to clindamycin Patient was placed on a form of penicillin by her dentist and  developed a rash therefore medication was changed to clindamycin.  Per son, patient is not allergic to clindamycin Patient was placed on a form of penicillin by her dentist and developed a rash therefore medication was changed to clindamycin.  Per son, patient is not allergic to clindamycin    Patient was placed on a form of penicillin by her dentist and developed a rash therefore medication was changed to clindamycin.  Per son, patient is not allergic to clindamycin        Medication List        Accurate as of December 26, 2022  1:55 PM. If you have any questions, ask your nurse or doctor.          STOP taking these medications    glimepiride 1 MG tablet Commonly known as: AMARYL Stopped by: Norma Clay       TAKE these medications    Accu-Chek Guide Me w/Device Kit Use to check TID.   acetaminophen 325 MG tablet Commonly known as: Tylenol Take 1-2 tablets (325-650 mg total) by mouth every 6 (six) hours as needed for moderate pain.   albuterol 108 (90 Base) MCG/ACT inhaler Commonly known as: VENTOLIN HFA TAKE 2 PUFFS BY MOUTH EVERY 6 HOURS AS NEEDED FOR WHEEZE OR SHORTNESS OF BREATH   atorvastatin 20 MG tablet Commonly known as: LIPITOR Take 1 tablet (20 mg total) by mouth daily.   azaTHIOprine 50 MG tablet Commonly known as: IMURAN Take by mouth.   brimonidine 0.2 % ophthalmic solution Commonly known as: ALPHAGAN Place 1 drop into the right eye 2 (two) times daily.   calcitRIOL 0.25 MCG capsule Commonly known as: ROCALTROL Take 2 capsules (0.5 mcg total) by mouth daily.   calcium carbonate 1250 (500 Ca) MG chewable tablet Commonly known as: OS-CAL Chew 1 tablet (1,250 mg total) by mouth 3 (three) times daily.   clotrimazole 1 % cream Commonly known as: LOTRIMIN Apply 1 Application topically 2 (two) times daily. To both feet at base of toes   dorzolamide-timolol 2-0.5 % ophthalmic solution Commonly known as: COSOPT Place 1 drop into both eyes  2 (two) times daily   empagliflozin 25 MG Tabs tablet Commonly known as: Jardiance Take 1 tablet (25 mg total) by mouth daily before breakfast.   ferrous sulfate 325 (65 FE) MG tablet Take by mouth.   fluticasone 50 MCG/ACT nasal spray Commonly known as: FLONASE SPRAY 2 SPRAYS INTO EACH NOSTRIL EVERY DAY   FreeStyle Libre 3 Sensor Misc USE 1 EACH EVERY 14 (FOURTEEN) DAYS.   levothyroxine 88 MCG tablet Commonly known as: SYNTHROID Take 1 tablet (88 mcg total) by mouth daily.   meloxicam 15 MG tablet Commonly known as: MOBIC TAKE 1 TABLET (15 MG TOTAL) BY MOUTH DAILY.   metFORMIN 850 MG tablet Commonly known as: GLUCOPHAGE Take 1 tablet (850 mg total) by mouth 2 (two) times daily with a meal.   omeprazole 40 MG capsule Commonly known as: PRILOSEC Take 1 capsule (40 mg total) by mouth  daily.   repaglinide 0.5 MG tablet Commonly known as: PRANDIN Take 1 tablet (0.5 mg total) by mouth daily before breakfast. Started by: Norma Clay   Travatan Z 0.004 % Soln ophthalmic solution Generic drug: Travoprost (BAK Free) Place 1 drop into both eyes nightly   valACYclovir 1000 MG tablet Commonly known as: VALTREX Take 1 tablet by mouth daily.   Vitamin D3 10 MCG (400 UNIT) tablet Take 1 tablet (400 Units total) by mouth daily.          OBJECTIVE:   PHYSICAL EXAM: VS:BP 134/80   Pulse 71   Ht 5\' 4"  (1.626 m)   Wt 167 lb (75.8 kg)   SpO2 99%   BMI 28.67 kg/m   EXAM: General: Pt appears well and is in NAD No thyroid nodules appreciated  Lungs: Clear with good BS bilat   Heart: Auscultation: RRR.  Extremities: No pretibial edema   Mental Status: Judgment, insight: Intact Mood and affect: No depression, anxiety, or agitation     DATA REVIEWED:  ****    Thyroid ultrasound 08/03/2021 @[facility]  There is a 2 cm TR 4 nodule in the midpole region right lobe.  Recommend FNA There is 11 mm TR 3 nodule in the thyroid isthmus Left  lobe absent    Thyroid ULtrasound 06/24/2022 FINDINGS: Isthmus: Redemonstrated nodular tissue within isthmic resection bed measuring 1.8 x 1.1 x 0.5 cm, previously, 2.2 x 1.3 x 0.3 cm when compared to the 12/2019 examination.   Right lobe: Redemonstrated nodular tissue within the right lobectomy resection bed measuring 2.0 x 1.6 x 1.4 cm, previously, 2.2 x 1.4 x 1.3 cm, when compared to the 12/2019 examination   Left lobe: No definitive residual soft tissue is seen within the left lobectomy resection bed.   _________________________________________________________   No regional cervical lymphadenopathy.   IMPRESSION: Stable sequela of presumed subtotal thyroidectomy with residual nodular tissue within the isthmic and right lobe resection beds, grossly unchanged compared to the 12/2019 examination and without definitive evidence of progressive residual or recurrent disease.       Old records , labs and images have been reviewed.    ASSESSMENT / PLAN / RECOMMENDATIONS:   Surgical Hypoparathyroidism :    - Pt is asymptomatic  - Calcium level *** -24-hour urine collection was acceptable at 213 on 06/03/2020, this was 166 in 2020   Medications:  Continue Calcium Carbonate 500 mg 1 tabs TID Continue  Calcitriol 0.25 mcg ( 2 Caps ) daily   Continue Vitamin D3 400 iu daily     2. Postsurgical hypothyroidism :  - Clinically euthyroid  - TSH ***  Continue levothyroxine 88 mcg daily     3. Multinodular Goiter:  -S/p left thyroidectomy [place] living in [place] due to benign pathology -Thyroid ultrasound 12/17/2020 showed isthmic and a right thyroid nodule meeting criteria for 1 year follow-up -She had a repeat thyroid ultrasound 08/2021 while living in [place], she is s/p benign FNA of the right nodule 08/2021 per patient -Repeat thyroid ultrasound shows stability from 2021   4. T2DM, Sub-optimally Controlled   -A1c is elevated but the patient has been noted with  hypoglycemia overnight -I suspect part of this hypoglycemia is inaccurate due to freestyle libre, I will change glimepiride to repaglinide due to shorter half-life -Emphasized the importance of taking repaglinide 15-20 minutes before the first meal of the day   medication Stop glimepiride Start repaglinide 0.5 mg, 1 tablet before breakfast Continue metformin  850 mg twice daily Continue  Jardiance 25 mg daily   F/u in 6 months      Signed electronically by: Norma Herrlich, MD  [facility] Endocrinology  [facility] [address] [place] [place], [place] [address] Phone: [phone] FAX: [phone]      CC: Norma Matar, MD [address] [place] [address] [place] [address] Phone: [phone]  Fax: [phone]   Return to Endocrinology clinic as below: Future Appointments  Date Time Provider Department Center  03/20/2023 10:30 AM Norma Matar, MD [facility] None  06/28/2023 11:10 AM Norma Clay, Konrad Dolores, MD [facility] None  10/24/2023  9:20 AM [facility] ANNUAL WELLNESS VISIT [facility] None

## 2022-12-26 NOTE — Patient Instructions (Signed)
Stop Glimepiride  Start Repaglinide 0.5 mg , 1 tablet BEFORE Breakfast daily  Continue Metformin 850 mg twice daily  Continue Jardiance 25 mg daily      Continue Calcium Carbonate  1 tablet three times daily for now  Continue Calcitriol two capsules a day  Continue Over the counter Vitamin D3 400 iu daily  Continue Levothyroxine 88 mcg daily

## 2022-12-27 LAB — BASIC METABOLIC PANEL
BUN: 24 mg/dL (ref 7–25)
CO2: 28 mmol/L (ref 20–32)
Calcium: 8.8 mg/dL (ref 8.6–10.4)
Chloride: 104 mmol/L (ref 98–110)
Creat: 0.85 mg/dL (ref 0.60–1.00)
Glucose, Bld: 118 mg/dL — ABNORMAL HIGH (ref 65–99)
Potassium: 3.9 mmol/L (ref 3.5–5.3)
Sodium: 139 mmol/L (ref 135–146)

## 2022-12-27 LAB — ALBUMIN: Albumin: 4.1 g/dL (ref 3.6–5.1)

## 2022-12-27 LAB — TSH: TSH: 0.93 m[IU]/L (ref 0.40–4.50)

## 2022-12-27 LAB — VITAMIN D 25 HYDROXY (VIT D DEFICIENCY, FRACTURES): Vit D, 25-Hydroxy: 38 ng/mL (ref 30–100)

## 2022-12-29 MED ORDER — CALCITRIOL 0.25 MCG PO CAPS: 0.5000 ug | ORAL_CAPSULE | Freq: Every day | ORAL | 3 refills | Status: DC

## 2022-12-29 MED ORDER — LEVOTHYROXINE SODIUM 88 MCG PO TABS: 88.0000 ug | ORAL_TABLET | Freq: Every day | ORAL | 3 refills | Status: DC

## 2023-01-05 ENCOUNTER — Encounter: Payer: Self-pay | Admitting: Internal Medicine

## 2023-02-08 ENCOUNTER — Encounter: Payer: Self-pay | Admitting: Internal Medicine

## 2023-02-09 ENCOUNTER — Other Ambulatory Visit: Payer: Self-pay | Admitting: Critical Care Medicine

## 2023-02-09 ENCOUNTER — Other Ambulatory Visit: Payer: Self-pay | Admitting: Internal Medicine

## 2023-02-09 ENCOUNTER — Other Ambulatory Visit: Payer: Self-pay

## 2023-02-09 ENCOUNTER — Other Ambulatory Visit: Payer: Self-pay | Admitting: Nurse Practitioner

## 2023-02-09 DIAGNOSIS — J4520 Mild intermittent asthma, uncomplicated: Secondary | ICD-10-CM

## 2023-02-09 DIAGNOSIS — J301 Allergic rhinitis due to pollen: Secondary | ICD-10-CM

## 2023-02-09 MED ORDER — DEXCOM G7 SENSOR MISC: 3 refills | Status: DC

## 2023-02-12 ENCOUNTER — Other Ambulatory Visit: Payer: Self-pay | Admitting: Internal Medicine

## 2023-02-12 DIAGNOSIS — J301 Allergic rhinitis due to pollen: Secondary | ICD-10-CM

## 2023-02-12 DIAGNOSIS — J4520 Mild intermittent asthma, uncomplicated: Secondary | ICD-10-CM

## 2023-02-14 ENCOUNTER — Other Ambulatory Visit: Payer: Self-pay

## 2023-02-14 MED ORDER — DEXCOM G7 SENSOR MISC: 11 refills | Status: DC

## 2023-02-16 ENCOUNTER — Other Ambulatory Visit: Payer: Self-pay | Admitting: Internal Medicine

## 2023-02-16 DIAGNOSIS — E1169 Type 2 diabetes mellitus with other specified complication: Secondary | ICD-10-CM

## 2023-02-21 ENCOUNTER — Other Ambulatory Visit: Payer: Self-pay

## 2023-02-24 ENCOUNTER — Encounter: Payer: Self-pay | Admitting: Internal Medicine

## 2023-02-25 ENCOUNTER — Other Ambulatory Visit: Payer: Self-pay | Admitting: Internal Medicine

## 2023-02-25 DIAGNOSIS — E1169 Type 2 diabetes mellitus with other specified complication: Secondary | ICD-10-CM

## 2023-02-25 MED ORDER — ATORVASTATIN CALCIUM 20 MG PO TABS: 20.0000 mg | ORAL_TABLET | Freq: Every day | ORAL | 2 refills | Status: DC

## 2023-02-26 ENCOUNTER — Encounter: Payer: Self-pay | Admitting: Internal Medicine

## 2023-02-27 ENCOUNTER — Other Ambulatory Visit: Payer: Self-pay

## 2023-02-27 MED ORDER — DEXCOM G7 SENSOR MISC: 11 refills | Status: DC

## 2023-02-28 LAB — HM DIABETES EYE EXAM

## 2023-03-09 ENCOUNTER — Other Ambulatory Visit (HOSPITAL_COMMUNITY): Payer: Self-pay

## 2023-03-09 ENCOUNTER — Telehealth: Payer: Self-pay

## 2023-03-09 MED ORDER — DEXCOM G7 SENSOR MISC: 11 refills | Status: DC

## 2023-03-09 NOTE — Telephone Encounter (Signed)
 Pharmacy Patient Advocate Encounter   Received notification from Pt Calls Messages that prior authorization for Dexcom G7 sensor is required/requested.   Insurance verification completed.   The patient is insured through [employer] .   Per test claim: Bill claim to Medicare B

## 2023-03-09 NOTE — Telephone Encounter (Signed)
 Dexcom needs PA

## 2023-03-09 NOTE — Addendum Note (Signed)
 Addended by: Lisabeth Pick on: 03/09/2023 11:15 AM   Modules accepted: Orders

## 2023-03-20 ENCOUNTER — Encounter: Payer: Self-pay | Admitting: Internal Medicine

## 2023-03-20 ENCOUNTER — Ambulatory Visit: Payer: Self-pay | Attending: Internal Medicine | Admitting: Internal Medicine

## 2023-03-20 VITALS — BP 121/74 | HR 70 | Temp 97.9°F | Ht 64.0 in | Wt 159.0 lb

## 2023-03-20 DIAGNOSIS — I10 Essential (primary) hypertension: Secondary | ICD-10-CM | POA: Insufficient documentation

## 2023-03-20 DIAGNOSIS — E1122 Type 2 diabetes mellitus with diabetic chronic kidney disease: Secondary | ICD-10-CM | POA: Insufficient documentation

## 2023-03-20 DIAGNOSIS — E785 Hyperlipidemia, unspecified: Secondary | ICD-10-CM | POA: Diagnosis not present

## 2023-03-20 DIAGNOSIS — R1010 Upper abdominal pain, unspecified: Secondary | ICD-10-CM | POA: Insufficient documentation

## 2023-03-20 DIAGNOSIS — D649 Anemia, unspecified: Secondary | ICD-10-CM

## 2023-03-20 DIAGNOSIS — E1142 Type 2 diabetes mellitus with diabetic polyneuropathy: Secondary | ICD-10-CM | POA: Diagnosis not present

## 2023-03-20 DIAGNOSIS — E890 Postprocedural hypothyroidism: Secondary | ICD-10-CM | POA: Diagnosis not present

## 2023-03-20 DIAGNOSIS — D631 Anemia in chronic kidney disease: Secondary | ICD-10-CM | POA: Diagnosis not present

## 2023-03-20 DIAGNOSIS — R5383 Other fatigue: Secondary | ICD-10-CM

## 2023-03-20 DIAGNOSIS — E1169 Type 2 diabetes mellitus with other specified complication: Secondary | ICD-10-CM

## 2023-03-20 DIAGNOSIS — N1831 Chronic kidney disease, stage 3a: Secondary | ICD-10-CM | POA: Insufficient documentation

## 2023-03-20 DIAGNOSIS — H409 Unspecified glaucoma: Secondary | ICD-10-CM | POA: Insufficient documentation

## 2023-03-20 DIAGNOSIS — Z79899 Other long term (current) drug therapy: Secondary | ICD-10-CM | POA: Diagnosis not present

## 2023-03-20 DIAGNOSIS — Z79890 Hormone replacement therapy (postmenopausal): Secondary | ICD-10-CM | POA: Insufficient documentation

## 2023-03-20 DIAGNOSIS — E119 Type 2 diabetes mellitus without complications: Secondary | ICD-10-CM | POA: Diagnosis present

## 2023-03-20 DIAGNOSIS — R300 Dysuria: Secondary | ICD-10-CM | POA: Diagnosis not present

## 2023-03-20 DIAGNOSIS — Z1231 Encounter for screening mammogram for malignant neoplasm of breast: Secondary | ICD-10-CM

## 2023-03-20 DIAGNOSIS — Z7984 Long term (current) use of oral hypoglycemic drugs: Secondary | ICD-10-CM | POA: Insufficient documentation

## 2023-03-20 DIAGNOSIS — E1151 Type 2 diabetes mellitus with diabetic peripheral angiopathy without gangrene: Secondary | ICD-10-CM | POA: Insufficient documentation

## 2023-03-20 DIAGNOSIS — Z8679 Personal history of other diseases of the circulatory system: Secondary | ICD-10-CM

## 2023-03-20 LAB — GLUCOSE, POCT (MANUAL RESULT ENTRY): POC Glucose: 213 mg/dL — AB (ref 70–99)

## 2023-03-20 LAB — POCT GLYCOSYLATED HEMOGLOBIN (HGB A1C): HbA1c, POC (controlled diabetic range): 7.5 % — AB (ref 0.0–7.0)

## 2023-03-20 MED ORDER — TRUE METRIX BLOOD GLUCOSE TEST VI STRP: ORAL_STRIP | 12 refills | Status: DC

## 2023-03-20 NOTE — Progress Notes (Signed)
 Patient ID: Norma Clay, female    DOB: 06/05/1950  MRN: [id number]  CC: Diabetes (DM f/u./Reuqesting refill on strips/Reports not taking BP meds for over a yr - discuss if use is still needed. Reports that BP yesterday is 157/94/Already received flu vax)   Subjective: [name] is a 73 y.o. female who presents for chronic ds management. Daughter, Lina Sar, is with her.  Wavier signed from previous visit.  Daughter interprets.  Her concerns today include:  Patient with history of DM type II, HTN, PAD, post surgical hypothyroidism, glaucoma, chorioretinal inflammation bilaterally, CKD 3a   Discussed the use of AI scribe software for clinical note transcription with the patient, who gave verbal consent to proceed.  History of Present Illness   The patient, with a history of diabetes, hypertension, hyperlipidemia, hypothyroidism presents for a four-month follow-up visit.   DM: Results for orders placed or performed in visit on 03/20/23  POCT glucose (manual entry)   Collection Time: 03/20/23 11:16 AM  Result Value Ref Range   POC Glucose 213 (A) 70 - 99 mg/dl  POCT glycosylated hemoglobin (Hb A1C)   Collection Time: 03/20/23 11:20 AM  Result Value Ref Range   Hemoglobin A1C     HbA1c POC (<> result, manual entry)     HbA1c, POC (prediabetic range)     HbA1c, POC (controlled diabetic range) 7.5 (A) 0.0 - 7.0 %  The patient's diabetes is managed with metformin 850 mg BID, Jardiance 25mg , and repaglinide 0.5 mg before BF, with a recent improvement in HbA1c from 7.9 to 7.5.  Amaryl was prescribed on last visit.  However when she saw her endocrinologist Dr. Brooks Sailors in December, she reported some hypoglycemic episodes.  Amaryl was changed to Prandin.  -checking blood sugars manually twice a day.  Readings before breakfast 99 to 102 hours after breakfast 104-156.  She eats small portions.  -Ambulates with a cane.  Has had some falls in the last year but none this year so far.  -Reports  some constant feeling of the feet being hot  HTN: She has been off blood pressure medication for over a year.  Blood pressure today is good and was good on last visit.  Just check blood pressure yesterday at home and reports reading was 156/94.  She limits salt in foods.  HL: Compliant with taking atorvastatin 20 mg daily  Postoperative hypothyroidism/hypoparathyroidism: Followed by Dr. Brooks Sailors.  She is on levothyroxine 88 mcg daily.  Recent TSH was normal.  Also on vitamin D 400 international units daily, Calcitrol 0.25 mcg daily and calcium 3 times a day.  Seen at urgent care 3 days ago for urinary symptoms, including discomfort during urination.  UA was negative for UTI.  Did show increased glucose in the urine.  These symptoms have improved slightly in recent days but still present.   Reports pain in her abdomen at times.  She points all over the abdomen.  Also concerned that her umbilicus is sore.  Complains of fatigue.  Sleeps about 7 hours at night.  Takes naps during the day.  Unclear from history whether she wakes up feeling refreshed or not.  She states that she worries about her family and [place].  Recent TSH was normal. -Noted to have mild anemia on CBC done through [facility] in October with H/H of 12.2/35.8 and normal MCV.  GFR has been in the 50s to 60s.     Patient Active Problem List   Diagnosis Date Noted  Stage 3a chronic kidney disease (HCC) 03/20/2023   Postsurgical hypothyroidism 12/26/2022   Mixed hyperlipidemia 08/04/2021   Gastroesophageal reflux disease 07/13/2021   Overweight (BMI 25.0-29.9) 07/13/2021   PAD (peripheral artery disease) (HCC) 12/14/2020   Pelvic pain 12/14/2020   Lower abdominal pain 12/14/2020   Uveitis of right eye 09/21/2020   Status post total replacement of right hip 06/23/2020   Diabetic neuropathy (HCC) 04/13/2020   Acquired hypothyroidism 12/16/2019   Hyperlipidemia associated with type 2 diabetes mellitus (HCC) 12/16/2019    Hammer toes of both feet 12/16/2019   Degenerative arthritis of lumbar spine 12/16/2019   Dysuria 11/22/2019   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   Essential hypertension 02/19/2019   Postsurgical states following surgery of eye and adnexa 10/01/2018   Pseudophakia of both eyes 04/23/2018   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 30 tablet 0   atorvastatin (LIPITOR) 20 MG tablet Take 1 tablet (20 mg total) by mouth daily. 90 tablet 2   azaTHIOprine (IMURAN) 50 MG tablet Take by mouth.     brimonidine (ALPHAGAN) 0.2 % ophthalmic solution Place 1 drop into the right eye 2 (two) times daily.     calcitRIOL (ROCALTROL) 0.25 MCG capsule Take 2 capsules (0.5 mcg total) by mouth daily. 180 capsule 3   calcium carbonate (OS-CAL) 1250 (500 Ca) MG chewable tablet Chew 1 tablet (1,250 mg total) by mouth 3 (three) times daily. 270 tablet 3   Cholecalciferol (VITAMIN D3) 10 MCG (400 UNIT) tablet Take 1 tablet (400 Units total) by mouth daily. 90 tablet 3   clotrimazole (LOTRIMIN) 1 % cream APPLY TO BOTH FEET AT BASE OF TOES TWICE DAILY 30 g 1   Continuous Glucose Sensor (DEXCOM G7 SENSOR) MISC CHANGE/APPLY 1 SENSOR TO SKIN EVERY 10 DAYS 3 each 11   dorzolamide-timolol (COSOPT) 22.3-6.8 MG/ML ophthalmic solution Place 1 drop into both eyes 2 (two) times daily 40 mL 3   empagliflozin (JARDIANCE) 25 MG TABS tablet Take 1 tablet (25 mg total) by mouth daily before breakfast. 90 tablet 3   ferrous sulfate 325 (65 FE) MG  tablet Take by mouth.     levothyroxine (SYNTHROID) 88 MCG tablet Take 1 tablet (88 mcg total) by mouth daily. 90 tablet 3   metFORMIN (GLUCOPHAGE) 850 MG tablet Take 1 tablet (850 mg total) by mouth 2 (two) times daily with a meal. 180 tablet 3   repaglinide (PRANDIN) 0.5 MG tablet Take 1 tablet (0.5 mg total) by mouth daily before breakfast. 90 tablet 3   Travoprost, BAK Free, (TRAVATAN) 0.004 % SOLN ophthalmic solution Place 1 drop into both eyes nightly 2.5 mL 6   valACYclovir (VALTREX) 1000 MG tablet Take 1 tablet by mouth daily.     albuterol (VENTOLIN HFA) 108 (90 Base) MCG/ACT inhaler TAKE 2 PUFFS BY MOUTH EVERY 6 HOURS AS NEEDED FOR WHEEZE OR SHORTNESS OF BREATH 54 each 0   omeprazole (PRILOSEC) 40 MG capsule Take 1 capsule (40 mg total) by mouth daily. 90 capsule 0   No current facility-administered medications on  file prior to visit.    Allergies  Allergen Reactions   Clindamycin Rash   Penicillins Rash    Patient was placed on a form of penicillin by her dentist and developed a rash therefore medication was changed to clindamycin.  Per son, patient is not allergic to clindamycin  Patient was placed on a form of penicillin by her dentist and developed a rash therefore medication was changed to clindamycin.  Per son, patient is not allergic to clindamycin Patient was placed on a form of penicillin by her dentist and developed a rash therefore medication was changed to clindamycin.  Per son, patient is not allergic to clindamycin    Patient was placed on a form of penicillin by her dentist and developed a rash therefore medication was changed to clindamycin.  Per son, patient is not allergic to clindamycin    Social History   Socioeconomic History   Marital status: Widowed    Spouse name: Not on file   Number of children: Not on file   Years of education: Not on file   Highest education level: 4th grade  Occupational History   Not on file  Tobacco Use   Smoking status:  Never   Smokeless tobacco: Never  Vaping Use   Vaping status: Never Used  Substance and Sexual Activity   Alcohol use: No   Drug use: No   Sexual activity: Not Currently  Other Topics Concern   Not on file  Social History Narrative   Not on file   Social Drivers of Health   Financial Resource Strain: Low Risk  (03/20/2023)   Overall Financial Resource Strain (CARDIA)    Difficulty of Paying Living Expenses: Not hard at all  Food Insecurity: No Food Insecurity (03/20/2023)   Hunger Vital Sign    Worried About Running Out of Food in the Last Year: Never true    Ran Out of Food in the Last Year: Never true  Transportation Needs: No Transportation Needs (03/20/2023)   PRAPARE - Administrator, Civil Service (Medical): No    Lack of Transportation (Non-Medical): No  Physical Activity: Inactive (03/20/2023)   Exercise Vital Sign    Days of Exercise per Week: 0 days    Minutes of Exercise per Session: 0 min  Stress: No Stress Concern Present (03/20/2023)   [employer] of Occupational Health - Occupational Stress Questionnaire    Feeling of Stress : Not at all  Social Connections: Moderately Isolated (03/20/2023)   Social Connection and Isolation Panel [NHANES]    Frequency of Communication with Friends and Family: More than three times a week    Frequency of Social Gatherings with Friends and Family: Three times a week    Attends Religious Services: More than 4 times per year    Active Member of Clubs or Organizations: No    Attends Banker Meetings: Never    Marital Status: Widowed  Intimate Partner Violence: Not At Risk (03/20/2023)   Humiliation, Afraid, Rape, and Kick questionnaire    Fear of Current or Ex-Partner: No    Emotionally Abused: No    Physically Abused: No    Sexually Abused: No    Family History  Problem Relation Age of Onset   Diabetes Mother    Colon cancer Neg Hx    Stomach cancer Neg Hx    Esophageal cancer Neg Hx     Past  Surgical History:  Procedure Laterality Date   ABDOMINAL HYSTERECTOMY  GLAUCOMA SURGERY     THYROIDECTOMY     TOTAL HIP ARTHROPLASTY Right 06/23/2020   Procedure: RIGHT TOTAL HIP ARTHROPLASTY ANTERIOR APPROACH;  Surgeon: Kathryne Hitch, MD;  Location: [facility] OR;  Service: Orthopedics;  Laterality: Right;    ROS: Review of Systems Negative except as stated above  PHYSICAL EXAM: BP 121/74 (BP Location: Left Arm, Patient Position: Sitting, Cuff Size: Normal)   Pulse 70   Temp 97.9 F (36.6 C) (Oral)   Ht 5\' 4"  (1.626 m)   Wt 159 lb (72.1 kg)   SpO2 100%   BMI 27.29 kg/m   Wt Readings from Last 3 Encounters:  03/20/23 159 lb (72.1 kg)  12/26/22 167 lb (75.8 kg)  11/18/22 166 lb (75.3 kg)    Physical Exam  General appearance - alert, well appearing, elderly female and in no distress Mental status - normal mood, behavior, speech, dress, motor activity, and thought processes Mouth -oral mucosa is slightly dry. Neck - supple, no significant adenopathy Chest - clear to auscultation, no wheezes, rales or rhonchi, symmetric air entry Heart - normal rate, regular rhythm, normal S1, S2, no murmurs, rubs, clicks or gallops Abdomen -abdomen is nondistended, normal bowel sounds, she has mild tenderness in the subcutaneous fat on both sides of the abdomen below the lateral rib cage.  No tenderness on palpation in the 4 quadrants of the abdomen.  No abnormal masses or hepatomegaly appreciated.  Noted to have built-up dirt in the umbilicus.  Some of it was removed today. Musculoskeletal -patient has her cane with her. Extremities -no lower extremity edema. Diabetic Foot Exam - Simple   Simple Foot Form Diabetic Foot exam was performed with the following findings: Yes 03/20/2023  1:42 PM  Visual Inspection See comments: Yes Sensation Testing Intact to touch and monofilament testing bilaterally: Yes Pulse Check Posterior Tibialis and Dorsalis pulse intact bilaterally:  Yes Comments Mild disfigurement of the toes.         Latest Ref Rng & Units 12/26/2022    1:55 PM 06/14/2022    4:50 PM 12/14/2020   10:15 AM  CMP  Glucose 65 - 99 mg/dL 324  401  027   BUN 7 - 25 mg/dL 24  31  26    Creatinine 0.60 - 1.00 mg/dL 2.53  6.64  4.03   Sodium 135 - 146 mmol/L 139  137  137   Potassium 3.5 - 5.3 mmol/L 3.9  4.6  4.0   Chloride 98 - 110 mmol/L 104  100  100   CO2 20 - 32 mmol/L 28  22  23    Calcium 8.6 - 10.4 mg/dL 8.8  9.5  9.2   Total Protein 6.0 - 8.5 g/dL   7.4   Total Bilirubin 0.0 - 1.2 mg/dL   0.4   Alkaline Phos 44 - 121 IU/L   112   AST 0 - 40 IU/L   26   ALT 0 - 32 IU/L   20    Lipid Panel     Component Value Date/Time   CHOL 177 12/14/2020 1015   TRIG 88 12/14/2020 1015   HDL 84 12/14/2020 1015   CHOLHDL 2.1 12/14/2020 1015   LDLCALC 77 12/14/2020 1015    CBC    Component Value Date/Time   WBC 6.0 06/14/2022 1650   WBC 9.0 06/24/2020 0354   RBC 4.34 06/14/2022 1650   RBC 3.43 (L) 06/24/2020 0354   HGB 13.5 06/14/2022 1650   HCT 41.2 06/14/2022 1650  PLT 213 06/14/2022 1650   MCV 95 06/14/2022 1650   MCH 31.1 06/14/2022 1650   MCH 30.3 06/24/2020 0354   MCHC 32.8 06/14/2022 1650   MCHC 32.8 06/24/2020 0354   RDW 11.8 06/14/2022 1650   LYMPHSABS 1.6 06/14/2022 1650   MONOABS 0.5 05/01/2014 1948   EOSABS 0.1 06/14/2022 1650   BASOSABS 0.0 06/14/2022 1650    ASSESSMENT AND PLAN: 1. Type 2 diabetes mellitus with diabetic polyneuropathy, without long-term current use of insulin (HCC) (Primary) A1c not at goal but improved.  We will have her continue her current medications including metformin 850 mg twice a day, Jardiance 25 mg daily and Prandin 0.5 mg with breakfast.  Continue healthy eating habits. -Discussed the feeling that she get in her feet is most likely diabetic neuropathy.  Discussed putting her on low-dose of gabapentin or Lyrica but patient declines stating she does not want another medication at this time. -  POCT glycosylated hemoglobin (Hb A1C) - POCT glucose (manual entry) - glucose blood (TRUE METRIX BLOOD GLUCOSE TEST) test strip; Use as instructed  Dispense: 100 each; Refill: 12  2. Hyperlipidemia associated with type 2 diabetes mellitus (HCC) Continue atorvastatin 20 mg daily  3. History of hypertension Patient with history of hypertension in the past but has been off medication with good blood pressure reading on last visit with me in today.  She reports elevated blood pressure at home yesterday.  She has an automated arm device.  I went over with her and her daughter how to check the blood pressure at home -she should be seated with back supported, arm on an armrest, feet flat on the floor.  Advised that she can check her blood pressure twice a week with goal being 130/80 or lower.  Will have her follow-up with the clinical pharmacist in about 1 month for recheck.  She can bring blood pressure readings with her.  4. Stage 3a chronic kidney disease (HCC) Stable.  We will continue to monitor.  Not on NSAIDs. - Basic Metabolic Panel  5. Other fatigue Of questionable etiology.  We will check CBC and iron studies today.  Encouraged to get in at least about 7 hours of sleep at night.  6. Normocytic anemia - CBC - Iron, TIBC and Ferritin Panel  7. Post-operative hypothyroidism On levothyroxine.  Followed by endocrinology  8. Dysuria Patient reports this is better but persists.  We will recheck urinalysis with culture today - Urinalysis, Routine w reflex microscopic - Urine Culture  9. Pain of upper abdomen Abdominal exam proper is unrevealing.  Observe for now.  Advised to clean the umbilicus with warm water and a clean cloth at least once a week.  10. Encounter for screening mammogram for malignant neoplasm of breast - MM Digital Screening; Future   Patient was given the opportunity to ask questions.  Patient verbalized understanding of the plan and was able to repeat key elements  of the plan.   This documentation was completed using Paediatric nurse.  Any transcriptional errors are unintentional.  Orders Placed This Encounter  Procedures   Urine Culture   MM Digital Screening   Urinalysis, Routine w reflex microscopic   CBC   Iron, TIBC and Ferritin Panel   Basic Metabolic Panel   POCT glycosylated hemoglobin (Hb A1C)   POCT glucose (manual entry)     Requested Prescriptions   Signed Prescriptions Disp Refills   glucose blood (TRUE METRIX BLOOD GLUCOSE TEST) test strip 100 each 12  Sig: Use as instructed    Return in about 4 months (around 07/20/2023).  Jonah Blue, MD, FACP

## 2023-03-21 ENCOUNTER — Encounter: Payer: Self-pay | Admitting: Internal Medicine

## 2023-03-21 ENCOUNTER — Other Ambulatory Visit: Payer: Self-pay | Admitting: Internal Medicine

## 2023-03-21 DIAGNOSIS — E1142 Type 2 diabetes mellitus with diabetic polyneuropathy: Secondary | ICD-10-CM

## 2023-03-21 LAB — IRON,TIBC AND FERRITIN PANEL
Ferritin: 20 ng/mL (ref 15–150)
Iron Saturation: 10 % — ABNORMAL LOW (ref 15–55)
Iron: 40 ug/dL (ref 27–139)
Total Iron Binding Capacity: 416 ug/dL (ref 250–450)
UIBC: 376 ug/dL — ABNORMAL HIGH (ref 118–369)

## 2023-03-21 LAB — BASIC METABOLIC PANEL
BUN/Creatinine Ratio: 29 — ABNORMAL HIGH (ref 12–28)
BUN: 27 mg/dL (ref 8–27)
CO2: 23 mmol/L (ref 20–29)
Calcium: 8.8 mg/dL (ref 8.7–10.3)
Chloride: 98 mmol/L (ref 96–106)
Creatinine, Ser: 0.94 mg/dL (ref 0.57–1.00)
Glucose: 124 mg/dL — ABNORMAL HIGH (ref 70–99)
Potassium: 3.9 mmol/L (ref 3.5–5.2)
Sodium: 137 mmol/L (ref 134–144)
eGFR: 64 mL/min/{1.73_m2} (ref >59)

## 2023-03-21 LAB — URINALYSIS, ROUTINE W REFLEX MICROSCOPIC
Bilirubin, UA: NEGATIVE
Ketones, UA: NEGATIVE
Nitrite, UA: NEGATIVE
Protein,UA: NEGATIVE
Specific Gravity, UA: 1.025 (ref 1.005–1.030)
Urobilinogen, Ur: 0.2 mg/dL (ref 0.2–1.0)
pH, UA: 6.5 (ref 5.0–7.5)

## 2023-03-21 LAB — CBC
Hematocrit: 38.1 % (ref 34.0–46.6)
Hemoglobin: 12.4 g/dL (ref 11.1–15.9)
MCH: 28.5 pg (ref 26.6–33.0)
MCHC: 32.5 g/dL (ref 31.5–35.7)
MCV: 88 fL (ref 79–97)
Platelets: 282 10*3/uL (ref 150–450)
RBC: 4.35 x10E6/uL (ref 3.77–5.28)
RDW: 13.9 % (ref 11.7–15.4)
WBC: 5.4 10*3/uL (ref 3.4–10.8)

## 2023-03-21 LAB — MICROSCOPIC EXAMINATION
Bacteria, UA: NONE SEEN
Casts: NONE SEEN /LPF
RBC, Urine: NONE SEEN /HPF (ref 0–2)

## 2023-03-21 MED ORDER — TRUE METRIX BLOOD GLUCOSE TEST VI STRP
ORAL_STRIP | 12 refills | Status: AC
Start: 2023-03-21 — End: ?

## 2023-03-21 MED ORDER — CIPROFLOXACIN HCL 500 MG PO TABS: 500.0000 mg | ORAL_TABLET | Freq: Two times a day (BID) | ORAL | 0 refills | Status: AC

## 2023-03-21 MED ORDER — FERROUS SULFATE 325 (65 FE) MG PO TABS: ORAL_TABLET | ORAL | 0 refills | Status: DC

## 2023-03-22 ENCOUNTER — Other Ambulatory Visit: Payer: Self-pay

## 2023-03-22 ENCOUNTER — Encounter: Payer: Self-pay | Admitting: Internal Medicine

## 2023-03-22 LAB — URINE CULTURE

## 2023-04-12 LAB — HM DIABETES EYE EXAM

## 2023-04-25 ENCOUNTER — Encounter: Payer: Self-pay | Admitting: Pharmacist

## 2023-04-25 ENCOUNTER — Ambulatory Visit: Attending: Internal Medicine | Admitting: Pharmacist

## 2023-04-25 VITALS — BP 112/74 | HR 67

## 2023-04-25 DIAGNOSIS — Z79899 Other long term (current) drug therapy: Secondary | ICD-10-CM | POA: Diagnosis not present

## 2023-04-25 DIAGNOSIS — H3093 Unspecified chorioretinal inflammation, bilateral: Secondary | ICD-10-CM | POA: Insufficient documentation

## 2023-04-25 DIAGNOSIS — Z7984 Long term (current) use of oral hypoglycemic drugs: Secondary | ICD-10-CM

## 2023-04-25 DIAGNOSIS — E1151 Type 2 diabetes mellitus with diabetic peripheral angiopathy without gangrene: Secondary | ICD-10-CM | POA: Insufficient documentation

## 2023-04-25 DIAGNOSIS — I129 Hypertensive chronic kidney disease with stage 1 through stage 4 chronic kidney disease, or unspecified chronic kidney disease: Secondary | ICD-10-CM | POA: Insufficient documentation

## 2023-04-25 DIAGNOSIS — H409 Unspecified glaucoma: Secondary | ICD-10-CM | POA: Diagnosis not present

## 2023-04-25 DIAGNOSIS — E890 Postprocedural hypothyroidism: Secondary | ICD-10-CM | POA: Insufficient documentation

## 2023-04-25 DIAGNOSIS — E1122 Type 2 diabetes mellitus with diabetic chronic kidney disease: Secondary | ICD-10-CM | POA: Diagnosis present

## 2023-04-25 DIAGNOSIS — N1831 Chronic kidney disease, stage 3a: Secondary | ICD-10-CM | POA: Insufficient documentation

## 2023-04-25 DIAGNOSIS — E119 Type 2 diabetes mellitus without complications: Secondary | ICD-10-CM | POA: Diagnosis not present

## 2023-04-25 NOTE — Progress Notes (Signed)
 S:     No chief complaint on file.  73 y.o. female who presents for diabetes evaluation, education, and management. Patient arrives in good spirits and presents with her son.  Patient was referred and last seen by Primary Care Provider, Dr. Lincoln Renshaw, on 03/20/2023. A1c at that visit was 7.5%. BP was 121/74 mmHg. Dr. Lincoln Renshaw noted that the patient was reporting hypoglycemia during an Endocrinology visit in December, 2024. Glimepiride  was changed to reaglinide at that time. Dr. Lincoln Renshaw also noted, that while her clinic BP was okay, pt endorsed a home reading of 156/94 prior to seeing Dr. Lincoln Renshaw.   PMH is significant for DM type II, HTN, PAD, post surgical hypothyroidism, glaucoma, chorioretinal inflammation bilaterally, CKD 3a.   Today, pt brings in a home CBG log to review. She has no complaints and is accompanied by her son. Endorses adherence to Jardiance , metformin , and repaglinide  since seeing Dr. Lincoln Renshaw on 03/20/2023.   Regarding her HTN, she has no blood pressure values to report. However, she is concerned that her home blood pressure is a little high.   Family/Social History:  Fhx: DM Tobacco: never smoker  Alcohol: none reported   Current diabetes medications include: empagliflozin  25 mg daily, metformin  850 mg BID, repaglinide  0.5 mg daily (managed by Endocrine - Dr. Rosalea Collin) Current hypertension medications include: none Current hyperlipidemia medications include: atorvastatin  20 mg daily   Patient reports adherence to taking all medications as prescribed.   Insurance coverage: Medicare, Medicaid  Patient denies hypoglycemic events.  Reported home fasting blood sugars: brings range for review. Range from 2/12 - 03/15/23 is as follows: 65 - 107. Reported 2 hour post-meal/random blood sugars: brings range for review. Range from 2/12 - 03/15/23 is as follows: 108 - 180  Patient denies nocturia (nighttime urination).  Patient reports neuropathy (nerve pain). Patient  reports visual changes. Patient reports self foot exams.    O:   Lab Results  Component Value Date   HGBA1C 7.5 (A) 03/20/2023   There were no vitals filed for this visit.  Lipid Panel     Component Value Date/Time   CHOL 177 12/14/2020 1015   TRIG 88 12/14/2020 1015   HDL 84 12/14/2020 1015   CHOLHDL 2.1 12/14/2020 1015   LDLCALC 77 12/14/2020 1015    Clinical Atherosclerotic Cardiovascular Disease (ASCVD): Yes  - No known hx of cardio-specific ASCVD, but does have known PAD.  Patient is participating in a Managed Medicaid Plan: No   A/P: Diabetes longstanding currently pretty well-controlled given her age. A1c 03/20/23 was 7.5 and she is managed primarily by her Endocrinologist for DM. She is not currently hypoglycemic but is able to verbalize appropriate hypoglycemia management plan. Medication adherence appears to be appropriate. Reported sugars today are at goal.  - Recommend continued follow-up with Endocrine. - No medication changes at this time.  -Extensively discussed pathophysiology of diabetes, recommended lifestyle interventions, dietary effects on blood sugar control.  -Counseled on s/sx of and management of hypoglycemia.  -Next A1c anticipated 06/2023.   ASCVD risk - secondary prevention in patient with diabetes. Last LDL is 77, not at goal but close. She is currently on atorvastatin  20 mg daily. Can consider high intensity statin therapy in the future, but will have to be careful. Pt is moderate fall risk.  -Continued atorvastatin  20 mg for now.   Hypertension longstanding currently off medications. Blood pressure goal of <130/80 mmHg. No home readings - clinic BP was good today and previously with PCP (03/20/2023). -  No medication changes today.    Written patient instructions provided. Patient verbalized understanding of treatment plan.  Total time in face to face counseling 30 minutes.    Follow-up:  Pharmacist prn PCP clinic visit 07/20/23. Endocrine  06/28/2023.  Marene Shape, PharmD, Becky Bowels, CPP Clinical Pharmacist [facility] [phone]

## 2023-04-26 ENCOUNTER — Ambulatory Visit

## 2023-06-10 ENCOUNTER — Encounter (HOSPITAL_COMMUNITY): Payer: Self-pay | Admitting: *Deleted

## 2023-06-10 ENCOUNTER — Other Ambulatory Visit: Payer: Self-pay

## 2023-06-10 ENCOUNTER — Emergency Department (HOSPITAL_COMMUNITY)
Admission: EM | Admit: 2023-06-10 | Discharge: 2023-06-10 | Discharge status: Against Medical Advice | Attending: Emergency Medicine | Admitting: Emergency Medicine

## 2023-06-10 DIAGNOSIS — Z5321 Procedure and treatment not carried out due to patient leaving prior to being seen by health care provider: Secondary | ICD-10-CM | POA: Insufficient documentation

## 2023-06-10 DIAGNOSIS — R21 Rash and other nonspecific skin eruption: Secondary | ICD-10-CM | POA: Diagnosis present

## 2023-06-10 DIAGNOSIS — T7840XA Allergy, unspecified, initial encounter: Secondary | ICD-10-CM | POA: Insufficient documentation

## 2023-06-10 MED ORDER — DIPHENHYDRAMINE HCL 25 MG PO CAPS
50.0000 mg | ORAL_CAPSULE | Freq: Once | ORAL | Status: AC
  Administered 2023-06-10: 50 mg via ORAL
  Filled 2023-06-10: qty 2

## 2023-06-10 NOTE — ED Notes (Signed)
 Pt visitor stated she decided to leave AMA, stating the pt feels better now

## 2023-06-10 NOTE — ED Triage Notes (Signed)
 The pt has had a rash since 2100 she is still itching I dont see any rash or hives no resp difficulty

## 2023-06-17 ENCOUNTER — Other Ambulatory Visit: Payer: Self-pay | Admitting: Internal Medicine

## 2023-06-19 NOTE — Telephone Encounter (Signed)
 Requested Prescriptions  Pending Prescriptions Disp Refills   ferrous sulfate  325 (65 FE) MG tablet [Pharmacy Med Name: FERROUS SULFATE  325 MG TABLET] 90 tablet 0    Sig: 1 TAB DAILY X 1 MONTH, THEN 1 TAB ON M/W/F     Endocrinology:  Minerals - Iron Supplementation Passed - 06/19/2023  8:48 AM      Passed - HGB in normal range and within 360 days    Hemoglobin  Date Value Ref Range Status  03/20/2023 12.4 11.1 - 15.9 g/dL Final         Passed - HCT in normal range and within 360 days    Hematocrit  Date Value Ref Range Status  03/20/2023 38.1 34.0 - 46.6 % Final         Passed - RBC in normal range and within 360 days    RBC  Date Value Ref Range Status  03/20/2023 4.35 3.77 - 5.28 x10E6/uL Final  06/24/2020 3.43 (L) 3.87 - 5.11 MIL/uL Final         Passed - Fe (serum) in normal range and within 360 days    Iron  Date Value Ref Range Status  03/20/2023 40 27 - 139 ug/dL Final   Iron Saturation  Date Value Ref Range Status  03/20/2023 10 (L) 15 - 55 % Final         Passed - Ferritin in normal range and within 360 days    Ferritin  Date Value Ref Range Status  03/20/2023 20 15 - 150 ng/mL Final         Passed - Valid encounter within last 12 months    Recent Outpatient Visits           1 month ago Type 2 diabetes mellitus without complication, without long-term current use of insulin (HCC)   [facility] Dept Of [place]. [facility] Freada Jacobs, [place] L, RPH-CPP   3 months ago Type 2 diabetes mellitus with diabetic polyneuropathy, without long-term current use of insulin (HCC)   [facility] - A Dept Of [place]. [facility] Lawrance Presume, MD   7 months ago Annual physical exam   [facility] - A Dept Of [place]. [facility] Lawrance Presume, MD   1 year ago Chronic cough    [facility] - A Dept Of [place]. [facility] Viking, [place] W,  NP   2 years ago Type 2 diabetes mellitus without complication, without long-term current use of insulin (HCC)    [facility] - A Dept Of [place]. [facility] Vernell Goldsmith, MD       Future Appointments             In 1 month Lincoln Renshaw Rexine Cater, MD [facility] - A Dept Of [place]. [facility]

## 2023-06-27 DIAGNOSIS — H04123 Dry eye syndrome of bilateral lacrimal glands: Secondary | ICD-10-CM | POA: Diagnosis not present

## 2023-06-27 DIAGNOSIS — H16223 Keratoconjunctivitis sicca, not specified as Sjogren's, bilateral: Secondary | ICD-10-CM | POA: Diagnosis not present

## 2023-06-28 ENCOUNTER — Ambulatory Visit: Payer: Medicare HMO | Admitting: Internal Medicine

## 2023-06-28 ENCOUNTER — Encounter: Payer: Self-pay | Admitting: Internal Medicine

## 2023-06-28 VITALS — BP 118/76 | HR 74 | Ht 65.0 in | Wt 154.0 lb

## 2023-06-28 DIAGNOSIS — Z7984 Long term (current) use of oral hypoglycemic drugs: Secondary | ICD-10-CM | POA: Diagnosis not present

## 2023-06-28 DIAGNOSIS — E042 Nontoxic multinodular goiter: Secondary | ICD-10-CM | POA: Diagnosis not present

## 2023-06-28 DIAGNOSIS — E119 Type 2 diabetes mellitus without complications: Secondary | ICD-10-CM

## 2023-06-28 DIAGNOSIS — E892 Postprocedural hypoparathyroidism: Secondary | ICD-10-CM | POA: Diagnosis not present

## 2023-06-28 DIAGNOSIS — E890 Postprocedural hypothyroidism: Secondary | ICD-10-CM | POA: Diagnosis not present

## 2023-06-28 LAB — POCT GLYCOSYLATED HEMOGLOBIN (HGB A1C): Hemoglobin A1C: 7.4 % — AB (ref 4.0–5.6)

## 2023-06-28 NOTE — Progress Notes (Signed)
 Name: Norma Clay  MRN/ DOB: [id number], 08-20-1950    Age/ Sex: 73 y.o., female     PCP: Vicci Barnie NOVAK, MD   Reason for Endocrinology Evaluation: Post-surgical hypoparathyroidism     Initial Endocrinology Clinic Visit: 12/08/17    PATIENT IDENTIFIER: Norma Clay is a 73 y.o., female with a past medical history of Total thyroidectomy and T2DM. She has followed with [facility] since 12/08/2017 for consultative assistance with management of her Post-surgical hypoparathyroidism.   HISTORICAL SUMMARY:  She  moved from [place] in 2019. 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 She denies history of thyroid  cancer. Pt noted hand spasms following 2nd surgery and has been on Calcium  and Calcitriol  since. Historically has compliance issues which results in serum calcium  fluctuations.  24-hr urine collection normal 06/2018  She is s/p right thyroid  nodule FNA while in [place]  with benign cytology per patient 08/2021   DIABETES HISTORY: She has been diagnosed with diabetes many years ago.  She has been on metformin , she used to be on glimepiride , Jardiance  was started in 2022. Her A1c has ranged from 6.9% in 2020, peaking at 10.0% in 2018  Due to hypoglycemia I have switched glimepiride  to repaglinide  12/2022  SUBJECTIVE:   Today (06/28/2023):  [name] is here for a follow up on hypocalcemia, DM and hypothyroidism.  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     Continues with occasional local neck pain  Continue with occasional constipation No palpitations  Doesn't exercise     Medication: Calcium  carbonate (oyster shell) 500 mg, 1 tabs  TID Calcitriol  0.25 MCG, 2 caps daily Vitamin D3 400 IU daily Levothyroxine  88 mcg daily  Jardiance  25 mg daily Metformin  850 twice daily Repaglinide  0.5 mg, 1 tab before Breakfast    GLUCOSE LOG 84-180   DIABETIC COMPLICATIONS: Microvascular complications:   Denies:  Last Eye Exam: Completed 04/12/2023  Macrovascular complications:   Denies: CAD, CVA, PVD    HISTORY:  Past Medical History:  Past Medical History:  Diagnosis Date   Diabetes mellitus without complication (HCC)    Glaucoma    Hypertension    Osteoarthritis of right hip 05/16/2016   Posterior capsular opacification of both eyes, 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   Primary osteoarthritis of right hip 05/06/2020   Past Surgical History:  Past Surgical History:  Procedure Laterality Date   ABDOMINAL HYSTERECTOMY     GLAUCOMA SURGERY     THYROIDECTOMY     TOTAL HIP ARTHROPLASTY Right 06/23/2020   Procedure: RIGHT TOTAL HIP ARTHROPLASTY ANTERIOR APPROACH;  Surgeon: Vernetta Lonni GRADE, MD;  Location: [facility] OR;  Service: Orthopedics;  Laterality: Right;   Social History:  reports that she has never smoked. She has never used smokeless tobacco. She reports that she does not drink alcohol and does not use drugs. Family History: family history includes Diabetes in her mother.   HOME MEDICATIONS: Allergies as of 06/28/2023       Reactions   Clindamycin  Rash   Penicillins Rash   Patient was placed on a form of penicillin by her dentist and developed a rash therefore medication was changed to clindamycin .  Per son, patient  is not allergic to clindamycin  Patient was placed on a form of penicillin by her dentist and developed a rash therefore medication was changed to clindamycin .  Per son, patient is not allergic to clindamycin  Patient was placed on a form of penicillin by her dentist and developed a rash therefore medication was changed to clindamycin .  Per son, patient is not allergic to clindamycin     Patient was placed  on a form of penicillin by her dentist and developed a rash therefore medication was changed to clindamycin .  Per son, patient is not allergic to clindamycin         Medication List        Accurate as of June 28, 2023  7:01 AM. If you have any questions, ask your nurse or doctor.          acetaminophen  325 MG tablet Commonly known as: Tylenol  Take 1-2 tablets (325-650 mg total) by mouth every 6 (six) hours as needed for moderate pain.   albuterol  108 (90 Base) MCG/ACT inhaler Commonly known as: VENTOLIN  HFA TAKE 2 PUFFS BY MOUTH EVERY 6 HOURS AS NEEDED FOR WHEEZE OR SHORTNESS OF BREATH   atorvastatin  20 MG tablet Commonly known as: LIPITOR Take 1 tablet (20 mg total) by mouth daily.   azaTHIOprine 50 MG tablet Commonly known as: IMURAN Take by mouth.   brimonidine  0.2 % ophthalmic solution Commonly known as: ALPHAGAN  Place 1 drop into the right eye 2 (two) times daily.   calcitRIOL  0.25 MCG capsule Commonly known as: ROCALTROL  Take 2 capsules (0.5 mcg total) by mouth daily.   calcium  carbonate 1250 (500 Ca) MG chewable tablet Commonly known as: OS-CAL Chew 1 tablet (1,250 mg total) by mouth 3 (three) times daily.   clotrimazole  1 % cream Commonly known as: LOTRIMIN  APPLY TO BOTH FEET AT BASE OF TOES TWICE DAILY   Dexcom G7 Sensor Misc CHANGE/APPLY 1 SENSOR TO SKIN EVERY 10 DAYS   dorzolamide -timolol  2-0.5 % ophthalmic solution Commonly known as: COSOPT  Place 1 drop into both eyes 2 (two) times daily   empagliflozin  25 MG Tabs tablet Commonly known as: Jardiance  Take 1 tablet (25 mg total) by mouth daily before breakfast.   ferrous sulfate  325 (65 FE) MG tablet Take by mouth.   ferrous sulfate  325 (65 FE) MG tablet 1 TAB DAILY X 1 MONTH, THEN 1 TAB ON M/W/F   levothyroxine  88 MCG tablet Commonly known as: SYNTHROID  Take 1 tablet (88 mcg total) by mouth daily.   metFORMIN  850 MG tablet Commonly known as: GLUCOPHAGE  Take 1 tablet (850 mg total) by  mouth 2 (two) times daily with a meal.   omeprazole  40 MG capsule Commonly known as: PRILOSEC Take 1 capsule (40 mg total) by mouth daily.   repaglinide  0.5 MG tablet Commonly known as: PRANDIN  Take 1 tablet (0.5 mg total) by mouth daily before breakfast.   Travatan  Z 0.004 % Soln ophthalmic solution Generic drug: Travoprost  (BAK Free) Place 1 drop into both eyes nightly   True Metrix Blood Glucose Test test strip Generic drug: glucose blood Use as instructed daily E11.42   valACYclovir 1000 MG tablet Commonly known as: VALTREX Take 1 tablet by mouth daily.   Vitamin D3 10 MCG (400 UNIT) tablet Take 1 tablet (400 Units total) by mouth daily.          OBJECTIVE:   PHYSICAL EXAM: VS:BP 118/76 (BP Location: Left Arm, Patient Position: Sitting, Cuff Size: Normal)   Pulse 74   Ht 5' 5 (1.651 m)  Wt 154 lb (69.9 kg)   SpO2 98%   BMI 25.63 kg/m    EXAM: General: Pt appears well and is in NAD No thyroid  nodules appreciated  Lungs: Clear with good BS bilat   Heart: Auscultation: RRR.  Extremities: No pretibial edema   Mental Status: Judgment, insight: Intact Mood and affect: No depression, anxiety, or agitation   Dm Foot Exam 06/28/2023   The skin of the feet is intact without sores or ulcerations. The pedal pulses are 1+ on right and 1+ on left. The sensation is decreased to a screening 5.07, 10 gram monofilament bilaterally   DATA REVIEWED:   Latest Reference Range & Units 06/28/23 12:06  Sodium 135 - 146 mmol/L 140  Potassium 3.5 - 5.3 mmol/L 3.9  Chloride 98 - 110 mmol/L 103  CO2 20 - 32 mmol/L 28  Glucose 65 - 99 mg/dL 867 (H)  BUN 7 - 25 mg/dL 32 (H)  Creatinine 9.39 - 1.00 mg/dL 9.13  Calcium  8.6 - 10.4 mg/dL 8.5 (L)  BUN/Creatinine Ratio 6 - 22 (calc) 37 (H)  eGFR > OR = 60 mL/min/1.74m2 71    Latest Reference Range & Units 06/28/23 12:06  Vitamin D , 25-Hydroxy 30 - 100 ng/mL 47    Latest Reference Range & Units 06/28/23 12:06  TSH 0.40 -  4.50 mIU/L 0.77      Thyroid  ultrasound 08/03/2021 @[facility]  There is a 2 cm TR 4 nodule in the midpole region right lobe.  Recommend FNA There is 11 mm TR 3 nodule in the thyroid  isthmus Left lobe absent    Thyroid  ULtrasound 06/24/2022 FINDINGS: Isthmus: Redemonstrated nodular tissue within isthmic resection bed measuring 1.8 x 1.1 x 0.5 cm, previously, 2.2 x 1.3 x 0.3 cm when compared to the 12/2019 examination.   Right lobe: Redemonstrated nodular tissue within the right lobectomy resection bed measuring 2.0 x 1.6 x 1.4 cm, previously, 2.2 x 1.4 x 1.3 cm, when compared to the 12/2019 examination   Left lobe: No definitive residual soft tissue is seen within the left lobectomy resection bed.   _________________________________________________________   No regional cervical lymphadenopathy.   IMPRESSION: Stable sequela of presumed subtotal thyroidectomy with residual nodular tissue within the isthmic and right lobe resection beds, grossly unchanged compared to the 12/2019 examination and without definitive evidence of progressive residual or recurrent disease.       Old records , labs and images have been reviewed.    ASSESSMENT / PLAN / RECOMMENDATIONS:   Surgical Hypoparathyroidism :    - Calcium  level , vitamin D  and GFR remain within acceptable range -24-hour urine collection was acceptable at 213 on 06/03/2020, this was 166 in 2020 - I did entertain Yorvipath  , but patient is skeptical at this time - I did explain to the patient that this is once daily injection that would replace multiple doses of calcium  and calcitriol , but she would most likely remain on vitamin D3  Medications:  Continue Calcium  Carbonate 500 mg 1 tabs TID Continue  Calcitriol  0.25 mcg ( 2 Caps ) daily   Continue Vitamin D3 400 iu daily     2. Postsurgical hypothyroidism :  - Clinically euthyroid  - TSH remains within normal range  Continue levothyroxine  88  mcg daily     3. Multinodular Goiter:  -S/p left thyroidectomy while living in [place] due to benign pathology -Thyroid  ultrasound 12/17/2020 showed isthmic and a right thyroid  nodule meeting criteria for 1 year follow-up -She had  a repeat thyroid  ultrasound 08/2021 while living in [place] , she is s/p benign FNA of the right nodule 08/2021 per patient -Repeat thyroid  ultrasound shows stability from 2021   4. T2DM, Sub-optimally Controlled , A1c 7.4 %  -A1c is trending down but continues to be above goal - I have recommended taking repaglinide  before breakfast, as it appears that the patient is twice in the morning and she takes repaglinide  before her second meal rather than the first meal -Will continue metformin  and Jardiance  at this time    medication Take repaglinide  0.5 mg, 1 tablet before breakfast Continue metformin  850 mg twice daily Continue  Jardiance  25 mg daily   F/u in 6 months      Signed electronically by: Stefano Redgie Butts, MD  [facility]  [facility] [address] [place] [place], [place] [address] Phone: [phone] FAX: [phone]      CC: Vicci Barnie NOVAK, MD [address] [place] [address] Phone: [phone]  Fax: [phone]   Return to Endocrinology clinic as below: Future Appointments  Date Time Provider Department Center  06/28/2023 11:10 AM Nickalus Thornsberry, Donell Redgie, MD [facility] None  07/20/2023 11:10 AM Vicci Barnie NOVAK, MD [facility] None  10/24/2023  9:10 AM [facility] ANNUAL WELLNESS VISIT [facility] None

## 2023-06-28 NOTE — Patient Instructions (Addendum)
 Take Repaglinide  0.5 mg , 1 tablet BEFORE Breakfast daily  Take  Metformin  850 mg  1 tablet before Breakfast and 1 tablet before Supper  Take Jardiance  25 mg, 1 tablet before breakfast     Continue Calcium  Carbonate  1 tablet three times daily for now  Continue Calcitriol  two capsules a day  Continue Over the counter Vitamin D3 400 iu daily  Continue Levothyroxine  88 mcg, 1 tablet before Breakfast    ????? ?????????? ?.? ???? ??? ???? ??? ??????? ??????. ????? ????????? ??? ???? ??? ???? ??? ??????? ???? ???? ??? ??????. ????? ???????? ?? ???? ??? ???? ??? ???????.  ????? ?? ????? ??????? ?????????? ??? ???? ???? ???? ?????? ?? ????? ??????. ????? ?? ????? ??????????? ???????? ??????. ????? ?? ????? ??????? ?? ??? ???? ????? ??????? ???? ???? ????. ????? ?? ????? ???????????? ?? ?????????? ??? ???? ??? ???????.     Check out the Yorvipath  , once daily injection to replace calcium  and calcitriol 

## 2023-06-29 LAB — TSH: TSH: 0.77 m[IU]/L (ref 0.40–4.50)

## 2023-06-29 LAB — BASIC METABOLIC PANEL WITH GFR
BUN/Creatinine Ratio: 37 (calc) — ABNORMAL HIGH (ref 6–22)
BUN: 32 mg/dL — ABNORMAL HIGH (ref 7–25)
CO2: 28 mmol/L (ref 20–32)
Calcium: 8.5 mg/dL — ABNORMAL LOW (ref 8.6–10.4)
Chloride: 103 mmol/L (ref 98–110)
Creat: 0.86 mg/dL (ref 0.60–1.00)
Glucose, Bld: 132 mg/dL — ABNORMAL HIGH (ref 65–99)
Potassium: 3.9 mmol/L (ref 3.5–5.3)
Sodium: 140 mmol/L (ref 135–146)
eGFR: 71 mL/min/{1.73_m2} (ref >=60)

## 2023-06-29 LAB — ALBUMIN: Albumin: 3.6 g/dL (ref 3.6–5.1)

## 2023-06-29 LAB — VITAMIN D 25 HYDROXY (VIT D DEFICIENCY, FRACTURES): Vit D, 25-Hydroxy: 47 ng/mL (ref 30–100)

## 2023-06-30 ENCOUNTER — Ambulatory Visit: Payer: Self-pay | Admitting: Internal Medicine

## 2023-07-03 MED ORDER — CALCITRIOL 0.25 MCG PO CAPS: 0.5000 ug | ORAL_CAPSULE | Freq: Every day | ORAL | 3 refills | Status: AC

## 2023-07-03 MED ORDER — LEVOTHYROXINE SODIUM 88 MCG PO TABS: 88.0000 ug | ORAL_TABLET | Freq: Every day | ORAL | 3 refills | Status: DC

## 2023-07-20 ENCOUNTER — Ambulatory Visit: Attending: Internal Medicine | Admitting: Internal Medicine

## 2023-07-20 ENCOUNTER — Encounter: Payer: Self-pay | Admitting: Internal Medicine

## 2023-07-20 VITALS — BP 115/60 | HR 74 | Temp 97.7°F | Ht 65.0 in | Wt 157.0 lb

## 2023-07-20 DIAGNOSIS — Z9181 History of falling: Secondary | ICD-10-CM

## 2023-07-20 DIAGNOSIS — M791 Myalgia, unspecified site: Secondary | ICD-10-CM | POA: Diagnosis not present

## 2023-07-20 DIAGNOSIS — E785 Hyperlipidemia, unspecified: Secondary | ICD-10-CM

## 2023-07-20 DIAGNOSIS — D508 Other iron deficiency anemias: Secondary | ICD-10-CM | POA: Diagnosis not present

## 2023-07-20 DIAGNOSIS — R269 Unspecified abnormalities of gait and mobility: Secondary | ICD-10-CM

## 2023-07-20 DIAGNOSIS — E119 Type 2 diabetes mellitus without complications: Secondary | ICD-10-CM

## 2023-07-20 DIAGNOSIS — Z7984 Long term (current) use of oral hypoglycemic drugs: Secondary | ICD-10-CM | POA: Diagnosis not present

## 2023-07-20 DIAGNOSIS — R002 Palpitations: Secondary | ICD-10-CM

## 2023-07-20 DIAGNOSIS — E1169 Type 2 diabetes mellitus with other specified complication: Secondary | ICD-10-CM

## 2023-07-20 NOTE — Progress Notes (Signed)
 Patient ID: Norma Clay, female    DOB: 1950-04-30  MRN: [id number]  CC: Diabetes (DM f/u. Suellen more frequent falls - bilateral leg pain X1 mo /Reports heart palpitations at night X1 mo/Experiencing sugar cravings )   Subjective: Norma Clay is a 73 y.o. female who presents for chronic ds management. Daughter, Norma Clay, is with her.  Wavier signed from previous visit.  Daughter interprets.  Her concerns today include:  Patient with history of DM type II, HTN, PAD, post surgical hypothyroidism, glaucoma, chorioretinal inflammation bilaterally, CKD 3a   Discussed the use of AI scribe software for clinical note transcription with the patient, who gave verbal consent to proceed.  History of Present Illness Norma Clay is a 73 year old female with diabetes and hypothyroidism who presents with recent falls and palpitations.  She has experienced two falls which last visit 4 mths ago, with the most recent occurring last month due to a power outage that led to poor visibility. She attempted to reach for a chair but missed, resulting in a fall onto her buttock without sustaining any injuries. Similar falls have occurred in the past, with the last one a couple of months ago when reaching for something. She describes knee pain associated with these falls, noting it is intermittent and more frequent when climbing stairs. No knee swelling is present, but there is a sensation of dryness in the area.  Also reports some pain in the muscles in both calf since falls.  She ambulates with a cane.  Palpitations have been occurring for over a month, primarily at night when she wakes up to use the bathroom.  She is unsure how long the last but sounds like seconds to minutes  after which she feels okay. She has a history of low functioning thyroid  and is on thyroid  medication, with recent thyroid  levels in the low normal range. She also has a history of iron deficiency and was prescribed iron supplements, which  she took inconsistently, stopping about a month ago.  H&H on last visit was 12.4/38.  Ferritin level was 20 with 10% sat and iron level of 40.  DM: Her diabetes is managed with metformin , Jardiance , and Prandin , with no recent changes to her medication regimen. Her last A1c was 7.4 when she saw the endocrinologist Dr. Kellie about 3-1/2 weeks ago.  She reports craving sugar/sweets.    She is also on atorvastatin  20 mg daily for cholesterol management.   CKD: her kidney function has been monitored, with GFR values in the fifties last year, 48 in March, and 71 recently.   Patient Active Problem List   Diagnosis Date Noted   Stage 3a chronic kidney disease (HCC) 03/20/2023   Postsurgical hypothyroidism 12/26/2022   Mixed hyperlipidemia 08/04/2021   Gastroesophageal reflux disease 07/13/2021   Overweight (BMI 25.0-29.9) 07/13/2021   PAD (peripheral artery disease) (HCC) 12/14/2020   Pelvic pain 12/14/2020   Lower abdominal pain 12/14/2020   Uveitis of right eye 09/21/2020   Status post total replacement of right hip 06/23/2020   Diabetic neuropathy (HCC) 04/13/2020   Acquired hypothyroidism 12/16/2019   Hyperlipidemia associated with type 2 diabetes mellitus (HCC) 12/16/2019   Hammer toes of both feet 12/16/2019   Degenerative arthritis of lumbar spine 12/16/2019   Dysuria 11/22/2019   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   Essential hypertension 02/19/2019   Postsurgical states following surgery of eye and adnexa 10/01/2018   Pseudophakia of both eyes 04/23/2018   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 30 tablet 0   atorvastatin  (LIPITOR) 20 MG tablet Take 1 tablet (20 mg total) by mouth daily. 90 tablet 2   azaTHIOprine (IMURAN) 50 MG tablet Take by mouth.     calcitRIOL  (ROCALTROL ) 0.25 MCG capsule Take 2 capsules (0.5 mcg total) by mouth daily. 180 capsule 3   calcium  carbonate (OS-CAL) 1250 (500 Ca) MG chewable tablet Chew 1 tablet (1,250 mg total) by mouth 3 (three) times daily. 270 tablet 3   Cholecalciferol  (VITAMIN D3) 10 MCG (400 UNIT) tablet Take 1 tablet (400 Units total) by mouth daily. 90 tablet 3   clotrimazole  (LOTRIMIN ) 1 % cream APPLY TO BOTH FEET AT BASE OF TOES TWICE DAILY 30 g 1   Continuous Glucose Sensor (DEXCOM G7 SENSOR) MISC CHANGE/APPLY 1 SENSOR TO SKIN EVERY 10 DAYS 3 each 11   dorzolamide -timolol  (COSOPT ) 22.3-6.8 MG/ML ophthalmic solution Place 1 drop into both eyes 2 (two) times daily 40 mL 3   empagliflozin  (JARDIANCE ) 25 MG TABS tablet Take 1 tablet (25 mg total) by mouth daily before breakfast. 90 tablet 3   ferrous sulfate  325 (65 FE) MG tablet Take by mouth.     ferrous sulfate  325 (65 FE) MG tablet 1 TAB DAILY X 1 MONTH, THEN 1 TAB ON M/W/F 90 tablet 0   glucose blood (TRUE METRIX BLOOD GLUCOSE TEST) test strip Use as instructed daily E11.42 100 each 12   levothyroxine  (SYNTHROID ) 88 MCG tablet Take 1 tablet (88 mcg total) by mouth daily. 90 tablet 3   metFORMIN  (GLUCOPHAGE ) 850 MG tablet Take 1 tablet (850 mg total) by mouth 2 (two) times daily with a meal. 180 tablet 3   repaglinide  (PRANDIN ) 0.5 MG tablet Take 1 tablet (0.5 mg total) by mouth daily before breakfast. 90 tablet 3   Travoprost , BAK Free, (TRAVATAN ) 0.004 % SOLN ophthalmic solution Place 1 drop into both eyes nightly 2.5 mL 6   valACYclovir (VALTREX) 1000 MG tablet Take 1 tablet by mouth daily.     albuterol  (VENTOLIN  HFA) 108 (90 Base) MCG/ACT inhaler TAKE 2 PUFFS BY MOUTH EVERY  6 HOURS AS NEEDED FOR WHEEZE OR SHORTNESS OF BREATH (Patient not taking: Reported on 07/20/2023) 54 each 0   brimonidine  (ALPHAGAN ) 0.2 % ophthalmic solution Place 1 drop into the right eye 2 (two) times daily. (Patient not taking: Reported on 07/20/2023)     omeprazole  (PRILOSEC) 40 MG capsule Take 1 capsule (40 mg total) by mouth daily. (Patient not taking: Reported on 07/20/2023) 90 capsule 0   No current facility-administered medications on file prior to visit.    Allergies  Allergen Reactions   Clindamycin  Rash   Penicillins Rash    Patient was placed on a form of penicillin by her dentist and developed a rash therefore medication was changed to clindamycin .  Per son, patient is not allergic to clindamycin   Patient was placed on a form of penicillin by her dentist and developed a rash therefore medication was changed to clindamycin .  Per son, patient is not allergic to clindamycin  Patient  was placed on a form of penicillin by her dentist and developed a rash therefore medication was changed to clindamycin .  Per son, patient is not allergic to clindamycin     Patient was placed on a form of penicillin by her dentist and developed a rash therefore medication was changed to clindamycin .  Per son, patient is not allergic to clindamycin     Social History   Socioeconomic History   Marital status: Widowed    Spouse name: Not on file   Number of children: Not on file   Years of education: Not on file   Highest education level: 4th grade  Occupational History   Not on file  Tobacco Use   Smoking status: Never   Smokeless tobacco: Never  Vaping Use   Vaping status: Never Used  Substance and Sexual Activity   Alcohol use: No   Drug use: No   Sexual activity: Not Currently  Other Topics Concern   Not on file  Social History Narrative   Not on file   Social Drivers of Health   Financial Resource Strain: Low Risk  (07/19/2023)   Overall Financial Resource Strain (CARDIA)    Difficulty  of Paying Living Expenses: Not very hard  Food Insecurity: No Food Insecurity (07/19/2023)   Hunger Vital Sign    Worried About Running Out of Food in the Last Year: Never true    Ran Out of Food in the Last Year: Never true  Transportation Needs: No Transportation Needs (07/19/2023)   PRAPARE - Administrator, Civil Service (Medical): No    Lack of Transportation (Non-Medical): No  Physical Activity: Insufficiently Active (07/19/2023)   Exercise Vital Sign    Days of Exercise per Week: 1 day    Minutes of Exercise per Session: 10 min  Stress: No Stress Concern Present (07/19/2023)   [employer] of Occupational Health - Occupational Stress Questionnaire    Feeling of Stress: Not at all  Social Connections: Moderately Isolated (07/19/2023)   Social Connection and Isolation Panel    Frequency of Communication with Friends and Family: Three times a week    Frequency of Social Gatherings with Friends and Family: Once a week    Attends Religious Services: 1 to 4 times per year    Active Member of [employer] or Organizations: No    Attends Banker Meetings: Not on file    Marital Status: Widowed  Intimate Partner Violence: Not At Risk (03/20/2023)   Humiliation, Afraid, Rape, and Kick questionnaire    Fear of Current or Ex-Partner: No    Emotionally Abused: No    Physically Abused: No    Sexually Abused: No    Family History  Problem Relation Age of Onset   Diabetes Mother    Colon cancer Neg Hx    Stomach cancer Neg Hx    Esophageal cancer Neg Hx     Past Surgical History:  Procedure Laterality Date   ABDOMINAL HYSTERECTOMY     GLAUCOMA SURGERY     THYROIDECTOMY     TOTAL HIP ARTHROPLASTY Right 06/23/2020   Procedure: RIGHT TOTAL HIP ARTHROPLASTY ANTERIOR APPROACH;  Surgeon: Vernetta Lonni GRADE, MD;  Location: [facility] OR;  Service: Orthopedics;  Laterality: Right;    ROS: Review of Systems Negative except as stated above  PHYSICAL EXAM: BP 95/65 (BP  Location: Left Arm, Patient Position: Sitting, Cuff Size: Normal)   Pulse 74   Temp 97.7 F (36.5 C) (Oral)   Ht 5' 5 (  1.651 m)   Wt 157 lb (71.2 kg)   SpO2 98%   BMI 26.13 kg/m   Wt Readings from Last 3 Encounters:  07/20/23 157 lb (71.2 kg)  06/28/23 154 lb (69.9 kg)  06/10/23 158 lb 15.2 oz (72.1 kg)    Physical Exam  General appearance - alert, well appearing, and in no distress Mental status - normal mood, behavior, speech, dress, motor activity, and thought processes Eye: pink conjunctiva Chest - clear to auscultation, no wheezes, rales or rhonchi, symmetric air entry Heart - normal rate, regular rhythm, normal S1, S2, no murmurs, rubs, clicks or gallops Neurological -patient ambulates with a cane.  She has somewhat of a stooped posture with truncal flexed forward when she walks.  She has low foot floor clearance. Musculoskeletal -knees: Knee joints are enlarged.  No point tenderness.  She has good range of motion.  No swelling in the lower legs but she has some increased sensitivity with mild palpation of the calf and other muscles in the lower legs. Extremities - peripheral pulses normal, no pedal edema, no clubbing or cyanosis      Latest Ref Rng & Units 06/28/2023   12:06 PM 03/20/2023   12:20 PM 12/26/2022    1:55 PM  CMP  Glucose 65 - 99 mg/dL 867  875  881   BUN 7 - 25 mg/dL 32  27  24   Creatinine 0.60 - 1.00 mg/dL 9.13  9.05  9.14   Sodium 135 - 146 mmol/L 140  137  139   Potassium 3.5 - 5.3 mmol/L 3.9  3.9  3.9   Chloride 98 - 110 mmol/L 103  98  104   CO2 20 - 32 mmol/L 28  23  28    Calcium  8.6 - 10.4 mg/dL 8.5  8.8  8.8    Lipid Panel     Component Value Date/Time   CHOL 177 12/14/2020 1015   TRIG 88 12/14/2020 1015   HDL 84 12/14/2020 1015   CHOLHDL 2.1 12/14/2020 1015   LDLCALC 77 12/14/2020 1015    CBC    Component Value Date/Time   WBC 5.4 03/20/2023 1220   WBC 9.0 06/24/2020 0354   RBC 4.35 03/20/2023 1220   RBC 3.43 (L) 06/24/2020 0354    HGB 12.4 03/20/2023 1220   HCT 38.1 03/20/2023 1220   PLT 282 03/20/2023 1220   MCV 88 03/20/2023 1220   MCH 28.5 03/20/2023 1220   MCH 30.3 06/24/2020 0354   MCHC 32.5 03/20/2023 1220   MCHC 32.8 06/24/2020 0354   RDW 13.9 03/20/2023 1220   LYMPHSABS 1.6 06/14/2022 1650   MONOABS 0.5 05/01/2014 1948   EOSABS 0.1 06/14/2022 1650   BASOSABS 0.0 06/14/2022 1650    ASSESSMENT AND PLAN: 1. Type 2 diabetes mellitus without complication, without long-term current use of insulin (HCC) (Primary) A1c was mildly above goal when she was last assessed by her endocrinologist last month.  She will continue her oral medications including Prandin , Jardiance  25 mg daily and metformin  850 mg twice a day.  Advised to avoid sugary snacks.  Choose healthier snacks like fruits or nuts.  2. Long term (current) use of oral hypoglycemic drugs See #1 above  3. Palpitations Will check CBC and TSH today.  If those are okay, will refer to cardiology - TSH  4. History of recent fall Most recent one was due to inadequate lighting due to the electricity being off.  However I think she would benefit from some  gait safety training.  She is agreeable to referral to physical therapy - Ambulatory referral to Physical Therapy  5. Muscle pain She is on statin therapy.  Will check a CK level - CK  6. Hyperlipidemia associated with type 2 diabetes mellitus (HCC) Continue atorvastatin  20 mg daily  7. Gait disturbance See #4 above - Ambulatory referral to Physical Therapy  8. Other iron deficiency anemia - CBC - Iron, TIBC and Ferritin Panel    Patient was given the opportunity to ask questions.  Patient verbalized understanding of the plan and was able to repeat key elements of the plan.   This documentation was completed using Paediatric nurse.  Any transcriptional errors are unintentional.  No orders of the defined types were placed in this encounter.    Requested Prescriptions     No prescriptions requested or ordered in this encounter    No follow-ups on file.  Barnie Louder, MD, FACP

## 2023-07-20 NOTE — Patient Instructions (Signed)
 I have referred you for physical therapy. We will refer to cardiology if your labs come back okay. Try to drink 4-8 glasses water daily.

## 2023-07-21 ENCOUNTER — Ambulatory Visit: Payer: Self-pay | Admitting: Internal Medicine

## 2023-07-21 ENCOUNTER — Other Ambulatory Visit: Payer: Self-pay | Admitting: Internal Medicine

## 2023-07-21 DIAGNOSIS — R002 Palpitations: Secondary | ICD-10-CM

## 2023-07-21 LAB — CBC
Hematocrit: 39.8 % (ref 34.0–46.6)
Hemoglobin: 12.5 g/dL (ref 11.1–15.9)
MCH: 30.9 pg (ref 26.6–33.0)
MCHC: 31.4 g/dL — ABNORMAL LOW (ref 31.5–35.7)
MCV: 98 fL — ABNORMAL HIGH (ref 79–97)
Platelets: 198 x10E3/uL (ref 150–450)
RBC: 4.05 x10E6/uL (ref 3.77–5.28)
RDW: 13.9 % (ref 11.7–15.4)
WBC: 5.2 x10E3/uL (ref 3.4–10.8)

## 2023-07-21 LAB — IRON,TIBC AND FERRITIN PANEL
Ferritin: 25 ng/mL (ref 15–150)
Iron Saturation: 18 % (ref 15–55)
Iron: 64 ug/dL (ref 27–139)
Total Iron Binding Capacity: 360 ug/dL (ref 250–450)
UIBC: 296 ug/dL (ref 118–369)

## 2023-07-21 LAB — CK: Total CK: 92 U/L (ref 32–182)

## 2023-07-21 LAB — TSH: TSH: 1.04 u[IU]/mL (ref 0.450–4.500)

## 2023-07-26 ENCOUNTER — Other Ambulatory Visit: Payer: Self-pay

## 2023-07-26 ENCOUNTER — Ambulatory Visit: Attending: Internal Medicine

## 2023-07-26 DIAGNOSIS — R296 Repeated falls: Secondary | ICD-10-CM | POA: Insufficient documentation

## 2023-07-26 DIAGNOSIS — R262 Difficulty in walking, not elsewhere classified: Secondary | ICD-10-CM | POA: Insufficient documentation

## 2023-07-26 DIAGNOSIS — M6281 Muscle weakness (generalized): Secondary | ICD-10-CM | POA: Insufficient documentation

## 2023-07-26 DIAGNOSIS — R269 Unspecified abnormalities of gait and mobility: Secondary | ICD-10-CM | POA: Insufficient documentation

## 2023-07-26 DIAGNOSIS — Z9181 History of falling: Secondary | ICD-10-CM | POA: Diagnosis not present

## 2023-07-26 NOTE — Therapy (Unsigned)
 OUTPATIENT PHYSICAL THERAPY LOWER EXTREMITY EVALUATION   Patient Name: Norma Clay MRN: [id number] DOB:1950/12/03, 73 y.o., female Today's Date: 07/27/2023  END OF SESSION:  PT End of Session - 07/26/23 1549     Visit Number 1    Date for PT Re-Evaluation 09/20/23    Authorization Type [employer]    PT Start Time 1538    PT Stop Time 1615    PT Time Calculation (min) 37 min    Activity Tolerance Patient tolerated treatment well    Behavior During Therapy WFL for tasks assessed/performed          Past Medical History:  Diagnosis Date   Diabetes mellitus without complication (HCC)    Glaucoma    Hypertension    Osteoarthritis of right hip 05/16/2016   Posterior capsular opacification of both eyes, 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   Primary osteoarthritis of right hip 05/06/2020   Past Surgical History:  Procedure Laterality Date   ABDOMINAL HYSTERECTOMY     GLAUCOMA SURGERY     THYROIDECTOMY     TOTAL HIP ARTHROPLASTY Right 06/23/2020   Procedure: RIGHT TOTAL HIP ARTHROPLASTY ANTERIOR APPROACH;  Surgeon: Vernetta Lonni GRADE, MD;  Location: [facility] OR;  Service: Orthopedics;  Laterality: Right;   Patient Active Problem List   Diagnosis Date Noted   Stage 3a chronic kidney disease (HCC) 03/20/2023   Postsurgical hypothyroidism 12/26/2022   Mixed hyperlipidemia 08/04/2021   Gastroesophageal reflux disease 07/13/2021   Overweight (BMI 25.0-29.9) 07/13/2021   PAD (peripheral artery disease) (HCC) 12/14/2020   Pelvic pain 12/14/2020   Lower abdominal pain 12/14/2020   Uveitis of right eye 09/21/2020   Status post total replacement of right hip 06/23/2020   Diabetic neuropathy (HCC) 04/13/2020   Acquired hypothyroidism 12/16/2019   Hyperlipidemia associated with type 2 diabetes mellitus (HCC) 12/16/2019   Hammer toes of both feet 12/16/2019   Degenerative arthritis of lumbar spine 12/16/2019   Dysuria 11/22/2019   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    Essential hypertension 02/19/2019   Postsurgical states following surgery of eye and adnexa 10/01/2018   Pseudophakia of both eyes 04/23/2018   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, severe stage 04/23/2018   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, moderate stage 04/23/2018   Type 2 diabetes mellitus without complication, without long-term current use of insulin (HCC) 04/18/2016    PCP: Vicci Barnie NOVAK, MD  REFERRING PROVIDER: Vicci Barnie NOVAK, MD  REFERRING DIAG: [phone] (ICD-10-CM) - History of recent fall R26.9 (ICD-10-CM) - Gait disturbance  THERAPY DIAG:  Repeated falls - Plan: PT plan of care cert/re-cert  Difficulty in walking, not elsewhere classified - Plan: PT plan of care cert/re-cert  Muscle weakness (generalized) - Plan: PT plan of care cert/re-cert  Rationale for Evaluation and Treatment: Rehabilitation  ONSET DATE: 07/20/2023  SUBJECTIVE:   SUBJECTIVE STATEMENT: Patient arrives with daughter and interpreter.  Dtr explains that patient has been falling more frequently at home and when she went to stay with her son in [place] .  She states she has pain in both ankles and both knees.  She had right THA in 2022.  She lives with her dtr.  They have 4 steps to get into the home and approx 14 steps to get upstairs.  She has fallen in the bathroom and bedroom.  There is also a sunroom that has one step down where she fell one time as well.  She hopes to improve her strength and reduce  fall risk.    PERTINENT HISTORY: Right THA 2022 PAIN:  Are you having pain? Yes: NPRS scale: 5/10 Pain location: ankles and knees Pain description: aching Aggravating factors: walking Relieving factors: rest  PRECAUTIONS: Fall  RED FLAGS: None   WEIGHT BEARING RESTRICTIONS: No  FALLS:  Has patient fallen in last 6 months? Yes. Number of falls 4  LIVING ENVIRONMENT: Lives with: lives with their family Lives in:  House/apartment Stairs: Yes: Internal: 14 steps; on right going up and External: 4 steps; on right going up Has following equipment at home: Single point cane  OCCUPATION: retired  PLOF: Independent, Independent with basic ADLs, Independent with household mobility with device, Independent with community mobility with device, Independent with homemaking with ambulation, Independent with gait, Independent with transfers, and Requires assistive device for independence  PATIENT GOALS: to stop falling and gain strength  NEXT MD VISIT: prn  OBJECTIVE:  Note: Objective measures were completed at Evaluation unless otherwise noted.  DIAGNOSTIC FINDINGS: na  PATIENT SURVEYS:  ABC scale: The Activities-Specific Balance Confidence (ABC) Scale 0% 10 20 30  40 50 60 70 80 90 100% No confidence<->completely confident  "How confident are you that you will not lose your balance or become unsteady when you . . .   Date tested 07/27/23  Walk around the house 90%  2. Walk up or down stairs 70%  3. Bend over and pick up a slipper from in front of a closet floor 50%  4. Reach for a small can off a shelf at eye level 70%  5. Stand on tip toes and reach for something above your head 70%  6. Stand on a chair and reach for something 0%  7. Sweep the floor 20%  8. Walk outside the house to a car parked in the driveway 70%  9. Get into or out of a car 70%  10. Walk across a parking lot to the mall 70%  11. Walk up or down a ramp 70%  12. Walk in a crowded mall where people rapidly walk past you 50%  13. Are bumped into by people as you walk through the mall 50%  14. Step onto or off of an escalator while you are holding onto the railing 50%  15. Step onto or off an escalator while holding onto parcels such that you cannot hold onto the railing 20%  16. Walk outside on icy sidewalks 0%  Total: #/16 51.25%   Due to language barrier, these answers based on patient and family subjective reports.    COGNITION: Overall cognitive status: Within functional limits for tasks assessed     SENSATION: WFL   MUSCLE LENGTH: Hamstrings: Unable to test but obvious knee flexion contractures bilaterally R > L Thomas test: Unable to test but obvious limitations due to fwd trunk / bent over posture  POSTURE: rounded shoulders, forward head, decreased lumbar lordosis, increased thoracic kyphosis, posterior pelvic tilt, and flexed trunk   PALPATION: na  LOWER EXTREMITY ROM:  Lacking approx 25-30 knee extension bilaterally,  limited hip mobility, limited ankle mobility all bilaterally  LOWER EXTREMITY MMT:  Generally 3+ to 4-/5   FUNCTIONAL TESTS:  5 times sit to stand: 35.32 sec Timed up and go (TUG): 26.65 sec  GAIT: Distance walked: 30 feet Assistive device utilized: Single point cane Level of assistance: SBA Comments: slow/guarded    Special tests: Balance: Modified CTSIB Floor eo: normal Floor ec: 3 sed Pad eo: unable Pad ec: unable  TREATMENT DATE: 07/26/23     PATIENT EDUCATION:  Education details: Initiated HEP and educated on home safety provided including grab bars, hand rails, proper assistive devices, and home medical equipment Person educated: Patient and Child(ren) Education method: Explanation, Demonstration, and Verbal cues Education comprehension: verbalized understanding, returned demonstration, verbal cues required, and tactile cues required  HOME EXERCISE PROGRAM: Assigned sit to stand, knee extension propping and seated LAQ (did not have time to print due to time constraints due to language barrier  ASSESSMENT:  CLINICAL IMPRESSION: Patient is a 73 y.o. female who was seen today for physical therapy evaluation and treatment for frequent falls.  She has had 4 falls in the past few months, no injuries but family would like to work  on balance and leg strength with her along with fall prevention to reduce fall risk.  She presents with bilateral knee flexion contractures, fwd bent posture, rounded shoulders and fwd head.  She had a right THA in 2022.  Her right knee appears to be quite edematous and arthritic.  She has some pain in both ankles and both knees.  She ambulates with flexed knee posture and minimal heel strike, which, along with her fwd bent posture, puts her at risk for falls due to fwd momentum and frequently catching the toes on swing through. There is a language barrier but interpreter was provided.  She would benefit from skilled PT for LE ROM and strengthening, balance training, fall prevention.    OBJECTIVE IMPAIRMENTS: Abnormal gait, decreased balance, decreased endurance, decreased mobility, difficulty walking, decreased ROM, decreased strength, hypomobility, increased fascial restrictions, increased muscle spasms, impaired flexibility, improper body mechanics, postural dysfunction, and pain.   ACTIVITY LIMITATIONS: carrying, lifting, bending, standing, squatting, stairs, transfers, bed mobility, continence, bathing, toileting, dressing, hygiene/grooming, and caring for others  PARTICIPATION LIMITATIONS: meal prep, cleaning, laundry, shopping, community activity, and church  PERSONAL FACTORS: Age, Fitness, and 1-2 comorbidities: right THA, htn, diabetes are also affecting patient's functional outcome.   REHAB POTENTIAL: Fair due to severity of OA and contractures  CLINICAL DECISION MAKING: Evolving/moderate complexity  EVALUATION COMPLEXITY: Moderate   GOALS: Goals reviewed with patient? Yes  SHORT TERM GOALS: Target date: 08/23/2023  Patient to be independent with initial HEP Baseline: Goal status: INITIAL  2.  Pain report to be no greater than 4/10  Baseline:  Goal status: INITIAL  3.  No falls reported during first 4 weeks of PT  Baseline:  Goal status: INITIAL   LONG TERM GOALS: Target  date: 09/20/2023  Patient to report pain no greater than 2/10  Baseline:  Goal status: INITIAL  2.  Patient to be independent with advanced HEP  Baseline:  Goal status: INITIAL  3.  Patient to be able to ascend and descend steps safely with proper sequence and assistive device Baseline:  Goal status: INITIAL  4.  Patient to be able to stand or walk for at least 300 feet without loss of balance using least restrictive assistive device Baseline:  Goal status: INITIAL  5.  Patient to be able to bend, stoop and squat safely to pick up objects from floor with no loss of balance using proper weight shifting strategies and UE assist   Baseline:  Goal status: INITIAL  6.  No falls for the 8 weeks of formal PT Baseline:  Goal status: INITIAL   PLAN:  PT FREQUENCY: 1-2x/week  PT DURATION: 8 weeks  PLANNED INTERVENTIONS: 97110-Therapeutic exercises, 97530- Therapeutic activity, [id number]- Neuromuscular re-education, 97535- Self Care, 02859- Manual  therapy, [id number]- Gait training, 02886- Aquatic Therapy, [phone]- Electrical stimulation (unattended), [phone]- Electrical stimulation (manual), [phone]- Vasopneumatic device, Patient/Family education, Balance training, Stair training, Taping, Joint mobilization, Scar mobilization, Vestibular training, Visual/preceptual remediation/compensation, DME instructions, Cryotherapy, and Moist heat  PLAN FOR NEXT SESSION: Nustep, review HEP, begin balance training and fall prevention, focus on upright posture and knee extension.   Delon B. Penelope Fittro, PT 07/27/23 12:46 PM [facility] [address], [address] [place], [place] [address] Phone # [phone] Fax [phone]

## 2023-07-31 ENCOUNTER — Other Ambulatory Visit: Payer: Self-pay | Admitting: Internal Medicine

## 2023-07-31 ENCOUNTER — Ambulatory Visit: Payer: Self-pay | Admitting: Physical Therapy

## 2023-07-31 ENCOUNTER — Encounter: Payer: Self-pay | Admitting: Physical Therapy

## 2023-07-31 DIAGNOSIS — R296 Repeated falls: Secondary | ICD-10-CM

## 2023-07-31 DIAGNOSIS — Z9181 History of falling: Secondary | ICD-10-CM | POA: Diagnosis not present

## 2023-07-31 DIAGNOSIS — J301 Allergic rhinitis due to pollen: Secondary | ICD-10-CM

## 2023-07-31 DIAGNOSIS — R262 Difficulty in walking, not elsewhere classified: Secondary | ICD-10-CM

## 2023-07-31 DIAGNOSIS — R269 Unspecified abnormalities of gait and mobility: Secondary | ICD-10-CM | POA: Diagnosis not present

## 2023-07-31 DIAGNOSIS — M6281 Muscle weakness (generalized): Secondary | ICD-10-CM

## 2023-07-31 NOTE — Therapy (Signed)
 OUTPATIENT PHYSICAL THERAPY LOWER EXTREMITY TREATMENT   Patient Name: Norma Clay MRN: [id number] DOB:1950/10/12, 73 y.o., female Today's Date: 07/31/2023  END OF SESSION:  PT End of Session - 07/31/23 1233     Visit Number 2    Date for PT Re-Evaluation 09/20/23    Authorization Type [employer] approved 16 visits 07/26/2023 - 09/20/2023 [id number]    Authorization Time Period 07/26/2023 - 09/20/2023 [id number]    Authorization - Visit Number 2    Authorization - Number of Visits 16    Progress Note Due on Visit 10    PT Start Time 1149    PT Stop Time 1229    PT Time Calculation (min) 40 min    Activity Tolerance Patient tolerated treatment well    Behavior During Therapy [facility] for tasks assessed/performed           Past Medical History:  Diagnosis Date   Diabetes mellitus without complication (HCC)    Glaucoma    Hypertension    Osteoarthritis of right hip 05/16/2016   Posterior capsular opacification of both eyes, 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   Primary osteoarthritis of right hip 05/06/2020   Past Surgical History:  Procedure Laterality Date   ABDOMINAL HYSTERECTOMY     GLAUCOMA SURGERY     THYROIDECTOMY     TOTAL HIP ARTHROPLASTY Right 06/23/2020   Procedure: RIGHT TOTAL HIP ARTHROPLASTY ANTERIOR APPROACH;  Surgeon: Vernetta Lonni GRADE, MD;  Location: [facility] OR;  Service: Orthopedics;  Laterality: Right;   Patient Active Problem List   Diagnosis Date Noted   Stage 3a chronic kidney disease (HCC) 03/20/2023   Postsurgical hypothyroidism 12/26/2022   Mixed hyperlipidemia 08/04/2021   Gastroesophageal reflux disease 07/13/2021   Overweight (BMI 25.0-29.9) 07/13/2021   PAD (peripheral artery disease) (HCC) 12/14/2020   Pelvic pain 12/14/2020   Lower abdominal pain 12/14/2020   Uveitis of right eye 09/21/2020   Status post total replacement of right hip 06/23/2020   Diabetic neuropathy (HCC) 04/13/2020   Acquired hypothyroidism 12/16/2019   Hyperlipidemia  associated with type 2 diabetes mellitus (HCC) 12/16/2019   Hammer toes of both feet 12/16/2019   Degenerative arthritis of lumbar spine 12/16/2019   Dysuria 11/22/2019   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   Essential hypertension 02/19/2019   Postsurgical states following surgery of eye and adnexa 10/01/2018   Pseudophakia of both eyes 04/23/2018   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, severe stage 04/23/2018   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 No new complaints. Interpreter present  From Eval: Patient arrives with daughter and interpreter.  Dtr explains that patient has been falling more frequently at home and when she went to stay with her son in [place] .  She states she has pain in both ankles and both knees.  She had right THA in 2022.  She lives with her dtr.  They have 4 steps to get into the home and approx 14 steps to get  upstairs.  She has fallen in the bathroom and bedroom.  There is also a sunroom that has one step down where she fell one time as well.  She hopes to improve her strength and reduce fall risk.    PERTINENT HISTORY: Right THA 2022 PAIN:  Are you having pain? Yes: NPRS scale: 5/10 Pain location: ankles and knees Pain description: aching Aggravating factors: walking Relieving factors: rest  PRECAUTIONS:  Fall  RED FLAGS: None   WEIGHT BEARING RESTRICTIONS: No  FALLS:  Has patient fallen in last 6 months? Yes. Number of falls 4  LIVING ENVIRONMENT: Lives with: lives with their family Lives in: House/apartment Stairs: Yes: Internal: 14 steps; on right going up and External: 4 steps; on right going up Has following equipment at home: Single point cane  OCCUPATION: retired  PLOF: Independent, Independent with basic ADLs, Independent with household mobility with device, Independent with community mobility with device, Independent with homemaking with ambulation, Independent with gait, Independent with transfers, and Requires assistive device for independence  PATIENT GOALS: to stop falling and gain strength  NEXT MD VISIT: prn  OBJECTIVE:  Note: Objective measures were completed at Evaluation unless otherwise noted.  DIAGNOSTIC FINDINGS: na  PATIENT SURVEYS:  ABC scale: The Activities-Specific Balance Confidence (ABC) Scale 0% 10 20 30  40 50 60 70 80 90 100% No confidence<->completely confident  "How confident are you that you will not lose your balance or become unsteady when you . . .   Date tested 07/27/23  Walk around the house 90%  2. Walk up or down stairs 70%  3. Bend over and pick up a slipper from in front of a closet floor 50%  4. Reach for a small can off a shelf at eye level 70%  5. Stand on tip toes and reach for something above your head 70%  6. Stand on a chair and reach for something 0%  7. Sweep the floor 20%  8. Walk outside the house to a car parked in the driveway 70%  9. Get into or out of a car 70%  10. Walk across a parking lot to the mall 70%  11. Walk up or down a ramp 70%  12. Walk in a crowded mall where people rapidly walk past you 50%  13. Are bumped into by people as you walk through the mall 50%  14. Step onto or off of an escalator while you are holding onto the railing 50%  15. Step onto or off an escalator while holding onto parcels such  that you cannot hold onto the railing 20%  16. Walk outside on icy sidewalks 0%  Total: #/16 51.25%   Due to language barrier, these answers based on patient and family subjective reports.   COGNITION: Overall cognitive status: Within functional limits for tasks assessed     SENSATION: WFL   MUSCLE LENGTH: Hamstrings: Unable to test but obvious knee flexion contractures bilaterally R > L Thomas test: Unable to test but obvious limitations due to fwd trunk / bent over posture  POSTURE: rounded shoulders, forward head, decreased lumbar lordosis, increased thoracic kyphosis, posterior pelvic tilt, and flexed trunk   PALPATION: na  LOWER EXTREMITY ROM:  Lacking approx 25-30 knee extension bilaterally,  limited hip mobility, limited ankle mobility all bilaterally  LOWER EXTREMITY MMT:  Generally 3+ to 4-/5   FUNCTIONAL TESTS:  5 times sit to stand: 35.32 sec Timed up and go (TUG): 26.65 sec  GAIT: Distance walked: 30 feet Assistive  device utilized: Single point cane Level of assistance: SBA Comments: slow/guarded    Special tests: Balance: Modified CTSIB Floor eo: normal Floor ec: 3 sed Pad eo: unable Pad ec: unable                                                                                                                            TREATMENT DATE:  07/31/2023 Seated knee extension propping x 45 sec bilateral  Seated LAQ 2 x 10 bilateral  Sit to stand 2 x 8 (with UE support) Weight shifts on airex (M/L )x 1 min 20 sec each (patient could not make it to two minutes due to bilateral knee pain) Seated ball press 2 x 10 Standing shoulder row and extension with red TB x 12 each 4 inch step tap x 10 bilateral unilateral support at barre Seated thoracic extension with small blue ball x 10 Single leg flat cone tap forward and lateral x 5 each unilateral support at barre    07/26/23     PATIENT EDUCATION:  Education details: Initiated HEP and educated on home  safety provided including grab bars, hand rails, proper assistive devices, and home medical equipment Person educated: Patient and Child(ren) Education method: Explanation, Demonstration, and Verbal cues Education comprehension: verbalized understanding, returned demonstration, verbal cues required, and tactile cues required  HOME EXERCISE PROGRAM: Access Code: [email or web address] URL: [email or web address] Date: 07/31/2023 Prepared by: Kristeen Sar  Exercises - Sit to Stand with Armchair  - 1 x daily - 7 x weekly - 2 sets - 5-8 reps - Seated Long Arc Quad  - 1 x daily - 7 x weekly - 2 sets - 10 reps - Seated Knee Extension Stretch with Chair  - 1 x daily - 7 x weekly - 2 sets - 30s hold - Standing Shoulder Row with Anchored Resistance  - 1 x daily - 7 x weekly - 1 sets - 12 reps - Standing Shoulder Extension with Resistance  - 1 x daily - 7 x weekly - 1 sets - 12 reps ASSESSMENT:  CLINICAL IMPRESSION: Patient presents to first follow up appointment since evaluation. She verbalized poor compliance to HEP, but she was able to demonstrate all exercises. Provided patient with a printout of exercises. Today's treatment session focused on postural strengthening and single leg balance. While performing exercises patient required frequent verbal and tactile cues for upright posture. She was apprehensive at performing balance exercises without UE support. Patient should respond well to physical therapy.  OBJECTIVE IMPAIRMENTS: Abnormal gait, decreased balance, decreased endurance, decreased mobility, difficulty walking, decreased ROM, decreased strength, hypomobility, increased fascial restrictions, increased muscle spasms, impaired flexibility, improper body mechanics, postural dysfunction, and pain.   ACTIVITY LIMITATIONS: carrying, lifting, bending, standing, squatting, stairs, transfers, bed mobility, continence, bathing, toileting, dressing, hygiene/grooming, and caring for  others  PARTICIPATION LIMITATIONS: meal prep, cleaning, laundry, shopping, community activity, and church  PERSONAL FACTORS: Age, Fitness, and 1-2 comorbidities: right THA, htn, diabetes are also affecting  patient's functional outcome.   REHAB POTENTIAL: Fair due to severity of OA and contractures  CLINICAL DECISION MAKING: Evolving/moderate complexity  EVALUATION COMPLEXITY: Moderate   GOALS: Goals reviewed with patient? Yes  SHORT TERM GOALS: Target date: 08/23/2023  Patient to be independent with initial HEP Baseline: Goal status: INITIAL  2.  Pain report to be no greater than 4/10  Baseline:  Goal status: INITIAL  3.  No falls reported during first 4 weeks of PT  Baseline:  Goal status: INITIAL   LONG TERM GOALS: Target date: 09/20/2023  Patient to report pain no greater than 2/10  Baseline:  Goal status: INITIAL  2.  Patient to be independent with advanced HEP  Baseline:  Goal status: INITIAL  3.  Patient to be able to ascend and descend steps safely with proper sequence and assistive device Baseline:  Goal status: INITIAL  4.  Patient to be able to stand or walk for at least 300 feet without loss of balance using least restrictive assistive device Baseline:  Goal status: INITIAL  5.  Patient to be able to bend, stoop and squat safely to pick up objects from floor with no loss of balance using proper weight shifting strategies and UE assist   Baseline:  Goal status: INITIAL  6.  No falls for the 8 weeks of formal PT Baseline:  Goal status: INITIAL   PLAN:  PT FREQUENCY: 1-2x/week  PT DURATION: 8 weeks  PLANNED INTERVENTIONS: 97110-Therapeutic exercises, 97530- Therapeutic activity, [id number]- Neuromuscular re-education, 97535- Self Care, 02859- Manual therapy, [phone]- Gait training, [phone]- Aquatic Therapy, [phone]- Electrical stimulation (unattended), [phone]- Electrical stimulation (manual), [id number]- Vasopneumatic device, Patient/Family education, Balance  training, Stair training, Taping, Joint mobilization, Scar mobilization, Vestibular training, Visual/preceptual remediation/compensation, DME instructions, Cryotherapy, and Moist heat  PLAN FOR NEXT SESSION: assess HEP compliance; Nustep, review ;balance training and fall prevention, focus on upright posture and knee extension.   Kristeen Sar, PT 07/31/23 12:35 PM [facility] [address] [place], [place] [address] Phone # [phone] Fax [phone]

## 2023-08-09 ENCOUNTER — Ambulatory Visit: Payer: Self-pay | Attending: Internal Medicine | Admitting: Physical Therapy

## 2023-08-09 ENCOUNTER — Encounter: Payer: Self-pay | Admitting: Physical Therapy

## 2023-08-09 DIAGNOSIS — R252 Cramp and spasm: Secondary | ICD-10-CM | POA: Diagnosis present

## 2023-08-09 DIAGNOSIS — R296 Repeated falls: Secondary | ICD-10-CM | POA: Insufficient documentation

## 2023-08-09 DIAGNOSIS — R262 Difficulty in walking, not elsewhere classified: Secondary | ICD-10-CM | POA: Insufficient documentation

## 2023-08-09 DIAGNOSIS — R293 Abnormal posture: Secondary | ICD-10-CM | POA: Insufficient documentation

## 2023-08-09 DIAGNOSIS — M6281 Muscle weakness (generalized): Secondary | ICD-10-CM | POA: Insufficient documentation

## 2023-08-09 NOTE — Therapy (Signed)
 OUTPATIENT PHYSICAL THERAPY LOWER EXTREMITY TREATMENT   Patient Name: Norma Clay MRN: [id number] DOB:Oct 04, 1950, 73 y.o., female Today's Date: 08/09/2023  END OF SESSION:  PT End of Session - 08/09/23 1533     Visit Number 3    Date for PT Re-Evaluation 09/20/23    Authorization Type [employer] approved 16 visits 07/26/2023 - 09/20/2023 [id number]    Authorization Time Period 07/26/2023 - 09/20/2023 [id number]    Authorization - Visit Number 3    Authorization - Number of Visits 16    Progress Note Due on Visit 10    PT Start Time 1455    PT Stop Time 1530    PT Time Calculation (min) 35 min    Activity Tolerance Patient tolerated treatment well    Behavior During Therapy [facility] for tasks assessed/performed            Past Medical History:  Diagnosis Date   Diabetes mellitus without complication (HCC)    Glaucoma    Hypertension    Osteoarthritis of right hip 05/16/2016   Posterior capsular opacification of both eyes, 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   Primary osteoarthritis of right hip 05/06/2020   Past Surgical History:  Procedure Laterality Date   ABDOMINAL HYSTERECTOMY     GLAUCOMA SURGERY     THYROIDECTOMY     TOTAL HIP ARTHROPLASTY Right 06/23/2020   Procedure: RIGHT TOTAL HIP ARTHROPLASTY ANTERIOR APPROACH;  Surgeon: Vernetta Lonni GRADE, MD;  Location: [facility] OR;  Service: Orthopedics;  Laterality: Right;   Patient Active Problem List   Diagnosis Date Noted   Stage 3a chronic kidney disease (HCC) 03/20/2023   Postsurgical hypothyroidism 12/26/2022   Mixed hyperlipidemia 08/04/2021   Gastroesophageal reflux disease 07/13/2021   Overweight (BMI 25.0-29.9) 07/13/2021   PAD (peripheral artery disease) (HCC) 12/14/2020   Pelvic pain 12/14/2020   Lower abdominal pain 12/14/2020   Uveitis of right eye 09/21/2020   Status post total replacement of right hip 06/23/2020   Diabetic neuropathy (HCC) 04/13/2020   Acquired hypothyroidism 12/16/2019   Hyperlipidemia  associated with type 2 diabetes mellitus (HCC) 12/16/2019   Hammer toes of both feet 12/16/2019   Degenerative arthritis of lumbar spine 12/16/2019   Dysuria 11/22/2019   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   Essential hypertension 02/19/2019   Postsurgical states following surgery of eye and adnexa 10/01/2018   Pseudophakia of both eyes 04/23/2018   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, severe stage 04/23/2018   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 She woke up with right arm pain today. Interpreter is present to translate   From Eval: Patient arrives with daughter and interpreter.  Dtr explains that patient has been falling more frequently at home and when she went to stay with her son in [place] .  She states she has pain in both ankles and both knees.  She had right THA in 2022.  She lives with her dtr.  They have 4 steps to get  into the home and approx 14 steps to get upstairs.  She has fallen in the bathroom and bedroom.  There is also a sunroom that has one step down where she fell one time as well.  She hopes to improve her strength and reduce fall risk.    PERTINENT HISTORY: Right THA 2022 PAIN:  Are you having pain? Yes: NPRS scale: 5/10 Pain location: ankles and knees Pain description: aching Aggravating factors: walking Relieving  factors: rest  PRECAUTIONS: Fall  RED FLAGS: None   WEIGHT BEARING RESTRICTIONS: No  FALLS:  Has patient fallen in last 6 months? Yes. Number of falls 4  LIVING ENVIRONMENT: Lives with: lives with their family Lives in: House/apartment Stairs: Yes: Internal: 14 steps; on right going up and External: 4 steps; on right going up Has following equipment at home: Single point cane  OCCUPATION: retired  PLOF: Independent, Independent with basic ADLs, Independent with household mobility with device, Independent with community mobility with device, Independent with homemaking with ambulation, Independent with gait, Independent with transfers, and Requires assistive device for independence  PATIENT GOALS: to stop falling and gain strength  NEXT MD VISIT: prn  OBJECTIVE:  Note: Objective measures were completed at Evaluation unless otherwise noted.  DIAGNOSTIC FINDINGS: na  PATIENT SURVEYS:  ABC scale: The Activities-Specific Balance Confidence (ABC) Scale 0% 10 20 30  40 50 60 70 80 90 100% No confidence<->completely confident  "How confident are you that you will not lose your balance or become unsteady when you . . .   Date tested 07/27/23  Walk around the house 90%  2. Walk up or down stairs 70%  3. Bend over and pick up a slipper from in front of a closet floor 50%  4. Reach for a small can off a shelf at eye level 70%  5. Stand on tip toes and reach for something above your head 70%  6. Stand on a chair and reach for something 0%  7. Sweep the floor 20%  8. Walk outside the house to a car parked in the driveway 70%  9. Get into or out of a car 70%  10. Walk across a parking lot to the mall 70%  11. Walk up or down a ramp 70%  12. Walk in a crowded mall where people rapidly walk past you 50%  13. Are bumped into by people as you walk through the mall 50%  14. Step onto or off of an escalator while you are holding onto the railing 50%  15. Step onto or off an escalator  while holding onto parcels such that you cannot hold onto the railing 20%  16. Walk outside on icy sidewalks 0%  Total: #/16 51.25%   Due to language barrier, these answers based on patient and family subjective reports.   COGNITION: Overall cognitive status: Within functional limits for tasks assessed     SENSATION: WFL   MUSCLE LENGTH: Hamstrings: Unable to test but obvious knee flexion contractures bilaterally R > L Thomas test: Unable to test but obvious limitations due to fwd trunk / bent over posture  POSTURE: rounded shoulders, forward head, decreased lumbar lordosis, increased thoracic kyphosis, posterior pelvic tilt, and flexed trunk   PALPATION: na  LOWER EXTREMITY ROM:  Lacking approx 25-30 knee extension bilaterally,  limited hip mobility, limited ankle mobility all bilaterally  LOWER EXTREMITY MMT:  Generally 3+ to 4-/5   FUNCTIONAL TESTS:  5 times sit to stand: 35.32 sec Timed up and go (TUG):  26.65 sec  GAIT: Distance walked: 30 feet Assistive device utilized: Single point cane Level of assistance: SBA Comments: slow/guarded    Special tests: Balance: Modified CTSIB Floor eo: normal Floor ec: 3 sed Pad eo: unable Pad ec: unable                                                                                                                            TREATMENT DATE:  08/09/2023 Patient was late to appointment NuStep Level 1 (no UE ) x 5 mins - PT present to discuss status Seated LAQ 2 x 10 bilateral 2# AW Standing march & hip abduction with 2# x 10 bilateral  Sit to stand 2 x 8 (with UE support) Standing shoulder row and extension with red TB x 15 each Hurdles (forwards & lateral) x 3 laps UE support at barre (needed a rest break in between) Airex step ups x 10 bilateral  Seated thoracic extension with small blue ball behind back 2 x 10     07/31/2023 Seated knee extension propping x 45 sec bilateral  Seated LAQ 2 x 10 bilateral  Sit to stand  2 x 8 (with UE support) Weight shifts on airex (M/L )x 1 min 20 sec each (patient could not make it to two minutes due to bilateral knee pain) Seated ball press 2 x 10 Standing shoulder row and extension with red TB x 12 each 4 inch step tap x 10 bilateral unilateral support at barre Seated thoracic extension with small blue ball x 10 Single leg flat cone tap forward and lateral x 5 each unilateral support at barre    07/26/23     PATIENT EDUCATION:  Education details: Initiated HEP and educated on home safety provided including grab bars, hand rails, proper assistive devices, and home medical equipment Person educated: Patient and Child(ren) Education method: Explanation, Demonstration, and Verbal cues Education comprehension: verbalized understanding, returned demonstration, verbal cues required, and tactile cues required  HOME EXERCISE PROGRAM: Access Code: [email or web address] URL: [email or web address] Date: 07/31/2023 Prepared by: Kristeen Sar  Exercises - Sit to Stand with Armchair  - 1 x daily - 7 x weekly - 2 sets - 5-8 reps - Seated Long Arc Quad  - 1 x daily - 7 x weekly - 2 sets - 10 reps - Seated Knee Extension Stretch with Chair  - 1 x daily - 7 x weekly - 2 sets - 30s hold - Standing Shoulder Row with Anchored Resistance  - 1 x daily - 7 x weekly - 1 sets - 12 reps - Standing Shoulder Extension with Resistance  - 1 x daily - 7 x weekly - 1 sets - 12 reps ASSESSMENT:  CLINICAL IMPRESSION: Patient reports improved compliance to HEP. She has been trying to replicate all exercises that we have done in the clinic. Treatment session focused on standing tolerance and functional strengthening. Patient required occasional seated rest breaks due to fatigue.  Overall, she tolerated treatment sessions  well. Required occasional verbal cues for upright posture. Patient will benefit from skilled PT to address the below impairments and improve overall function.   OBJECTIVE  IMPAIRMENTS: Abnormal gait, decreased balance, decreased endurance, decreased mobility, difficulty walking, decreased ROM, decreased strength, hypomobility, increased fascial restrictions, increased muscle spasms, impaired flexibility, improper body mechanics, postural dysfunction, and pain.   ACTIVITY LIMITATIONS: carrying, lifting, bending, standing, squatting, stairs, transfers, bed mobility, continence, bathing, toileting, dressing, hygiene/grooming, and caring for others  PARTICIPATION LIMITATIONS: meal prep, cleaning, laundry, shopping, community activity, and church  PERSONAL FACTORS: Age, Fitness, and 1-2 comorbidities: right THA, htn, diabetes are also affecting patient's functional outcome.   REHAB POTENTIAL: Fair due to severity of OA and contractures  CLINICAL DECISION MAKING: Evolving/moderate complexity  EVALUATION COMPLEXITY: Moderate   GOALS: Goals reviewed with patient? Yes  SHORT TERM GOALS: Target date: 08/23/2023  Patient to be independent with initial HEP Baseline: Goal status: INITIAL  2.  Pain report to be no greater than 4/10  Baseline:  Goal status: INITIAL  3.  No falls reported during first 4 weeks of PT  Baseline:  Goal status: INITIAL   LONG TERM GOALS: Target date: 09/20/2023  Patient to report pain no greater than 2/10  Baseline:  Goal status: INITIAL  2.  Patient to be independent with advanced HEP  Baseline:  Goal status: INITIAL  3.  Patient to be able to ascend and descend steps safely with proper sequence and assistive device Baseline:  Goal status: INITIAL  4.  Patient to be able to stand or walk for at least 300 feet without loss of balance using least restrictive assistive device Baseline:  Goal status: INITIAL  5.  Patient to be able to bend, stoop and squat safely to pick up objects from floor with no loss of balance using proper weight shifting strategies and UE assist   Baseline:  Goal status: INITIAL  6.  No falls for  the 8 weeks of formal PT Baseline:  Goal status: INITIAL   PLAN:  PT FREQUENCY: 1-2x/week  PT DURATION: 8 weeks  PLANNED INTERVENTIONS: 97110-Therapeutic exercises, 97530- Therapeutic activity, [id number]- Neuromuscular re-education, 97535- Self Care, [id number]- Manual therapy, [phone]- Gait training, [phone]- Aquatic Therapy, [phone]- Electrical stimulation (unattended), [phone]- Electrical stimulation (manual), [id number]- Vasopneumatic device, Patient/Family education, Balance training, Stair training, Taping, Joint mobilization, Scar mobilization, Vestibular training, Visual/preceptual remediation/compensation, DME instructions, Cryotherapy, and Moist heat  PLAN FOR NEXT SESSION:  Nustep ;balance training and fall prevention, focus on upright posture and knee extension.   Kristeen Sar, PT 08/09/23 3:34 PM [facility] [address] [place], [place] [address] Phone # [phone] Fax [phone]

## 2023-08-12 ENCOUNTER — Other Ambulatory Visit: Payer: Self-pay | Admitting: Internal Medicine

## 2023-08-16 ENCOUNTER — Ambulatory Visit

## 2023-08-22 ENCOUNTER — Ambulatory Visit

## 2023-08-22 DIAGNOSIS — R296 Repeated falls: Secondary | ICD-10-CM

## 2023-08-22 DIAGNOSIS — M6281 Muscle weakness (generalized): Secondary | ICD-10-CM

## 2023-08-22 DIAGNOSIS — R252 Cramp and spasm: Secondary | ICD-10-CM

## 2023-08-22 DIAGNOSIS — R262 Difficulty in walking, not elsewhere classified: Secondary | ICD-10-CM

## 2023-08-22 NOTE — Therapy (Signed)
 OUTPATIENT PHYSICAL THERAPY LOWER EXTREMITY TREATMENT   Patient Name: Norma Clay MRN: [id number] DOB:[birth year], 73 y.o., female Today's Date: 08/22/2023  END OF SESSION:  PT End of Session - 08/22/23 1411     Visit Number 4    Number of Visits 16    Date for PT Re-Evaluation 09/20/23    Authorization Type [employer] approved 16 visits 07/26/2023 - 09/20/2023 [id number]    Authorization Time Period 07/26/2023 - 09/20/2023 [id number]    Authorization - Visit Number 4    Authorization - Number of Visits 16    Progress Note Due on Visit 10    PT Start Time 1405    PT Stop Time 1448    PT Time Calculation (min) 43 min    Activity Tolerance Patient tolerated treatment well    Behavior During Therapy [facility] for tasks assessed/performed            Past Medical History:  Diagnosis Date   Diabetes mellitus without complication (HCC)    Glaucoma    Hypertension    Osteoarthritis of right hip 05/16/2016   Posterior capsular opacification of both eyes, 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   Primary osteoarthritis of right hip 05/06/2020   Past Surgical History:  Procedure Laterality Date   ABDOMINAL HYSTERECTOMY     GLAUCOMA SURGERY     THYROIDECTOMY     TOTAL HIP ARTHROPLASTY Right 06/23/2020   Procedure: RIGHT TOTAL HIP ARTHROPLASTY ANTERIOR APPROACH;  Surgeon: Vernetta Lonni GRADE, MD;  Location: [facility] OR;  Service: Orthopedics;  Laterality: Right;   Patient Active Problem List   Diagnosis Date Noted   Stage 3a chronic kidney disease (HCC) 03/20/2023   Postsurgical hypothyroidism 12/26/2022   Mixed hyperlipidemia 08/04/2021   Gastroesophageal reflux disease 07/13/2021   Overweight (BMI 25.0-29.9) 07/13/2021   PAD (peripheral artery disease) (HCC) 12/14/2020   Pelvic pain 12/14/2020   Lower abdominal pain 12/14/2020   Uveitis of right eye 09/21/2020   Status post total replacement of right hip 06/23/2020   Diabetic neuropathy (HCC) 04/13/2020   Acquired hypothyroidism  12/16/2019   Hyperlipidemia associated with type 2 diabetes mellitus (HCC) 12/16/2019   Hammer toes of both feet 12/16/2019   Degenerative arthritis of lumbar spine 12/16/2019   Dysuria 11/22/2019   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   Essential hypertension 02/19/2019   Postsurgical states following surgery of eye and adnexa 10/01/2018   Pseudophakia of both eyes 04/23/2018   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, severe stage 04/23/2018   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  She reports she is doing a little better but still unable to stand long.     From Eval: Patient arrives with daughter and interpreter.  Dtr explains that patient has been falling more frequently at home and when she went to stay with her son in [place] .  She states she has pain in both ankles and both knees.  She had right THA in 2022.  She lives with her dtr.  They have 4 steps to get into the home and approx 14 steps to get upstairs.  She has fallen in the bathroom and bedroom.  There is also a sunroom that has one step down where she fell one time as well.  She hopes to improve her strength and reduce fall risk.    PERTINENT HISTORY: Right THA 2022 PAIN:  08/22/23 Are you having pain? Yes: NPRS scale: 4/10 Pain location: ankles and knees Pain  description: aching Aggravating factors: walking Relieving factors: rest  PRECAUTIONS: Fall  RED FLAGS: None   WEIGHT BEARING RESTRICTIONS: No  FALLS:  Has patient fallen in last 6 months? Yes. Number of falls 4  LIVING ENVIRONMENT: Lives with: lives with their family Lives in: House/apartment Stairs: Yes: Internal: 14 steps; on right going up and External: 4 steps; on right going up Has following equipment at home: Single point cane  OCCUPATION: retired  PLOF: Independent, Independent with basic ADLs, Independent with household mobility with device, Independent with community mobility with device, Independent with homemaking with ambulation, Independent with gait, Independent with transfers, and Requires assistive device for independence  PATIENT GOALS: to stop falling and gain strength  NEXT MD VISIT: prn  OBJECTIVE:  Note: Objective measures were completed at Evaluation unless otherwise noted.  DIAGNOSTIC FINDINGS: na  PATIENT SURVEYS:  ABC scale: The Activities-Specific Balance Confidence (ABC) Scale 0% 10 20 30  40 50 60 70 80 90 100% No confidence<->completely confident  "How confident are you that you will not lose your balance or become unsteady when you . . .   Date tested 07/27/23  Walk around the house 90%  2. Walk up or down stairs 70%  3. Bend over and pick up a slipper from in front of a closet floor 50%  4. Reach for a small can off a shelf at eye level 70%  5. Stand on tip toes and reach for something above your head 70%  6. Stand on a chair and reach for something 0%  7. Sweep the floor 20%  8. Walk outside the house to a car parked in the driveway 70%  9. Get into or out of a car 70%  10. Walk across a parking lot to the mall 70%  11. Walk up or down a ramp 70%  12. Walk in a crowded mall where people rapidly walk past you 50%  13. Are bumped into by people as you walk through the mall 50%  14. Step onto or off of an escalator while you are holding  onto the railing 50%  15. Step onto or off an escalator while holding onto parcels such that you cannot hold onto the railing 20%  16. Walk outside on icy sidewalks 0%  Total: #/16 51.25%   Due to language barrier, these answers based on patient and family subjective reports.   COGNITION: Overall cognitive status: Within functional limits for tasks assessed     SENSATION: WFL   MUSCLE LENGTH: Hamstrings: Unable to test but obvious knee flexion contractures bilaterally R > L Thomas test: Unable to test but obvious limitations due to fwd trunk / bent over posture  POSTURE: rounded shoulders, forward head, decreased lumbar lordosis, increased thoracic kyphosis, posterior pelvic tilt, and flexed trunk   PALPATION: na  LOWER EXTREMITY ROM:  Lacking approx 25-30 knee extension bilaterally,  limited hip mobility, limited ankle mobility all bilaterally  LOWER EXTREMITY MMT:  Generally 3+ to 4-/5   FUNCTIONAL TESTS:  5 times sit to stand: 35.32 sec Timed up and go (TUG): 26.65 sec  GAIT: Distance walked: 30 feet Assistive device utilized: Single point cane Level of assistance: SBA Comments: slow/guarded    Special tests: Balance: Modified CTSIB Floor eo: normal Floor ec: 3 sed Pad eo: unable Pad ec: unable                                                                                                                            TREATMENT DATE:  08/22/2023  Nustep level 1 x 5 min (PT present with interpreter to discuss progress and pain level) Seated LAQ 2 x 10 with 2.5# each LE Seated march x 20 Sit to stand with overhead press for postural elongation, vc's for upright posture x 5 Seated fwd press with vc's for upright posture 2 x 5 Step up and hold onto balance pad x 10 each LE Standing shoulder rows with yellow tband with handles 2 x 10 (patient did 20) Standing shoulder extension with yellow tband with handles 2 x 10 (heavy vc's for correct technique- could not keep  her elbows straight) Standing shoulder ER with yellow tband 2 x 10 Standing shoulder horizontal abduction x 10 (heavy vc's for correct technique so patient likely did approx 20 reps)  08/09/2023 Patient was late to appointment NuStep Level 1 (no UE ) x 5 mins - PT present to discuss status Seated LAQ 2 x 10 bilateral 2# AW Standing march & hip abduction with 2# x 10 bilateral  Sit to stand 2 x 8 (with UE support) Standing shoulder row and extension with red TB x 15 each Hurdles (forwards & lateral) x 3 laps UE support at barre (needed a rest break in between) Airex step ups x 10 bilateral  Seated thoracic extension with small blue ball behind back 2 x 10  07/31/2023 Seated knee extension propping x 45 sec bilateral  Seated LAQ 2 x 10 bilateral  Sit to stand 2 x 8 (with UE support) Weight shifts on airex (M/L )x 1 min 20 sec each (patient could not make it to two minutes due to bilateral knee pain) Seated ball press 2 x 10 Standing shoulder row and extension with red TB x 12 each 4 inch step tap x 10 bilateral unilateral support at barre Seated thoracic extension with small blue ball x 10 Single leg flat cone tap forward and lateral x 5 each unilateral support at barre    PATIENT EDUCATION:  Education details: Initiated HEP and educated on home safety provided including grab bars, hand rails, proper assistive devices, and home medical equipment Person educated: Patient and Child(ren) Education method: Explanation, Demonstration, and Verbal cues Education comprehension: verbalized understanding, returned demonstration, verbal cues required, and tactile cues required  HOME EXERCISE PROGRAM: Access Code: [email or web address] URL: [email or web address] Date: 07/31/2023 Prepared by: Kristeen Sar  Exercises - Sit to Stand with Armchair  - 1 x daily - 7 x weekly - 2 sets -  5-8 reps - Seated Long Arc Quad  - 1 x daily - 7 x weekly - 2 sets - 10 reps - Seated Knee Extension Stretch with  Chair  - 1 x daily - 7 x weekly - 2 sets - 30s hold - Standing Shoulder Row with Anchored Resistance  - 1 x daily - 7 x weekly - 1 sets - 12 reps - Standing Shoulder Extension with Resistance  - 1 x daily - 7 x weekly - 1 sets - 12 reps ASSESSMENT:  CLINICAL IMPRESSION: Patient was able to tolerate increased activity in standing.  We worked on some dynamic standing balance, hip and leg strengthening as well as functional strengthening.  She needed seated rest breaks but was able to tolerate higher level exercise.  She continues to need mod vc's for upright posture.  Patient will benefit from skilled PT to address the below impairments and improve overall function.   OBJECTIVE IMPAIRMENTS: Abnormal gait, decreased balance, decreased endurance, decreased mobility, difficulty walking, decreased ROM, decreased strength, hypomobility, increased fascial restrictions, increased muscle spasms, impaired flexibility, improper body mechanics, postural dysfunction, and pain.   ACTIVITY LIMITATIONS: carrying, lifting, bending, standing, squatting, stairs, transfers, bed mobility, continence, bathing, toileting, dressing, hygiene/grooming, and caring for others  PARTICIPATION LIMITATIONS: meal prep, cleaning, laundry, shopping, community activity, and church  PERSONAL FACTORS: Age, Fitness, and 1-2 comorbidities: right THA, htn, diabetes are also affecting patient's functional outcome.   REHAB POTENTIAL: Fair due to severity of OA and contractures  CLINICAL DECISION MAKING: Evolving/moderate complexity  EVALUATION COMPLEXITY: Moderate   GOALS: Goals reviewed with patient? Yes  SHORT TERM GOALS: Target date: 08/23/2023  Patient to be independent with initial HEP Baseline: Goal status: INITIAL  2.  Pain report to be no greater than 4/10  Baseline:  Goal status: INITIAL  3.  No falls reported during first 4 weeks of PT  Baseline:  Goal status: INITIAL   LONG TERM GOALS: Target date:  09/20/2023  Patient to report pain no greater than 2/10  Baseline:  Goal status: INITIAL  2.  Patient to be independent with advanced HEP  Baseline:  Goal status: INITIAL  3.  Patient to be able to ascend and descend steps safely with proper sequence and assistive device Baseline:  Goal status: INITIAL  4.  Patient to be able to stand or walk for at least 300 feet without loss of balance using least restrictive assistive device Baseline:  Goal status: INITIAL  5.  Patient to be able to bend, stoop and squat safely to pick up objects from floor with no loss of balance using proper weight shifting strategies and UE assist   Baseline:  Goal status: INITIAL  6.  No falls for the 8 weeks of formal PT Baseline:  Goal status: INITIAL   PLAN:  PT FREQUENCY: 1-2x/week  PT DURATION: 8 weeks  PLANNED INTERVENTIONS: 97110-Therapeutic exercises, 97530- Therapeutic activity, [id number]- Neuromuscular re-education, 97535- Self Care, [id number]- Manual therapy, [phone]- Gait training, [phone]- Aquatic Therapy, [phone]- Electrical stimulation (unattended), [phone]- Electrical stimulation (manual), [id number]- Vasopneumatic device, Patient/Family education, Balance training, Stair training, Taping, Joint mobilization, Scar mobilization, Vestibular training, Visual/preceptual remediation/compensation, DME instructions, Cryotherapy, and Moist heat  PLAN FOR NEXT SESSION:  Nustep ;balance training and fall prevention, focus on upright posture and knee extension.   Delon B. Herman Fiero, PT 08/22/23 5:17 PM [facility] [address] [place], [place] [address] Phone # [phone] Fax [phone]

## 2023-08-24 ENCOUNTER — Ambulatory Visit

## 2023-08-24 DIAGNOSIS — R296 Repeated falls: Secondary | ICD-10-CM

## 2023-08-24 DIAGNOSIS — R293 Abnormal posture: Secondary | ICD-10-CM

## 2023-08-24 DIAGNOSIS — M6281 Muscle weakness (generalized): Secondary | ICD-10-CM

## 2023-08-24 DIAGNOSIS — R252 Cramp and spasm: Secondary | ICD-10-CM

## 2023-08-24 DIAGNOSIS — R262 Difficulty in walking, not elsewhere classified: Secondary | ICD-10-CM

## 2023-08-24 NOTE — Therapy (Unsigned)
 OUTPATIENT PHYSICAL THERAPY LOWER EXTREMITY TREATMENT   Patient Name: Norma Clay MRN: [id number] DOB:February 26, 1950, 73 y.o., female Today's Date: 08/25/2023  END OF SESSION:  PT End of Session - 08/24/23 1412     Visit Number 5    Number of Visits 16    Date for PT Re-Evaluation 09/20/23    Authorization Type [employer] approved 16 visits 07/26/2023 - 09/20/2023 [id number]    Authorization Time Period 07/26/2023 - 09/20/2023 [id number]    Authorization - Visit Number 5    Authorization - Number of Visits 16    Progress Note Due on Visit 10    PT Start Time 1407    PT Stop Time 1445    PT Time Calculation (min) 38 min    Activity Tolerance Patient tolerated treatment well    Behavior During Therapy [facility] for tasks assessed/performed            Past Medical History:  Diagnosis Date   Diabetes mellitus without complication (HCC)    Glaucoma    Hypertension    Osteoarthritis of right hip 05/16/2016   Posterior capsular opacification of both eyes, 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   Primary osteoarthritis of right hip 05/06/2020   Past Surgical History:  Procedure Laterality Date   ABDOMINAL HYSTERECTOMY     GLAUCOMA SURGERY     THYROIDECTOMY     TOTAL HIP ARTHROPLASTY Right 06/23/2020   Procedure: RIGHT TOTAL HIP ARTHROPLASTY ANTERIOR APPROACH;  Surgeon: Vernetta Lonni GRADE, MD;  Location: [facility] OR;  Service: Orthopedics;  Laterality: Right;   Patient Active Problem List   Diagnosis Date Noted   Stage 3a chronic kidney disease (HCC) 03/20/2023   Postsurgical hypothyroidism 12/26/2022   Mixed hyperlipidemia 08/04/2021   Gastroesophageal reflux disease 07/13/2021   Overweight (BMI 25.0-29.9) 07/13/2021   PAD (peripheral artery disease) (HCC) 12/14/2020   Pelvic pain 12/14/2020   Lower abdominal pain 12/14/2020   Uveitis of right eye 09/21/2020   Status post total replacement of right hip 06/23/2020   Diabetic neuropathy (HCC) 04/13/2020   Acquired hypothyroidism  12/16/2019   Hyperlipidemia associated with type 2 diabetes mellitus (HCC) 12/16/2019   Hammer toes of both feet 12/16/2019   Degenerative arthritis of lumbar spine 12/16/2019   Dysuria 11/22/2019   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   Essential hypertension 02/19/2019   Postsurgical states following surgery of eye and adnexa 10/01/2018   Pseudophakia of both eyes 04/23/2018   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, severe stage 04/23/2018   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, moderate stage 04/23/2018   Type 2 diabetes mellitus without complication, without long-term current use of insulin (HCC) 04/18/2016    PCP: Vicci Barnie NOVAK, MD  REFERRING PROVIDER: Vicci Barnie NOVAK, MD  REFERRING DIAG: [phone] (ICD-10-CM) - History of recent fall R26.9 (ICD-10-CM) - Gait disturbance  THERAPY DIAG:  Muscle weakness (generalized)  Difficulty in walking, not elsewhere classified  Cramp and spasm  Abnormal posture  Repeated falls  Rationale for Evaluation and Treatment: Rehabilitation  ONSET DATE: 07/20/2023  SUBJECTIVE:   SUBJECTIVE STATEMENT: Patient arrives with interpreter.  She reports she is doing better.  Feeling better     From Eval: Patient arrives with daughter and interpreter.  Dtr explains that patient has been falling more frequently at home and when she went to stay with her son in [place] .  She states she has pain in both ankles and both knees.  She had right THA in 2022.  She  lives with her dtr.  They have 4 steps to get into the home and approx 14 steps to get upstairs.  She has fallen in the bathroom and bedroom.  There is also a sunroom that has one step down where she fell one time as well.  She hopes to improve her strength and reduce fall risk.    PERTINENT HISTORY: Right THA 2022 PAIN:  08/24/23 Are you having pain? Yes: NPRS scale: 5/10 Pain location: ankles and knees Pain  description: aching Aggravating factors: walking Relieving factors: rest  PRECAUTIONS: Fall  RED FLAGS: None   WEIGHT BEARING RESTRICTIONS: No  FALLS:  Has patient fallen in last 6 months? Yes. Number of falls 4  LIVING ENVIRONMENT: Lives with: lives with their family Lives in: House/apartment Stairs: Yes: Internal: 14 steps; on right going up and External: 4 steps; on right going up Has following equipment at home: Single point cane  OCCUPATION: retired  PLOF: Independent, Independent with basic ADLs, Independent with household mobility with device, Independent with community mobility with device, Independent with homemaking with ambulation, Independent with gait, Independent with transfers, and Requires assistive device for independence  PATIENT GOALS: to stop falling and gain strength  NEXT MD VISIT: prn  OBJECTIVE:  Note: Objective measures were completed at Evaluation unless otherwise noted.  DIAGNOSTIC FINDINGS: na  PATIENT SURVEYS:  ABC scale: The Activities-Specific Balance Confidence (ABC) Scale 0% 10 20 30  40 50 60 70 80 90 100% No confidence<->completely confident  "How confident are you that you will not lose your balance or become unsteady when you . . .   Date tested 07/27/23  Walk around the house 90%  2. Walk up or down stairs 70%  3. Bend over and pick up a slipper from in front of a closet floor 50%  4. Reach for a small can off a shelf at eye level 70%  5. Stand on tip toes and reach for something above your head 70%  6. Stand on a chair and reach for something 0%  7. Sweep the floor 20%  8. Walk outside the house to a car parked in the driveway 70%  9. Get into or out of a car 70%  10. Walk across a parking lot to the mall 70%  11. Walk up or down a ramp 70%  12. Walk in a crowded mall where people rapidly walk past you 50%  13. Are bumped into by people as you walk through the mall 50%  14. Step onto or off of an escalator while you are holding  onto the railing 50%  15. Step onto or off an escalator while holding onto parcels such that you cannot hold onto the railing 20%  16. Walk outside on icy sidewalks 0%  Total: #/16 51.25%   Due to language barrier, these answers based on patient and family subjective reports.   COGNITION: Overall cognitive status: Within functional limits for tasks assessed     SENSATION: WFL   MUSCLE LENGTH: Hamstrings: Unable to test but obvious knee flexion contractures bilaterally R > L Thomas test: Unable to test but obvious limitations due to fwd trunk / bent over posture  POSTURE: rounded shoulders, forward head, decreased lumbar lordosis, increased thoracic kyphosis, posterior pelvic tilt, and flexed trunk   PALPATION: na  LOWER EXTREMITY ROM:  Lacking approx 25-30 knee extension bilaterally,  limited hip mobility, limited ankle mobility all bilaterally  LOWER EXTREMITY MMT:  Generally 3+ to 4-/5   FUNCTIONAL TESTS:  5 times sit to stand: 35.32 sec Timed up and go (TUG): 26.65 sec  GAIT: Distance walked: 30 feet Assistive device utilized: Single point cane Level of assistance: SBA Comments: slow/guarded    Special tests: Balance: Modified CTSIB Floor eo: normal Floor ec: 3 sed Pad eo: unable Pad ec: unable                                                                                                                            TREATMENT DATE:  08/24/2023  Nustep level 2 x 5 min (PT present with interpreter to discuss progress and pain level) Seated LAQ 2 x 10 with 4# each LE Seated march x 20 with 4# both LE Standing hip abd and extension x 10 each LE with 4# both Standing on balance pad: marching x 20 Standing on balance pad: mini squats 2 x 10 with cga Standing shoulder extension (PT anchoring) with yellow tband with handles x 20 Standing shoulder rows (PT anchoring) with yellow band with handles x 20 Seated bilateral shoulder ER 2 x 10 with yellow tband Seated  bilateral shoulder horizontal abduction  2 x 10 with yellow tband  08/22/2023  Nustep level 1 x 5 min (PT present with interpreter to discuss progress and pain level) Seated LAQ 2 x 10 with 2.5# each LE Seated march x 20 Sit to stand with overhead press for postural elongation, vc's for upright posture x 5 Seated fwd press with vc's for upright posture 2 x 5 Step up and hold onto balance pad x 10 each LE Standing shoulder rows with yellow tband with handles 2 x 10 (patient did 20) Standing shoulder extension with yellow tband with handles 2 x 10 (heavy vc's for correct technique- could not keep her elbows straight) Standing shoulder ER with yellow tband 2 x 10 Standing shoulder horizontal abduction x 10 (heavy vc's for correct technique so patient likely did approx 20 reps)  08/09/2023 Patient was late to appointment NuStep Level 1 (no UE ) x 5 mins - PT present to discuss status Seated LAQ 2 x 10 bilateral 2# AW Standing march & hip abduction with 2# x 10 bilateral  Sit to stand 2 x 8 (with UE support) Standing shoulder row and extension with red TB x 15 each Hurdles (forwards & lateral) x 3 laps UE support at barre (needed a rest break in between) Airex step ups x 10 bilateral  Seated thoracic extension with small blue ball behind back 2 x 10  PATIENT EDUCATION:  Education details: Initiated HEP and educated on home safety provided including grab bars, hand rails, proper assistive devices, and home medical equipment Person educated: Patient and Child(ren) Education method: Explanation, Demonstration, and Verbal cues Education comprehension: verbalized understanding, returned demonstration, verbal cues required, and tactile cues required  HOME EXERCISE PROGRAM: Access Code: [email or web address] URL: [email or web address] Date: 07/31/2023 Prepared by: Kristeen Sar  Exercises - Sit to Stand with Armchair  - 1 x daily -  7 x weekly - 2 sets - 5-8 reps - Seated Long Arc Quad  - 1 x  daily - 7 x weekly - 2 sets - 10 reps - Seated Knee Extension Stretch with Chair  - 1 x daily - 7 x weekly - 2 sets - 30s hold - Standing Shoulder Row with Anchored Resistance  - 1 x daily - 7 x weekly - 1 sets - 12 reps - Standing Shoulder Extension with Resistance  - 1 x daily - 7 x weekly - 1 sets - 12 reps ASSESSMENT:  CLINICAL IMPRESSION: Patient continues to show steady progress.  She takes multiple rest breaks t/o session to explain the same medical conditions to the interpreter.  She was able to tolerate increased resistance on nustep and with leg exercises today.  Continues to need mod vc's for upright posture.  Patient will benefit from skilled PT to address the below impairments and improve overall function.   OBJECTIVE IMPAIRMENTS: Abnormal gait, decreased balance, decreased endurance, decreased mobility, difficulty walking, decreased ROM, decreased strength, hypomobility, increased fascial restrictions, increased muscle spasms, impaired flexibility, improper body mechanics, postural dysfunction, and pain.   ACTIVITY LIMITATIONS: carrying, lifting, bending, standing, squatting, stairs, transfers, bed mobility, continence, bathing, toileting, dressing, hygiene/grooming, and caring for others  PARTICIPATION LIMITATIONS: meal prep, cleaning, laundry, shopping, community activity, and church  PERSONAL FACTORS: Age, Fitness, and 1-2 comorbidities: right THA, htn, diabetes are also affecting patient's functional outcome.   REHAB POTENTIAL: Fair due to severity of OA and contractures  CLINICAL DECISION MAKING: Evolving/moderate complexity  EVALUATION COMPLEXITY: Moderate   GOALS: Goals reviewed with patient? Yes  SHORT TERM GOALS: Target date: 08/23/2023  Patient to be independent with initial HEP Baseline: Goal status: INITIAL  2.  Pain report to be no greater than 4/10  Baseline:  Goal status: INITIAL  3.  No falls reported during first 4 weeks of PT  Baseline:  Goal  status: INITIAL   LONG TERM GOALS: Target date: 09/20/2023  Patient to report pain no greater than 2/10  Baseline:  Goal status: INITIAL  2.  Patient to be independent with advanced HEP  Baseline:  Goal status: INITIAL  3.  Patient to be able to ascend and descend steps safely with proper sequence and assistive device Baseline:  Goal status: INITIAL  4.  Patient to be able to stand or walk for at least 300 feet without loss of balance using least restrictive assistive device Baseline:  Goal status: INITIAL  5.  Patient to be able to bend, stoop and squat safely to pick up objects from floor with no loss of balance using proper weight shifting strategies and UE assist   Baseline:  Goal status: INITIAL  6.  No falls for the 8 weeks of formal PT Baseline:  Goal status: INITIAL   PLAN:  PT FREQUENCY: 1-2x/week  PT DURATION: 8 weeks  PLANNED INTERVENTIONS: 97110-Therapeutic exercises, 97530- Therapeutic activity, [id number]- Neuromuscular re-education, 97535- Self Care, [id number]- Manual therapy, [phone]- Gait training, [phone]- Aquatic Therapy, [phone]- Electrical stimulation (unattended), [phone]- Electrical stimulation (manual), [id number]- Vasopneumatic device, Patient/Family education, Balance training, Stair training, Taping, Joint mobilization, Scar mobilization, Vestibular training, Visual/preceptual remediation/compensation, DME instructions, Cryotherapy, and Moist heat  PLAN FOR NEXT SESSION:  Nustep ;balance training and fall prevention, focus on upright posture and knee extension.   Delon B. Arleatha Philipps, PT 08/25/23 7:21 AM [facility] [address], [address] [place], [place] [address] Phone # [phone] Fax [phone]

## 2023-08-29 ENCOUNTER — Ambulatory Visit: Admitting: Physical Therapy

## 2023-08-29 ENCOUNTER — Encounter: Payer: Self-pay | Admitting: Physical Therapy

## 2023-08-29 DIAGNOSIS — M6281 Muscle weakness (generalized): Secondary | ICD-10-CM

## 2023-08-29 DIAGNOSIS — R252 Cramp and spasm: Secondary | ICD-10-CM

## 2023-08-29 DIAGNOSIS — R262 Difficulty in walking, not elsewhere classified: Secondary | ICD-10-CM

## 2023-08-29 DIAGNOSIS — R293 Abnormal posture: Secondary | ICD-10-CM

## 2023-08-29 DIAGNOSIS — R296 Repeated falls: Secondary | ICD-10-CM | POA: Diagnosis not present

## 2023-08-29 NOTE — Therapy (Signed)
 OUTPATIENT PHYSICAL THERAPY LOWER EXTREMITY TREATMENT   Patient Name: Norma Clay MRN: [id number] DOB:Jan 06, 1950, 73 y.o., female Today's Date: 08/29/2023  END OF SESSION:  PT End of Session - 08/29/23 1316     Visit Number 6    Number of Visits 16    Date for PT Re-Evaluation 09/20/23    Authorization Type [employer] approved 16 visits 07/26/2023 - 09/20/2023 [id number]    Authorization Time Period 07/26/2023 - 09/20/2023 [id number]    Authorization - Visit Number 6    Authorization - Number of Visits 16    Progress Note Due on Visit 10    PT Start Time 1230    PT Stop Time 1314    PT Time Calculation (min) 44 min    Activity Tolerance Patient tolerated treatment well    Behavior During Therapy [facility] for tasks assessed/performed             Past Medical History:  Diagnosis Date   Diabetes mellitus without complication (HCC)    Glaucoma    Hypertension    Osteoarthritis of right hip 05/16/2016   Posterior capsular opacification of both eyes, 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   Primary osteoarthritis of right hip 05/06/2020   Past Surgical History:  Procedure Laterality Date   ABDOMINAL HYSTERECTOMY     GLAUCOMA SURGERY     THYROIDECTOMY     TOTAL HIP ARTHROPLASTY Right 06/23/2020   Procedure: RIGHT TOTAL HIP ARTHROPLASTY ANTERIOR APPROACH;  Surgeon: Vernetta Lonni GRADE, MD;  Location: [facility] OR;  Service: Orthopedics;  Laterality: Right;   Patient Active Problem List   Diagnosis Date Noted   Stage 3a chronic kidney disease (HCC) 03/20/2023   Postsurgical hypothyroidism 12/26/2022   Mixed hyperlipidemia 08/04/2021   Gastroesophageal reflux disease 07/13/2021   Overweight (BMI 25.0-29.9) 07/13/2021   PAD (peripheral artery disease) (HCC) 12/14/2020   Pelvic pain 12/14/2020   Lower abdominal pain 12/14/2020   Uveitis of right eye 09/21/2020   Status post total replacement of right hip 06/23/2020   Diabetic neuropathy (HCC) 04/13/2020   Acquired hypothyroidism  12/16/2019   Hyperlipidemia associated with type 2 diabetes mellitus (HCC) 12/16/2019   Hammer toes of both feet 12/16/2019   Degenerative arthritis of lumbar spine 12/16/2019   Dysuria 11/22/2019   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   Essential hypertension 02/19/2019   Postsurgical states following surgery of eye and adnexa 10/01/2018   Pseudophakia of both eyes 04/23/2018   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, severe stage 04/23/2018   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, moderate stage 04/23/2018   Type 2 diabetes mellitus without complication, without long-term current use of insulin (HCC) 04/18/2016    PCP: Vicci Barnie NOVAK, MD  REFERRING PROVIDER: Vicci Barnie NOVAK, MD  REFERRING DIAG: [phone] (ICD-10-CM) - History of recent fall R26.9 (ICD-10-CM) - Gait disturbance  THERAPY DIAG:  Muscle weakness (generalized)  Difficulty in walking, not elsewhere classified  Cramp and spasm  Abnormal posture  Repeated falls  Rationale for Evaluation and Treatment: Rehabilitation  ONSET DATE: 07/20/2023  SUBJECTIVE:   SUBJECTIVE STATEMENT: Patient reports she is okay today. She is having pain in her right knee. She wants to do laying down exercises for her back.  From Eval: Patient arrives with daughter and interpreter.  Dtr explains that patient has been falling more frequently at home and when she went to stay with her son in [place] .  She states she has pain in both ankles and both knees.  She had right THA in 2022.  She lives with her dtr.  They have 4 steps to get into the home and approx 14 steps to get upstairs.  She has fallen in the bathroom and bedroom.  There is also a sunroom that has one step down where she fell one time as well.  She hopes to improve her strength and reduce fall risk.    PERTINENT HISTORY: Right THA 2022 PAIN:  08/24/23 Are you having pain? Yes: NPRS scale: 5/10 Pain  location: ankles and knees Pain description: aching Aggravating factors: walking Relieving factors: rest  PRECAUTIONS: Fall  RED FLAGS: None   WEIGHT BEARING RESTRICTIONS: No  FALLS:  Has patient fallen in last 6 months? Yes. Number of falls 4  LIVING ENVIRONMENT: Lives with: lives with their family Lives in: House/apartment Stairs: Yes: Internal: 14 steps; on right going up and External: 4 steps; on right going up Has following equipment at home: Single point cane  OCCUPATION: retired  PLOF: Independent, Independent with basic ADLs, Independent with household mobility with device, Independent with community mobility with device, Independent with homemaking with ambulation, Independent with gait, Independent with transfers, and Requires assistive device for independence  PATIENT GOALS: to stop falling and gain strength  NEXT MD VISIT: prn  OBJECTIVE:  Note: Objective measures were completed at Evaluation unless otherwise noted.  DIAGNOSTIC FINDINGS: na  PATIENT SURVEYS:  ABC scale: The Activities-Specific Balance Confidence (ABC) Scale 0% 10 20 30  40 50 60 70 80 90 100% No confidence<->completely confident  "How confident are you that you will not lose your balance or become unsteady when you . . .   Date tested 07/27/23  Walk around the house 90%  2. Walk up or down stairs 70%  3. Bend over and pick up a slipper from in front of a closet floor 50%  4. Reach for a small can off a shelf at eye level 70%  5. Stand on tip toes and reach for something above your head 70%  6. Stand on a chair and reach for something 0%  7. Sweep the floor 20%  8. Walk outside the house to a car parked in the driveway 70%  9. Get into or out of a car 70%  10. Walk across a parking lot to the mall 70%  11. Walk up or down a ramp 70%  12. Walk in a crowded mall where people rapidly walk past you 50%  13. Are bumped into by people as you walk through the mall 50%  14. Step onto or off of  an escalator while you are holding onto the railing 50%  15. Step onto or off an escalator while holding onto parcels such that you cannot hold onto the railing 20%  16. Walk outside on icy sidewalks 0%  Total: #/16 51.25%   Due to language barrier, these answers based on patient and family subjective reports.   COGNITION: Overall cognitive status: Within functional limits for tasks assessed     SENSATION: WFL   MUSCLE LENGTH: Hamstrings: Unable to test but obvious knee flexion contractures bilaterally R > L Thomas test: Unable to test but obvious limitations due to fwd trunk / bent over posture  POSTURE: rounded shoulders, forward head, decreased lumbar lordosis, increased thoracic kyphosis, posterior pelvic tilt, and flexed trunk   PALPATION: na  LOWER EXTREMITY ROM:  Lacking approx 25-30 knee extension bilaterally,  limited hip mobility, limited ankle mobility all bilaterally  LOWER EXTREMITY MMT:  Generally  3+ to 4-/5   FUNCTIONAL TESTS:  5 times sit to stand: 35.32 sec Timed up and go (TUG): 26.65 sec  GAIT: Distance walked: 30 feet Assistive device utilized: Single point cane Level of assistance: SBA Comments: slow/guarded    Special tests: Balance: Modified CTSIB Floor eo: normal Floor ec: 3 sed Pad eo: unable Pad ec: unable                                                                                                                            TREATMENT DATE:  08/29/2023  Supine LTR x 8 each side Supine hamstring stretch with green strap 2 x 30 sec bilateral  Hooklying hip abduction with green TB 2 x 10 Hooklying ball squeeze x 20 Supine SLR 2 x 10 bilateral  Seated LAQ 2 x 10 with 4# each LE Seated march x 20 with 4# both LE Seated bilateral shoulder ER 2 x 10 with yellow tband     08/24/2023  Nustep level 2 x 5 min (PT present with interpreter to discuss progress and pain level) Seated LAQ 2 x 10 with 4# each LE Seated march x 20 with 4# both  LE Standing hip abd and extension x 10 each LE with 4# both Standing on balance pad: marching x 20 Standing on balance pad: mini squats 2 x 10 with cga Standing shoulder extension (PT anchoring) with yellow tband with handles x 20 Standing shoulder rows (PT anchoring) with yellow band with handles x 20 Seated bilateral shoulder ER 2 x 10 with yellow tband Seated bilateral shoulder horizontal abduction  2 x 10 with yellow tband  08/22/2023  Nustep level 1 x 5 min (PT present with interpreter to discuss progress and pain level) Seated LAQ 2 x 10 with 2.5# each LE Seated march x 20 Sit to stand with overhead press for postural elongation, vc's for upright posture x 5 Seated fwd press with vc's for upright posture 2 x 5 Step up and hold onto balance pad x 10 each LE Standing shoulder rows with yellow tband with handles 2 x 10 (patient did 20) Standing shoulder extension with yellow tband with handles 2 x 10 (heavy vc's for correct technique- could not keep her elbows straight) Standing shoulder ER with yellow tband 2 x 10 Standing shoulder horizontal abduction x 10 (heavy vc's for correct technique so patient likely did approx 20 reps)   PATIENT EDUCATION:  Education details: Initiated HEP and educated on home safety provided including grab bars, hand rails, proper assistive devices, and home medical equipment Person educated: Patient and Child(ren) Education method: Explanation, Demonstration, and Verbal cues Education comprehension: verbalized understanding, returned demonstration, verbal cues required, and tactile cues required  HOME EXERCISE PROGRAM: Access Code: [email or web address] URL: [email or web address] Date: 08/29/2023 Prepared by: Kristeen Sar  Exercises - Sit to Stand with Armchair  - 1 x daily - 7 x weekly - 2 sets - 5-8 reps - Seated Long Arc Quad  -  1 x daily - 7 x weekly - 2 sets - 10 reps - Seated Knee Extension Stretch with Chair  - 1 x daily - 7 x weekly - 2 sets  - 30s hold - Standing Shoulder Row with Anchored Resistance  - 1 x daily - 7 x weekly - 1 sets - 12 reps - Standing Shoulder Extension with Resistance  - 1 x daily - 7 x weekly - 1 sets - 12 reps - Supine Lower Trunk Rotation  - 1 x daily - 7 x weekly - 1 sets - 10 reps - Hooklying Clamshell with Resistance  - 1 x daily - 7 x weekly - 2 sets - 10 reps - Supine Hip Adduction Isometric with Ball  - 1 x daily - 7 x weekly - 2 sets - 10 reps - Active Straight Leg Raise with Quad Set  - 1 x daily - 7 x weekly - 2 sets - 10 reps ASSESSMENT:  CLINICAL IMPRESSION: Patient presents with increased right knee pain today. She wanted to incorporated more supine exercises for her back. She tolerated supine exercises well. With the help of the interpreter PT provided verbal and visual cues for correct exercise performance. Patient is making steady progress with skilled therapy. Updated HEP to include supine exercises performed today.Patient will benefit from skilled PT to address the below impairments and improve overall function.     OBJECTIVE IMPAIRMENTS: Abnormal gait, decreased balance, decreased endurance, decreased mobility, difficulty walking, decreased ROM, decreased strength, hypomobility, increased fascial restrictions, increased muscle spasms, impaired flexibility, improper body mechanics, postural dysfunction, and pain.   ACTIVITY LIMITATIONS: carrying, lifting, bending, standing, squatting, stairs, transfers, bed mobility, continence, bathing, toileting, dressing, hygiene/grooming, and caring for others  PARTICIPATION LIMITATIONS: meal prep, cleaning, laundry, shopping, community activity, and church  PERSONAL FACTORS: Age, Fitness, and 1-2 comorbidities: right THA, htn, diabetes are also affecting patient's functional outcome.   REHAB POTENTIAL: Fair due to severity of OA and contractures  CLINICAL DECISION MAKING: Evolving/moderate complexity  EVALUATION COMPLEXITY:  Moderate   GOALS: Goals reviewed with patient? Yes  SHORT TERM GOALS: Target date: 08/23/2023  Patient to be independent with initial HEP Baseline: Goal status: INITIAL  2.  Pain report to be no greater than 4/10  Baseline:  Goal status: INITIAL  3.  No falls reported during first 4 weeks of PT  Baseline:  Goal status: INITIAL   LONG TERM GOALS: Target date: 09/20/2023  Patient to report pain no greater than 2/10  Baseline:  Goal status: INITIAL  2.  Patient to be independent with advanced HEP  Baseline:  Goal status: INITIAL  3.  Patient to be able to ascend and descend steps safely with proper sequence and assistive device Baseline:  Goal status: INITIAL  4.  Patient to be able to stand or walk for at least 300 feet without loss of balance using least restrictive assistive device Baseline:  Goal status: INITIAL  5.  Patient to be able to bend, stoop and squat safely to pick up objects from floor with no loss of balance using proper weight shifting strategies and UE assist   Baseline:  Goal status: INITIAL  6.  No falls for the 8 weeks of formal PT Baseline:  Goal status: INITIAL   PLAN:  PT FREQUENCY: 1-2x/week  PT DURATION: 8 weeks  PLANNED INTERVENTIONS: 97110-Therapeutic exercises, 97530- Therapeutic activity, [id number]- Neuromuscular re-education, 97535- Self Care, [id number]- Manual therapy, [id number]- Gait training, [phone]- Aquatic Therapy, [phone]- Electrical stimulation (unattended), [phone]- Electrical  stimulation (manual), [id number]- Vasopneumatic device, Patient/Family education, Balance training, Stair training, Taping, Joint mobilization, Scar mobilization, Vestibular training, Visual/preceptual remediation/compensation, DME instructions, Cryotherapy, and Moist heat  PLAN FOR NEXT SESSION:  balance training and fall prevention, focus on upright posture and knee extension.   Kristeen Sar, PT 08/29/23 1:16 PM [facility] [address]  [address] [place], [place] [address] Phone # [phone] Fax [phone]

## 2023-08-31 ENCOUNTER — Ambulatory Visit

## 2023-08-31 DIAGNOSIS — R296 Repeated falls: Secondary | ICD-10-CM

## 2023-08-31 DIAGNOSIS — M6281 Muscle weakness (generalized): Secondary | ICD-10-CM

## 2023-08-31 DIAGNOSIS — R252 Cramp and spasm: Secondary | ICD-10-CM

## 2023-08-31 DIAGNOSIS — R293 Abnormal posture: Secondary | ICD-10-CM

## 2023-08-31 DIAGNOSIS — R262 Difficulty in walking, not elsewhere classified: Secondary | ICD-10-CM

## 2023-08-31 NOTE — Therapy (Signed)
 OUTPATIENT PHYSICAL THERAPY LOWER EXTREMITY TREATMENT   Patient Name: Norma Clay MRN: [id number] DOB:06-06-1950, 73 y.o., female Today's Date: 08/31/2023  END OF SESSION:  PT End of Session - 08/31/23 1244     Visit Number 7    Number of Visits 16    Date for PT Re-Evaluation 09/20/23    Authorization Type [employer] approved 16 visits 07/26/2023 - 09/20/2023 [id number]    Authorization - Visit Number 7    Authorization - Number of Visits 16    Progress Note Due on Visit 10    PT Start Time 1230    PT Stop Time 1310    PT Time Calculation (min) 40 min    Activity Tolerance Patient tolerated treatment well    Behavior During Therapy [facility] for tasks assessed/performed             Past Medical History:  Diagnosis Date   Diabetes mellitus without complication (HCC)    Glaucoma    Hypertension    Osteoarthritis of right hip 05/16/2016   Posterior capsular opacification of both eyes, 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   Primary osteoarthritis of right hip 05/06/2020   Past Surgical History:  Procedure Laterality Date   ABDOMINAL HYSTERECTOMY     GLAUCOMA SURGERY     THYROIDECTOMY     TOTAL HIP ARTHROPLASTY Right 06/23/2020   Procedure: RIGHT TOTAL HIP ARTHROPLASTY ANTERIOR APPROACH;  Surgeon: Vernetta Lonni GRADE, MD;  Location: [facility] OR;  Service: Orthopedics;  Laterality: Right;   Patient Active Problem List   Diagnosis Date Noted   Stage 3a chronic kidney disease (HCC) 03/20/2023   Postsurgical hypothyroidism 12/26/2022   Mixed hyperlipidemia 08/04/2021   Gastroesophageal reflux disease 07/13/2021   Overweight (BMI 25.0-29.9) 07/13/2021   PAD (peripheral artery disease) (HCC) 12/14/2020   Pelvic pain 12/14/2020   Lower abdominal pain 12/14/2020   Uveitis of right eye 09/21/2020   Status post total replacement of right hip 06/23/2020   Diabetic neuropathy (HCC) 04/13/2020   Acquired hypothyroidism 12/16/2019   Hyperlipidemia associated with type 2 diabetes mellitus (HCC)  12/16/2019   Hammer toes of both feet 12/16/2019   Degenerative arthritis of lumbar spine 12/16/2019   Dysuria 11/22/2019   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   Essential hypertension 02/19/2019   Postsurgical states following surgery of eye and adnexa 10/01/2018   Pseudophakia of both eyes 04/23/2018   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, severe stage 04/23/2018   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, moderate stage 04/23/2018   Type 2 diabetes mellitus without complication, without long-term current use of insulin (HCC) 04/18/2016    PCP: Vicci Barnie NOVAK, MD  REFERRING PROVIDER: Vicci Barnie NOVAK, MD  REFERRING DIAG: [phone] (ICD-10-CM) - History of recent fall R26.9 (ICD-10-CM) - Gait disturbance  THERAPY DIAG:  Difficulty in walking, not elsewhere classified  Muscle weakness (generalized)  Abnormal posture  Cramp and spasm  Repeated falls  Rationale for Evaluation and Treatment: Rehabilitation  ONSET DATE: 07/20/2023  SUBJECTIVE:   SUBJECTIVE STATEMENT: Patient reports she is better today.   From Eval: Patient arrives with daughter and interpreter.  Dtr explains that patient has been falling more frequently at home and when she went to stay with her son in [place] .  She states she has pain in both ankles and both knees.  She had right THA in 2022.  She lives with her dtr.  They have 4 steps to get into the home and approx 14 steps to  get upstairs.  She has fallen in the bathroom and bedroom.  There is also a sunroom that has one step down where she fell one time as well.  She hopes to improve her strength and reduce fall risk.    PERTINENT HISTORY: Right THA 2022 PAIN:  08/31/23 Are you having pain? Yes: NPRS scale: 4/10 Pain location: ankles and knees Pain description: aching Aggravating factors: walking Relieving factors: rest  PRECAUTIONS: Fall  RED FLAGS: None   WEIGHT BEARING  RESTRICTIONS: No  FALLS:  Has patient fallen in last 6 months? Yes. Number of falls 4  LIVING ENVIRONMENT: Lives with: lives with their family Lives in: House/apartment Stairs: Yes: Internal: 14 steps; on right going up and External: 4 steps; on right going up Has following equipment at home: Single point cane  OCCUPATION: retired  PLOF: Independent, Independent with basic ADLs, Independent with household mobility with device, Independent with community mobility with device, Independent with homemaking with ambulation, Independent with gait, Independent with transfers, and Requires assistive device for independence  PATIENT GOALS: to stop falling and gain strength  NEXT MD VISIT: prn  OBJECTIVE:  Note: Objective measures were completed at Evaluation unless otherwise noted.  DIAGNOSTIC FINDINGS: na  PATIENT SURVEYS:  ABC scale: The Activities-Specific Balance Confidence (ABC) Scale 0% 10 20 30  40 50 60 70 80 90 100% No confidence<->completely confident  "How confident are you that you will not lose your balance or become unsteady when you . . .   Date tested 07/27/23  Walk around the house 90%  2. Walk up or down stairs 70%  3. Bend over and pick up a slipper from in front of a closet floor 50%  4. Reach for a small can off a shelf at eye level 70%  5. Stand on tip toes and reach for something above your head 70%  6. Stand on a chair and reach for something 0%  7. Sweep the floor 20%  8. Walk outside the house to a car parked in the driveway 70%  9. Get into or out of a car 70%  10. Walk across a parking lot to the mall 70%  11. Walk up or down a ramp 70%  12. Walk in a crowded mall where people rapidly walk past you 50%  13. Are bumped into by people as you walk through the mall 50%  14. Step onto or off of an escalator while you are holding onto the railing 50%  15. Step onto or off an escalator while holding onto parcels such that you cannot hold onto the railing 20%   16. Walk outside on icy sidewalks 0%  Total: #/16 51.25%   Due to language barrier, these answers based on patient and family subjective reports.   COGNITION: Overall cognitive status: Within functional limits for tasks assessed     SENSATION: WFL   MUSCLE LENGTH: Hamstrings: Unable to test but obvious knee flexion contractures bilaterally R > L Thomas test: Unable to test but obvious limitations due to fwd trunk / bent over posture  POSTURE: rounded shoulders, forward head, decreased lumbar lordosis, increased thoracic kyphosis, posterior pelvic tilt, and flexed trunk   PALPATION: na  LOWER EXTREMITY ROM:  Lacking approx 25-30 knee extension bilaterally,  limited hip mobility, limited ankle mobility all bilaterally  LOWER EXTREMITY MMT:  Generally 3+ to 4-/5   FUNCTIONAL TESTS:  5 times sit to stand: 35.32 sec Timed up and go (TUG): 26.65 sec  GAIT: Distance walked: 30  feet Assistive device utilized: Single point cane Level of assistance: SBA Comments: slow/guarded    Special tests: Balance: Modified CTSIB Floor eo: normal Floor ec: 3 sed Pad eo: unable Pad ec: unable                                                                                                                            TREATMENT DATE:  08/31/2023  Standing hamstring stretch x 3 each LE holding 10 sec at steps Heel to toe sway with pushing into upright posture while holding onto stair railing Standing hip extension x 10 each LE Seated LAQ 2 x 10 with 2 lbs Seated march x 20 Standing shoulder extension with PT anchoring band with handles 2 x 10 yellow Standing shoulder rows with PT anchoring band with handles 2 x 10 yellow Seated bilateral shoulder ER with yellow tband 2 x 10 Seated bilateral shoulder horizontal abduction with yellow tband 2 x 10  08/29/2023  Supine LTR x 8 each side Supine hamstring stretch with green strap 2 x 30 sec bilateral  Hooklying hip abduction with green TB 2  x 10 Hooklying ball squeeze x 20 Supine SLR 2 x 10 bilateral  Seated LAQ 2 x 10 with 4# each LE Seated march x 20 with 4# both LE Seated bilateral shoulder ER 2 x 10 with yellow tband  08/24/2023  Nustep level 2 x 5 min (PT present with interpreter to discuss progress and pain level) Seated LAQ 2 x 10 with 4# each LE Seated march x 20 with 4# both LE Standing hip abd and extension x 10 each LE with 4# both Standing on balance pad: marching x 20 Standing on balance pad: mini squats 2 x 10 with cga Standing shoulder extension (PT anchoring) with yellow tband with handles x 20 Standing shoulder rows (PT anchoring) with yellow band with handles x 20 Seated bilateral shoulder ER 2 x 10 with yellow tband Seated bilateral shoulder horizontal abduction  2 x 10 with yellow tband  PATIENT EDUCATION:  Education details: Initiated HEP and educated on home safety provided including grab bars, hand rails, proper assistive devices, and home medical equipment Person educated: Patient and Child(ren) Education method: Explanation, Demonstration, and Verbal cues Education comprehension: verbalized understanding, returned demonstration, verbal cues required, and tactile cues required  HOME EXERCISE PROGRAM: Access Code: [email or web address] URL: [email or web address] Date: 08/29/2023 Prepared by: Kristeen Sar  Exercises - Sit to Stand with Armchair  - 1 x daily - 7 x weekly - 2 sets - 5-8 reps - Seated Long Arc Quad  - 1 x daily - 7 x weekly - 2 sets - 10 reps - Seated Knee Extension Stretch with Chair  - 1 x daily - 7 x weekly - 2 sets - 30s hold - Standing Shoulder Row with Anchored Resistance  - 1 x daily - 7 x weekly - 1 sets - 12 reps - Standing Shoulder Extension with Resistance  - 1 x daily -  7 x weekly - 1 sets - 12 reps - Supine Lower Trunk Rotation  - 1 x daily - 7 x weekly - 1 sets - 10 reps - Hooklying Clamshell with Resistance  - 1 x daily - 7 x weekly - 2 sets - 10 reps - Supine Hip  Adduction Isometric with Ball  - 1 x daily - 7 x weekly - 2 sets - 10 reps - Active Straight Leg Raise with Quad Set  - 1 x daily - 7 x weekly - 2 sets - 10 reps  ASSESSMENT:  CLINICAL IMPRESSION: Patient arrives with interpreter.  She reports doing ok. We focused on postural elongation exercises.  She requires frequent rest breaks (between every exercises, she requires about 1-2 full minutes) We did more standing exercises today which were challenging for her.  Patient is making steady progress with skilled therapy despite admittedly not doing her HEP.  We discussed breaking her exercises into groups and doing just a few each day so that she isn't so overwhelmed.  Patient will benefit from skilled PT to address the below impairments and improve overall function.   OBJECTIVE IMPAIRMENTS: Abnormal gait, decreased balance, decreased endurance, decreased mobility, difficulty walking, decreased ROM, decreased strength, hypomobility, increased fascial restrictions, increased muscle spasms, impaired flexibility, improper body mechanics, postural dysfunction, and pain.   ACTIVITY LIMITATIONS: carrying, lifting, bending, standing, squatting, stairs, transfers, bed mobility, continence, bathing, toileting, dressing, hygiene/grooming, and caring for others  PARTICIPATION LIMITATIONS: meal prep, cleaning, laundry, shopping, community activity, and church  PERSONAL FACTORS: Age, Fitness, and 1-2 comorbidities: right THA, htn, diabetes are also affecting patient's functional outcome.   REHAB POTENTIAL: Fair due to severity of OA and contractures  CLINICAL DECISION MAKING: Evolving/moderate complexity  EVALUATION COMPLEXITY: Moderate   GOALS: Goals reviewed with patient? Yes  SHORT TERM GOALS: Target date: 08/23/2023  Patient to be independent with initial HEP Baseline: Goal status: In Progress (patient states she is not doing the HEP)  2.  Pain report to be no greater than 4/10  Baseline:   Goal status: In Progress  3.  No falls reported during first 4 weeks of PT  Baseline:  Goal status: MET 08/31/23   LONG TERM GOALS: Target date: 09/20/2023  Patient to report pain no greater than 2/10  Baseline:  Goal status: INITIAL  2.  Patient to be independent with advanced HEP  Baseline:  Goal status: INITIAL  3.  Patient to be able to ascend and descend steps safely with proper sequence and assistive device Baseline:  Goal status: INITIAL  4.  Patient to be able to stand or walk for at least 300 feet without loss of balance using least restrictive assistive device Baseline:  Goal status: INITIAL  5.  Patient to be able to bend, stoop and squat safely to pick up objects from floor with no loss of balance using proper weight shifting strategies and UE assist   Baseline:  Goal status: INITIAL  6.  No falls for the 8 weeks of formal PT Baseline:  Goal status: INITIAL   PLAN:  PT FREQUENCY: 1-2x/week  PT DURATION: 8 weeks  PLANNED INTERVENTIONS: 97110-Therapeutic exercises, 97530- Therapeutic activity, [id number]- Neuromuscular re-education, 97535- Self Care, [id number]- Manual therapy, [phone]- Gait training, [phone]- Aquatic Therapy, [phone]- Electrical stimulation (unattended), [phone]- Electrical stimulation (manual), [id number]- Vasopneumatic device, Patient/Family education, Balance training, Stair training, Taping, Joint mobilization, Scar mobilization, Vestibular training, Visual/preceptual remediation/compensation, DME instructions, Cryotherapy, and Moist heat  PLAN FOR NEXT SESSION:  Postural elongation, balance  training and fall prevention, focus on upright posture and knee extension.   Delon B. Trevione Wert, PT 08/31/23 1:21 PM [facility] [address], [address] [place], [place] [address] Phone # [phone] Fax [phone]

## 2023-09-15 ENCOUNTER — Other Ambulatory Visit: Payer: Self-pay | Admitting: Internal Medicine

## 2023-09-15 NOTE — Telephone Encounter (Signed)
 Requested medications are due for refill today.  yes  Requested medications are on the active medications list.  yes  Last refill. 06/19/2023 #90   Future visit scheduled.   yes  Notes to clinic.  Please review and update sig.    Requested Prescriptions  Pending Prescriptions Disp Refills   ferrous sulfate  325 (65 FE) MG tablet [Pharmacy Med Name: FERROUS SULFATE  325 MG TABLET] 90 tablet 0    Sig: 1 TAB DAILY X 1 MONTH, THEN 1 TAB ON M/W/F     Endocrinology:  Minerals - Iron Supplementation Passed - 09/15/2023  2:20 PM      Passed - HGB in normal range and within 360 days    Hemoglobin  Date Value Ref Range Status  07/20/2023 12.5 11.1 - 15.9 g/dL Final         Passed - HCT in normal range and within 360 days    Hematocrit  Date Value Ref Range Status  07/20/2023 39.8 34.0 - 46.6 % Final         Passed - RBC in normal range and within 360 days    RBC  Date Value Ref Range Status  07/20/2023 4.05 3.77 - 5.28 x10E6/uL Final  06/24/2020 3.43 (L) 3.87 - 5.11 MIL/uL Final         Passed - Fe (serum) in normal range and within 360 days    Iron  Date Value Ref Range Status  07/20/2023 64 27 - 139 ug/dL Final   Iron Saturation  Date Value Ref Range Status  07/20/2023 18 15 - 55 % Final         Passed - Ferritin in normal range and within 360 days    Ferritin  Date Value Ref Range Status  07/20/2023 25 15 - 150 ng/mL Final         Passed - Valid encounter within last 12 months    Recent Outpatient Visits           1 month ago Type 2 diabetes mellitus without complication, without long-term current use of insulin (HCC)   [facility] [place]. [facility] Vicci Sober B, MD   4 months ago Type 2 diabetes mellitus without complication, without long-term current use of insulin (HCC)   [facility] Of [place]. [facility] Fleeta Morris, [place] L, RPH-CPP   5 months ago Type 2 diabetes  mellitus with diabetic polyneuropathy, without long-term current use of insulin (HCC)   [facility] Dept Of [place]. [facility] Vicci Sober NOVAK, MD   10 months ago Annual physical exam   [facility] Of [place]. [facility] Vicci Sober NOVAK, MD   1 year ago Chronic cough   [facility] - [facility] Dept Of [place]. [facility] Theotis Haze ORN, NP       Future Appointments             In 2 months Vicci Sober NOVAK, MD [facility] - A Dept Of [place]. [facility] [place]

## 2023-09-19 ENCOUNTER — Ambulatory Visit: Attending: Internal Medicine

## 2023-09-19 DIAGNOSIS — R296 Repeated falls: Secondary | ICD-10-CM | POA: Diagnosis present

## 2023-09-19 DIAGNOSIS — M6281 Muscle weakness (generalized): Secondary | ICD-10-CM | POA: Diagnosis present

## 2023-09-19 DIAGNOSIS — R252 Cramp and spasm: Secondary | ICD-10-CM | POA: Insufficient documentation

## 2023-09-19 DIAGNOSIS — R262 Difficulty in walking, not elsewhere classified: Secondary | ICD-10-CM | POA: Insufficient documentation

## 2023-09-19 DIAGNOSIS — R293 Abnormal posture: Secondary | ICD-10-CM | POA: Diagnosis present

## 2023-09-19 NOTE — Therapy (Signed)
 OUTPATIENT PHYSICAL THERAPY LOWER EXTREMITY TREATMENT PHYSICAL THERAPY DISCHARGE SUMMARY  Visits from Start of Care: 8  Current functional level related to goals / functional outcomes: See below   Remaining deficits: See below   Education / Equipment: See below   Patient agrees to discharge. Patient goals were met. Patient is being discharged due to meeting the stated rehab goals.    Patient Name: Norma Clay MRN: [id number] DOB:[birth year], 73 y.o., female Today's Date: 09/19/2023  END OF SESSION:  PT End of Session - 09/19/23 1642     Visit Number 8    Date for PT Re-Evaluation 09/20/23    Authorization Type [employer] approved 16 visits 07/26/2023 - 09/20/2023 [id number]    Authorization Time Period 07/26/2023 - 09/20/2023 [id number]    Authorization - Visit Number 8    Authorization - Number of Visits 16    Progress Note Due on Visit 10    PT Start Time 1633    PT Stop Time 1656    PT Time Calculation (min) 23 min    Activity Tolerance Patient tolerated treatment well    Behavior During Therapy [facility] for tasks assessed/performed             Past Medical History:  Diagnosis Date   Diabetes mellitus without complication (HCC)    Glaucoma    Hypertension    Osteoarthritis of right hip 05/16/2016   Posterior capsular opacification of both eyes, 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   Primary osteoarthritis of right hip 05/06/2020   Past Surgical History:  Procedure Laterality Date   ABDOMINAL HYSTERECTOMY     GLAUCOMA SURGERY     THYROIDECTOMY     TOTAL HIP ARTHROPLASTY Right 06/23/2020   Procedure: RIGHT TOTAL HIP ARTHROPLASTY ANTERIOR APPROACH;  Surgeon: Vernetta Lonni GRADE, MD;  Location: [facility] OR;  Service: Orthopedics;  Laterality: Right;   Patient Active Problem List   Diagnosis Date Noted   Stage 3a chronic kidney disease (HCC) 03/20/2023   Postsurgical hypothyroidism 12/26/2022   Mixed hyperlipidemia 08/04/2021   Gastroesophageal reflux disease  07/13/2021   Overweight (BMI 25.0-29.9) 07/13/2021   PAD (peripheral artery disease) (HCC) 12/14/2020   Pelvic pain 12/14/2020   Lower abdominal pain 12/14/2020   Uveitis of right eye 09/21/2020   Status post total replacement of right hip 06/23/2020   Diabetic neuropathy (HCC) 04/13/2020   Acquired hypothyroidism 12/16/2019   Hyperlipidemia associated with type 2 diabetes mellitus (HCC) 12/16/2019   Hammer toes of both feet 12/16/2019   Degenerative arthritis of lumbar spine 12/16/2019   Dysuria 11/22/2019   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   Essential hypertension 02/19/2019   Postsurgical states following surgery of eye and adnexa 10/01/2018   Pseudophakia of both eyes 04/23/2018   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, severe stage 04/23/2018   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, moderate stage 04/23/2018   Type 2 diabetes mellitus without complication, without long-term current use of insulin (HCC) 04/18/2016    PCP: Vicci Barnie NOVAK, MD  REFERRING PROVIDER: Vicci Barnie NOVAK, MD  REFERRING DIAG: [phone] (ICD-10-CM) - History of recent fall R26.9 (ICD-10-CM) - Gait disturbance  THERAPY DIAG:  Repeated falls  Difficulty in walking, not elsewhere classified  Muscle weakness (generalized)  Abnormal posture  Cramp and spasm  Rationale for Evaluation and Treatment: Rehabilitation  ONSET DATE: 07/20/2023  SUBJECTIVE:   SUBJECTIVE STATEMENT: Patient reports she is doing good. Doing all that I did before  From Eval: Patient arrives with  daughter and interpreter.  Dtr explains that patient has been falling more frequently at home and when she went to stay with her son in [place] .  She states she has pain in both ankles and both knees.  She had right THA in 2022.  She lives with her dtr.  They have 4 steps to get into the home and approx 14 steps to get upstairs.  She has fallen in the bathroom  and bedroom.  There is also a sunroom that has one step down where she fell one time as well.  She hopes to improve her strength and reduce fall risk.    PERTINENT HISTORY: Right THA 2022 PAIN:  09/19/23 Are you having pain? Yes: NPRS scale: 2/10 Pain location: ankles and knees Pain description: aching Aggravating factors: walking Relieving factors: rest  PRECAUTIONS: Fall  RED FLAGS: None   WEIGHT BEARING RESTRICTIONS: No  FALLS:  Has patient fallen in last 6 months? Yes. Number of falls 4  LIVING ENVIRONMENT: Lives with: lives with their family Lives in: House/apartment Stairs: Yes: Internal: 14 steps; on right going up and External: 4 steps; on right going up Has following equipment at home: Single point cane  OCCUPATION: retired  PLOF: Independent, Independent with basic ADLs, Independent with household mobility with device, Independent with community mobility with device, Independent with homemaking with ambulation, Independent with gait, Independent with transfers, and Requires assistive device for independence  PATIENT GOALS: to stop falling and gain strength  NEXT MD VISIT: prn  OBJECTIVE:  Note: Objective measures were completed at Evaluation unless otherwise noted.  DIAGNOSTIC FINDINGS: na  PATIENT SURVEYS:  ABC scale: The Activities-Specific Balance Confidence (ABC) Scale 0% 10 20 30  40 50 60 70 80 90 100% No confidence<->completely confident  "How confident are you that you will not lose your balance or become unsteady when you . . .   Date tested 07/27/23 09/19/23  Walk around the house 90% 100  2. Walk up or down stairs 70% 80  3. Bend over and pick up a slipper from in front of a closet floor 50% 70  4. Reach for a small can off a shelf at eye level 70% 100  5. Stand on tip toes and reach for something above your head 70% 70  6. Stand on a chair and reach for something 0% 0  7. Sweep the floor 20% 50  8. Walk outside the house to a car parked in the  driveway 70% 100  9. Get into or out of a car 70% 100  10. Walk across a parking lot to the mall 70% 100  11. Walk up or down a ramp 70% 80  12. Walk in a crowded mall where people rapidly walk past you 50% 50  13. Are bumped into by people as you walk through the mall 50% 50  14. Step onto or off of an escalator while you are holding onto the railing 50% 50  15. Step onto or off an escalator while holding onto parcels such that you cannot hold onto the railing 20% 20  16. Walk outside on icy sidewalks 0% 0  Total: #/16 51.25% 63.75%   Due to language barrier, these answers based on patient and family subjective reports.   COGNITION: Overall cognitive status: Within functional limits for tasks assessed     SENSATION: WFL   MUSCLE LENGTH: Hamstrings: Unable to test but obvious knee flexion contractures bilaterally R > L Thomas test: Unable to test but obvious  limitations due to fwd trunk / bent over posture  POSTURE: rounded shoulders, forward head, decreased lumbar lordosis, increased thoracic kyphosis, posterior pelvic tilt, and flexed trunk   PALPATION: na  LOWER EXTREMITY ROM:  Lacking approx 25-30 knee extension bilaterally,  limited hip mobility, limited ankle mobility all bilaterally  LOWER EXTREMITY MMT:  Initial eval: Generally 3+ to 4-/5  09/19/23: Generally 4- to 4+/5   FUNCTIONAL TESTS:  Initial eval: 5 times sit to stand: 35.32 sec Timed up and go (TUG): 26.65 sec  09/19/23 5 times sit to stand: 23.32 sec Timed up and go (TUG): 15.35 sec  GAIT: Distance walked: 30 feet Assistive device utilized: Single point cane Level of assistance: SBA Comments: slow/guarded    Special tests: Balance: Modified CTSIB Floor eo: normal Floor ec: 3 sed Pad eo: unable Pad ec: unable                                                                                                                            TREATMENT DATE:  09/19/23  Patient was 18 min late Nustep x  5 min level 5 DC assessment completed Reviewed all of HEP and provided DC plan for how to progress  08/31/2023  Standing hamstring stretch x 3 each LE holding 10 sec at steps Heel to toe sway with pushing into upright posture while holding onto stair railing Standing hip extension x 10 each LE Seated LAQ 2 x 10 with 2 lbs Seated march x 20 Standing shoulder extension with PT anchoring band with handles 2 x 10 yellow Standing shoulder rows with PT anchoring band with handles 2 x 10 yellow Seated bilateral shoulder ER with yellow tband 2 x 10 Seated bilateral shoulder horizontal abduction with yellow tband 2 x 10  08/29/2023  Supine LTR x 8 each side Supine hamstring stretch with green strap 2 x 30 sec bilateral  Hooklying hip abduction with green TB 2 x 10 Hooklying ball squeeze x 20 Supine SLR 2 x 10 bilateral  Seated LAQ 2 x 10 with 4# each LE Seated march x 20 with 4# both LE Seated bilateral shoulder ER 2 x 10 with yellow tband   PATIENT EDUCATION:  Education details: Initiated HEP and educated on home safety provided including grab bars, hand rails, proper assistive devices, and home medical equipment Person educated: Patient and Child(ren) Education method: Explanation, Demonstration, and Verbal cues Education comprehension: verbalized understanding, returned demonstration, verbal cues required, and tactile cues required  HOME EXERCISE PROGRAM: Access Code: [email or web address] URL: [email or web address] Date: 08/29/2023 Prepared by: Kristeen Sar  Exercises - Sit to Stand with Armchair  - 1 x daily - 7 x weekly - 2 sets - 5-8 reps - Seated Long Arc Quad  - 1 x daily - 7 x weekly - 2 sets - 10 reps - Seated Knee Extension Stretch with Chair  - 1 x daily - 7 x weekly - 2 sets - 30s hold - Standing Shoulder Row with  Anchored Resistance  - 1 x daily - 7 x weekly - 1 sets - 12 reps - Standing Shoulder Extension with Resistance  - 1 x daily - 7 x weekly - 1 sets - 12 reps -  Supine Lower Trunk Rotation  - 1 x daily - 7 x weekly - 1 sets - 10 reps - Hooklying Clamshell with Resistance  - 1 x daily - 7 x weekly - 2 sets - 10 reps - Supine Hip Adduction Isometric with Ball  - 1 x daily - 7 x weekly - 2 sets - 10 reps - Active Straight Leg Raise with Quad Set  - 1 x daily - 7 x weekly - 2 sets - 10 reps  ASSESSMENT:  CLINICAL IMPRESSION: Patient arrives 18 min late today.  Given option to reschedule but was her last visit on current cert period. She states she is functioning at her PLOF and min pain.  She is walking without the cane a lot more and now able to go outside with her cane.  She feels she is doing well and would like to be discharged today.  Objective findings all improved.  She was able to demonstrate HEP with verbal cues correctly.  Daughter helps her at home.  She has met all goals.  We will DC at this time.     OBJECTIVE IMPAIRMENTS: Abnormal gait, decreased balance, decreased endurance, decreased mobility, difficulty walking, decreased ROM, decreased strength, hypomobility, increased fascial restrictions, increased muscle spasms, impaired flexibility, improper body mechanics, postural dysfunction, and pain.   ACTIVITY LIMITATIONS: carrying, lifting, bending, standing, squatting, stairs, transfers, bed mobility, continence, bathing, toileting, dressing, hygiene/grooming, and caring for others  PARTICIPATION LIMITATIONS: meal prep, cleaning, laundry, shopping, community activity, and church  PERSONAL FACTORS: Age, Fitness, and 1-2 comorbidities: right THA, htn, diabetes are also affecting patient's functional outcome.   REHAB POTENTIAL: Fair due to severity of OA and contractures  CLINICAL DECISION MAKING: Evolving/moderate complexity  EVALUATION COMPLEXITY: Moderate   GOALS: Goals reviewed with patient? Yes  SHORT TERM GOALS: Target date: 08/23/2023  Patient to be independent with initial HEP Baseline: Goal status: MET   2.  Pain report to be  no greater than 4/10  Baseline:  Goal status: MET  3.  No falls reported during first 4 weeks of PT  Baseline:  Goal status: MET 08/31/23   LONG TERM GOALS: Target date: 09/20/2023  Patient to report pain no greater than 2/10  Baseline:  Goal status: NOT MET  2.  Patient to be independent with advanced HEP  Baseline:  Goal status: Partially MET  3.  Patient to be able to ascend and descend steps safely with proper sequence and assistive device Baseline:  Goal status: MET  4.  Patient to be able to stand or walk for at least 300 feet without loss of balance using least restrictive assistive device Baseline:  Goal status: MET 09/19/23  5.  Patient to be able to bend, stoop and squat safely to pick up objects from floor with no loss of balance using proper weight shifting strategies and UE assist   Baseline:  Goal status: MET  6.  No falls for the 8 weeks of formal PT Baseline:  Goal status: MET   PLAN:  PT FREQUENCY: 1-2x/week  PT DURATION: 8 weeks  PLANNED INTERVENTIONS: 97110-Therapeutic exercises, 97530- Therapeutic activity, [id number]- Neuromuscular re-education, 97535- Self Care, [id number]- Manual therapy, [id number]- Gait training, [phone]- Aquatic Therapy, [phone]- Electrical stimulation (unattended), [id number]- Electrical stimulation (manual), [id number]- Vasopneumatic  device, Patient/Family education, Balance training, Stair training, Taping, Joint mobilization, Scar mobilization, Vestibular training, Visual/preceptual remediation/compensation, DME instructions, Cryotherapy, and Moist heat  PLAN FOR NEXT SESSION:  DC  Jalysa Swopes B. Giancarlo Askren, PT 09/19/23 5:14 PM [facility] [address], [address] [place], [place] [address] Phone # [phone] Fax [phone]

## 2023-09-25 ENCOUNTER — Other Ambulatory Visit: Payer: Self-pay | Admitting: Internal Medicine

## 2023-09-25 ENCOUNTER — Ambulatory Visit: Admitting: Orthopaedic Surgery

## 2023-09-25 DIAGNOSIS — J301 Allergic rhinitis due to pollen: Secondary | ICD-10-CM

## 2023-09-25 DIAGNOSIS — E1169 Type 2 diabetes mellitus with other specified complication: Secondary | ICD-10-CM

## 2023-09-26 NOTE — Telephone Encounter (Signed)
 Too soon for refill for atorvastatin  and fluticasone  discontinued 03/20/23.  Requested Prescriptions  Pending Prescriptions Disp Refills   atorvastatin  (LIPITOR) 20 MG tablet [Pharmacy Med Name: ATORVASTATIN  20 MG TABLET] 90 tablet 2    Sig: TAKE 1 TABLET BY MOUTH EVERY DAY     Cardiovascular:  Antilipid - Statins Failed - 09/26/2023  4:07 PM      Failed - Lipid Panel in normal range within the last 12 months    Cholesterol, Total  Date Value Ref Range Status  12/14/2020 177 100 - 199 mg/dL Final   LDL Chol Calc (NIH)  Date Value Ref Range Status  12/14/2020 77 0 - 99 mg/dL Final   HDL  Date Value Ref Range Status  12/14/2020 84 >39 mg/dL Final   Triglycerides  Date Value Ref Range Status  12/14/2020 88 0 - 149 mg/dL Final         Passed - Patient is not pregnant      Passed - Valid encounter within last 12 months    Recent Outpatient Visits           2 months ago Type 2 diabetes mellitus without complication, without long-term current use of insulin (HCC)   [place] [facility] [place]. [facility] Vicci Sober B, MD   5 months ago Type 2 diabetes mellitus without complication, without long-term current use of insulin (HCC)   [place] [facility] Of [place]. [facility] Fleeta Morris, [place] L, RPH-CPP   6 months ago Type 2 diabetes mellitus with diabetic polyneuropathy, without long-term current use of insulin (HCC)   [place] [facility] Dept Of [place]. [facility] Vicci Sober NOVAK, MD   10 months ago Annual physical exam   [place] [facility] Dept Of [place]. [facility] Vicci Sober NOVAK, MD   1 year ago Chronic cough   [place] [facility] [place] [facility] Dept Of [place]. [facility] Theotis Haze ORN, NP       Future Appointments             In 1 month Vicci Sober NOVAK, MD [facility] [place] - A Dept Of [place].  [facility] [address]             fluticasone  (FLONASE ) 50 MCG/ACT nasal spray [Pharmacy Med Name: FLUTICASONE  PROP 50 MCG SPRAY] 48 mL 0    Sig: SPRAY 2 SPRAYS INTO EACH NOSTRIL EVERY DAY     Ear, Nose, and Throat: Nasal Preparations - Corticosteroids Passed - 09/26/2023  4:07 PM      Passed - Valid encounter within last 12 months    Recent Outpatient Visits           2 months ago Type 2 diabetes mellitus without complication, without long-term current use of insulin (HCC)   [place] [facility] Of [place]. [facility] Vicci Sober B, MD   5 months ago Type 2 diabetes mellitus without complication, without long-term current use of insulin (HCC)   [place] [facility] Dept Of [place]. [facility] Fleeta Morris, [place] L, RPH-CPP   6 months ago Type 2 diabetes mellitus with diabetic polyneuropathy, without long-term current use of insulin (HCC)   [place] [facility] Dept Of [place].  [facility] Vicci Barnie NOVAK, MD   10 months ago Annual physical exam   [place] [facility] Dept Of [place]. [facility] Vicci Barnie NOVAK, MD   1 year ago Chronic cough   [place] [facility] [place] - A Dept Of [place]. [facility] Theotis Haze ORN, NP       Future Appointments             In 1 month Vicci Barnie NOVAK, MD [facility] [place] - A Dept Of [place]. [facility], [place]

## 2023-11-01 ENCOUNTER — Ambulatory Visit: Admitting: Orthopaedic Surgery

## 2023-11-06 ENCOUNTER — Encounter: Payer: Self-pay | Admitting: Radiology

## 2023-11-21 ENCOUNTER — Ambulatory Visit: Admitting: Internal Medicine

## 2023-11-29 ENCOUNTER — Other Ambulatory Visit: Payer: Self-pay

## 2023-11-29 ENCOUNTER — Other Ambulatory Visit (INDEPENDENT_AMBULATORY_CARE_PROVIDER_SITE_OTHER): Payer: Self-pay

## 2023-11-29 ENCOUNTER — Ambulatory Visit (INDEPENDENT_AMBULATORY_CARE_PROVIDER_SITE_OTHER): Admitting: Orthopaedic Surgery

## 2023-11-29 ENCOUNTER — Encounter: Payer: Self-pay | Admitting: Orthopaedic Surgery

## 2023-11-29 DIAGNOSIS — M25561 Pain in right knee: Secondary | ICD-10-CM | POA: Diagnosis not present

## 2023-11-29 DIAGNOSIS — Z96641 Presence of right artificial hip joint: Secondary | ICD-10-CM | POA: Diagnosis not present

## 2023-11-29 DIAGNOSIS — M25512 Pain in left shoulder: Secondary | ICD-10-CM | POA: Diagnosis not present

## 2023-11-29 DIAGNOSIS — M25562 Pain in left knee: Secondary | ICD-10-CM

## 2023-11-29 DIAGNOSIS — G8929 Other chronic pain: Secondary | ICD-10-CM

## 2023-11-29 MED ORDER — TRAMADOL HCL 50 MG PO TABS: 50.0000 mg | ORAL_TABLET | Freq: Two times a day (BID) | ORAL | 0 refills | Status: AC | PRN

## 2023-11-29 MED ORDER — TIZANIDINE HCL 2 MG PO TABS: 2.0000 mg | ORAL_TABLET | Freq: Three times a day (TID) | ORAL | 0 refills | Status: AC | PRN

## 2023-11-29 NOTE — Progress Notes (Signed)
 The patient is a 73 year old female who we replaced her right hip about 2 years or more ago secondary to severe arthritis.  2 days ago she had a hard mechanical fall and she feels like she injured the right hip but more of her back as well as her left shoulder and right knee but she really wants her right hip checked out today.  She is seen with an interpreter.  The right hip does move smoothly and fluid with just a little bit of discomfort.  Most of her pain seems to be in the low back to the right side.  She does have some left shoulder pain and bilateral knee pain but none of this seems severe.  Again she really wants us  to just focus on her hip today.  An AP pelvis and lateral the right hip shows a well-seated right total hip arthroplasty with no complicating features.  This gave her reassurance.  I will send in some Zanaflex  and tramadol  for pain and muscle spasms.  If things worsen she knows to reach out and come back and see us .  Follow-up otherwise can be as needed.

## 2023-12-01 ENCOUNTER — Other Ambulatory Visit: Payer: Self-pay | Admitting: Internal Medicine

## 2023-12-01 DIAGNOSIS — E1169 Type 2 diabetes mellitus with other specified complication: Secondary | ICD-10-CM

## 2023-12-06 ENCOUNTER — Other Ambulatory Visit: Payer: Self-pay | Admitting: Internal Medicine

## 2023-12-06 DIAGNOSIS — E1169 Type 2 diabetes mellitus with other specified complication: Secondary | ICD-10-CM

## 2023-12-11 ENCOUNTER — Telehealth: Payer: Self-pay | Admitting: Internal Medicine

## 2023-12-11 NOTE — Telephone Encounter (Signed)
 Let pt know that her son Mr. Noberto, who is also my pt and whom I saw 12/08/2023, reported that she is having some muscle aches with the Atorvastatin . He wanted me to change her to a different cholesterol med called Rosuvastatin to see if she tolerates better. I am willing to do so but just wanted to check with her first and get her okay to switch. If she is agreeable she should stop the Atorvastain and I will send new prescription.

## 2024-01-09 NOTE — Progress Notes (Signed)
 "  Name: Norma Clay  MRN/ DOB: [id number], 21-Jun-1950    Age/ Sex: 74 y.o., female     PCP: Norma Barnie NOVAK, MD   Reason for Endocrinology Evaluation: Post-surgical hypoparathyroidism     Initial Endocrinology Clinic Visit: 12/08/17    PATIENT IDENTIFIER: Norma Clay is a 74 y.o., female with a past medical history of Total thyroidectomy and T2DM. She has followed with [facility] since 12/08/2017 for consultative assistance with management of her Post-surgical hypoparathyroidism.   HISTORICAL SUMMARY:  She  moved from [place] in 2019. 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 She denies history of thyroid  cancer. Pt noted hand spasms following 2nd surgery and has been on Calcium  and Calcitriol  since. Historically has compliance issues which results in serum calcium  fluctuations.  24-hr urine collection normal 06/2018  She is s/p right thyroid  nodule FNA while in [place]  with benign cytology per patient 08/2021   DIABETES HISTORY: She has been diagnosed with diabetes many years ago.  She has been on metformin , she used to be on glimepiride , Jardiance  was started in 2022. Her A1c has ranged from 6.9% in 2020, peaking at 10.0% in 2018  Due to hypoglycemia I have switched glimepiride  to repaglinide  12/2022  SUBJECTIVE:   Today (01/10/2024):  Norma Clay is here for a follow up on hypocalcemia, DM and hypothyroidism.     She is accompanied by her daughter    Patient follows with [facility] for glaucoma Patient has been following up with [facility] for dental health Patient follows with orthopedics for knee pain as well as shoulder pain  She checks glucose daily, no glucose data today   Has chronic constipation and indigestion  Has palpitations    Medication: Calcium  carbonate (oyster shell) 500 mg, 1 tabs TID Calcitriol  0.25 MCG, 2 caps daily Vitamin D3  400 IU daily Levothyroxine  88 mcg daily  Jardiance  25 mg daily Metformin  850 twice daily Repaglinide  0.5 mg, 1 tab before Breakfast - not taking        DIABETIC COMPLICATIONS: Microvascular complications:   Denies:  Last Eye Exam: Completed 04/12/2023  Macrovascular complications:   Denies: CAD, CVA, PVD    HISTORY:  Past Medical History:  Past Medical History:  Diagnosis Date   Diabetes mellitus without complication (HCC)    Glaucoma    Hypertension    Osteoarthritis of right hip 05/16/2016   Posterior capsular opacification of both eyes, 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   Primary osteoarthritis of right hip 05/06/2020   Past Surgical History:  Past Surgical History:  Procedure Laterality Date   ABDOMINAL HYSTERECTOMY     GLAUCOMA SURGERY     THYROIDECTOMY     TOTAL HIP ARTHROPLASTY Right 06/23/2020   Procedure: RIGHT TOTAL HIP ARTHROPLASTY ANTERIOR APPROACH;  Surgeon: Vernetta Lonni GRADE, MD;  Location: [facility] OR;  Service: Orthopedics;  Laterality: Right;   Social History:  reports that she has never smoked. She has never used smokeless tobacco. She reports that she does not drink alcohol and does not use drugs. Family History: family history includes Diabetes in her mother.   HOME MEDICATIONS: Allergies as of 01/10/2024       Reactions   Clindamycin  Rash   Penicillins Rash   Patient was placed on a form of penicillin by her dentist and developed a rash therefore medication was changed to clindamycin .  Per son, patient is not allergic to clindamycin   Patient was placed on a form of penicillin by her dentist and developed a rash therefore medication was changed to clindamycin .  Per son, patient is not allergic to clindamycin  Patient was placed on a form of penicillin by her dentist and developed a rash therefore medication was changed to clindamycin .  Per son, patient is not allergic to clindamycin     Patient was placed on a form of penicillin by her dentist and developed  a rash therefore medication was changed to clindamycin .  Per son, patient is not allergic to clindamycin         Medication List        Accurate as of January 10, 2024 11:52 AM. If you have any questions, ask your nurse or doctor.          STOP taking these medications    Dexcom G7 Sensor Misc Stopped by: Donell Butts, MD       TAKE these medications    acetaminophen  325 MG tablet Commonly known as: Tylenol  Take 1-2 tablets (325-650 mg total) by mouth every 6 (six) hours as needed for moderate pain.   albuterol  108 (90 Base) MCG/ACT inhaler Commonly known as: VENTOLIN  HFA TAKE 2 PUFFS BY MOUTH EVERY 6 HOURS AS NEEDED FOR WHEEZE OR SHORTNESS OF BREATH   atorvastatin  20 MG tablet Commonly known as: LIPITOR TAKE 1 TABLET BY MOUTH EVERY DAY   brimonidine  0.2 % ophthalmic solution Commonly known as: ALPHAGAN  Place 1 drop into the right eye 2 (two) times daily.   calcitRIOL  0.25 MCG capsule Commonly known as: ROCALTROL  Take 2 capsules (0.5 mcg total) by mouth daily.   calcium  carbonate 1250 (500 Ca) MG chewable tablet Commonly known as: OS-CAL Chew 1 tablet (1,250 mg total) by mouth 3 (three) times daily.   clotrimazole  1 % cream Commonly known as: LOTRIMIN  APPLY TO BOTH FEET AT BASE OF TOES TWICE DAILY   dorzolamide -timolol  2-0.5 % ophthalmic solution Commonly known as: COSOPT  Place 1 drop into both eyes 2 (two) times daily   empagliflozin  25 MG Tabs tablet Commonly known as: Jardiance  Take 1 tablet (25 mg total) by mouth daily before breakfast.   ferrous sulfate  325 (65 FE) MG tablet Take by mouth.   ferrous sulfate  325 (65 FE) MG tablet 1 TAB DAILY X 1 MONTH, THEN 1 TAB ON M/W/F   levothyroxine  88 MCG tablet Commonly known as: SYNTHROID  Take 1 tablet (88 mcg total) by mouth daily.   metFORMIN  850 MG tablet Commonly known as: GLUCOPHAGE  Take 1 tablet (850 mg total) by mouth 2 (two) times daily with a meal.   omeprazole  40 MG capsule Commonly  known as: PRILOSEC Take 1 capsule (40 mg total) by mouth daily.   repaglinide  0.5 MG tablet Commonly known as: PRANDIN  Take 1 tablet (0.5 mg total) by mouth daily before breakfast.   tiZANidine  2 MG tablet Commonly known as: ZANAFLEX  Take 1 tablet (2 mg total) by mouth every 8 (eight) hours as needed for muscle spasms.   traMADol  50 MG tablet Commonly known as: ULTRAM  Take 1-2 tablets (50-100 mg total) by mouth every 12 (twelve) hours as needed.   Travatan  Z 0.004 % Soln ophthalmic solution Generic drug: Travoprost  (BAK Free) Place 1 drop into both eyes nightly   True Metrix Blood Glucose Test test strip Generic drug: glucose blood Use as instructed daily E11.42   valACYclovir 1000 MG tablet Commonly known as: VALTREX Take 1 tablet by mouth daily.   Vitamin D3 10 MCG (400 UNIT) tablet Take 1  tablet (400 Units total) by mouth daily.          OBJECTIVE:   PHYSICAL EXAM: VS:BP 128/76   Ht 5' 5 (1.651 m)   Wt 155 lb (70.3 kg)   BMI 25.79 kg/m    EXAM: General: Pt appears well and is in NAD No thyroid  nodules appreciated  Lungs: Clear with good BS bilat   Heart: Auscultation: RRR.  Extremities: No pretibial edema   Mental Status: Judgment, insight: Intact Mood and affect: No depression, anxiety, or agitation   Dm Foot Exam 06/28/2023   The skin of the feet is intact without sores or ulcerations. The pedal pulses are 1+ on right and 1+ on left. The sensation is decreased to a screening 5.07, 10 gram monofilament bilaterally   DATA REVIEWED:    Latest Reference Range & Units 01/10/24 11:59  Sodium 135 - 146 mmol/L 138  Potassium 3.5 - 5.3 mmol/L 3.8  Chloride 98 - 110 mmol/L 101  CO2 20 - 32 mmol/L 27  Glucose 65 - 99 mg/dL 852 (H)  BUN 7 - 25 mg/dL 25  Creatinine 9.39 - 8.99 mg/dL 9.13  Calcium  8.6 - 10.4 mg/dL 8.4 (L)  BUN/Creatinine Ratio 6 - 22 (calc) SEE NOTE:  eGFR > OR = 60 mL/min/1.71m2 70    Latest Reference Range & Units 01/10/24 11:59   TSH 0.40 - 4.50 mIU/L 1.16  Albumin  MSPROF 3.6 - 5.1 g/dL 4.1       Latest Reference Range & Units 06/28/23 12:06  Sodium 135 - 146 mmol/L 140  Potassium 3.5 - 5.3 mmol/L 3.9  Chloride 98 - 110 mmol/L 103  CO2 20 - 32 mmol/L 28  Glucose 65 - 99 mg/dL 867 (H)  BUN 7 - 25 mg/dL 32 (H)  Creatinine 9.39 - 1.00 mg/dL 9.13  Calcium  8.6 - 10.4 mg/dL 8.5 (L)  BUN/Creatinine Ratio 6 - 22 (calc) 37 (H)  eGFR > OR = 60 mL/min/1.72m2 71    Latest Reference Range & Units 06/28/23 12:06  Vitamin D , 25-Hydroxy 30 - 100 ng/mL 47    Latest Reference Range & Units 06/28/23 12:06  TSH 0.40 - 4.50 mIU/L 0.77      Thyroid  ultrasound 08/03/2021 @[facility]  There is a 2 cm TR 4 nodule in the midpole region right lobe.  Recommend FNA There is 11 mm TR 3 nodule in the thyroid  isthmus Left lobe absent    Thyroid  ULtrasound 06/24/2022 FINDINGS: Isthmus: Redemonstrated nodular tissue within isthmic resection bed measuring 1.8 x 1.1 x 0.5 cm, previously, 2.2 x 1.3 x 0.3 cm when compared to the 12/2019 examination.   Right lobe: Redemonstrated nodular tissue within the right lobectomy resection bed measuring 2.0 x 1.6 x 1.4 cm, previously, 2.2 x 1.4 x 1.3 cm, when compared to the 12/2019 examination   Left lobe: No definitive residual soft tissue is seen within the left lobectomy resection bed.   _________________________________________________________   No regional cervical lymphadenopathy.   IMPRESSION: Stable sequela of presumed subtotal thyroidectomy with residual nodular tissue within the isthmic and right lobe resection beds, grossly unchanged compared to the 12/2019 examination and without definitive evidence of progressive residual or recurrent disease.       Old records , labs and images have been reviewed.    ASSESSMENT / PLAN / RECOMMENDATIONS:   Surgical Hypoparathyroidism :   -Patient asymptomatic -GFR remains normal -Serum calcium  acceptable  level -24-hour urine collection was acceptable at 213 on 06/03/2020, this was  166 in 2020 - I did entertained the idea of Yorvipath  , but patient is skeptical  - No changes    Medications:  Continue Calcium  Carbonate 500 mg 1 tabs TID Continue  Calcitriol  0.25 mcg ( 2 Caps ) daily   Continue Vitamin D3 400 iu daily     2. Postsurgical hypothyroidism :  - Patient is clinically euthyroid - TFTs within normal range   Medication  Continue levothyroxine  88 mcg daily     3. Multinodular Goiter:  -S/p left thyroidectomy while living in [place] due to benign pathology -Thyroid  ultrasound 12/17/2020 showed isthmic and a right thyroid  nodule meeting criteria for 1 year follow-up -She had a repeat thyroid  ultrasound 08/2021 while living in [place] , she is s/p benign FNA of the right nodule 08/2021 per patient -Repeat thyroid  ultrasound shows stability from 2021   4. T2DM, Sub-optimally Controlled , A1c 7.6 %  - A1c is trending up, I suspect this is due to lack of repaglinide  intake over the past month.  A new refill was sent today -No glucose data, patient advised to bring glucose meter in the future   medication Take repaglinide  0.5 mg, 1 tablet before breakfast Continue metformin  850 mg twice daily Continue  Jardiance  25 mg daily   F/u in 6 months      Signed electronically by: Stefano Redgie Butts, MD  [facility]  [facility] [address] [place] [place], [place] [address] Phone: [phone] FAX: [phone]      CC: Norma Barnie NOVAK, MD [address] [place] [address] [place] [address] Phone: [phone]  Fax: [phone]   Return to Endocrinology clinic as below: Future Appointments  Date Time Provider Department Center  01/19/2024  9:50 AM Norma Barnie NOVAK, MD [facility] [address]  03/05/2024 11:50 AM [facility] VISIT [facility] [address] [place]     "

## 2024-01-10 ENCOUNTER — Other Ambulatory Visit

## 2024-01-10 ENCOUNTER — Encounter: Payer: Self-pay | Admitting: Internal Medicine

## 2024-01-10 ENCOUNTER — Ambulatory Visit: Admitting: Internal Medicine

## 2024-01-10 VITALS — BP 128/76 | Ht 65.0 in | Wt 155.0 lb

## 2024-01-10 DIAGNOSIS — Z7984 Long term (current) use of oral hypoglycemic drugs: Secondary | ICD-10-CM | POA: Diagnosis not present

## 2024-01-10 DIAGNOSIS — E892 Postprocedural hypoparathyroidism: Secondary | ICD-10-CM | POA: Diagnosis not present

## 2024-01-10 DIAGNOSIS — E890 Postprocedural hypothyroidism: Secondary | ICD-10-CM

## 2024-01-10 DIAGNOSIS — E119 Type 2 diabetes mellitus without complications: Secondary | ICD-10-CM

## 2024-01-10 LAB — POCT GLYCOSYLATED HEMOGLOBIN (HGB A1C): Hemoglobin A1C: 7.6 % — AB (ref 4.0–5.6)

## 2024-01-10 MED ORDER — METFORMIN HCL 850 MG PO TABS: 850.0000 mg | ORAL_TABLET | Freq: Two times a day (BID) | ORAL | 3 refills | Status: AC

## 2024-01-10 MED ORDER — EMPAGLIFLOZIN 25 MG PO TABS: 25.0000 mg | ORAL_TABLET | Freq: Every day | ORAL | 3 refills | Status: DC

## 2024-01-10 MED ORDER — REPAGLINIDE 0.5 MG PO TABS: 0.5000 mg | ORAL_TABLET | Freq: Every day | ORAL | 3 refills | Status: AC

## 2024-01-10 NOTE — Patient Instructions (Signed)
 Take Repaglinide  0.5 mg , 1 tablet BEFORE Breakfast daily  Take  Metformin  850 mg  1 tablet before Breakfast and 1 tablet before Supper  Take Jardiance  25 mg, 1 tablet before breakfast     Continue Calcium  Carbonate  1 tablet three times daily for now  Continue Calcitriol  two capsules a day  Continue Over the counter Vitamin D3 400 iu daily  Continue Levothyroxine  88 mcg, 1 tablet before Breakfast    ????? ?????????? ?.? ???? ??? ???? ??? ??????? ??????. ????? ????????? ??? ???? ??? ???? ??? ??????? ???? ???? ??? ??????. ????? ???????? ?? ???? ??? ???? ??? ???????.  ????? ?? ????? ??????? ?????????? ??? ???? ???? ???? ?????? ?? ????? ??????. ????? ?? ????? ??????????? ???????? ??????. ????? ?? ????? ??????? ?? ??? ???? ????? ??????? ???? ???? ????. ????? ?? ????? ???????????? ?? ?????????? ??? ???? ??? ???????.

## 2024-01-11 ENCOUNTER — Ambulatory Visit: Payer: Self-pay | Admitting: Internal Medicine

## 2024-01-11 LAB — BASIC METABOLIC PANEL WITH GFR
BUN: 25 mg/dL (ref 7–25)
CO2: 27 mmol/L (ref 20–32)
Calcium: 8.4 mg/dL — ABNORMAL LOW (ref 8.6–10.4)
Chloride: 101 mmol/L (ref 98–110)
Creat: 0.86 mg/dL (ref 0.60–1.00)
Glucose, Bld: 147 mg/dL — ABNORMAL HIGH (ref 65–99)
Potassium: 3.8 mmol/L (ref 3.5–5.3)
Sodium: 138 mmol/L (ref 135–146)
eGFR: 70 mL/min/1.73m2

## 2024-01-11 LAB — ALBUMIN: Albumin: 4.1 g/dL (ref 3.6–5.1)

## 2024-01-11 LAB — TSH: TSH: 1.16 m[IU]/L (ref 0.40–4.50)

## 2024-01-11 MED ORDER — LEVOTHYROXINE SODIUM 88 MCG PO TABS: 88.0000 ug | ORAL_TABLET | Freq: Every day | ORAL | 3 refills | Status: AC

## 2024-01-19 ENCOUNTER — Ambulatory Visit: Admitting: Internal Medicine

## 2024-01-19 ENCOUNTER — Other Ambulatory Visit: Payer: Self-pay | Admitting: Internal Medicine

## 2024-01-23 ENCOUNTER — Ambulatory Visit

## 2024-02-22 ENCOUNTER — Ambulatory Visit: Admitting: Internal Medicine

## 2024-03-05 ENCOUNTER — Ambulatory Visit

## 2024-07-10 ENCOUNTER — Ambulatory Visit: Admitting: Internal Medicine
